# Patient Record
Sex: Male | Born: 1945 | Race: White | Hispanic: No | Marital: Married | Smoking: Former smoker
Health system: Southern US, Community
[De-identification: ages and names within clinical notes are randomized; demographics above are authoritative.]

## PROBLEM LIST (undated history)

## (undated) DIAGNOSIS — H269 Unspecified cataract: Secondary | ICD-10-CM

## (undated) DIAGNOSIS — E669 Obesity, unspecified: Secondary | ICD-10-CM

## (undated) DIAGNOSIS — I10 Essential (primary) hypertension: Secondary | ICD-10-CM

## (undated) DIAGNOSIS — T7840XA Allergy, unspecified, initial encounter: Secondary | ICD-10-CM

## (undated) DIAGNOSIS — N189 Chronic kidney disease, unspecified: Secondary | ICD-10-CM

## (undated) DIAGNOSIS — I714 Abdominal aortic aneurysm, without rupture, unspecified: Secondary | ICD-10-CM

## (undated) DIAGNOSIS — I2510 Atherosclerotic heart disease of native coronary artery without angina pectoris: Secondary | ICD-10-CM

## (undated) DIAGNOSIS — I4891 Unspecified atrial fibrillation: Principal | ICD-10-CM

## (undated) DIAGNOSIS — I219 Acute myocardial infarction, unspecified: Secondary | ICD-10-CM

## (undated) HISTORY — DX: Abdominal aortic aneurysm, without rupture, unspecified: I71.40

## (undated) HISTORY — DX: Essential (primary) hypertension: I10

## (undated) HISTORY — DX: Atherosclerotic heart disease of native coronary artery without angina pectoris: I25.10

## (undated) HISTORY — DX: Allergy, unspecified, initial encounter: T78.40XA

## (undated) HISTORY — DX: Abdominal aortic aneurysm, without rupture: I71.4

## (undated) HISTORY — DX: Acute myocardial infarction, unspecified: I21.9

## (undated) HISTORY — DX: Obesity, unspecified: E66.9

## (undated) HISTORY — DX: Chronic kidney disease, unspecified: N18.9

## (undated) HISTORY — DX: Unspecified cataract: H26.9

## (undated) HISTORY — DX: Unspecified atrial fibrillation: I48.91

---

## 1979-05-11 HISTORY — PX: SKIN GRAFT: SHX250

## 1997-11-25 ENCOUNTER — Ambulatory Visit: Admission: RE | Admit: 1997-11-25 | Discharge: 1997-11-25 | Payer: Self-pay | Admitting: Gastroenterology

## 1999-05-11 DIAGNOSIS — I219 Acute myocardial infarction, unspecified: Secondary | ICD-10-CM

## 1999-05-11 HISTORY — PX: CORONARY ANGIOPLASTY WITH STENT PLACEMENT: SHX49

## 1999-05-11 HISTORY — DX: Acute myocardial infarction, unspecified: I21.9

## 1999-05-16 ENCOUNTER — Inpatient Hospital Stay (HOSPITAL_COMMUNITY): Admission: EM | Admit: 1999-05-16 | Discharge: 1999-05-19 | Payer: Self-pay | Admitting: Emergency Medicine

## 1999-05-16 ENCOUNTER — Encounter: Payer: Self-pay | Admitting: Internal Medicine

## 2002-06-19 ENCOUNTER — Encounter: Admission: RE | Admit: 2002-06-19 | Discharge: 2002-09-17 | Payer: Self-pay | Admitting: Family Medicine

## 2004-05-10 HISTORY — PX: CARDIAC CATHETERIZATION: SHX172

## 2004-05-10 HISTORY — PX: HERNIA REPAIR: SHX51

## 2005-01-14 ENCOUNTER — Emergency Department (HOSPITAL_COMMUNITY): Admission: EM | Admit: 2005-01-14 | Discharge: 2005-01-14 | Payer: Self-pay | Admitting: Emergency Medicine

## 2005-02-01 ENCOUNTER — Ambulatory Visit: Payer: Self-pay | Admitting: Internal Medicine

## 2005-02-02 ENCOUNTER — Ambulatory Visit: Payer: Self-pay

## 2005-02-19 ENCOUNTER — Ambulatory Visit: Payer: Self-pay | Admitting: Cardiology

## 2005-02-19 ENCOUNTER — Ambulatory Visit: Payer: Self-pay

## 2005-02-25 ENCOUNTER — Ambulatory Visit: Payer: Self-pay | Admitting: Cardiology

## 2005-02-25 ENCOUNTER — Inpatient Hospital Stay (HOSPITAL_BASED_OUTPATIENT_CLINIC_OR_DEPARTMENT_OTHER): Admission: RE | Admit: 2005-02-25 | Discharge: 2005-02-25 | Payer: Self-pay | Admitting: Cardiology

## 2005-03-02 ENCOUNTER — Ambulatory Visit: Payer: Self-pay | Admitting: Internal Medicine

## 2005-04-06 ENCOUNTER — Encounter: Admission: RE | Admit: 2005-04-06 | Discharge: 2005-04-06 | Payer: Self-pay | Admitting: Surgery

## 2005-04-08 ENCOUNTER — Ambulatory Visit (HOSPITAL_BASED_OUTPATIENT_CLINIC_OR_DEPARTMENT_OTHER): Admission: RE | Admit: 2005-04-08 | Discharge: 2005-04-08 | Payer: Self-pay | Admitting: Surgery

## 2005-04-08 ENCOUNTER — Ambulatory Visit (HOSPITAL_COMMUNITY): Admission: RE | Admit: 2005-04-08 | Discharge: 2005-04-08 | Payer: Self-pay | Admitting: Surgery

## 2005-07-21 ENCOUNTER — Ambulatory Visit: Payer: Self-pay | Admitting: Cardiology

## 2006-07-21 ENCOUNTER — Ambulatory Visit: Payer: Self-pay | Admitting: Cardiology

## 2007-07-27 ENCOUNTER — Ambulatory Visit: Payer: Self-pay | Admitting: Cardiology

## 2008-07-06 DIAGNOSIS — E785 Hyperlipidemia, unspecified: Secondary | ICD-10-CM | POA: Insufficient documentation

## 2008-07-06 DIAGNOSIS — I2510 Atherosclerotic heart disease of native coronary artery without angina pectoris: Secondary | ICD-10-CM | POA: Insufficient documentation

## 2008-07-06 DIAGNOSIS — I10 Essential (primary) hypertension: Secondary | ICD-10-CM | POA: Insufficient documentation

## 2008-07-08 ENCOUNTER — Ambulatory Visit: Payer: Self-pay | Admitting: Cardiology

## 2009-06-16 ENCOUNTER — Encounter: Payer: Self-pay | Admitting: Cardiology

## 2009-07-11 ENCOUNTER — Ambulatory Visit: Payer: Self-pay | Admitting: Cardiology

## 2010-06-09 NOTE — Assessment & Plan Note (Signed)
Summary: 1 YR F/U   Visit Type:  Follow-up Primary Provider:  Hamricks  CC:  follow up.  History of Present Illness: Patient is 65 years old and returns for followup management of CAD. In 2001 he had an anterior wall MI treated with a bare-metal stent to the LAD. He underwent catheterization in 2006 and the stent was patent. An 80% narrowing the diagonal branch. His ejection fraction was 50%. He has done quite well since that time. He's had no recent chest pain shortness breath or palpitations.  His other problems include hypertension, hyperlipidemia, and diabetes., he recently had lab work at his primary care physician's office and his HDL was 47, his LDL was 53, his total cholesterol was 111. His hemoglobin A1c was 6.3.  He and his wife take care of their 58-year-old grandson and they're expecting another grandchild and may be involved with her care as well. He is retired from [employer].  Current Medications (verified): 1)  Simvastatin 40 Mg Tabs (Simvastatin) .... Take One Tablet By Mouth Daily At Bedtime 2)  Ramipril 10 Mg Caps (Ramipril) .... Take One Capsule By Mouth Daily 3)  Aspirin 81 Mg Tbec (Aspirin) .... Take One Tablet By Mouth Daily 4)  Nitrostat 0.4 Mg Subl (Nitroglycerin) .Marland Kitchen.. 1 Tablet Under Tongue At Onset of Chest Pain; You May Repeat Every 5 Minutes For Up To 3 Doses. 5)  Glipizide-Metformin Hcl 2.5-500 Mg Tabs (Glipizide-Metformin Hcl) .... 2 Tabs Two Times A Day 6)  Actos 45 Mg Tabs (Pioglitazone Hcl) .... Once Daily 7)  Levemir Flexpen 100 Unit/ml Soln (Insulin Detemir) .... As Directed  Allergies (verified): No Known Drug Allergies  Past History:  Past Medical History: Reviewed history from 07/06/2008 and no changes required. 1. Coronary artery disease status post anterior wall myocardial     infarction in 2001 treated with bare-metal stent to the LAD, now     stable with primarily nonobstructive disease at catheterization in     2006. 2. Estimated fraction  50%. 3. Hyperlipidemia. 4. Diabetes. 5. Hypertension.   Review of Systems       ROS is negative except as outlined in HPI.   Vital Signs:  Patient profile:   65 year old male Height:      74 inches Weight:      272 pounds BMI:     35.05 Pulse rate:   59 / minute BP sitting:   132 / 74  (left arm) Cuff size:   regular  Vitals Entered By: Hardin Negus, RMA (July 11, 2009 3:09 PM)  Physical Exam  Additional Exam:  Gen. Well-nourished, in no distress   Neck: No JVD, thyroid not enlarged, no carotid bruits Lungs: No tachypnea, clear without rales, rhonchi or wheezes Cardiovascular: Rhythm regular, PMI not displaced,  heart sounds  normal, no murmurs or gallops, no peripheral edema, pulses normal in all 4 extremities. Abdomen: BS normal, abdomen soft and non-tender without masses or organomegaly, no hepatosplenomegaly. MS: No deformities, no cyanosis or clubbing   Neuro:  No focal sns   Skin:  no lesions    Impression & Recommendations:  Problem # 1:  CAD, NATIVE VESSEL (ICD-414.01) He had an anterior MI in 2001 treated with a bare-metal stent to the LAD and he had mostly nonobstructive disease at 2006. His ejection fraction is 50%. He's had no recent chest pain and this problem appears stable.  His weight is up and talk to him about working on getting it back down.  His updated  medication list for this problem includes:    Ramipril 10 Mg Caps (Ramipril) .Marland Kitchen... Take one capsule by mouth daily    Aspirin 81 Mg Tbec (Aspirin) .Marland Kitchen... Take one tablet by mouth daily    Nitrostat 0.4 Mg Subl (Nitroglycerin) .Marland Kitchen... 1 tablet under tongue at onset of chest pain; you may repeat every 5 minutes for up to 3 doses.  Orders: EKG w/ Interpretation (93000)  Problem # 2:  HYPERTENSION, BENIGN (ICD-401.1)  Tappears well controlled on current medications.   His updated medication list for this problem includes:    Ramipril 10 Mg Caps (Ramipril) .Marland Kitchen... Take one capsule by mouth daily     Aspirin 81 Mg Tbec (Aspirin) .Marland Kitchen... Take one tablet by mouth daily  His updated medication list for this problem includes:    Ramipril 10 Mg Caps (Ramipril) .Marland Kitchen... Take one capsule by mouth daily    Aspirin 81 Mg Tbec (Aspirin) .Marland Kitchen... Take one tablet by mouth daily  Problem # 3:  HYPERLIPIDEMIA-MIXED (ICD-272.4) His recent lipid profile is excellent. We will continue his current therapy.   His updated medication list for this problem includes:    Simvastatin 40 Mg Tabs (Simvastatin) .Marland Kitchen... Take one tablet by mouth daily at bedtime  His updated medication list for this problem includes:    Simvastatin 40 Mg Tabs (Simvastatin) .Marland Kitchen... Take one tablet by mouth daily at bedtime  Patient Instructions: 1)  Your physician wants you to follow-up in: 1 year followup with Dr. Clifton James.  You will receive a reminder letter in the mail two months in advance. If you don't receive a letter, please call our office to schedule the follow-up appointment.

## 2010-07-20 ENCOUNTER — Ambulatory Visit (INDEPENDENT_AMBULATORY_CARE_PROVIDER_SITE_OTHER): Payer: BC Managed Care – PPO | Admitting: Cardiovascular Disease

## 2010-07-20 ENCOUNTER — Encounter: Payer: Self-pay | Admitting: Cardiovascular Disease

## 2010-07-20 DIAGNOSIS — I10 Essential (primary) hypertension: Secondary | ICD-10-CM

## 2010-07-20 DIAGNOSIS — I2510 Atherosclerotic heart disease of native coronary artery without angina pectoris: Secondary | ICD-10-CM

## 2010-07-28 NOTE — Assessment & Plan Note (Signed)
Summary: F1Y/LG   Visit Type:  Follow-up Primary Provider:  Burnell Blanks  CC:  no cardiac complaints..  History of Present Illness: 65 yo WM with history of CAD, HTN, hyperlipidemia and DM who is here today for routine cardiac follow up. He has been followed in the past by Dr. Juanda Chance.  In 2001 he had an anterior wall MI treated with a bare-metal stent to the LAD. He underwent catheterization in 2006 and the stent was patent. An 80% narrowing the diagonal branch. His ejection fraction was 50%.  He is here today for follow up. He is doing well. He has had no chest pain or SOB. No palpitations, near syncope or syncope.      Problems Prior to Update: 1)  Hyperlipidemia-mixed  (ICD-272.4) 2)  Hypertension, Benign  (ICD-401.1) 3)  Cad, Native Vessel  (ICD-414.01)  Current Medications (verified): 1)  Simvastatin 40 Mg Tabs (Simvastatin) .... Take One Tablet By Mouth Daily At Bedtime 2)  Ramipril 10 Mg Caps (Ramipril) .... Take One Capsule By Mouth Daily 3)  Aspirin 81 Mg Tbec (Aspirin) .... Take One Tablet By Mouth Daily 4)  Nitrostat 0.4 Mg Subl (Nitroglycerin) .Marland Kitchen.. 1 Tablet Under Tongue At Onset of Chest Pain; You May Repeat Every 5 Minutes For Up To 3 Doses. 5)  Glipizide-Metformin Hcl 2.5-500 Mg Tabs (Glipizide-Metformin Hcl) .... 2 Tabs Two Times A Day 6)  Actos 30 Mg Tabs (Pioglitazone Hcl) .... 1/2 Tablet Every Other Day 7)  Levemir Flexpen 100 Unit/ml Soln (Insulin Detemir) .... 40 Units Daily  Allergies: No Known Drug Allergies  Past History:  Past Medical History: 1. Coronary artery disease status post anterior wall myocardial     infarction in 2001 treated with bare-metal stent to the LAD, now     stable with primarily nonobstructive disease at catheterization in     2006. 2. Diabetes. 3. Hypertension.  Past Surgical History: Skin grafting  Family History: Father-deceased, age 42 heart problems Mother-deceased age 41  Lou Gehrigs disease 2 sisters alive. One  with DM.  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   Married. He and his wife are taking care of their 44-1/2-year-old grandson everyday which has been quite a Personal assistant. 3 children No tobacco currently. History of pipe smoking, none since 2001. No alcohol NO drugs  Review of Systems  The patient denies fatigue, malaise, fever, weight gain/loss, vision loss, decreased hearing, hoarseness, chest pain, palpitations, shortness of breath, prolonged cough, wheezing, sleep apnea, coughing up blood, abdominal pain, blood in stool, nausea, vomiting, diarrhea, heartburn, incontinence, blood in urine, muscle weakness, joint pain, leg swelling, rash, skin lesions, headache, fainting, dizziness, depression, anxiety, enlarged lymph nodes, easy bruising or bleeding, and environmental allergies.    Vital Signs:  Patient profile:   65 year old male Height:      74 inches Weight:      265 pounds BSA:     2.45 Resp:     14 per minute BP sitting:   122 / 70  (left arm)  Vitals Entered By: Laurance Flatten CMA (July 20, 2010 10:22 AM) CC: no cardiac complaints.   Physical Exam  General:  General: Well developed, well nourished, NAD HEENT: OP clear, mucus membranes moist SKIN: warm, dry Neuro: No focal deficits Musculoskeletal: Muscle strength 5/5 all ext Psychiatric: Mood and affect normal Neck: No JVD, no carotid bruits, no thyromegaly, no lymphadenopathy. Lungs:Clear bilaterally, no wheezes, rhonci,  crackles CV: RRR no murmurs, gallops rubs Abdomen: soft, NT, ND, BS present Extremities: No edema, pulses 2+.    EKG  Procedure date:  07/20/2010  Findings:      Sinus rhythm, rate 60 bpm.   Impression & Recommendations:  Problem # 1:  CAD, NATIVE VESSEL (ICD-414.01) Stable. No changes. No beta blocker secondary to bradycardia.   His updated medication list for this problem includes:    Ramipril 10 Mg Caps (Ramipril) .Marland Kitchen... Take  one capsule by mouth daily    Aspirin 81 Mg Tbec (Aspirin) .Marland Kitchen... Take one tablet by mouth daily    Nitrostat 0.4 Mg Subl (Nitroglycerin) .Marland Kitchen... 1 tablet under tongue at onset of chest pain; you may repeat every 5 minutes for up to 3 doses.  Problem # 2:  HYPERTENSION, BENIGN (ICD-401.1) Stable.   His updated medication list for this problem includes:    Ramipril 10 Mg Caps (Ramipril) .Marland Kitchen... Take one capsule by mouth daily    Aspirin 81 Mg Tbec (Aspirin) .Marland Kitchen... Take one tablet by mouth daily  Patient Instructions: 1)  Your physician recommends that you schedule a follow-up appointment in: 12 months.

## 2010-09-22 NOTE — Assessment & Plan Note (Signed)
[facility]                            CARDIOLOGY OFFICE NOTE   NAME:[name], [name]                       MRN:          [id number]  DATE:07/27/2007                            DOB:          [birth year]    PRIMARY CARE PHYSICIAN:  Lianne Bushy, M.D.   CLINICAL HISTORY:  [name] is 65 years old and returns for follow-up  management of coronary heart disease.  He had an anterior wall  myocardial infarction in 2001 treated with a bare-metal stent to LAD and  had nonobstructive disease at catheterization in 2006 with exception of  80% lesion in diagonal branch.  His ejection fraction was 50%.   He been doing quite well, has had no symptoms of chest pain, shortness  breath, or palpitations.  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   His past medical history is significant for hypertension,  hyperlipidemia, and diabetes.  He had lipid profile and hemoglobin A1c  recently by [name] which were quite good.   CURRENT MEDICATIONS:  Include:  1. Altace 10 mg daily.  2. Aspirin.  3. Actos.  4. Simvastatin 40 mg daily.  5. Januvia.  6. Levemir.  7. Metformin.   On examination today, the blood pressure was 112/67, pulse 59 and  regular.  There is no venous distension.  The carotid pulses were full without  bruits.  CHEST:  Was clear.  CARDIAC:  Rhythm was regular.  I could hear no murmurs or gallops.  ABDOMEN:  Was soft without organomegaly.  Peripheral pulses were full. There was no peripheral edema.   IMPRESSION:  1. Coronary artery disease status post anterior wall myocardial      infarction in 2001 treated with bare-metal stent to the LAD, now      stable with primarily nonobstructive disease at catheterization in      2006.  2. Estimated fraction 50%.  3. Hyperlipidemia.  4. Diabetes.  5. Hypertension.   RECOMMENDATIONS:  I think [name] is doing quite well.  His risk  factors appear to be under good  control with exception of his weight.  I  encouraged him to cut down on his weight  further and talked to him and reinforced the importance of a low-  carbohydrate diet.  Will plan to see him back in follow-up in a year.     Bruce Elvera Lennox Juanda Chance, MD, [facility]  Electronically Signed    BRB/MedQ  DD: 07/27/2007  DT: 07/28/2007  Job #: [id number]

## 2010-09-22 NOTE — Assessment & Plan Note (Signed)
[facility]                            CARDIOLOGY OFFICE NOTE   NAME:Malecha, BERTRUM HELMSTETTER                       MRN:          [id number]  DATE:07/08/2008                            DOB:          18-Aug-1945    PRIMARY CARE PHYSICIAN:  Burnell Blanks, MD, with [facility] in [place].   CLINICAL HISTORY:  Mr. Simkin is 65 years old and returned for followup  management of his coronary heart disease.  He had an anterior wall  infarction in 2001 treated with a bare-metal stent to the LAD.  His last  cath was in 2006, at which time he had 80% narrowing in diagonal branch  and nonobstructive disease elsewhere.  His ejection fraction is 50%.   He has had no recent chest pain, shortness of breath, or palpitations.   His past medical history is significant for hypertension,  hyperlipidemia, and diabetes.  He said his recent hemoglobin A1c was  7.0.   His current medications include:  1. Altace 10 mg daily.  2. Aspirin.  3. Actos.  4. Simvastatin 40 mg daily.  5. Levemir insulin.  6. Metformin.  7. Glipizide.   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  He and his wife are taking care of their 77-1/2-year-old  grandson everyday which has been quite a Personal assistant.   On examination, there was no venous distension.  The carotid pulses were  full without bruits.  Chest was clear.  Cardiac rhythm was regular.  I  could hear no murmurs or gallops.  The abdomen was soft without  organomegaly.  Peripheral pulses were full, and there was no peripheral  edema.   Electrocardiogram showed an old septal infarct.   IMPRESSION:  1. Coronary artery disease status post anterior wall myocardial      infarction in 2001 treated with a bare-metal stent to the left      anterior descending with primarily nonobstructive disease at      catheterization in 2006.  2. Ejection fraction 50%.  3. Hypertension.  4. Hyperlipidemia.  5. Diabetes.   RECOMMENDATIONS:  I think Mr. Seiter is doing well.  I encouraged him to  have regular physical activities.  Also, I encouraged him to cut back on  his weight.  We will plan to see him back in followup in a year.  We  will try to obtain his laboratory studies from Dr. Nathanial Rancher.     Bruce Elvera Lennox Juanda Chance, MD, [facility]  Electronically Signed    BRB/MedQ  DD: 07/08/2008  DT: 07/09/2008  Job #: [id number]

## 2010-09-25 NOTE — Assessment & Plan Note (Signed)
[facility]                            CARDIOLOGY OFFICE NOTE   NAME:[name], [name]                       MRN:          [id number]  DATE:07/21/2006                            DOB:          [birth year]    REFERRING PHYSICIAN:  Lianne Bushy, M.D.   CLINICAL HISTORY:  [name] is 65 years old, and returns for followup  management of his coronary heart disease.  In 2001 he had an anterior  infarction treated with a bare-metal stent to the LAD.  His last  catheterization was in October of 2006, at which time the stent was  widely patent, and he had an 80% lesion in the diagonal branch, and no  other major obstructive disease.  His ejection fraction was 50% with a  mild anterior wall motion abnormality.   He has done quite well, and had no recent chest pain, shortness of  breath, or palpitations.  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  He has had no symptoms  with this.   PAST MEDICAL HISTORY:  Significant for:  1. Hypertension.  2. Hyperlipidemia.  3. Diabetes.  His diabetes has recently gotten under much better      control with adjustment of his medications and the addition of      insulin.   CURRENT MEDICATIONS:  Include Altace, aspirin, Actos, simvastatin,  Januvia, metformin, and insulin.   EXAMINATION:  The blood pressure is 119/68.  Pulse 66 and regular.  There was no venous distention.  The carotid pulses were full without  bruits.  CHEST:  Was clear.  CARDIAC:  Rhythm was regular.  I could hear no murmurs or gallops.  ABDOMEN:  Soft with normal bowel sounds.  Peripheral pulses were full, and there was no peripheral edema.   An EKG was normal.   IMPRESSION:  1. Coronary artery disease status post anterior wall myocardial      infarction in 2001 treated with a bare-metal stent, now stable.  2. Ejection fraction 50%.  3. Hyperlipidemia.  4. Diabetes.  5. Hypertension.   RECOMMENDATIONS:  I think [name] is doing quite well.  I think the  main thing is his blood pressure and lipid profile have been quite good.  I think the main thing he needs to work on is losing weight and  increasing his exercise program.  I encouraged him in this regard.  He  is to  follow up with Dr. Purnell Shoemaker in May, and I asked him to forward the lipid  profile that he gets as part of that evaluation.  I will plan to see him  back in followup in a year.     Bruce Elvera Lennox Juanda Chance, MD, [facility]  Electronically Signed    BRB/MedQ  DD: 07/21/2006  DT: 07/23/2006  Job #: [id number]

## 2010-09-25 NOTE — Cardiovascular Report (Signed)
NAMEMALIK, PAAR NO.:  [id number]   MEDICAL RECORD NO.:  [id number]           PATIENT TYPE:   LOCATION:                                 FACILITY:   PHYSICIAN:  Charlies Constable, M.D. [facility]      DATE OF BIRTH:   DATE OF PROCEDURE:  02/25/2005  DATE OF DISCHARGE:                              CARDIAC CATHETERIZATION   CLINICAL HISTORY:  Mr. Swamy is 65 years old and in 2001 had an anterior  wall myocardial infarction treated with stenting in the proximal LAD.  He  also has hypertension, hyperlipidemia, and diabetes.  He was seen recently  by Ward Givens for preoperative evaluation prior to umbilical hernia repair.  A Cardiolite scan was obtained which suggested possible anterior ischemia.  For this reason he was brought in for catheterization in the outpatient  laboratory.  He has had no chest pain.   PROCEDURE:  The procedure was performed via the right femoral artery using  arterial sheath and 4-French preformed coronary catheters.  A front wall  arterial puncture was performed and Omnipaque contrast was used.  A distal  aortogram was performed to rule out abdominal aortic aneurysm.  Patient  tolerated the procedure well and left the laboratory in satisfactory  condition.   RESULTS:  The aortic pressure was 110/69 with a mean of 87.  Left  ventricular pressure was 110/13.   The left main coronary artery:  Free of significant disease.   Left anterior descending artery:  Gave rise to two septal perforators and a  small diagonal branch.  There was 0% stenosis at the stent site in the  ostium and proximal LAD.  There was 30% and 50% narrowing in the mid LAD.  There was 80% ostial stenosis in a small diagonal branch.   Circumflex artery:  Gave rise to a ramus branch, a marginal branch, and a  posterolateral branch.  These vessels were irregular but there was no  significant obstruction.   Right coronary artery:  Moderate sized vessel.  Gave rise to a conus  branch,  a right ventricular branch, and a large right ventricular branch that  supplied part of the inferior septum.  The right also gave rise to two short  posterior descending branches and a large and two small posterolateral  branches.  There were irregularities in the proximal and mid right coronary  artery and there was 30% narrowing in the mid to distal right coronary  artery.   LEFT VENTRICULOGRAM:  The left ventriculogram performed in the RAO  projection showed good wall motion.  There was mild hypokinesis at the apex.  The estimated ejection fraction was 50%.   DISTAL AORTOGRAM:  A distal aortogram was performed which showed patent  renal arteries.  There was some ectasia of the distal aorta, but no discrete  aneurysm.  There was no major aortoiliac obstruction.   CONCLUSIONS:  Coronary artery disease status post prior anterior wall  myocardial infarction in 2001 treated with bare metal stent with 0% stenosis  at the stent site at the ostium of the left anterior  descending, 50%  narrowing in the mid left anterior descending with 80% ostial narrowing in a  small diagonal branch, no significant obstruction of the circumflex artery,  30% narrowing in the mid to distal right coronary artery, and mild apical  hypokinesis with an estimated ejection fraction of 50%.   RECOMMENDATIONS:  The patient has no source of ischemia.  In view of these  findings I do not think the recent Cardiolite scan has any major  significance.  He has only non-obstructive coronary disease.  Will plan  continued medical management.  I think we can clear him for surgery with Dr.  Jamey Ripa in the near future.  I will plan a groin check in a week and I will  see him back in follow-up in a year.           ______________________________  Charlies Constable, M.D. [facility]     BB/MEDQ  D:  02/25/2005  T:  02/25/2005  Job:  [id number]   cc:   Lianne Bushy, M.D.  Fax: [phone]   Currie Paris, M.D.  [address]  [place]  [place] [address]

## 2010-09-25 NOTE — Op Note (Signed)
NAMEHARGIS, Richard                ACCOUNT NO.:  [id number]   MEDICAL RECORD NO.:  [id number]          PATIENT TYPE:  AMB   LOCATION:  DSC                          FACILITY:  [facility]   PHYSICIAN:  Currie Paris, M.D.DATE OF BIRTH:  03-May-1946   DATE OF PROCEDURE:  04/08/2005  DATE OF DISCHARGE:                                 OPERATIVE REPORT   OFFICE MEDICAL RECORD NUMBER:  [id number]   PREOPERATIVE DIAGNOSIS:  Umbilical hernia.   POSTOPERATIVE DIAGNOSIS:  Umbilical hernia.   OPERATION:  Repair of umbilical hernia with mesh.   SURGEON:  Currie Paris, M.D.   ANESTHESIA:  General.   CLINICAL HISTORY:  Mr. Odonovan is a 65 year old with an umbilical hernia that  was bothering him and he wished to have repaired.   DESCRIPTION OF PROCEDURE:  The patient was seen in the holding area and he  had no further questions.  We confirmed umbilical hernia repair as the  planned procedure.  He was taken to the operating room and after  satisfactory general (LMA) anesthesia had been obtained, the abdomen was  clipped, prepped and draped.  The time-out occurred.   I injected 0.25% plain Marcaine around the umbilical hernia.  The defect  presented a little bit at the top of the umbilicus.  I then made a  curvilinear incision around the base of the umbilicus.  We entered the sac  almost immediately and I elevated the skin off of the sac.  There was  omentum incarcerated through the hernia defect and I cleaned this off and  out of the subcutaneous pocket and reduced it.  We did not really excise the  sac, but just inverted it.  The defect was about 2 cm in diameter.  There  was no bowel that had been out in it as far as I could tell.   I took a piece of 3 x 6-inch Marlex mesh, cut it in half, folded it into a  shape of a cone and placed it into the defect and then closed the defect  with 3 sutures of 0 Prolene, incorporating the broad side or the top of the  cone with the mesh.  The  sutures tied down easily and with no tension.   Everything appeared to be dry.  Additional local was infiltrated and the  incision closed with some 3-0 Vicryl followed by 4-0 Monocryl subcuticular  and Dermabond.  Some cotton balls with mineral oil were placed in the  umbilicus to keep its shape.   The patient tolerated the procedure well.  There were no operative  complications.  All counts were correct.      Currie Paris, M.D.  Electronically Signed     CJS/MEDQ  D:  04/08/2005  T:  04/09/2005  Job:  [id number]   cc:   Lianne Bushy, M.D.  Fax: [phone]   Charlies Constable, M.D. [facility]  [address]  [address]  [place]  [place] [address]

## 2010-09-25 NOTE — Discharge Summary (Signed)
[place]  Patient:    [name]                        MRN: [id number] Adm. Date:  [id number] Disc. Date: 05/19/99 Attending:  Nathen May Dictator:   Joellyn Rued, P.A.C. CC:         Veneda Melter, M.D. [facility]             Rosana Fret, M.D., [place], [place]                  Referring Physician Discharge [place]  DATE OF BIRTH:  Apr 20, 1946.  ADMITTING PHYSICIAN:  Nathen May, M.D.  SUMMARY OF HISTORY:  Mr. Schurman is a 65 year old white male who developed new-onset left-sided substernal chest discomfort radiating to the neck and left arm, associated with shortness of breath and diaphoresis.  EKG showed acute anterior  myocardial infarction, thus, he was taken emergently to the catheterization lab. His risk factors are notable for positive family history and a skin graft on his hand in 1983.  He is a Sports coach and smokes a pipe.  LABORATORY AND X-RAY FINDINGS:  Initial CK was 97 with a troponin of 0.03; however, within four hours, CK total was 1917 with MB of 310 and a relative index of 5.4. Approximately eight hours after a second CK rose to a peak of 2515 with an MB of 388, with a relative index of 5.2, subsequent enzymes were declining.  LFTs were within normal limits.  Admission sodium was 142, potassium 3.7, BUN 21, creatinine 0.9.  PT 14.1, PTT 30.  Hemoglobin 15.0 and hematocrit 44.0 and normal indices,  platelets 156,000, WBC 11.5.  Chest x-ray showed a prominent aortic knob, density behind the heart, probably representing confluent vascular structures.  PA and lateral chest x-ray should e performed.  EKG on admission:  Sinus bradycardia, anterior ST segment elevation with reciprocal inferior ST segment depression.  HOSPITAL COURSE:  Mr. Goyne was taken emergently to the catheterization laboratory by Dr. Everardo Beals. Brodie.  This revealed a 30% mid-RCA with some irregularities,  99%  proximal LAD with thrombus and a 70% diagonal #1.  EF was 40-45% with anteroapical akinesis.  Angioplasty stenting to the LAD was performed, reducing a 99% lesion to 0% and thrombus had resolved.  Telemetry did show some nonsustained ventricular tachycardia and his lidocaine was eventually discontinued.  Post procedure ______ and bedrest, catheterization site was intact.  By May 19, 1999, he was ambulating without difficulty and it was felt that he could be discharged home with diagnoses of:  DISCHARGE DIAGNOSES: 1. Acute anterior myocardial infarction, status post angioplasty stenting of the    left anterior descending; catheterization report as previously described. 2. Sinus bradycardia.  DISPOSITION:  He is discharged home.  DISCHARGE MEDICATIONS: 1. Pravachol 20 mg q.p.m. 2. Lopressor 50 mg half a tablet b.i.d. 3. Plavix 75 mg q.d. for four weeks. 4. Enteric-coated aspirin 325 mg q.d. 5. Sublingual nitroglycerin as needed.  ACTIVITY:  He is advised no lifting, driving, sexual activity or heavy exertion  till seen by the physician.  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  SPECIAL INSTRUCTIONS:  He is advised to discontinued smoking his pipe.  DIET:  Maintain low-salt/fat/cholesterol diet.  WOUND CARE:  If he had any problems with his catheterization site, he was asked to call immediately.  FOLLOWUP:  He will have fasting lipids drawn at Dr. Chrys Racer office  in the morning and sent to our office.  He will see the P.A. at [facility] on Tuesday,  June 02, 1999, at 12 p.m. DD:  05/19/99 TD:  05/19/99 Job: [id number]

## 2011-07-26 ENCOUNTER — Encounter: Payer: Self-pay | Admitting: *Deleted

## 2011-07-27 ENCOUNTER — Encounter: Payer: Self-pay | Admitting: Cardiovascular Disease

## 2011-07-27 ENCOUNTER — Ambulatory Visit (INDEPENDENT_AMBULATORY_CARE_PROVIDER_SITE_OTHER): Payer: Medicare Other | Admitting: Cardiovascular Disease

## 2011-07-27 VITALS — BP 116/71 | HR 60 | Ht 73.0 in | Wt 254.0 lb

## 2011-07-27 DIAGNOSIS — I2510 Atherosclerotic heart disease of native coronary artery without angina pectoris: Secondary | ICD-10-CM

## 2011-07-27 DIAGNOSIS — I10 Essential (primary) hypertension: Secondary | ICD-10-CM

## 2011-07-27 NOTE — Assessment & Plan Note (Signed)
BP well controlled.

## 2011-07-27 NOTE — Patient Instructions (Signed)
Your physician wants you to follow-up in:  12 months.  You will receive a reminder letter in the mail two months in advance. If you don't receive a letter, please call our office to schedule the follow-up appointment.   

## 2011-07-27 NOTE — Assessment & Plan Note (Signed)
Stable. He had a stent placed in 2001. He has had no angina. Continue ASA, Ace-inhibitor and statin. Lipids are followed in primary care and controlled per pt. BP is at goal. No changes today. If he needs a vascular procedure, I do not think he will need further cardiac testing before that surgery.

## 2011-07-27 NOTE — Progress Notes (Signed)
History of Present Illness: 66 yo WM with history of CAD, HTN, hyperlipidemia and DM who is here today for routine cardiac follow up. He has been followed in the past by Dr. Juanda Chance. I last saw him in March 2012.  In 2001 he had an anterior wall MI treated with a bare-metal stent to the LAD. He underwent catheterization in 2006 and the stent was patent and there was an 80% narrowing in the diagonal branch. His ejection fraction was 50%.  He is here today for follow up. He is doing well. He has had no chest pain or SOB. No palpitations, near syncope or syncope. He has recently had hematuria and had a CT of his abdomen/pelvis that per the pt showed "some bulging of his aorta". He is not sure if this is a AAA.   Primary Care Physician: Burnell Blanks  Last Lipid Profile: Followed in primary care.  Past Medical History  Diagnosis Date  . Coronary artery disease     post bare-metal stent to LAD in 2001  . Diabetes mellitus   . Hypertension   . Myocardial infarct 2001    Past Surgical History  Procedure Date  . Coronary angioplasty with stent placement 2001    bare-metal stent to the LAD  . Cardiac catheterization 2006    stable with primarily nonobstructive disease  . Skin graft     Current Outpatient Prescriptions  Medication Sig Dispense Refill  . aspirin 81 MG tablet Take 81 mg by mouth daily.      Marland Kitchen atorvastatin (LIPITOR) 10 MG tablet Take 10 mg by mouth daily.      Marland Kitchen glipiZIDE-metformin (METAGLIP) 2.5-500 MG per tablet Take 2 tablets by mouth 2 (two) times daily before a meal.      . Insulin Detemir (LEVEMIR FLEXPEN [place]) Inject into the skin. As directed      . nitroGLYCERIN (NITROSTAT) 0.4 MG SL tablet Place 0.4 mg under the tongue every 5 (five) minutes as needed.      . ramipril (ALTACE) 10 MG capsule Take 10 mg by mouth daily.        No Known Allergies  History   Social History  . Marital Status: Married    Spouse Name: N/A    Number of Children: N/A  . Years of  Education: N/A   Occupational History  . Not on file.   Social History Main Topics  . Smoking status: Former Smoker    Types: Cigars    Quit date: 05/11/1999  . Smokeless tobacco: Not on file  . Alcohol Use: No  . Drug Use: Not on file  . Sexually Active: Not on file   Other Topics Concern  . Not on file   Social History Narrative  . No narrative on file    Family History  Problem Relation Age of Onset  . Heart disease Father   . ALS Mother   . Diabetes Sister   . Heart failure Brother     Review of Systems:  As stated in the HPI and otherwise negative.   BP 116/71  Pulse 60  Ht 6\' 1"  (1.854 m)  Wt 254 lb (115.214 kg)  BMI 33.51 kg/m2  Physical Examination: General: Well developed, well nourished, NAD HEENT: OP clear, mucus membranes moist SKIN: warm, dry. No rashes. Neuro: No focal deficits Musculoskeletal: Muscle strength 5/5 all ext Psychiatric: Mood and affect normal Neck: No JVD, no carotid bruits, no thyromegaly, no lymphadenopathy. Lungs:Clear bilaterally, no wheezes, rhonci, crackles  Cardiovascular: Regular rate and rhythm. No murmurs, gallops or rubs. Abdomen:Soft. Bowel sounds present. Non-tender.  Extremities: No lower extremity edema. Pulses are 2 + in the bilateral DP/PT.  EKG: NSR, rate 60 bpm. PACs. Possible old septal infarct.

## 2011-08-02 ENCOUNTER — Encounter: Payer: Self-pay | Admitting: Vascular Surgery

## 2011-08-03 ENCOUNTER — Ambulatory Visit (INDEPENDENT_AMBULATORY_CARE_PROVIDER_SITE_OTHER): Payer: Medicare Other | Admitting: Vascular Surgery

## 2011-08-03 ENCOUNTER — Encounter: Payer: Self-pay | Admitting: Vascular Surgery

## 2011-08-03 VITALS — BP 127/64 | HR 78 | Resp 20 | Ht 75.0 in | Wt 258.0 lb

## 2011-08-03 DIAGNOSIS — I714 Abdominal aortic aneurysm, without rupture: Secondary | ICD-10-CM

## 2011-08-03 NOTE — Progress Notes (Signed)
[facility]  Vascular Consult Note    Patient name: [name] MRN: [id number] DOB: 1946/02/22 Sex: male   Referred by: Bjorn Pippin  Reason for referral: AAA  HISTORY OF PRESENT ILLNESS: Patient is a 66 year old gentleman who underwent a CT scan for workup of hematuria. He had an incidental finding of a 3.6 cm infrarenal abdominal aortic aneurysm. 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 He had no known knowledge of this aneurysm. He has no symptoms of the aneurysm. He does have a history of prior myocardial infarction 2001 and had a bare-metal stent placed that time. He has been stable since then.  Past Medical History  Diagnosis Date  . Coronary artery disease     post bare-metal stent to LAD in 2001  . Diabetes mellitus   . Hypertension   . Myocardial infarct 2001    Past Surgical History  Procedure Date  . Coronary angioplasty with stent placement 2001    bare-metal stent to the LAD  . Cardiac catheterization 2006    stable with primarily nonobstructive disease  . Skin graft   . Hernia repair     History   Social History  . Marital Status: Married    Spouse Name: N/A    Number of Children: N/A  . Years of Education: N/A   Occupational History  . Not on file.   Social History Main Topics  . Smoking status: Former Smoker -- 0.5 packs/day for 40 years    Types: Cigarettes, Pipe, Cigars    Quit date: 05/11/1999  . Smokeless tobacco: Never Used  . Alcohol Use: No  . Drug Use: No  . Sexually Active: Not on file   Other Topics Concern  . Not on file   Social History Narrative  . No narrative on file    Family History  Problem Relation Age of Onset  . Heart disease Father   . ALS Mother   . Diabetes Sister   . Heart failure Brother     No Known Allergies  Prior to Admission medications   Medication Sig Start Date End Date Taking? Authorizing Provider  aspirin 81 MG tablet Take 81 mg by  mouth daily.   Yes Historical Provider, MD  atorvastatin (LIPITOR) 10 MG tablet Take 10 mg by mouth daily.   Yes Historical Provider, MD  glipiZIDE-metformin (METAGLIP) 2.5-500 MG per tablet Take 2 tablets by mouth 2 (two) times daily before a meal.   Yes Historical Provider, MD  Insulin Detemir (LEVEMIR FLEXPEN [place]) Inject into the skin. As directed   Yes Historical Provider, MD  nitroGLYCERIN (NITROSTAT) 0.4 MG SL tablet Place 0.4 mg under the tongue every 5 (five) minutes as needed.   Yes Historical Provider, MD  ramipril (ALTACE) 10 MG capsule Take 10 mg by mouth daily.   Yes Historical Provider, MD     REVIEW OF SYSTEMS: Cardiovascular: No chest pain, chest pressure, palpitations, orthopnea, or dyspnea on exertion. No claudication or rest pain,  No history of DVT or phlebitis. Pulmonary: No productive cough, asthma or wheezing. Neurologic: No weakness, paresthesias, aphasia, or amaurosis. No dizziness. Hematologic: No bleeding problems or clotting disorders. Musculoskeletal: No joint pain or joint swelling. Gastrointestinal: No blood in stool or hematemesis Genitourinary: No dysuria. He does have hematuria Psychiatric:: No history of major depression. Integumentary: No rashes or ulcers. Constitutional: No fever or chills.  PHYSICAL EXAMINATION:  Filed Vitals:   08/03/11 1354  BP: 127/64  Pulse: 78  Resp: 20    General: The patient appears their stated age. Pulmonary: There is a good air exchange bilaterally without wheezing or rales. Abdomen: Soft and non-tender with normal pitch bowel sounds. No aneurysm palpable Musculoskeletal: There are no major deformities.  There is no significant extremity pain. Neurologic: No focal weakness or paresthesias are detected, Skin: There are no ulcer or rashes noted. Psychiatric: The patient has normal affect. Cardiovascular: There is a regular rate and rhythm without significant murmur appreciated. Pulse status: Prominent 3+ radial  femoral popliteal and dorsalis pedis pulses bilaterally. No evidence of peripheral aneurysm. Carotid arteries without bruits.    Outside Studies/Documentation Historical records were reviewed.  They showed CT scan results reviewed showing a 3.6 cm infrarenal abdominal aortic aneurysm in 1.7 cm right common iliac artery ectasia.  Medication Changes: None  Assessment:  Small infrarenal abdominal aortic aneurysm  Plan: I discussed this at length with the patient and his wife present. I explained that there is essentially no risk for rupture of the small size. I did explain the natural history of continued growth of the aneurysm over time. I explained that a repair both open and stent graft for aneurysms that it reaches 5-5.5 cm. Explained symptoms of rupture the patient knows to present immediately to emerge from should this occur. I explained he can e continues to have no limitations of activity and does require good blood pressure control. We will see him in 6 months for ultrasound to rule out increase in size. Assuming this is continues to be small we will draw back to yearly ultrasound  Shantea Poulton 3/26/20132:42 PM

## 2011-08-31 ENCOUNTER — Encounter: Payer: Self-pay | Admitting: Cardiovascular Disease

## 2012-01-18 ENCOUNTER — Other Ambulatory Visit: Payer: Self-pay | Admitting: *Deleted

## 2012-01-18 DIAGNOSIS — I714 Abdominal aortic aneurysm, without rupture: Secondary | ICD-10-CM

## 2012-01-24 ENCOUNTER — Encounter: Payer: Self-pay | Admitting: Vascular Surgery

## 2012-01-25 ENCOUNTER — Encounter: Payer: Self-pay | Admitting: Neurosurgery

## 2012-01-25 ENCOUNTER — Encounter (INDEPENDENT_AMBULATORY_CARE_PROVIDER_SITE_OTHER): Payer: Medicare Other | Admitting: *Deleted

## 2012-01-25 ENCOUNTER — Ambulatory Visit (INDEPENDENT_AMBULATORY_CARE_PROVIDER_SITE_OTHER): Payer: Medicare Other | Admitting: Neurosurgery

## 2012-01-25 VITALS — BP 113/84 | HR 76 | Resp 16 | Ht 75.0 in | Wt 258.5 lb

## 2012-01-25 DIAGNOSIS — I714 Abdominal aortic aneurysm, without rupture, unspecified: Secondary | ICD-10-CM

## 2012-01-25 NOTE — Progress Notes (Signed)
[facility] [place] AAA/PAD/PVD Office Note  CC: AAA surveillance Referring Physician: Early  History of Present Illness: Is is a 66 year old male patient of Dr. Arbie Cookey is followed for known small AAA. The patient denies any unusual abdominal or back pain or any new medical diagnoses or recent surgery.  Past Medical History  Diagnosis Date  . Coronary artery disease     post bare-metal stent to LAD in 2001  . Diabetes mellitus   . Hypertension   . Myocardial infarct 2001    ROS: [x]  Positive   [ ]  Denies    General: [ ]  Weight loss, [ ]  Fever, [ ]  chills Neurologic: [ ]  Dizziness, [ ]  Blackouts, [ ]  Seizure [ ]  Stroke, [ ]  "Mini stroke", [ ]  Slurred speech, [ ]  Temporary blindness; [ ]  weakness in arms or legs, [ ]  Hoarseness Cardiac: [ ]  Chest pain/pressure, [ ]  Shortness of breath at rest [ ]  Shortness of breath with exertion, [ ]  Atrial fibrillation or irregular heartbeat Vascular: [ ]  Pain in legs with walking, [ ]  Pain in legs at rest, [ ]  Pain in legs at night,  [ ]  Non-healing ulcer, [ ]  Blood clot in vein/DVT,   Pulmonary: [ ]  Home oxygen, [ ]  Productive cough, [ ]  Coughing up blood, [ ]  Asthma,  [ ]  Wheezing Musculoskeletal:  [ ]  Arthritis, [ ]  Low back pain, [ ]  Joint pain Hematologic: [ ]  Easy Bruising, [ ]  Anemia; [ ]  Hepatitis Gastrointestinal: [ ]  Blood in stool, [ ]  Gastroesophageal Reflux/heartburn, [ ]  Trouble swallowing Urinary: [ ]  chronic Kidney disease, [ ]  on HD - [ ]  MWF or [ ]  TTHS, [ ]  Burning with urination, [ ]  Difficulty urinating Skin: [ ]  Rashes, [ ]  Wounds Psychological: [ ]  Anxiety, [ ]  Depression   Social History History  Substance Use Topics  . Smoking status: Former Smoker -- 0.5 packs/day for 40 years    Types: Cigarettes, Pipe, Cigars    Quit date: 05/11/1999  . Smokeless tobacco: Never Used  . Alcohol Use: No    Family History Family History  Problem Relation Age of Onset  . Heart disease Father   . ALS Mother    . Diabetes Sister   . Heart failure Brother     No Known Allergies  Current Outpatient Prescriptions  Medication Sig Dispense Refill  . aspirin 81 MG tablet Take 81 mg by mouth daily.      Marland Kitchen atorvastatin (LIPITOR) 10 MG tablet Take 10 mg by mouth daily.      Marland Kitchen glipiZIDE-metformin (METAGLIP) 2.5-500 MG per tablet Take 2 tablets by mouth 2 (two) times daily before a meal.      . Insulin Detemir (LEVEMIR FLEXPEN [place]) Inject into the skin. As directed      . nitroGLYCERIN (NITROSTAT) 0.4 MG SL tablet Place 0.4 mg under the tongue every 5 (five) minutes as needed.      Marland Kitchen omeprazole (PRILOSEC) 20 MG capsule Take 20 mg by mouth daily.      . ramipril (ALTACE) 10 MG capsule Take 10 mg by mouth daily.        Physical Examination  Filed Vitals:   01/25/12 1001  BP: 113/84  Pulse: 76  Resp: 16    Body mass index is 32.31 kg/(m^2).  General:  WDWN in NAD Gait: Normal HEENT: WNL Eyes: Pupils equal Pulmonary: normal non-labored breathing , without Rales, rhonchi,  wheezing Cardiac: RRR, without  Murmurs, rubs or  gallops; No carotid bruits Abdomen: soft, NT, no masses Skin: no rashes, ulcers noted Vascular Exam/Pulses: Palpable femoral pulses bilaterally, there is no AAA palpable due to the the AAA size and body habitus  Extremities without ischemic changes, no Gangrene , no cellulitis; no open wounds;  Musculoskeletal: no muscle wasting or atrophy  Neurologic: A&O X 3; Appropriate Affect ; SENSATION: normal; MOTOR FUNCTION:  moving all extremities equally. Speech is fluent/normal  Non-Invasive Vascular Imaging: AAA duplex today shows a maximum diameter of 3.4 which is virtually unchanged from previous exam in February 2013 when it was 3.6 x 3.2  ASSESSMENT/PLAN: Asymptomatic patient with small AAA. The patient will followup in one year with repeat AAA duplex. The patient's questions were encouraged and answered, he is in agreement with this plan.  Lauree Chandler ANP  Clinic M.D.:  Early

## 2012-02-08 DIAGNOSIS — I4891 Unspecified atrial fibrillation: Secondary | ICD-10-CM

## 2012-02-08 HISTORY — DX: Unspecified atrial fibrillation: I48.91

## 2012-02-25 ENCOUNTER — Encounter: Payer: Self-pay | Admitting: Nurse Practitioner

## 2012-02-25 ENCOUNTER — Ambulatory Visit (INDEPENDENT_AMBULATORY_CARE_PROVIDER_SITE_OTHER): Payer: Medicare Other | Admitting: Nurse Practitioner

## 2012-02-25 VITALS — BP 128/78 | HR 72 | Ht 75.0 in | Wt 262.0 lb

## 2012-02-25 DIAGNOSIS — I4891 Unspecified atrial fibrillation: Secondary | ICD-10-CM

## 2012-02-25 NOTE — Progress Notes (Signed)
Gwyndolyn Saxon Date of Birth: 27-Sep-1945 Medical Record #[id number]  History of Present Illness: Mr. Niehoff is seen today for a work in visit. He is seen for Dr. Clifton James. He has known CAD with remote MI prior PCI with BMS to the LAD. He had a cath in 2006 and his stent was patent at that time. He did have an 80% narrowing the diagonal with an EF of 50%. His other issues include HTN, HLD and DM. No record of past echo noted.   He was referred here from his PCP with new onset atrial fib. He was there just for his regular follow up visit.  Xarelto was started. He did not seem to be symptomatic. His labs were checked and notable for an A1C of 8.7. TSH was normal.  He comes in today. He is here with his wife. He is feeling ok. He has no symptoms whatsoever. He is not having chest pain. Not short of breath. No palpitations. No dizziness. No syncope. His energy level has been fine. He says that he was told his heart was irregular when he was at [facility] for his ultrasound earlier this year. The duration of his atrial fib is unknown. He notes that he had some upset stomach a few hours after his first dose of Xarelto but none since. He has had 2 doses. He has a brother who has atrial fib. His CHADs would be 2 (HTN & DM). No prior stroke. No CHF. He is 66 years of age.   Current Outpatient Prescriptions on File Prior to Visit  Medication Sig Dispense Refill  . aspirin 81 MG tablet Take 81 mg by mouth daily.      Marland Kitchen atorvastatin (LIPITOR) 10 MG tablet Take 10 mg by mouth 2 (two) times daily.       Marland Kitchen glipiZIDE-metformin (METAGLIP) 2.5-500 MG per tablet Take 2 tablets by mouth 2 (two) times daily before a meal.      . Insulin Detemir (LEVEMIR FLEXPEN [place]) Inject 50 Units into the skin. As directed      . nitroGLYCERIN (NITROSTAT) 0.4 MG SL tablet Place 0.4 mg under the tongue every 5 (five) minutes as needed.      Marland Kitchen omeprazole (PRILOSEC) 20 MG capsule Take 20 mg by mouth daily.      . ramipril (ALTACE) 10 MG  capsule Take 10 mg by mouth daily.      . rivaroxaban (XARELTO) 10 MG TABS tablet Take 20 mg by mouth daily.        No Known Allergies  Past Medical History  Diagnosis Date  . Coronary artery disease     post bare-metal stent to LAD in 2001  . Diabetes mellitus   . Hypertension   . Myocardial infarct 2001  . Atrial fibrillation October 2013    Xarelto started; duration unknown    Past Surgical History  Procedure Date  . Coronary angioplasty with stent placement 2001    bare-metal stent to the LAD  . Cardiac catheterization 2006    stable with primarily nonobstructive disease  . Skin graft   . Hernia repair     History  Smoking status  . Former Smoker -- 0.5 packs/day for 40 years  . Types: Cigarettes, Pipe, Cigars  . Quit date: 05/11/1999  Smokeless tobacco  . Never Used    History  Alcohol Use No    Family History  Problem Relation Age of Onset  . Heart disease Father   . ALS Mother   .  Diabetes Sister   . Heart failure Brother     Review of Systems: The review of systems is per the HPI.  All other systems were reviewed and are negative.  Physical Exam: BP 128/78  Pulse 72  Ht 6\' 3"  (1.905 m)  Wt 262 lb (118.842 kg)  BMI 32.75 kg/m2 Patient is very pleasant and in no acute distress. He is obese. Skin is warm and dry. Color is normal.  HEENT is unremarkable. Normocephalic/atraumatic. PERRL. Sclera are nonicteric. Neck is supple. No masses. No JVD. Lungs are clear. Cardiac exam shows an irregular rhythm. His rate is controlled. Abdomen is soft. Extremities are without edema. Gait and ROM are intact. No gross neurologic deficits noted.  LABORATORY DATA: Records from Dr. Nathanial Rancher are reviewed. EKG showed atrial fib. TSH was normal. Chemistries were normal.    Assessment / Plan: 1. Atrial fib - duration unknown - his rate is controlled. He is not symptomatic. He is on Xarelto. Hopefully he will be able to tolerate. We will arrange for an echocardiogram. I  will see him back in about 3 weeks. Probably proceed on with one attempt at cardioversion if he desires. His rate is controlled. I have left him on his current regimen. Will stop his aspirin this weekend. He is to use Tylenol prn and not NSAIDs.  2. CAD - no chest pain.   3. HTN - blood pressure looks good.  4. HLD - Lipitor has recently been increased per his PCP.   4. DM - A1C is up. Plan per his PCP.  Patient is agreeable to this plan and will call if any problems develop in the interim.

## 2012-02-25 NOTE — Patient Instructions (Addendum)
You have atrial fibrillation  Stay on your current medicines   We need to get an ultrasound of your heart  I will see you in about 3 weeks with a repeat EKG  Call the [facility] office at [phone] if you have any questions, problems or concerns.

## 2012-03-02 ENCOUNTER — Ambulatory Visit (HOSPITAL_COMMUNITY): Payer: Medicare Other | Attending: Cardiovascular Disease

## 2012-03-02 DIAGNOSIS — I369 Nonrheumatic tricuspid valve disorder, unspecified: Secondary | ICD-10-CM | POA: Insufficient documentation

## 2012-03-02 DIAGNOSIS — I2510 Atherosclerotic heart disease of native coronary artery without angina pectoris: Secondary | ICD-10-CM | POA: Insufficient documentation

## 2012-03-02 DIAGNOSIS — I059 Rheumatic mitral valve disease, unspecified: Secondary | ICD-10-CM | POA: Insufficient documentation

## 2012-03-02 DIAGNOSIS — I4891 Unspecified atrial fibrillation: Secondary | ICD-10-CM | POA: Insufficient documentation

## 2012-03-02 DIAGNOSIS — E119 Type 2 diabetes mellitus without complications: Secondary | ICD-10-CM | POA: Insufficient documentation

## 2012-03-02 DIAGNOSIS — I10 Essential (primary) hypertension: Secondary | ICD-10-CM | POA: Insufficient documentation

## 2012-03-02 NOTE — Progress Notes (Signed)
Echocardiogram performed.  

## 2012-03-06 ENCOUNTER — Telehealth: Payer: Self-pay | Admitting: Cardiovascular Disease

## 2012-03-06 MED ORDER — RIVAROXABAN 20 MG PO TABS: 20.0000 mg | ORAL_TABLET | Freq: Every day | ORAL | Status: DC

## 2012-03-06 NOTE — Telephone Encounter (Signed)
Spoke with pt's wife. Wife asking if pt should continue Xarelto. Reviewed with Sunday Spillers. Echo report done 03/02/12 reviewed by Lawson Fiscal.  Pt to continue Xarelto. Pt aware he is to continue Xarelto.

## 2012-03-06 NOTE — Telephone Encounter (Signed)
plz return call to pt [phone] regarding questions about Xarelto Medication

## 2012-03-07 ENCOUNTER — Encounter: Payer: Self-pay | Admitting: Nurse Practitioner

## 2012-03-21 ENCOUNTER — Ambulatory Visit
Admission: RE | Admit: 2012-03-21 | Discharge: 2012-03-21 | Disposition: A | Discharge status: Home / Self Care | Payer: Medicare Other | Source: Ambulatory Visit | Attending: Nurse Practitioner | Admitting: Nurse Practitioner

## 2012-03-21 ENCOUNTER — Ambulatory Visit (INDEPENDENT_AMBULATORY_CARE_PROVIDER_SITE_OTHER): Payer: Medicare Other | Admitting: Nurse Practitioner

## 2012-03-21 ENCOUNTER — Encounter: Payer: Self-pay | Admitting: Nurse Practitioner

## 2012-03-21 ENCOUNTER — Other Ambulatory Visit: Payer: Self-pay | Admitting: Nurse Practitioner

## 2012-03-21 VITALS — BP 130/70 | HR 112 | Ht 75.0 in | Wt 264.8 lb

## 2012-03-21 DIAGNOSIS — Z01810 Encounter for preprocedural cardiovascular examination: Secondary | ICD-10-CM

## 2012-03-21 DIAGNOSIS — I4891 Unspecified atrial fibrillation: Secondary | ICD-10-CM

## 2012-03-21 LAB — CBC WITH DIFFERENTIAL/PLATELET
Basophils Absolute: 0 10*3/uL (ref 0.0–0.1)
Basophils Relative: 0.7 % (ref 0.0–3.0)
Eosinophils Absolute: 0.2 10*3/uL (ref 0.0–0.7)
Eosinophils Relative: 3.3 % (ref 0.0–5.0)
HCT: 41.1 % (ref 39.0–52.0)
Hemoglobin: 13.6 g/dL (ref 13.0–17.0)
Lymphocytes Relative: 23.6 % (ref 12.0–46.0)
Lymphs Abs: 1.6 10*3/uL (ref 0.7–4.0)
MCHC: 33 g/dL (ref 30.0–36.0)
MCV: 93 fl (ref 78.0–100.0)
Monocytes Absolute: 0.6 10*3/uL (ref 0.1–1.0)
Monocytes Relative: 9.5 % (ref 3.0–12.0)
Neutro Abs: 4.3 10*3/uL (ref 1.4–7.7)
Neutrophils Relative %: 62.9 % (ref 43.0–77.0)
Platelets: 144 10*3/uL — ABNORMAL LOW (ref 150.0–400.0)
RBC: 4.42 Mil/uL (ref 4.22–5.81)
RDW: 13.8 % (ref 11.5–14.6)
WBC: 6.8 10*3/uL (ref 4.5–10.5)

## 2012-03-21 LAB — BASIC METABOLIC PANEL
BUN: 20 mg/dL (ref 6–23)
CO2: 21 mEq/L (ref 19–32)
Calcium: 8.9 mg/dL (ref 8.4–10.5)
Chloride: 105 mEq/L (ref 96–112)
Creatinine, Ser: 1.3 mg/dL (ref 0.4–1.5)
GFR: 58.09 mL/min — ABNORMAL LOW (ref >60.00)
Glucose, Bld: 221 mg/dL — ABNORMAL HIGH (ref 70–99)
Potassium: 4.4 mEq/L (ref 3.5–5.1)
Sodium: 136 mEq/L (ref 135–145)

## 2012-03-21 LAB — PROTIME-INR
INR: 1.4 ratio — ABNORMAL HIGH (ref 0.8–1.0)
Prothrombin Time: 14.6 s — ABNORMAL HIGH (ref 10.2–12.4)

## 2012-03-21 LAB — APTT: aPTT: 36.3 s — ABNORMAL HIGH (ref 21.7–28.8)

## 2012-03-21 MED ORDER — METOPROLOL TARTRATE 25 MG PO TABS: 25.0000 mg | ORAL_TABLET | Freq: Two times a day (BID) | ORAL | Status: DC

## 2012-03-21 NOTE — Patient Instructions (Addendum)
You are still out of rhythm  We are going to arrange for a cardioversion at the hospital and try to get you back in a regular rhythm  I am starting you on Lopressor 25 mg to take two times a day. This is at the drug store  We need to check labs here today. We need for you to go to [facility] to [facility] on the first floor for a chest Xray. You may walk in.  Take only 25 units of insulin on Sunday Night before your procedure and HOLD GLIPIZIDE/METFORMIN THE DAY OF YOUR PROCEDURE Stay on your other medicines including your Xarelto  You are scheduled for a cardioversion on Monday, November 18th at 2pm  with Dr.Katz  or associates. Please go to [facility] next Monday, check in at the Short Stay Desk and then go to the 3rd floor. Arrive at 1pm Do not have any food or drink after midnight on Sunday.  You may take your medicines with a sip of water on the day of your procedure.  You will need someone to drive you home following your procedure.   Call the [facility] office at [phone] if you have any questions, problems or concerns.     Electrical Cardioversion Cardioversion is the delivery of a jolt of electricity to change the rhythm of the heart. Sticky patches or metal paddles are placed on the chest to deliver the electricity from a special device. This is done to restore a normal rhythm. A rhythm that is too fast or not regular keeps the heart from pumping well. Compared to medicines used to change an abnormal rhythm, cardioversion is faster and works better. It is also unpleasant and may dislodge blood clots from the heart. WHEN WOULD THIS BE DONE?  In an emergency:  There is low or no blood pressure as a result of the heart rhythm.  Normal rhythm must be restored as fast as possible to protect the brain and heart from further damage.  It may save a life.  For less serious heart rhythms, such as atrial fibrillation or flutter, in which:  The heart is  beating too fast or is not regular.  The heart is still able to pump enough blood, but not as well as it should.  Medicine to change the rhythm has not worked.  It is safe to wait in order to allow time for preparation. LET YOUR CAREGIVER KNOW ABOUT:   Every medicine you are taking. It is very important to do this! Know when to take or stop taking any of them.  Any time in the past that you have felt your heart was not beating normally. RISKS AND COMPLICATIONS   Clots may form in the chambers of the heart if it is beating too fast. These clots may be dislodged during the procedure and travel to other parts of the body.  There is risk of a stroke during and after the procedure if a clot moves. Blood thinners lower this risk.  You may have a special test of your heart (TEE) to make sure there are no clots in your heart. BEFORE THE PROCEDURE   You may have some tests to see how well your heart is working.  You may start taking blood thinners so your blood does not clot as easily.  Other drugs may be given to help your heart work better. PROCEDURE (SCHEDULED)  The procedure is typically done in a hospital by a heart doctor (cardiologist).  You will be told when and where to go.  You may be given some medicine through an intravenous (IV) access to reduce discomfort and make you sleepy before the procedure.  Your whole body may move when the shock is delivered. Your chest may feel sore.  You may be able to go home after a few hours. Your heart rhythm will be watched to make sure it does not change. HOME CARE INSTRUCTIONS   Only take medicine as directed by your caregiver. Be sure you understand how and when to take your medicine.  Learn how to feel your pulse and check it often.  Limit your activity for 48 hours.  Avoid caffeine and other stimulants as directed. SEEK MEDICAL CARE IF:   You feel like your heart is beating too fast or your pulse is not regular.  You have any  questions about your medicines.  You have bleeding that will not stop. SEEK IMMEDIATE MEDICAL CARE IF:   You are dizzy or feel faint.  It is hard to breathe or you feel short of breath.  There is a change in discomfort in your chest.  Your speech is slurred or you have trouble moving your arm or leg on one side.  You get a muscle cramp.  Your fingers or toes turn cold or blue. MAKE SURE YOU:   Understand these instructions.  Will watch your condition.  Will get help right away if you are not doing well or get worse. Document Released: 04/16/2002 Document Revised: 07/19/2011 Document Reviewed: 08/16/2007 [facility] Patient Information 2013 [place], [place].

## 2012-03-21 NOTE — Progress Notes (Signed)
[name] Date of Birth: 1945/07/15 Medical Record #[id number]  History of Present Illness: [name] is seen back today for a 3 week check. He is seen for Dr. Clifton James. He has known CAD with remote MI, prior PCI with BMS to the LAD. He had a cath in 2006 and his stent was patent at that time. Did have an 80% narrowning in the diagonal with an EF of 50% then. His other issues include HTN, HLD and DM. He has a newly found AAA that measured 3.4 x 3.4 back in September. He reports a tendency towards bradycardia and has been on beta blockers in the remote past.   He comes in today. He is here with his wife. He is doing well. No awareness of his atrial fib. Not dizzy or lightheaded. No syncope. No chest pain. No shortness of breath. Tolerating his Xarelto. Has not missed any doses.   Current Outpatient Prescriptions on File Prior to Visit  Medication Sig Dispense Refill  . atorvastatin (LIPITOR) 10 MG tablet Take 10 mg by mouth 2 (two) times daily.       Marland Kitchen glipiZIDE-metformin (METAGLIP) 2.5-500 MG per tablet Take 2 tablets by mouth 2 (two) times daily before a meal.      . Insulin Detemir (LEVEMIR FLEXPEN [place]) Inject 50 Units into the skin. As directed      . nitroGLYCERIN (NITROSTAT) 0.4 MG SL tablet Place 0.4 mg under the tongue every 5 (five) minutes as needed.      Marland Kitchen omeprazole (PRILOSEC) 20 MG capsule Take 20 mg by mouth daily.      . ramipril (ALTACE) 10 MG capsule Take 10 mg by mouth daily.      . Rivaroxaban (XARELTO) 20 MG TABS Take 1 tablet (20 mg total) by mouth daily.  90 tablet  0    No Known Allergies  Past Medical History  Diagnosis Date  . Coronary artery disease     post bare-metal stent to LAD in 2001  . Diabetes mellitus   . Hypertension   . Myocardial infarct 2001  . Atrial fibrillation October 2013    Xarelto started; duration unknown  . Obesity     Past Surgical History  Procedure Date  . Coronary angioplasty with stent placement 2001    bare-metal stent to  the LAD  . Cardiac catheterization 2006    stable with primarily nonobstructive disease  . Skin graft   . Hernia repair     History  Smoking status  . Former Smoker -- 0.5 packs/day for 40 years  . Types: Cigarettes, Pipe, Cigars  . Quit date: 05/11/1999  Smokeless tobacco  . Never Used    History  Alcohol Use No    Family History  Problem Relation Age of Onset  . Heart disease Father   . ALS Mother   . Diabetes Sister   . Heart failure Brother     Review of Systems: The review of systems is per the HPI.  All other systems were reviewed and are negative.  Physical Exam: BP 130/70  Pulse 64  Ht 6\' 3"  (1.905 m)  Wt 264 lb 12.8 oz (120.112 kg)  BMI 33.10 kg/m2 Patient is very pleasant and in no acute distress. He is obese. Skin is warm and dry. Color is normal.  HEENT is unremarkable. Normocephalic/atraumatic. PERRL. Sclera are nonicteric. Neck is supple. No masses. No JVD. Lungs are clear. Cardiac exam shows a regular rate and rhythm. Abdomen is soft. Extremities are  without edema. Gait and ROM are intact. No gross neurologic deficits noted.   LABORATORY DATA: Pending  EKG today shows atrial fib with increased VR. Rate is 112.   No results found for this basename: WBC, HGB, HCT, PLT, GLUCOSE, CHOL, TRIG, HDL, LDLDIRECT, LDLCALC, ALT, AST, NA, K, CL, CREATININE, BUN, CO2, TSH, PSA, INR, GLUF, HGBA1C, MICROALBUR    Echo Study Conclusions  - Left ventricle: The cavity size was normal. Wall thickness was increased in a pattern of moderate LVH. Systolic function was mildly to moderately reduced. The estimated ejection fraction was in the range of 40% to 45%. Wall motion was normal; there were no regional wall motion abnormalities. - Mitral valve: Mild regurgitation. - Left atrium: The atrium was moderately dilated. - Atrial septum: No defect or patent foramen ovale was identified. - Pulmonary arteries: PA peak pressure: 32mm Hg (S).  Assessment / Plan:  1.  Atrial fib - he has been on his anticoagulation for over 3 weeks. We will arrange for cardioversion next week. The procedure, risks, benefits have been reviewed and he is willing to proceed. Lopressor 25 mg BID was added today for rate control. Will need to watch for bradycardia. Will check labs today and send for CXR. Would consider repeat echo. Hopefully his EF will improve if we are able to restore sinus rhythm.   2. CAD - no chest pain reported.   3. HTN   Will proceed with cardioversion next week. Have follow up in about 3 weeks with Dr. Clifton James. Patient is agreeable to this plan and will call if any problems develop in the interim.

## 2012-03-21 NOTE — Addendum Note (Signed)
Addended by: Rosalio Macadamia on: 03/21/2012 10:43 AM   Modules accepted: Orders

## 2012-03-22 ENCOUNTER — Telehealth: Payer: Self-pay | Admitting: Nurse Practitioner

## 2012-03-22 NOTE — Telephone Encounter (Signed)
New Problem:    [name] called in returning a call about the patient's lab work.  Please call back.

## 2012-03-26 MED ORDER — DEXTROSE-NACL 5-0.45 % IV SOLN: INTRAVENOUS | Status: DC

## 2012-03-27 ENCOUNTER — Encounter (HOSPITAL_COMMUNITY): Payer: Self-pay | Admitting: *Deleted

## 2012-03-27 ENCOUNTER — Encounter (HOSPITAL_COMMUNITY): Payer: Self-pay | Admitting: Anesthesiology

## 2012-03-27 ENCOUNTER — Encounter (HOSPITAL_COMMUNITY)
Admission: RE | Disposition: A | Discharge status: Home / Self Care | Payer: Self-pay | Source: Ambulatory Visit | Attending: Cardiology

## 2012-03-27 ENCOUNTER — Ambulatory Visit (HOSPITAL_COMMUNITY): Payer: Medicare Other | Admitting: Anesthesiology

## 2012-03-27 ENCOUNTER — Ambulatory Visit (HOSPITAL_COMMUNITY)
Admission: RE | Admit: 2012-03-27 | Discharge: 2012-03-27 | Disposition: A | Discharge status: Home / Self Care | Payer: Medicare Other | Source: Ambulatory Visit | Attending: Cardiology | Admitting: Cardiology

## 2012-03-27 DIAGNOSIS — E669 Obesity, unspecified: Secondary | ICD-10-CM | POA: Insufficient documentation

## 2012-03-27 DIAGNOSIS — Z01810 Encounter for preprocedural cardiovascular examination: Secondary | ICD-10-CM

## 2012-03-27 DIAGNOSIS — I4891 Unspecified atrial fibrillation: Secondary | ICD-10-CM | POA: Insufficient documentation

## 2012-03-27 DIAGNOSIS — I2510 Atherosclerotic heart disease of native coronary artery without angina pectoris: Secondary | ICD-10-CM | POA: Insufficient documentation

## 2012-03-27 DIAGNOSIS — Z7901 Long term (current) use of anticoagulants: Secondary | ICD-10-CM | POA: Insufficient documentation

## 2012-03-27 DIAGNOSIS — I10 Essential (primary) hypertension: Secondary | ICD-10-CM | POA: Insufficient documentation

## 2012-03-27 DIAGNOSIS — I252 Old myocardial infarction: Secondary | ICD-10-CM | POA: Insufficient documentation

## 2012-03-27 DIAGNOSIS — E119 Type 2 diabetes mellitus without complications: Secondary | ICD-10-CM | POA: Insufficient documentation

## 2012-03-27 DIAGNOSIS — Z9861 Coronary angioplasty status: Secondary | ICD-10-CM | POA: Insufficient documentation

## 2012-03-27 HISTORY — PX: CARDIOVERSION: SHX1299

## 2012-03-27 LAB — GLUCOSE, CAPILLARY

## 2012-03-27 SURGERY — CARDIOVERSION
Anesthesia: Monitor Anesthesia Care

## 2012-03-27 MED ORDER — SODIUM CHLORIDE 0.9 % IV SOLN: INTRAVENOUS | Status: DC

## 2012-03-27 MED ORDER — SODIUM CHLORIDE 0.9 % IV SOLN
INTRAVENOUS | Status: DC | PRN
  Administered 2012-03-27: 14:00:00 via INTRAVENOUS

## 2012-03-27 MED ORDER — PROPOFOL 10 MG/ML IV BOLUS
INTRAVENOUS | Status: DC | PRN
  Administered 2012-03-27: 100 mg via INTRAVENOUS

## 2012-03-27 MED ORDER — LIDOCAINE HCL (CARDIAC) 20 MG/ML IV SOLN
INTRAVENOUS | Status: DC | PRN
  Administered 2012-03-27: 50 mg via INTRAVENOUS

## 2012-03-27 NOTE — Transfer of Care (Signed)
Immediate Anesthesia Transfer of Care Note  Patient: [name]  Procedure(s) Performed: Procedure(s) (LRB) with comments: CARDIOVERSION (N/A)  Patient Location: Endoscopy Unit  Anesthesia Type:General  Level of Consciousness: awake, alert  and oriented  Airway & Oxygen Therapy: Patient Spontanous Breathing and Patient connected to nasal cannula oxygen  Post-op Assessment: Report given to PACU RN and Post -op Vital signs reviewed and stable  Post vital signs: Reviewed and stable  Complications: No apparent anesthesia complications

## 2012-03-27 NOTE — H&P (Signed)
Cardioversion H&P 03/27/2012    Gwyndolyn Saxon Date of Birth: Oct 01, 1945 Medical Record #[id number]   History of Present Illness: Mr. Minks is seen back today for a 3 week check. He is seen for Dr. Clifton James. He has known CAD with remote MI, prior PCI with BMS to the LAD. He had a cath in 2006 and his stent was patent at that time. Did have an 80% narrowning in the diagonal with an EF of 50% then. His other issues include HTN, HLD and DM. He has a newly found AAA that measured 3.4 x 3.4 back in September. He reports a tendency towards bradycardia and has been on beta blockers in the remote past.    He comes in today. He is here with his wife. He is doing well. No awareness of his atrial fib. Not dizzy or lightheaded. No syncope. No chest pain. No shortness of breath. Tolerating his Xarelto. Has not missed any doses.     Current Outpatient Prescriptions on File Prior to Visit   Medication  Sig  Dispense  Refill   .  atorvastatin (LIPITOR) 10 MG tablet  Take 10 mg by mouth 2 (two) times daily.          Marland Kitchen  glipiZIDE-metformin (METAGLIP) 2.5-500 MG per tablet  Take 2 tablets by mouth 2 (two) times daily before a meal.         .  Insulin Detemir (LEVEMIR FLEXPEN [place])  Inject 50 Units into the skin. As directed         .  nitroGLYCERIN (NITROSTAT) 0.4 MG SL tablet  Place 0.4 mg under the tongue every 5 (five) minutes as needed.         Marland Kitchen  omeprazole (PRILOSEC) 20 MG capsule  Take 20 mg by mouth daily.         .  ramipril (ALTACE) 10 MG capsule  Take 10 mg by mouth daily.         .  Rivaroxaban (XARELTO) 20 MG TABS  Take 1 tablet (20 mg total) by mouth daily.   90 tablet   0      No Known Allergies    Past Medical History   Diagnosis  Date   .  Coronary artery disease         post bare-metal stent to LAD in 2001   .  Diabetes mellitus     .  Hypertension     .  Myocardial infarct  2001   .  Atrial fibrillation  October 2013       Xarelto started; duration unknown   .  Obesity           Past Surgical History   Procedure  Date   .  Coronary angioplasty with stent placement  2001       bare-metal stent to the LAD   .  Cardiac catheterization  2006       stable with primarily nonobstructive disease   .  Skin graft     .  Hernia repair         History   Smoking status   .  Former Smoker -- 0.5 packs/day for 40 years   .  Types:  Cigarettes, Pipe, Cigars   .  Quit date:  05/11/1999   Smokeless tobacco   .  Never Used       History   Alcohol Use  No       Family History   Problem  Relation  Age of Onset   .  Heart disease  Father     .  ALS  Mother     .  Diabetes  Sister     .  Heart failure  Brother        Review of Systems: The review of systems is per the HPI.  All other systems were reviewed and are negative.   Physical Exam: BP 130/70  Pulse 64  Ht 6\' 3"  (1.905 m)  Wt 264 lb 12.8 oz (120.112 kg)  BMI 33.10 kg/m2 Patient is very pleasant and in no acute distress. He is obese. Skin is warm and dry. Color is normal.  HEENT is unremarkable. Normocephalic/atraumatic. PERRL. Sclera are nonicteric. Neck is supple. No masses. No JVD. Lungs are clear. Cardiac exam shows a regular rate and rhythm. Abdomen is soft. Extremities are without edema. Gait and ROM are intact. No gross neurologic deficits noted.     LABORATORY DATA: Pending   EKG today shows atrial fib with increased VR. Rate is 112.      No results found for this basename: WBC, HGB, HCT, PLT, GLUCOSE, CHOL, TRIG, HDL, LDLDIRECT, LDLCALC, ALT, AST, NA, K, CL, CREATININE, BUN, CO2, TSH, PSA, INR, GLUF, HGBA1C, MICROALBUR      Echo Study Conclusions  - Left ventricle: The cavity size was normal. Wall thickness was increased in a pattern of moderate LVH. Systolic function was mildly to moderately reduced. The estimated ejection fraction was in the range of 40% to 45%. Wall motion was normal; there were no regional wall motion abnormalities. - Mitral valve: Mild regurgitation. - Left  atrium: The atrium was moderately dilated. - Atrial septum: No defect or patent foramen ovale was identified. - Pulmonary arteries: PA peak pressure: 32mm Hg (S).  Assessment / Plan:   1. Atrial fib - he has been on his anticoagulation for over 3 weeks. We will arrange for cardioversion next week. The procedure, risks, benefits have been reviewed and he is willing to proceed. Lopressor 25 mg BID was added today for rate control. Will need to watch for bradycardia. Will check labs today and send for CXR. Would consider repeat echo. Hopefully his EF will improve if we are able to restore sinus rhythm.    2. CAD - no chest pain reported.    3. HTN    Will proceed with cardioversion next week. Have follow up in about 3 weeks with Dr. Clifton James. Patient is agreeable to this plan and will call if any problems develop in the interim.   03/27/2012  Patient has atrial fibrillation. Plans are made for cardioversion. The patient has been carefully anticoagulated with Xarelto For well over 3 weeks. Chemistry and hematology were checked recently. They are normal. We are ready to proceed with cardioversion today.  Jerral Bonito, MD

## 2012-03-27 NOTE — Preoperative (Signed)
Beta Blockers   Reason not to administer Beta Blockers:Not Applicable 

## 2012-03-27 NOTE — Anesthesia Procedure Notes (Signed)
Procedure Name: MAC Date/Time: 03/27/2012 2:27 PM Performed by: Marena Chancy Pre-anesthesia Checklist: Patient identified, Emergency Drugs available, Suction available, Timeout performed and Patient being monitored Patient Re-evaluated:Patient Re-evaluated prior to inductionOxygen Delivery Method: Ambu bag Preoxygenation: Pre-oxygenation with 100% oxygen Intubation Type: IV induction Dental Injury: Teeth and Oropharynx as per pre-operative assessment

## 2012-03-27 NOTE — Anesthesia Preprocedure Evaluation (Addendum)
Anesthesia Evaluation  Patient identified by MRN, date of birth, ID band Patient awake    Reviewed: Allergy & Precautions, H&P , NPO status , Patient's Chart, lab work & pertinent test results, reviewed documented beta blocker date and time   Airway Mallampati: III TM Distance: >3 FB Neck ROM: Full    Dental  (+) Teeth Intact   Pulmonary          Cardiovascular hypertension, Pt. on home beta blockers + CAD and + Past MI + dysrhythmias Atrial Fibrillation Rhythm:Irregular     Neuro/Psych    GI/Hepatic   Endo/Other  diabetes, Type 2, Oral Hypoglycemic Agents  Renal/GU      Musculoskeletal   Abdominal   Peds  Hematology   Anesthesia Other Findings   Reproductive/Obstetrics                          Anesthesia Physical Anesthesia Plan  ASA: III  Anesthesia Plan: General   Post-op Pain Management:    Induction: Intravenous  Airway Management Planned: Simple Face Mask  Additional Equipment:   Intra-op Plan:   Post-operative Plan:   Informed Consent: I have reviewed the patients History and Physical, chart, labs and discussed the procedure including the risks, benefits and alternatives for the proposed anesthesia with the patient or authorized representative who has indicated his/her understanding and acceptance.   Dental advisory given  Plan Discussed with: CRNA and Surgeon  Anesthesia Plan Comments:        Anesthesia Quick Evaluation

## 2012-03-27 NOTE — CV Procedure (Signed)
   Careful arrangements were made over the several weeks for the patient to be set up for cardioversion. He has been on Xarelto daily for greater than 3 weeks. He's been stable. His labs were checked in the renal function and CBC were normal. He was brought into the endoscopy lab.  Consent was obtained appropriately.  Time out was done. The patient was identified and the purpose the procedure was identified appropriately.  Anesthesia was present. The patient received 100 mg of IV Toprol. The biphasic defibrillator was used. He was given one shock with 120 J of energy. He converted immediately to sinus rhythm.    Patient tolerated the procedure well. There were no complications. He received one shock. He is maintaining normal sinus rhythm.  The patient will be discharged home in one hour after he is completely awake and alert. He will receive information from the office about his followup visit. He is to remain on Xarelto daily.  Jerral Bonito, MD

## 2012-03-27 NOTE — Anesthesia Postprocedure Evaluation (Signed)
  Anesthesia Post-op Note  Patient: [name]  Procedure(s) Performed: Procedure(s) (LRB) with comments: CARDIOVERSION (N/A)  Patient Location: Endoscopy Unit  Anesthesia Type:General  Level of Consciousness: awake, alert  and oriented  Airway and Oxygen Therapy: Patient Spontanous Breathing and Patient connected to nasal cannula oxygen  Post-op Pain: none  Post-op Assessment: Post-op Vital signs reviewed, Patient's Cardiovascular Status Stable, Respiratory Function Stable and Patent Airway  Post-op Vital Signs: Reviewed and stable  Complications: No apparent anesthesia complications

## 2012-03-28 ENCOUNTER — Encounter (HOSPITAL_COMMUNITY): Payer: Self-pay | Admitting: Cardiology

## 2012-03-30 ENCOUNTER — Other Ambulatory Visit: Payer: Self-pay | Admitting: *Deleted

## 2012-03-30 DIAGNOSIS — I714 Abdominal aortic aneurysm, without rupture: Secondary | ICD-10-CM

## 2012-04-20 ENCOUNTER — Telehealth: Payer: Self-pay | Admitting: Nurse Practitioner

## 2012-04-20 NOTE — Telephone Encounter (Signed)
New Problem:    Called in wanting to change a patients metoprolol tartrate (LOPRESSOR) 25 MG tablet to 90 day refills.  Please call back.

## 2012-04-21 MED ORDER — METOPROLOL TARTRATE 25 MG PO TABS: 25.0000 mg | ORAL_TABLET | Freq: Two times a day (BID) | ORAL | Status: DC

## 2012-04-21 NOTE — Telephone Encounter (Signed)
Called pt, informing him his 90 day supply of metoprolol tartrate (LOPRESSOR) 25 MG / 1 tablet  bid QTY 180 with 1 refill to [facility].  Caralee Ates, CMA

## 2012-04-26 ENCOUNTER — Ambulatory Visit (INDEPENDENT_AMBULATORY_CARE_PROVIDER_SITE_OTHER): Payer: Medicare Other | Admitting: Cardiovascular Disease

## 2012-04-26 ENCOUNTER — Encounter: Payer: Self-pay | Admitting: Cardiovascular Disease

## 2012-04-26 VITALS — BP 130/70 | HR 94 | Resp 18 | Ht 75.0 in | Wt 257.0 lb

## 2012-04-26 DIAGNOSIS — I2510 Atherosclerotic heart disease of native coronary artery without angina pectoris: Secondary | ICD-10-CM

## 2012-04-26 DIAGNOSIS — I4891 Unspecified atrial fibrillation: Secondary | ICD-10-CM

## 2012-04-26 NOTE — Patient Instructions (Addendum)
Your physician recommends that you schedule a follow-up appointment in:  3 months. --March 19,2014 at 10:15

## 2012-04-26 NOTE — Progress Notes (Signed)
History of Present Illness: 66 yo WM with history of CAD, HTN, hyperlipidemia, atrial fibrillation and DM who is here today for routine cardiac follow up. He has been followed in the past by Dr. Juanda Chance. In 2001 he had an anterior wall MI treated with a bare-metal stent to the LAD. He underwent catheterization in 2006 and the stent was patent and there was an 80% narrowing in the diagonal branch. His ejection fraction was 50%. He was noted to be in atrial fibrillation in October 2013 and was seen by Norma Fredrickson, NP. He was started on Xarelto and DCCV was arranged on 03/27/12 with return to NSR. Echo 03/02/12 with moderate LVH, LVEF 40-45%, mild MR.   He is here today for follow up. He is doing well. He has had no chest pain or SOB. No palpitations, near syncope or syncope.   Primary Care Physician: Burnell Blanks   Last Lipid Profile: Followed in primary care.   Past Medical History  Diagnosis Date  . Coronary artery disease     post bare-metal stent to LAD in 2001  . Diabetes mellitus   . Hypertension   . Myocardial infarct 2001  . Atrial fibrillation October 2013    Xarelto started; duration unknown  . Obesity     Past Surgical History  Procedure Date  . Coronary angioplasty with stent placement 2001    bare-metal stent to the LAD  . Cardiac catheterization 2006    stable with primarily nonobstructive disease  . Skin graft   . Hernia repair   . Cardioversion 03/27/2012    Procedure: CARDIOVERSION;  Surgeon: Luis Abed, MD;  Location: [facility] ENDOSCOPY;  Service: Cardiovascular;  Laterality: N/A;    Current Outpatient Prescriptions  Medication Sig Dispense Refill  . atorvastatin (LIPITOR) 10 MG tablet Take 10 mg by mouth 2 (two) times daily.       Marland Kitchen glipiZIDE-metformin (METAGLIP) 2.5-500 MG per tablet Take 2 tablets by mouth 2 (two) times daily before a meal.      . Insulin Detemir (LEVEMIR FLEXPEN [place]) Inject 50 Units into the skin. As directed      . metoprolol tartrate  (LOPRESSOR) 25 MG tablet Take 1 tablet (25 mg total) by mouth 2 (two) times daily.  180 tablet  1  . nitroGLYCERIN (NITROSTAT) 0.4 MG SL tablet Place 0.4 mg under the tongue every 5 (five) minutes as needed.      Marland Kitchen omeprazole (PRILOSEC) 20 MG capsule Take 20 mg by mouth daily.      . ramipril (ALTACE) 10 MG capsule Take 10 mg by mouth daily.      . Rivaroxaban (XARELTO) 20 MG TABS Take 1 tablet (20 mg total) by mouth daily.  90 tablet  0    No Known Allergies  History   Social History  . Marital Status: Married    Spouse Name: N/A    Number of Children: N/A  . Years of Education: N/A   Occupational History  . Not on file.   Social History Main Topics  . Smoking status: Former Smoker -- 0.5 packs/day for 40 years    Types: Cigarettes, Pipe, Cigars    Quit date: 05/11/1999  . Smokeless tobacco: Never Used  . Alcohol Use: No  . Drug Use: No  . Sexually Active: Yes   Other Topics Concern  . Not on file   Social History Narrative  . No narrative on file    Family History  Problem Relation Age of Onset  .  Heart disease Father   . ALS Mother   . Diabetes Sister   . Heart failure Brother     Review of Systems:  As stated in the HPI and otherwise negative.   BP 130/70  Pulse 94  Resp 18  Ht 6\' 3"  (1.905 m)  Wt 257 lb (116.574 kg)  BMI 32.12 kg/m2  Physical Examination: General: Well developed, well nourished, NAD HEENT: OP clear, mucus membranes moist SKIN: warm, dry. No rashes. Neuro: No focal deficits Musculoskeletal: Muscle strength 5/5 all ext Psychiatric: Mood and affect normal Neck: No JVD, no carotid bruits, no thyromegaly, no lymphadenopathy. Lungs:Clear bilaterally, no wheezes, rhonci, crackles Cardiovascular: Regular rate and rhythm. No murmurs, gallops or rubs. Abdomen:Soft. Bowel sounds present. Non-tender.  Extremities: No lower extremity edema. Pulses are 2 + in the bilateral DP/PT.  EKG: Atrial fibrillation, rate 94 bpm. Incomplete RBBB.    Echo 03/02/12: Left ventricle: The cavity size was normal. Wall thickness was increased in a pattern of moderate LVH. Systolic function was mildly to moderately reduced. The estimated ejection fraction was in the range of 40% to 45%. Wall motion was normal; there were no regional wall motion abnormalities. - Mitral valve: Mild regurgitation. - Left atrium: The atrium was moderately dilated. - Atrial septum: No defect or patent foramen ovale was identified. - Pulmonary arteries: PA peak pressure: 32mm Hg (S).   Assessment and Plan:   1. CAD: Stable. He had a stent placed in 2001. He has had no angina. Continue ASA, Ace-inhibitor and statin. Lipids are followed in primary care and controlled per pt. BP is at goal. No changes today.   2. Atrial fibrillation: He is back in atrial fibrillation today. Rate is controlled on Lopressor. Will continue Xarelto for anticoagulation. He does not wish to seek EP referral for ablation or other therapies.   3. HYPERTENSION: BP well controlled.

## 2012-05-19 ENCOUNTER — Telehealth: Payer: Self-pay | Admitting: Cardiovascular Disease

## 2012-05-19 MED ORDER — METOPROLOL TARTRATE 25 MG PO TABS: 25.0000 mg | ORAL_TABLET | Freq: Two times a day (BID) | ORAL | Status: DC

## 2012-05-19 NOTE — Telephone Encounter (Signed)
New problem:   metorpolol  25 mg   [facility] in [place]  [phone] . Insurance stating the cost would be better for a 90 days supply  3 refill.

## 2012-06-07 ENCOUNTER — Other Ambulatory Visit: Payer: Self-pay | Admitting: Nurse Practitioner

## 2012-07-26 ENCOUNTER — Ambulatory Visit (INDEPENDENT_AMBULATORY_CARE_PROVIDER_SITE_OTHER): Payer: Medicare Other | Admitting: Cardiovascular Disease

## 2012-07-26 ENCOUNTER — Encounter: Payer: Self-pay | Admitting: Cardiovascular Disease

## 2012-07-26 VITALS — BP 105/70 | HR 100 | Ht 75.0 in | Wt 251.0 lb

## 2012-07-26 DIAGNOSIS — I2510 Atherosclerotic heart disease of native coronary artery without angina pectoris: Secondary | ICD-10-CM

## 2012-07-26 NOTE — Patient Instructions (Addendum)
Your physician wants you to follow-up in:  6 months. You will receive a reminder letter in the mail two months in advance. If you don't receive a letter, please call our office to schedule the follow-up appointment.   

## 2012-07-26 NOTE — Progress Notes (Signed)
History of Present Illness: 67 yo WM with history of CAD, HTN, hyperlipidemia, atrial fibrillation and DM who is here today for routine cardiac follow up. He has been followed in the past by Dr. Juanda Chance. In 2001 he had an anterior wall MI treated with a bare-metal stent to the LAD. He underwent catheterization in 2006 and the stent was patent and there was an 80% narrowing in the diagonal branch. His ejection fraction was 50%. He was noted to be in atrial fibrillation in October 2013 and was seen by Norma Fredrickson, NP. He was started on Xarelto and DCCV was arranged on 03/27/12 with return to NSR. Echo 03/02/12 with moderate LVH, LVEF 40-45%, mild MR.   He is here today for follow up. He is doing well. He has had no chest pain or SOB. No palpitations, near syncope or syncope. He is exercising at the gym several days per week. He is taking his Xarelto and having no bleeding issues.   Primary Care Physician: Burnell Blanks   Last Lipid Profile: Followed in primary care.   Past Medical History  Diagnosis Date  . Coronary artery disease     post bare-metal stent to LAD in 2001  . Diabetes mellitus   . Hypertension   . Myocardial infarct 2001  . Atrial fibrillation October 2013    Xarelto started; duration unknown  . Obesity     Past Surgical History  Procedure Laterality Date  . Coronary angioplasty with stent placement  2001    bare-metal stent to the LAD  . Cardiac catheterization  2006    stable with primarily nonobstructive disease  . Skin graft    . Hernia repair    . Cardioversion  03/27/2012    Procedure: CARDIOVERSION;  Surgeon: Luis Abed, MD;  Location: [facility] ENDOSCOPY;  Service: Cardiovascular;  Laterality: N/A;    Current Outpatient Prescriptions  Medication Sig Dispense Refill  . atorvastatin (LIPITOR) 10 MG tablet Take 10 mg by mouth 2 (two) times daily.       Marland Kitchen glipiZIDE-metformin (METAGLIP) 2.5-500 MG per tablet Take 2 tablets by mouth 2 (two) times daily before a  meal.      . Insulin Detemir (LEVEMIR FLEXPEN [place]) Inject 50 Units into the skin. As directed      . metoprolol tartrate (LOPRESSOR) 25 MG tablet Take 1 tablet (25 mg total) by mouth 2 (two) times daily.  180 tablet  3  . nitroGLYCERIN (NITROSTAT) 0.4 MG SL tablet Place 0.4 mg under the tongue every 5 (five) minutes as needed.      Marland Kitchen omeprazole (PRILOSEC) 20 MG capsule Take 20 mg by mouth daily.      . ramipril (ALTACE) 10 MG capsule Take 10 mg by mouth daily.      Carlena Hurl 20 MG TABS TAKE 1 TABLET EVERY DAY  90 tablet  3   No current facility-administered medications for this visit.    No Known Allergies  History   Social History  . Marital Status: Married    Spouse Name: N/A    Number of Children: N/A  . Years of Education: N/A   Occupational History  . Not on file.   Social History Main Topics  . Smoking status: Former Smoker -- 0.50 packs/day for 40 years    Types: Cigarettes, Pipe, Cigars    Quit date: 05/11/1999  . Smokeless tobacco: Never Used  . Alcohol Use: No  . Drug Use: No  . Sexually Active: Yes  Other Topics Concern  . Not on file   Social History Narrative  . No narrative on file    Family History  Problem Relation Age of Onset  . Heart disease Father   . ALS Mother   . Diabetes Sister   . Heart failure Brother     Review of Systems:  As stated in the HPI and otherwise negative.   BP 105/70  Pulse 100  Ht 6\' 3"  (1.905 m)  Wt 251 lb (113.853 kg)  BMI 31.37 kg/m2  Physical Examination: General: Well developed, well nourished, NAD HEENT: OP clear, mucus membranes moist SKIN: warm, dry. No rashes. Neuro: No focal deficits Musculoskeletal: Muscle strength 5/5 all ext Psychiatric: Mood and affect normal Neck: No JVD, no carotid bruits, no thyromegaly, no lymphadenopathy. Lungs:Clear bilaterally, no wheezes, rhonci, crackles Cardiovascular: Irregular irregular. No murmurs, gallops or rubs. Abdomen:Soft. Bowel sounds present. Non-tender.    Extremities: No lower extremity edema. Pulses are 2 + in the bilateral DP/PT.  EKG: Atrial fibrillation, rate 100 bpm.   Echo 03/02/12:  Left ventricle: The cavity size was normal. Wall thickness was increased in a pattern of moderate LVH. Systolic function was mildly to moderately reduced. The estimated ejection fraction was in the range of 40% to 45%. Wall motion was normal; there were no regional wall motion abnormalities. - Mitral valve: Mild regurgitation. - Left atrium: The atrium was moderately dilated. - Atrial septum: No defect or patent foramen ovale was identified. - Pulmonary arteries: PA peak pressure: 32mm Hg (S).  Assessment and Plan:   1. CAD: Stable. He had a stent placed in 2001. He has had no angina. Continue ASA, Ace-inhibitor and statin. Lipids are followed in primary care and controlled per pt. BP is at goal. No changes today.   2. Atrial fibrillation: Asymptomatic. Rate controlled on Lopressor. Will continue Xarelto for anticoagulation. He does not wish to seek EP referral for ablation or other therapies.   3. HYPERTENSION: BP well controlled.

## 2012-10-03 ENCOUNTER — Telehealth: Payer: Self-pay | Admitting: Cardiovascular Disease

## 2012-10-03 NOTE — Telephone Encounter (Signed)
New problem    Need to stop xarelto 2 days prior to colonoscopy.

## 2012-10-03 NOTE — Telephone Encounter (Signed)
Dr. Matthias Hughs would like for patient to stop his Xarelto for two days prior to an upcoming colonoscopy. They would like a faxed statement regarding if this is approved by Dr. Clifton James. Attention: [facility] @ Dr. Matthias Hughs fax number: [phone]/office number: [phone]. Routed to Dr. Clifton James and Charlotte Crumb, RN.

## 2012-10-04 ENCOUNTER — Encounter: Payer: Self-pay | Admitting: Cardiovascular Disease

## 2012-10-04 NOTE — Telephone Encounter (Signed)
Dennie Bible, I have written a letter. Can you print it and have it faxed? Thanks, chris

## 2012-10-04 NOTE — Telephone Encounter (Signed)
Letter faxed to Velna Hatchet at Dr. Donavan Burnet office

## 2012-10-10 ENCOUNTER — Other Ambulatory Visit: Payer: Self-pay | Admitting: Gastroenterology

## 2012-10-10 DIAGNOSIS — Z86010 Personal history of colonic polyps: Secondary | ICD-10-CM

## 2012-10-24 ENCOUNTER — Ambulatory Visit
Admission: RE | Admit: 2012-10-24 | Discharge: 2012-10-24 | Disposition: A | Discharge status: Home / Self Care | Payer: Medicare Other | Source: Ambulatory Visit | Attending: Gastroenterology | Admitting: Gastroenterology

## 2012-10-24 DIAGNOSIS — Z86010 Personal history of colonic polyps: Secondary | ICD-10-CM

## 2012-12-13 ENCOUNTER — Other Ambulatory Visit: Payer: Self-pay

## 2013-01-29 ENCOUNTER — Encounter: Payer: Self-pay | Admitting: Family

## 2013-01-30 ENCOUNTER — Ambulatory Visit (INDEPENDENT_AMBULATORY_CARE_PROVIDER_SITE_OTHER): Payer: Medicare Other | Admitting: Family

## 2013-01-30 ENCOUNTER — Encounter: Payer: Self-pay | Admitting: Family

## 2013-01-30 ENCOUNTER — Encounter (INDEPENDENT_AMBULATORY_CARE_PROVIDER_SITE_OTHER): Payer: Medicare Other | Admitting: *Deleted

## 2013-01-30 VITALS — BP 120/80 | HR 80 | Resp 16 | Ht 75.0 in | Wt 261.0 lb

## 2013-01-30 DIAGNOSIS — I714 Abdominal aortic aneurysm, without rupture, unspecified: Secondary | ICD-10-CM

## 2013-01-30 NOTE — Progress Notes (Signed)
[facility]   Established Abdominal Aortic Aneurysm  History of Present Illness  [name] is a 67 y.o. ([birth year]) male patient of Dr. Arbie Cookey who underwent a CT scan for workup of hematuria. He had an incidental finding of a 3.6 cm infrarenal abdominal aortic aneurysm. 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 He had no known knowledge of this aneurysm. He has no symptoms of the aneurysm. He does have a history of prior myocardial infarction 2001 and had a bare-metal stent placed that time. 1 year ago Duplex demonstrates an AAA, measuring 3.4 cm.  The patient does not have acute back or abdominal pain.   He has degenerative disc disease in his back, but this has improved with physical therapy and exercise. He takes Xarelto for atrial fib., Dr. Camillo Flaming is his cardiologist.  Pt Diabetic: Yes, 9.1 Pt smoker: former smoker, quit 2001  Past Medical History  Diagnosis Date  . Coronary artery disease     post bare-metal stent to LAD in 2001  . Diabetes mellitus   . Hypertension   . Myocardial infarct 2001  . Atrial fibrillation October 2013    Xarelto started; duration unknown  . Obesity    Past Surgical History  Procedure Laterality Date  . Coronary angioplasty with stent placement  2001    bare-metal stent to the LAD  . Cardiac catheterization  2006    stable with primarily nonobstructive disease  . Skin graft    . Hernia repair    . Cardioversion  03/27/2012    Procedure: CARDIOVERSION;  Surgeon: Luis Abed, MD;  Location: [facility] ENDOSCOPY;  Service: Cardiovascular;  Laterality: N/A;   Social History History   Social History  . Marital Status: Married    Spouse Name: N/A    Number of Children: N/A  . Years of Education: N/A   Occupational History  . Not on file.   Social History Main Topics  . Smoking status: Former Smoker -- 0.50 packs/day for 40 years    Types: Cigarettes, Pipe, Cigars    Quit date:  05/11/1999  . Smokeless tobacco: Never Used  . Alcohol Use: No  . Drug Use: No  . Sexual Activity: Yes   Other Topics Concern  . Not on file   Social History Narrative  . No narrative on file   Family History Family History  Problem Relation Age of Onset  . Heart disease Father   . ALS Mother   . Diabetes Sister   . Heart failure Brother     Current Outpatient Prescriptions on File Prior to Visit  Medication Sig Dispense Refill  . atorvastatin (LIPITOR) 10 MG tablet Take 10 mg by mouth 2 (two) times daily.       Marland Kitchen glipiZIDE-metformin (METAGLIP) 2.5-500 MG per tablet Take 2 tablets by mouth 2 (two) times daily before a meal.      . Insulin Detemir (LEVEMIR FLEXPEN [place]) Inject 50 Units into the skin. As directed      . metoprolol tartrate (LOPRESSOR) 25 MG tablet Take 1 tablet (25 mg total) by mouth 2 (two) times daily.  180 tablet  3  . nitroGLYCERIN (NITROSTAT) 0.4 MG SL tablet Place 0.4 mg under the tongue every 5 (five) minutes as needed.      Marland Kitchen omeprazole (PRILOSEC) 20 MG capsule Take 20 mg by mouth daily.      . ramipril (ALTACE) 10 MG capsule Take 10 mg by  mouth daily.      Carlena Hurl 20 MG TABS TAKE 1 TABLET EVERY DAY  90 tablet  3   No current facility-administered medications on file prior to visit.   No Known Allergies  ROS: [x]  Positive   [ ]  Negative   [ ]  All sytems reviewed and are negative  General: [ ]  Weight loss, [ ]  Fever, [ ]  chills Neurologic: [ ]  Dizziness, [ ]  Blackouts, [ ]  Seizure [ ]  Stroke, [ ]  "Mini stroke", [ ]  Slurred speech, [ ]  Temporary blindness; [ ]  weakness in arms or legs, [ ]  Hoarseness Cardiac: [ ]  Chest pain/pressure, [ ]  Shortness of breath at rest [ ]  Shortness of breath with exertion, [ ]  Atrial fibrillation or irregular heartbeat Vascular: [ ]  Pain in legs with walking, [ ]  Pain in legs at rest, [ ]  Pain in legs at night,  [ ]  Non-healing ulcer, [ ]  Blood clot in vein/DVT,   Pulmonary: [ ]  Home oxygen, [ ]  Productive cough, [ ]   Coughing up blood, [ ]  Asthma,  [ ]  Wheezing Musculoskeletal:  Arly.Keller ] Arthritis, Arly.Keller ] Low back pain, [ ]  Joint pain Hematologic: Arly.Keller ] Easy Bruising, [ ]  Anemia; [ ]  Hepatitis Gastrointestinal: [ ]  Blood in stool, [ ]  Gastroesophageal Reflux/heartburn, [ ]  Trouble swallowing Urinary: [ ]  chronic Kidney disease, [ ]  on HD - [ ]  MWF or [ ]  TTHS, [ ]  Burning with urination, [ ]  Difficulty urinating Skin: [ ]  Rashes, [ ]  Wounds Psychological: [ ]  Anxiety, [ ]  Depression  Physical Examination  Filed Vitals:   01/30/13 0915  BP: 120/80  Pulse: 80  Resp: 16   Filed Weights   01/30/13 0915  Weight: 261 lb (118.389 kg)   Body mass index is 32.62 kg/(m^2).  General: A&O x 3, WD, Obese,.  Pulmonary: Sym exp, good air movt, CTAB, no rales, rhonchi, & wheezing.   Cardiac: Irregular rate, no Murmurs, rubs or gallops.  Carotid Bruits Left Right   Negative Negative      Radial pulses are palpable bilaterally. Aorta is not palpable. Popliteal pulses are not palpable.                      Gastrointestinal: soft, NTND, -G/R, - HSM, - masses, - CVAT B.  Musculoskeletal: M/S 5/5 throughout, Extremities without ischemic changes.  Neurologic: CN 2-12 intact except he is moderately hard of hearing, Pain and light touch intact in extremities, Motor exam as listed above  Non-Invasive Vascular Imaging  AAA Duplex (01/30/2013)  Previous size: 3.4 cm (Date: 01/25/1012)  Current size:  3.9 cm (Date: 01/30/2013)  Medical Decision Making  The patient is a 67 y.o. male who presents with: asymptomatic AAA with slight increase in size.   Based on this patient's exam and diagnostic studies, and after discussing with Dr. Arbie Cookey, the patient will follow up in 1 year for aorto-iliac Duplex  The threshold for repair is AAA size > 5.5 cm, growth > 1 cm/yr, and symptomatic status.  I emphasized the importance of maximal medical management including strict control of blood pressure, blood glucose, and  lipid levels, antiplatelet agents, obtaining regular exercise, and continued cessation of smoking.   The patient was given information about AAA including signs, symptoms, treatment, and how to minimize the risk of enlargement and rupture of aneurysms.  He was advised to call 911 should he experience sudden onset abdominal or back pain.    Thank you for allowing  [place] to participate in this patient's care.  Charisse March, RN, MSN, FNP-C [facility] [place] Office: [phone]  Clinic Physician: Early  01/30/2013, 8:49 AM

## 2013-01-30 NOTE — Patient Instructions (Signed)
Abdominal Aortic Aneurysm  An aneurysm is the enlargement (dilatation), bulging, or ballooning out of part of the wall of a vein or artery. An aortic aneurysm is a bulging in the largest artery of the body. This artery supplies blood from the heart to the rest of the body.  The first part of the aorta is called the thoracic aorta. It leaves the heart, rises (ascends), arches, and goes down (descends) through the chest until it reaches the diaphragm. The diaphragm is the muscular part between the chest and abdomen.  The second part of the aorta is called the abdominal aorta after it has passed the diaphragm and continues down through the abdomen. The abdominal aorta ends where it splits to form the two iliac arteries that go to the legs. Aortic aneurysms can develop anywhere along the length of the aorta. The majority are located along the abdominal aorta. The major concern with an aortic aneurysm is that it can enlarge and rupture. This can cause death unless diagnosed and treated promptly. Aneurysms can also develop blood clots or infections. CAUSES  Many aortic aneurysms are caused by arteriosclerosis. Arteriosclerosis can weaken the aortic wall. The pressure of the blood being pumped through the aorta causes it to balloon out at the site of weakness. Therefore, high blood pressure (hypertension) is associated with aneurysm. Other risk factors include:  Age over 60.  Tobacco use.  Being male.  White race.  Family history of aneurysm.  Less frequent causes of abdominal aortic aneurysms include:  Connective tissue diseases.  Abdominal trauma.  Inflammation of blood vessles (arteritis).  Inherited (congenital) malformations.  Infection. SYMPTOMS  The signs and symptoms of an unruptured aneurysm will partly depend on its size and rate of growth.   Abdominal aortic aneurysms may cause pain. The pain typically has a deep quality as if it is piercing into the person. It is felt most  often in the lower back area. The pain is usually steady but may be relieved by changing your body position.  The person may also become aware of an abnormally prominent pulse in the belly (abdominal pulsation). DIAGNOSIS  An aortic aneurysm may be discovered by chance on physical exam, or on X-ray studies done for other reasons. It may be suspected because of other problems such as back or abdominal pain. The following tests may help identify the problem.  X-rays of the abdomen can show calcium deposits in the aneurysm wall.  CT scanning of the abdomen, particularly with contrast medium, is accurate at showing the exact size and shape of the aneurysm.  Ultrasounds give a clear picture of the size of an aneurysm (about 98% accuracy).  MRI scanning is accurate, but often unnecessary.  An abdominal angiogram shows the source of the major blood vessels arising from the aorta. It reveals the size and extent of any aneurysm. It can also show a clot clinging to the wall of the aneurysm (mural thrombus). TREATMENT  Treating an abdominal aortic aneurysm depends on the size. A rupture of an aneurysm is uncommon when they are less than 5 cm wide (2 inches). Rupture is far more common in aneurysms that are over 6 cm wide (2.4 inches).  Surgical repair is usually recommended for all aneurysms over 6 cm wide (2.4 inches). This depends on the health, age, and other circumstances of the individual. This type of surgery consists of opening the abdomen, removing the aneurysm, and sewing a synthetic graft (similar to a cloth tube) in its place. A   less invasive form of this surgery, using stent grafts, is sometimes recommended.  For most patients, elective repair is recommended for aneurysms between 4 and 6 cm (1.6 and 2.4 inches). Elective means the surgery can be done at your convenience. This should not be put off too long if surgery is recommended.  If you smoke, stop immediately. Smoking is a major risk  factor for enlargement and rupture.  Medications may be used to help decrease complications  these include medicine to lower blood pressure and control cholesterol. HOME CARE INSTRUCTIONS   If you smoke, stop. Do not start smoking.  Take all medications as prescribed.  Your caregiver will tell you when to have your aneurysm rechecked, either by ultrasound or CT scan.  If your caregiver has given you a follow-up appointment, it is very important to keep that appointment. Not keeping the appointment could result in a chronic or permanent injury, pain, or disability. If there is any problem keeping the appointment, you must call back to this facility for assistance. SEEK MEDICAL CARE IF:   You develop mild abdominal pain or pressure.  You are able to feel or perceive your aneurysm, and you sense any change. SEEK IMMEDIATE MEDICAL CARE IF:   You develop severe abdominal pain, or severe pain moving (radiating) to your back.  You suddenly develop cold or blue toes or feet.  You suddenly develop lightheadedness or fainting spells. MAKE SURE YOU:   Understand these instructions.  Will watch your condition.  Will get help right away if you are not doing well or get worse. Document Released: 02/03/2005 Document Revised: 07/19/2011 Document Reviewed: 11/28/2007 Exit[employer] Patient Information 2014 [employer].  

## 2013-02-09 ENCOUNTER — Other Ambulatory Visit: Payer: Self-pay | Admitting: *Deleted

## 2013-02-09 DIAGNOSIS — I714 Abdominal aortic aneurysm, without rupture: Secondary | ICD-10-CM

## 2013-03-15 ENCOUNTER — Other Ambulatory Visit: Payer: Self-pay

## 2013-05-22 ENCOUNTER — Telehealth: Payer: Self-pay | Admitting: Cardiovascular Disease

## 2013-05-22 NOTE — Telephone Encounter (Signed)
OK to hold Xarelto if necessary. cdm

## 2013-05-22 NOTE — Telephone Encounter (Signed)
New message     Going to dentist tomorrow to check bad tooth----will he have to stop his xeralto in order to have tooth pulled?

## 2013-05-22 NOTE — Telephone Encounter (Signed)
Reviewed with Elberta Leatherwood, PharmD and if it is a simple tooth removal pt does not need to stop Xarelto. If complicated and cutting involved should hold Xarelto 24 hours prior to tooth removal. I spoke with pt and gave him this information.

## 2013-06-03 ENCOUNTER — Other Ambulatory Visit: Payer: Self-pay | Admitting: Nurse Practitioner

## 2013-06-05 ENCOUNTER — Other Ambulatory Visit: Payer: Self-pay | Admitting: Cardiovascular Disease

## 2013-08-10 ENCOUNTER — Encounter: Payer: Self-pay | Admitting: Cardiovascular Disease

## 2013-08-10 ENCOUNTER — Ambulatory Visit (INDEPENDENT_AMBULATORY_CARE_PROVIDER_SITE_OTHER): Payer: Medicare Other | Admitting: Cardiovascular Disease

## 2013-08-10 VITALS — BP 114/80 | HR 93 | Ht 74.0 in | Wt 239.8 lb

## 2013-08-10 DIAGNOSIS — I2510 Atherosclerotic heart disease of native coronary artery without angina pectoris: Secondary | ICD-10-CM

## 2013-08-10 DIAGNOSIS — I4891 Unspecified atrial fibrillation: Secondary | ICD-10-CM

## 2013-08-10 DIAGNOSIS — I10 Essential (primary) hypertension: Secondary | ICD-10-CM

## 2013-08-10 NOTE — Patient Instructions (Signed)
Your physician wants you to follow-up in:  12 months.  You will receive a reminder letter in the mail two months in advance. If you don't receive a letter, please call our office to schedule the follow-up appointment.   

## 2013-08-10 NOTE — Progress Notes (Signed)
History of Present Illness: 68 yo WM with history of CAD, HTN, hyperlipidemia, atrial fibrillation and DM who is here today for routine cardiac follow up. He has been followed in the past by Dr. Olevia Perches. In 2001 he had an anterior wall MI treated with a bare-metal stent to the LAD. He underwent catheterization in 2006 and the stent was patent and there was an 80% narrowing in the diagonal branch. His ejection fraction was 50%. He was noted to be in atrial fibrillation in October 2013 and was seen by Truitt Merle, NP. He was started on Xarelto and DCCV was arranged on 03/27/12 with return to [facility]. Echo 03/02/12 with moderate LVH, LVEF 40-45%, mild MR.   He is here today for follow up. He is doing well. He has had no chest pain or SOB. No palpitations, near syncope or syncope. He is exercising at the gym several days per week but he hurt his back. He has an MRI next week.  He is taking his Xarelto and having no bleeding issues.   Primary Care Physician: Daiva Eves   Last Lipid Profile: Followed in primary care.   Past Medical History  Diagnosis Date  . Coronary artery disease     post bare-metal stent to LAD in 2001  . Diabetes mellitus   . Hypertension   . Myocardial infarct 2001  . Atrial fibrillation October 2013    Xarelto started; duration unknown  . Obesity     Past Surgical History  Procedure Laterality Date  . Coronary angioplasty with stent placement  2001    bare-metal stent to the LAD  . Cardiac catheterization  2006    stable with primarily nonobstructive disease  . Skin graft  1981  . Hernia repair  2006  . Cardioversion  03/27/2012    Procedure: CARDIOVERSION;  Surgeon: Carlena Bjornstad, MD;  Location: [facility] ENDOSCOPY;  Service: Cardiovascular;  Laterality: N/A;    Current Outpatient Prescriptions  Medication Sig Dispense Refill  . atorvastatin (LIPITOR) 10 MG tablet Take 10 mg by mouth 2 (two) times daily.       . Empagliflozin (JARDIANCE) 10 MG TABS Take 10 mg by  mouth daily.      Marland Kitchen glipiZIDE-metformin (METAGLIP) 2.5-500 MG per tablet Take 2 tablets by mouth 2 (two) times daily before a meal.      . metoprolol tartrate (LOPRESSOR) 25 MG tablet TAKE 1 TABLET BY MOUTH TWICE A DAY  180 tablet  0  . nitroGLYCERIN (NITROSTAT) 0.4 MG SL tablet Place 0.4 mg under the tongue every 5 (five) minutes as needed.      Marland Kitchen omeprazole (PRILOSEC) 20 MG capsule Take 20 mg by mouth daily.      . ramipril (ALTACE) 10 MG capsule Take 10 mg by mouth daily.      Alveda Reasons 20 MG TABS tablet TAKE 1 TABLET BY MOUTH EVERY DAY  90 tablet  0   No current facility-administered medications for this visit.    No Known Allergies  History   Social History  . Marital Status: Married    Spouse Name: N/A    Number of Children: N/A  . Years of Education: N/A   Occupational History  . Not on file.   Social History Main Topics  . Smoking status: Former Smoker -- 0.50 packs/day for 40 years    Types: Cigarettes, Pipe, Cigars    Quit date: 05/11/1999  . Smokeless tobacco: Never Used  . Alcohol Use: No  .  Drug Use: No  . Sexual Activity: Yes   Other Topics Concern  . Not on file   Social History Narrative  . No narrative on file    Family History  Problem Relation Age of Onset  . Heart disease Father   . ALS Mother   . Diabetes Sister   . Hypertension Sister   . Heart failure Brother     Review of Systems:  As stated in the HPI and otherwise negative.   BP 114/80  Pulse 93  Ht 6\' 2"  (1.88 m)  Wt 239 lb 12.8 oz (108.773 kg)  BMI 30.78 kg/m2  Physical Examination: General: Well developed, well nourished, NAD HEENT: OP clear, mucus membranes moist SKIN: warm, dry. No rashes. Neuro: No focal deficits Musculoskeletal: Muscle strength 5/5 all ext Psychiatric: Mood and affect normal Neck: No JVD, no carotid bruits, no thyromegaly, no lymphadenopathy. Lungs:Clear bilaterally, no wheezes, rhonci, crackles Cardiovascular: Irregular irregular. No murmurs, gallops  or rubs. Abdomen:Soft. Bowel sounds present. Non-tender.  Extremities: No lower extremity edema. Pulses are 2 + in the bilateral DP/PT.  EKG: Atrial fibrillation, rate 92 bpm. Incomplete RBBB.  Echo 03/02/12:  Left ventricle: The cavity size was normal. Wall thickness was increased in a pattern of moderate LVH. Systolic function was mildly to moderately reduced. The estimated ejection fraction was in the range of 40% to 45%. Wall motion was normal; there were no regional wall motion abnormalities. - Mitral valve: Mild regurgitation. - Left atrium: The atrium was moderately dilated. - Atrial septum: No defect or patent foramen ovale was identified. - Pulmonary arteries: PA peak pressure: 67mm Hg (S).  Assessment and Plan:   1. CAD: Stable. He had a stent placed in 2001. He has had no angina. Continue ASA, Ace-inhibitor and statin. Lipids are followed in primary care and controlled per pt. BP is at goal. No changes today.   2. Atrial fibrillation: Asymptomatic. Rate controlled on Lopressor. Will continue Xarelto for anticoagulation. He does not wish to seek EP referral for ablation or other therapies.   3. HYPERTENSION: BP well controlled.

## 2013-09-02 ENCOUNTER — Other Ambulatory Visit: Payer: Self-pay | Admitting: Cardiovascular Disease

## 2014-01-15 ENCOUNTER — Other Ambulatory Visit: Payer: Self-pay | Admitting: Cardiovascular Disease

## 2014-02-04 ENCOUNTER — Encounter: Payer: Self-pay | Admitting: Family

## 2014-02-05 ENCOUNTER — Encounter: Payer: Self-pay | Admitting: Family

## 2014-02-05 ENCOUNTER — Ambulatory Visit (HOSPITAL_COMMUNITY)
Admission: RE | Admit: 2014-02-05 | Discharge: 2014-02-05 | Disposition: A | Discharge status: Home / Self Care | Payer: Medicare Other | Source: Ambulatory Visit | Attending: Vascular Surgery | Admitting: Vascular Surgery

## 2014-02-05 ENCOUNTER — Ambulatory Visit (INDEPENDENT_AMBULATORY_CARE_PROVIDER_SITE_OTHER): Payer: Medicare Other | Admitting: Family

## 2014-02-05 VITALS — BP 104/68 | HR 90 | Resp 16 | Ht 75.0 in | Wt 241.0 lb

## 2014-02-05 DIAGNOSIS — I714 Abdominal aortic aneurysm, without rupture, unspecified: Secondary | ICD-10-CM

## 2014-02-05 DIAGNOSIS — Z48812 Encounter for surgical aftercare following surgery on the circulatory system: Secondary | ICD-10-CM

## 2014-02-05 NOTE — Progress Notes (Signed)
[facility] [place]  Established Abdominal Aortic Aneurysm  History of Present Illness  [name] is a 68 y.o. (June 09, 1945) male patient of Dr. Donnetta Hutching who underwent a CT scan for workup of hematuria. He had an incidental finding of a 3.6 cm infrarenal abdominal aortic aneurysm. 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 He had no known knowledge of this aneurysm. He has no symptoms of the aneurysm. He does have a history of prior myocardial infarction 2001 and had a bare-metal stent placed that time.  The patient does not have acute back or abdominal pain.   He has degenerative disc disease in his back, but this has improved with physical therapy and exercise.  He takes Xarelto for atrial fib., Dr. Serita Sheller is his cardiologist.   Pt Diabetic: Yes, patient states uncontrolled Pt smoker: former smoker, quit 2001  The patient denies claudication in legs with walking. The patient denies history of stroke or TIA symptoms.   Past Medical History  Diagnosis Date  . Coronary artery disease     post bare-metal stent to LAD in 2001  . Diabetes mellitus   . Hypertension   . Myocardial infarct 2001  . Atrial fibrillation October 2013    Xarelto started; duration unknown  . Obesity    Past Surgical History  Procedure Laterality Date  . Coronary angioplasty with stent placement  2001    bare-metal stent to the LAD  . Cardiac catheterization  2006    stable with primarily nonobstructive disease  . Skin graft  1981  . Hernia repair  2006  . Cardioversion  03/27/2012    Procedure: CARDIOVERSION;  Surgeon: Carlena Bjornstad, MD;  Location: [facility] ENDOSCOPY;  Service: Cardiovascular;  Laterality: N/A;   Social History History   Social History  . Marital Status: Married    Spouse Name: N/A    Number of Children: N/A  . Years of Education: N/A   Occupational History  . Not on file.   Social History Main Topics  . Smoking status: Former  Smoker -- 0.50 packs/day for 40 years    Types: Cigarettes, Pipe, Cigars    Quit date: 05/11/1999  . Smokeless tobacco: Never Used  . Alcohol Use: No  . Drug Use: No  . Sexual Activity: Yes   Other Topics Concern  . Not on file   Social History Narrative  . No narrative on file   Family History Family History  Problem Relation Age of Onset  . Heart disease Father   . ALS Mother   . Diabetes Sister   . Hypertension Sister   . Heart failure Brother     Current Outpatient Prescriptions on File Prior to Visit  Medication Sig Dispense Refill  . atorvastatin (LIPITOR) 10 MG tablet Take 10 mg by mouth 2 (two) times daily.       . Empagliflozin (JARDIANCE) 10 MG TABS Take 25 mg by mouth daily.       Marland Kitchen glipiZIDE-metformin (METAGLIP) 2.5-500 MG per tablet Take 2 tablets by mouth 2 (two) times daily before a meal.      . metoprolol tartrate (LOPRESSOR) 25 MG tablet TAKE 1 TABLET BY MOUTH TWICE A DAY  180 tablet  1  . nitroGLYCERIN (NITROSTAT) 0.4 MG SL tablet Place 0.4 mg under the tongue every 5 (five) minutes as needed.      . ramipril (ALTACE) 10 MG capsule Take 10 mg by mouth daily.      Marland Kitchen  XARELTO 20 MG TABS tablet TAKE 1 TABLET BY MOUTH EVERY DAY  90 tablet  1  . omeprazole (PRILOSEC) 20 MG capsule Take 20 mg by mouth daily.       No current facility-administered medications on file prior to visit.   No Known Allergies  ROS: See HPI for pertinent positives and negatives.  Physical Examination  Filed Vitals:   02/05/14 1035  BP: 104/68  Pulse: 90  Resp: 16  Height: 6\' 3"  (1.905 m)  Weight: 241 lb (109.317 kg)  SpO2: 96%   Body mass index is 30.12 kg/(m^2).  General: A&O x 3, WD, Obese male.  Pulmonary: Sym exp, good air movt, CTAB, no rales, rhonchi, & wheezing.  Cardiac: Irregular rate.   Carotid Bruits  Left  Right    Negative  Negative    Radial pulses are palpable bilaterally.  Aorta is not palpable, bilateral femoral pulses are not palpable.  Popliteal  pulses are 2+ palpable bilaterally. DP and PT pedal pulses are 2+ palpable bilaterally.    Gastrointestinal: soft, NTND, -G/R, - HSM, - masses, - CVAT B.  Musculoskeletal: M/S 5/5 throughout, Extremities without ischemic changes, no peripheral edema.  Neurologic: CN 2-12 intact except he is moderately hard of hearing, Pain and light touch intact in extremities, Motor exam as listed above     Non-Invasive Vascular Imaging  AAA Duplex (02/05/2014) ABDOMINAL AORTA DUPLEX EVALUATION    INDICATION: Abdominal aortic aneurysm    PREVIOUS INTERVENTION(S): N/A    DUPLEX EXAM:     LOCATION DIAMETER AP (cm) DIAMETER TRANSVERSE (cm) VELOCITIES (cm/sec)  Aorta Proximal 2.64 2.75 38  Aorta Mid 2.14 2.29 58  Aorta Distal 3.54 3.77 68  Right Common Iliac Artery 1.31 1.20 123  Left Common Iliac Artery 1.33 1.28 58    Previous max aortic diameter:  3.9cm x 3.7cm Date: 01/30/2013     ADDITIONAL FINDINGS:     IMPRESSION: Abdominal aortic aneurysm present measuring 3.54cm AP x 3.77cm TRV, no intramural thrombus present.    Compared to the previous exam:  Essentially stable and unchanged since previous studies on 01/25/2012 and 01/30/2013.     Medical Decision Making  The patient is a 68 y.o. male who presents with asymptomatic AAA with no increase in size.   Based on this patient's exam and diagnostic studies, the patient will follow up in 1 year  with the following studies: AAA Duplex.  Consideration for repair of AAA would be made when the size is 5.5 cm, growth > 1 cm/yr, and symptomatic status.  I emphasized the importance of maximal medical management including strict control of blood pressure, blood glucose, and lipid levels, antiplatelet agents, obtaining regular exercise, and continued  cessation of smoking.   The patient was given information about AAA including signs, symptoms, treatment, and how to minimize the risk of enlargement and rupture of aneurysms.    The patient was  advised to call 911 should the patient experience sudden onset abdominal or back pain.   Thank you for allowing [place] to participate in this patient's care.  Clemon Chambers, RN, MSN, FNP-C [facility] [place] Office: [phone]  Clinic Physician: Early  02/05/2014, 10:50 PM

## 2014-02-05 NOTE — Patient Instructions (Signed)
Abdominal Aortic Aneurysm ° Blood pumps away from the heart through tubes (blood vessels) called arteries. Aneurysms are weak or damaged places in the wall of an artery. It bulges out like a balloon. An abdominal aortic aneurysm happens in the main artery of the body (aorta). It can burst or tear, causing bleeding inside the body. This is an emergency. It needs treatment right away. °CAUSES  °The exact cause is unknown. Things that could cause this problem include: °· Fat and other substances building up in the lining of a tube. °· Swelling of the walls of a blood vessel. °· Certain tissue diseases. °· Belly (abdominal) trauma. °· An infection in the main artery of the body. °RISK FACTORS °There are things that make it more likely for you to have an aneurysm. These include: °· Being over the age of 68 years old. °· Having high blood pressure (hypertension). °· Being a male. °· Being white. °· Being very overweight (obese). °· Having a family history of aneurysm. °· Using tobacco products. °PREVENTION °To lessen your chance of getting this condition: °· Stop smoking. Stop chewing tobacco. °· Limit or avoid alcohol. °· Keep your blood pressure, blood sugar, and cholesterol within normal limits. °· Eat less salt. °· Eat foods low in saturated fats and cholesterol. These are found in animal and whole dairy products.  °· Eat more fiber. Fiber is found in whole grains, vegetables, and fruits. °· Keep a healthy weight. °· Stay active and exercise often. °SYMPTOMS °Symptoms depend on the size of the aneurysm and how fast it grows. There may not be symptoms. If symptoms occur, they can include: °· Pain (belly, side, lower back, or groin). °· Feeling full after eating a small amount of food. °· Feeling sick to your stomach (nauseous), throwing up (vomiting), or both. °· Feeling a lump in your belly that feels like it is beating (pulsating). °· Feeling like you will pass out (faint). °TREATMENT  °· Medicine to control blood  pressure and pain. °· Imaging tests to see if the aneurysm gets bigger. °· Surgery. °MAKE SURE YOU:  °· Understand these instructions. °· Will watch your condition. °· Will get help right away if you are not doing well or get worse. °Document Released: 08/21/2012 Document Reviewed: 08/21/2012 °[employer]® Patient Information ©2015 [employer]. This information is not intended to replace advice given to you by your health care provider. Make sure you discuss any questions you have with your health care provider. ° °

## 2014-02-25 ENCOUNTER — Other Ambulatory Visit: Payer: Self-pay | Admitting: Cardiovascular Disease

## 2014-03-28 LAB — LIPID PANEL
CHOLESTEROL: 131 mg/dL (ref 0–200)
HDL: 47 mg/dL (ref 35–70)
LDL Cholesterol: 68 mg/dL
LDl/HDL Ratio: 1.4
Triglycerides: 82 mg/dL (ref 40–160)

## 2014-03-28 LAB — HEMOGLOBIN A1C: HEMOGLOBIN A1C: 9.2 % — AB (ref 4.0–6.0)

## 2014-03-28 LAB — BASIC METABOLIC PANEL
BUN: 14 mg/dL (ref 4–21)
Creatinine: 1.4 mg/dL — AB (ref 0.6–1.3)
GLUCOSE: 150 mg/dL
Potassium: 4.4 mmol/L (ref 3.4–5.3)
Sodium: 142 mmol/L (ref 137–147)

## 2014-03-28 LAB — CBC AND DIFFERENTIAL
HEMATOCRIT: 45 % (ref 41–53)
PLATELETS: 169 10*3/uL (ref 150–399)
WBC: 7.1 10*3/mL

## 2014-03-28 LAB — HEPATIC FUNCTION PANEL
ALT: 21 U/L (ref 10–40)
AST: 14 U/L (ref 14–40)
BILIRUBIN, TOTAL: 0.5 mg/dL

## 2014-04-12 LAB — HM DIABETES EYE EXAM

## 2014-04-15 ENCOUNTER — Encounter: Payer: Self-pay | Admitting: Internal Medicine

## 2014-04-15 ENCOUNTER — Telehealth: Payer: Self-pay | Admitting: Internal Medicine

## 2014-04-15 ENCOUNTER — Ambulatory Visit (INDEPENDENT_AMBULATORY_CARE_PROVIDER_SITE_OTHER): Payer: Medicare Other | Admitting: Internal Medicine

## 2014-04-15 ENCOUNTER — Other Ambulatory Visit: Payer: Self-pay | Admitting: *Deleted

## 2014-04-15 VITALS — BP 118/76 | HR 93 | Temp 97.6°F | Resp 12 | Ht 73.0 in | Wt 240.0 lb

## 2014-04-15 DIAGNOSIS — E1165 Type 2 diabetes mellitus with hyperglycemia: Secondary | ICD-10-CM

## 2014-04-15 DIAGNOSIS — E1159 Type 2 diabetes mellitus with other circulatory complications: Secondary | ICD-10-CM

## 2014-04-15 DIAGNOSIS — IMO0002 Reserved for concepts with insufficient information to code with codable children: Secondary | ICD-10-CM

## 2014-04-15 DIAGNOSIS — E1151 Type 2 diabetes mellitus with diabetic peripheral angiopathy without gangrene: Secondary | ICD-10-CM

## 2014-04-15 MED ORDER — GLIPIZIDE-METFORMIN HCL 2.5-500 MG PO TABS: 2.0000 | ORAL_TABLET | Freq: Two times a day (BID) | ORAL | Status: DC

## 2014-04-15 MED ORDER — INSULIN PEN NEEDLE 32G X 4 MM MISC: Status: DC

## 2014-04-15 MED ORDER — INSULIN DETEMIR 100 UNIT/ML FLEXPEN: 15.0000 [IU] | PEN_INJECTOR | Freq: Every day | SUBCUTANEOUS | Status: DC

## 2014-04-15 MED ORDER — INSULIN GLARGINE 100 UNIT/ML SOLOSTAR PEN: 15.0000 [IU] | PEN_INJECTOR | Freq: Every day | SUBCUTANEOUS | Status: DC

## 2014-04-15 NOTE — Progress Notes (Signed)
Patient ID: [name], male   DOB: 12-Dec-1945, 68 y.o.   MRN: [id number]  HPI: [name] is a 68 y.o.-year-old male, referred by his PCP, Dr. Lisbeth Ply, for management of DM2, dx 1995, non-insulin-dependent, uncontrolled, with complications (CKD stage 3, CAD - AMI, s/p stent LAD 2001). His wife, [name], please also patient of mine, accompanies him today, and offers part of the history.  Last hemoglobin A1c was: Lab Results  Component Value Date   HGBA1C 9.2* 03/28/2014   Pt is on a regimen of: - On Glipizide - Metformin 2.5 - 500 mg po bid - Jardiance 25 daily >> will not be covered by insurance in 2016. He was on Levemir (40 >> 70 units a day) in the past, 1 year ago.  Pt checks his sugars 1x a day and they are: - am: 165-190 - 2h after b'fast: n/c  - before lunch: 204 - 2h after lunch: n/c - before dinner: n/c - 2h after dinner: n/c - bedtime: 225 - nighttime: n/c No lows. Lowest sugar was 149; ? if hypoglycemia awareness.  Highest sugar was 225.  Pt's meals are: - Breakfast: eggs, toast, ham/sausage cereal - Lunch: sandwich, vegetables, meat - Dinner: seafood, meat, vegetables - Snacks: nabs  He exercises 2x a week.  - + CKD stage 3, last BUN/creatinine:  Lab Results  Component Value Date   BUN 14 03/28/2014   CREATININE 1.4* 03/28/2014  On ramipril - last set of lipids: Lab Results  Component Value Date   CHOL 131 03/28/2014   HDL 47 03/28/2014   LDLCALC 68 03/28/2014   TRIG 82 03/28/2014  On Lipitor 20. - last eye exam was in 04/12/2014 >> No DR. - no numbness and tingling in his feet.  Pt has FH of DM in sisters, uncle.  ROS: Constitutional: no weight gain/loss, no fatigue, no subjective hyperthermia/hypothermia Eyes: no blurry vision, no xerophthalmia ENT: no sore throat, no nodules palpated in throat, no dysphagia/odynophagia, no hoarseness, + decreased hearing Cardiovascular: no CP/SOB/palpitations/leg swelling Respiratory: no  cough/SOB Gastrointestinal: no N/V/+D/no C Musculoskeletal: no muscle/joint aches Skin: no rashes Neurological: no tremors/numbness/tingling/dizziness Psychiatric: no depression/anxiety  Past Medical History  Diagnosis Date  . Coronary artery disease     post bare-metal stent to LAD in 2001  . Diabetes mellitus   . Hypertension   . Myocardial infarct 2001  . Atrial fibrillation October 2013    Xarelto started; duration unknown  . Obesity   . Chronic kidney disease     Stage III    Past Surgical History  Procedure Laterality Date  . Coronary angioplasty with stent placement  2001    bare-metal stent to the LAD  . Cardiac catheterization  2006    stable with primarily nonobstructive disease  . Skin graft  1981  . Hernia repair  2006  . Cardioversion  03/27/2012    Procedure: CARDIOVERSION;  Surgeon: Carlena Bjornstad, MD;  Location[facility] ENDOSCOPY;  Service: Cardiovascular;  Laterality: N/A;   Social History Main Topics  . Smoking status: Former Smoker -- 0.50 packs/day for 40 years    Types: Cigarettes, Pipe, Cigars    Quit date: 05/11/1999  . Smokeless tobacco: Never Used  . Alcohol Use: No  . Drug Use: No   Social History Narrative   Married    Retired: [employer]   3 grown children   Current Outpatient Prescriptions on File Prior to Visit  Medication Sig Dispense Refill  . atorvastatin (LIPITOR) 10 MG  tablet Take 10 mg by mouth 2 (two) times daily.     . Empagliflozin (JARDIANCE) 10 MG TABS Take 25 mg by mouth daily.     Marland Kitchen glipiZIDE-metformin (METAGLIP) 2.5-500 MG per tablet Take 2 tablets by mouth 2 (two) times daily before a meal.    . metoprolol tartrate (LOPRESSOR) 25 MG tablet TAKE 1 TABLET BY MOUTH TWICE A DAY 180 tablet 3  . nitroGLYCERIN (NITROSTAT) 0.4 MG SL tablet Place 0.4 mg under the tongue every 5 (five) minutes as needed.    Marland Kitchen omeprazole (PRILOSEC) 20 MG capsule Take 20 mg by mouth daily.    . ramipril (ALTACE) 10 MG capsule Take 10 mg by mouth daily.    Alveda Reasons 20 MG TABS tablet TAKE 1 TABLET BY MOUTH EVERY DAY 90 tablet 1   No current facility-administered medications on file prior to visit.   No Known Allergies Family History  Problem Relation Age of Onset  . Heart disease Father   . ALS Mother   . Diabetes Sister   . Hypertension Sister   . Heart failure Brother    PE: BP 118/76 mmHg  Pulse 93  Temp(Src) 97.6 F (36.4 C) (Oral)  Resp 12  Ht 6\' 1"  (1.854 m)  Wt 240 lb (108.863 kg)  BMI 31.67 kg/m2  SpO2 98% Wt Readings from Last 3 Encounters:  04/15/14 240 lb (108.863 kg)  02/05/14 241 lb (109.317 kg)  08/10/13 239 lb 12.8 oz (108.773 kg)   Constitutional: overweight, in NAD Eyes: PERRLA, EOMI, no exophthalmos ENT: moist mucous membranes, no thyromegaly, no cervical lymphadenopathy Cardiovascular: RRR, No MRG Respiratory: CTA B Gastrointestinal: abdomen soft, NT, ND, BS+ Musculoskeletal: no deformities, strength intact in all 4 Skin: moist, warm, no rashes Neurological: no tremor with outstretched hands, DTR normal in all 4  ASSESSMENT: 1. DM2, non-insulin-dependent, uncontrolled, without complications  PLAN:  1. Patient with long-standing, uncontrolled diabetes, on oral antidiabetic regimen, which became insufficient. We also have to stop Jardiance since this will not be covered by the insurance in 2016. - We discussed about options for treatment, and I suggested to:  Patient Instructions  Please continue metformin-glipizide 500-2.5 mg x2 tablets twice a day for now. Please start Lantus 15 units at bedtime. Please stop Jardiance. Please let me know if the sugars are consistently <80 or >200. Please return in 1 month with your sugar log.  - discussed about proper inj technique: When injecting insulin:  Inject in the abdomen  Rotate the injection sites around the belly button  Change needle for each injection  Keep needle in for 6 sec after last unit of insulin in - We are limited in our treatment options  by the insurance coverage and also by his kidney function. We can increase the glipizide to 10 mg twice a day, if we need to. DPP 4 inhibitors are clear to, per review of insurance coverage list that patient brings with him. - Strongly advised him to start checking sugars at different times of the day - check 2 times a day, rotating checks - given sugar log and advised how to fill it and to bring it at next appt  - given foot care handout and explained the principles  - given instructions for hypoglycemia management "15-15 rule"  - advised for yearly eye exams, he is UTD - had flu and PNA vaccine this season - Return to clinic in 1 mo with sugar log

## 2014-04-15 NOTE — Patient Instructions (Addendum)
Please continue metformin-glipizide 500-2.5 mg x2 tablets twice a day for now. Please start Lantus 15 units at bedtime. Stop Jardiance.  Please let me know if the sugars are consistently <80 or >200.  Please return in 1 month with your sugar log.   PATIENT INSTRUCTIONS FOR TYPE 2 DIABETES:  **Please join MyChart!** - see attached instructions about how to join if you have not done so already.  DIET AND EXERCISE Diet and exercise is an important part of diabetic treatment.  We recommended aerobic exercise in the form of brisk walking (working between 40-60% of maximal aerobic capacity, similar to brisk walking) for 150 minutes per week (such as 30 minutes five days per week) along with 3 times per week performing 'resistance' training (using various gauge rubber tubes with handles) 5-10 exercises involving the major muscle groups (upper body, lower body and core) performing 10-15 repetitions (or near fatigue) each exercise. Start at half the above goal but build slowly to reach the above goals. If limited by weight, joint pain, or disability, we recommend daily walking in a swimming pool with water up to waist to reduce pressure from joints while allow for adequate exercise.    BLOOD GLUCOSES Monitoring your blood glucoses is important for continued management of your diabetes. Please check your blood glucoses 2-4 times a day: fasting, before meals and at bedtime (you can rotate these measurements - e.g. one day check before the 3 meals, the next day check before 2 of the meals and before bedtime, etc.).   HYPOGLYCEMIA (low blood sugar) Hypoglycemia is usually a reaction to not eating, exercising, or taking too much insulin/ other diabetes drugs.  Symptoms include tremors, sweating, hunger, confusion, headache, etc. Treat IMMEDIATELY with 15 grams of Carbs: . 4 glucose tablets .  cup regular juice/soda . 2 tablespoons raisins . 4 teaspoons sugar . 1 tablespoon honey Recheck blood glucose  in 15 mins and repeat above if still symptomatic/blood glucose <100.  RECOMMENDATIONS TO REDUCE YOUR RISK OF DIABETIC COMPLICATIONS: * Take your prescribed MEDICATION(S) * Follow a DIABETIC diet: Complex carbs, fiber rich foods, (monounsaturated and polyunsaturated) fats * AVOID saturated/trans fats, high fat foods, >2,300 mg salt per day. * EXERCISE at least 5 times a week for 30 minutes or preferably daily.  * DO NOT SMOKE OR DRINK more than 1 drink a day. * Check your FEET every day. Do not wear tightfitting shoes. Contact us if you develop an ulcer * See your EYE doctor once a year or more if needed * Get a FLU shot once a year * Get a PNEUMONIA vaccine once before and once after age 31 years  GOALS:  * Your Hemoglobin A1c of <7%  * fasting sugars need to be <130 * after meals sugars need to be <180 (2h after you start eating) * Your Systolic BP should be 938 or lower  * Your Diastolic BP should be 80 or lower  * Your HDL (Good Cholesterol) should be 40 or higher  * Your LDL (Bad Cholesterol) should be 100 or lower. * Your Triglycerides should be 150 or lower  * Your Urine microalbumin (kidney function) should be <30 * Your Body Mass Index should be 25 or lower    Please consider the following ways to cut down carbs and fat and increase fiber and micronutrients in your diet: - substitute whole grain for white bread or pasta - substitute brown rice for white rice - substitute 90-calorie flat bread pieces for slices of  bread when possible - substitute sweet potatoes or yams for white potatoes - substitute humus for margarine - substitute tofu for cheese when possible - substitute almond or rice milk for regular milk (would not drink soy milk daily due to concern for soy estrogen influence on breast cancer risk) - substitute dark chocolate for other sweets when possible - substitute water - can add lemon or orange slices for taste - for diet sodas (artificial sweeteners will trick  your body that you can eat sweets without getting calories and will lead you to overeating and weight gain in the long run) - do not skip breakfast or other meals (this will slow down the metabolism and will result in more weight gain over time)  - can try smoothies made from fruit and almond/rice milk in am instead of regular breakfast - can also try old-fashioned (not instant) oatmeal made with almond/rice milk in am - order the dressing on the side when eating salad at a restaurant (pour less than half of the dressing on the salad) - eat as little meat as possible - can try juicing, but should not forget that juicing will get rid of the fiber, so would alternate with eating raw veg./fruits or drinking smoothies - use as little oil as possible, even when using olive oil - can dress a salad with a mix of balsamic vinegar and lemon juice, for e.g. - use agave nectar, stevia sugar, or regular sugar rather than artificial sweateners - steam or broil/roast veggies  - snack on veggies/fruit/nuts (unsalted, preferably) when possible, rather than processed foods - reduce or eliminate aspartame in diet (it is in diet sodas, chewing gum, etc) Read the labels!  Try to read Dr. Janene Harvey book: "Program for Reversing Diabetes" for other ideas for healthy eating.

## 2014-04-20 LAB — HM DIABETES EYE EXAM

## 2014-04-22 ENCOUNTER — Encounter: Payer: Self-pay | Admitting: Internal Medicine

## 2014-04-29 ENCOUNTER — Telehealth: Payer: Self-pay | Admitting: *Deleted

## 2014-04-29 NOTE — Telephone Encounter (Signed)
Be advised of below:  I contacted pt and advised to increase Levemir to 20 units at bedtime. Wife voiced understanding and stated that she would give the office a call back in 2 to 3 days if numbers have not come down.

## 2014-04-29 NOTE — Telephone Encounter (Signed)
If he is taking 15 units of Levemir he can take 20 units, confirm that he is doing this at bedtime

## 2014-04-29 NOTE — Telephone Encounter (Signed)
Patient wife stated that her husbands b/s haven't went down any. Please advise

## 2014-04-29 NOTE — Telephone Encounter (Signed)
Called and spoke with pt's wife. His blood sugar readings are as follows:  12/21 192 - AM 12/20  197 - AM (no pm) 12/19 211 - AM  151 - PM (before dinner) 12/18 209 - AM  151 - PM (before dinner) 12/17 175 - AM  172 - PM 12/16 185 - AM (no pm) 12/15 183 - AM  213 - before lunch  163 - before dinner   Please review and advise in Dr Arman Filter absence. Thank you.

## 2014-04-30 NOTE — Telephone Encounter (Signed)
Opened encounter in error  

## 2014-05-16 ENCOUNTER — Encounter: Payer: Self-pay | Admitting: Internal Medicine

## 2014-05-16 ENCOUNTER — Ambulatory Visit (INDEPENDENT_AMBULATORY_CARE_PROVIDER_SITE_OTHER): Payer: Medicare Other | Admitting: Internal Medicine

## 2014-05-16 VITALS — BP 102/62 | HR 84 | Temp 98.2°F | Resp 14 | Wt 242.0 lb

## 2014-05-16 DIAGNOSIS — E1159 Type 2 diabetes mellitus with other circulatory complications: Secondary | ICD-10-CM

## 2014-05-16 DIAGNOSIS — E1165 Type 2 diabetes mellitus with hyperglycemia: Secondary | ICD-10-CM

## 2014-05-16 DIAGNOSIS — IMO0002 Reserved for concepts with insufficient information to code with codable children: Secondary | ICD-10-CM

## 2014-05-16 DIAGNOSIS — E1151 Type 2 diabetes mellitus with diabetic peripheral angiopathy without gangrene: Secondary | ICD-10-CM

## 2014-05-16 MED ORDER — LINAGLIPTIN 5 MG PO TABS: 5.0000 mg | ORAL_TABLET | Freq: Every day | ORAL | Status: DC

## 2014-05-16 NOTE — Progress Notes (Signed)
Patient ID: [name], male   DOB: 1945/12/13, 69 y.o.   MRN: [id number]  HPI: [name] is a 69 y.o.-year-old male, initially referred by his PCP, Dr. Lisbeth Bartlett, for management of DM2, dx 1995, non-insulin-dependent, uncontrolled, with complications (CKD stage 3, CAD - AMI, s/p stent LAD 2001). His wife, [name], please also patient of mine, accompanies him today, and offers part of the history. Last visit 1 mo ago.  Last hemoglobin A1c was: Lab Results  Component Value Date   HGBA1C 9.2* 03/28/2014   Pt was on a regimen of: - On Glipizide - Metformin 2.5 - 500 mg po bid - Jardiance 25 daily >> will not be covered by insurance in 2016. He was on Levemir (40 >> 70 units a day) in the past, 1 year ago.  He is now on: Metformin-glipizide 500-2.5 mg x2 tablets bid Lantus 15 >> 20 units at bedtime. We stopped Jardiance.  Pt checks his sugars 1x a day and they are ~ same: - am: 165-190 >> 118-192, 207 - 2h after b'fast: n/c  - before lunch: 204 >> 136-165, 213 - 2h after lunch: 214 - before dinner: n/c >> 116-163 - 2h after dinner: n/c >> 175, 216 - bedtime: 225 >> 135, 146-250 - nighttime: n/c No lows. Lowest sugar was 118; ? if hypoglycemia awareness.  Highest sugar was 250  Pt's meals are: - Breakfast: eggs, toast, ham/sausage cereal - Lunch: sandwich, vegetables, meat - Dinner: seafood, meat, vegetables - Snacks: nabs  He exercises 2x a week.  - + CKD stage 3, last BUN/creatinine:  Lab Results  Component Value Date   BUN 14 03/28/2014   CREATININE 1.4* 03/28/2014  On ramipril - last set of lipids: Lab Results  Component Value Date   CHOL 131 03/28/2014   HDL 47 03/28/2014   LDLCALC 68 03/28/2014   TRIG 82 03/28/2014  On Lipitor 20. - last eye exam was in 04/12/2014 >> No DR. - no numbness and tingling in his feet.  ROS: Constitutional: no weight gain/loss, no fatigue, no subjective hyperthermia/hypothermia Eyes: no blurry vision, no  xerophthalmia ENT: no sore throat, no nodules palpated in throat, no dysphagia/odynophagia, no hoarseness, + decreased hearing Cardiovascular: no CP/SOB/palpitations/leg swelling Respiratory: no cough/SOB Gastrointestinal: no N/V/D/no C Musculoskeletal: no muscle/joint aches Skin: no rashes Neurological: no tremors/numbness/tingling/dizziness  I reviewed pt's medications, allergies, PMH, social hx, family hx, and changes were documented in the history of present illness. Otherwise, unchanged from my initial visit note:  Past Medical History  Diagnosis Date  . Coronary artery disease     post bare-metal stent to LAD in 2001  . Diabetes mellitus   . Hypertension   . Myocardial infarct 2001  . Atrial fibrillation October 2013    Xarelto started; duration unknown  . Obesity   . Chronic kidney disease     Stage III    Past Surgical History  Procedure Laterality Date  . Coronary angioplasty with stent placement  2001    bare-metal stent to the LAD  . Cardiac catheterization  2006    stable with primarily nonobstructive disease  . Skin graft  1981  . Hernia repair  2006  . Cardioversion  03/27/2012    Procedure: CARDIOVERSION;  Surgeon: Carlena Bjornstad, MD;  Location: [facility] ENDOSCOPY;  Service: Cardiovascular;  Laterality: N/A;   Social History Main Topics  . Smoking status: Former Smoker -- 0.50 packs/day for 40 years    Types: Cigarettes, Pipe, Cigars  Quit date: 05/11/1999  . Smokeless tobacco: Never Used  . Alcohol Use: No  . Drug Use: No   Social History Narrative   Married    Retired: [employer]   3 grown children   Current Outpatient Prescriptions on File Prior to Visit  Medication Sig Dispense Refill  . AFLURIA PRESERVATIVE FREE 0.5 ML SUSY   0  . atorvastatin (LIPITOR) 10 MG tablet Take 10 mg by mouth 2 (two) times daily.     Marland Kitchen glipiZIDE-metformin (METAGLIP) 2.5-500 MG per tablet Take 2 tablets by mouth 2 (two) times daily before a meal. 360 tablet 1  . Insulin Detemir  (LEVEMIR FLEXTOUCH) 100 UNIT/ML Pen Inject 15 Units into the skin at bedtime. 15 mL 3  . Insulin Pen Needle 32G X 4 MM MISC Use to inject insulin 1 time daily as instructed. 90 each 3  . metoprolol tartrate (LOPRESSOR) 25 MG tablet TAKE 1 TABLET BY MOUTH TWICE A DAY 180 tablet 3  . nitroGLYCERIN (NITROSTAT) 0.4 MG SL tablet Place 0.4 mg under the tongue every 5 (five) minutes as needed.    Marland Kitchen omeprazole (PRILOSEC) 20 MG capsule Take 20 mg by mouth daily.    . ramipril (ALTACE) 10 MG capsule Take 10 mg by mouth daily.    Alveda Reasons 20 MG TABS tablet TAKE 1 TABLET BY MOUTH EVERY DAY 90 tablet 1   No current facility-administered medications on file prior to visit.   No Known Allergies Family History  Problem Relation Age of Onset  . Heart disease Father   . ALS Mother   . Diabetes Sister   . Hypertension Sister   . Heart failure Brother    PE: BP 102/62 mmHg  Pulse 84  Temp(Src) 98.2 F (36.8 C) (Oral)  Resp 14  Wt 242 lb (109.77 kg)  SpO2 95% Wt Readings from Last 3 Encounters:  05/16/14 242 lb (109.77 kg)  04/15/14 240 lb (108.863 kg)  02/05/14 241 lb (109.317 kg)   Constitutional: overweight, in NAD Eyes: PERRLA, EOMI, no exophthalmos ENT: moist mucous membranes, no thyromegaly, no cervical lymphadenopathy Cardiovascular: RRR, No MRG Respiratory: CTA B Gastrointestinal: abdomen soft, NT, ND, BS+ Musculoskeletal: no deformities, strength intact in all 4 Skin: moist, warm, no rashes Neurological: no tremor with outstretched hands, DTR normal in all 4  ASSESSMENT: 1. DM2, non-insulin-dependent, uncontrolled, without complications  PLAN:  1. Patient with long-standing, uncontrolled diabetes, on oral antidiabetic regimen + basal insulin (added at last visit), with still suboptimal control. We had to stop Jardiance since this is not be covered by the insurance in 2016. We will add Tradjenta 5 mg in am. - I suggested to:  Patient Instructions  Please continue: -  Metformin-glipizide 500-2.5 mg x2 tablets 2x a day - Lantus 20 units at bedtime. Please add Tradjenta 5 mg in am.  Please return in 1.5 months with your sugar log.  - We are limited in our treatment options by the insurance coverage and also by his kidney function. We can increase the glipizide to 10 mg twice a day, if we need to. DPP 4 inhibitors are covered, per review of insurance coverage list that patient brings with him. - continue checking sugars at different times of the day - check 2 times a day, rotating checks - advised for yearly eye exams, he is UTD - had flu and PNA vaccine this season - Return to clinic in 1.5 mo with sugar log- will check HbA1c then

## 2014-05-16 NOTE — Patient Instructions (Signed)
Please continue: - Metformin-glipizide 500-2.5 mg x2 tablets 2x a day - Lantus 20 units at bedtime. Please add Tradjenta 5 mg in am.  Please return in 1.5 months with your sugar log.

## 2014-07-01 ENCOUNTER — Ambulatory Visit (INDEPENDENT_AMBULATORY_CARE_PROVIDER_SITE_OTHER): Payer: Medicare Other | Admitting: Internal Medicine

## 2014-07-01 ENCOUNTER — Encounter: Payer: Self-pay | Admitting: Internal Medicine

## 2014-07-01 VITALS — BP 112/64 | HR 82 | Temp 97.4°F | Resp 12 | Wt 236.0 lb

## 2014-07-01 DIAGNOSIS — IMO0002 Reserved for concepts with insufficient information to code with codable children: Secondary | ICD-10-CM

## 2014-07-01 DIAGNOSIS — E1165 Type 2 diabetes mellitus with hyperglycemia: Secondary | ICD-10-CM

## 2014-07-01 DIAGNOSIS — E1159 Type 2 diabetes mellitus with other circulatory complications: Secondary | ICD-10-CM

## 2014-07-01 DIAGNOSIS — E1151 Type 2 diabetes mellitus with diabetic peripheral angiopathy without gangrene: Secondary | ICD-10-CM

## 2014-07-01 LAB — HEMOGLOBIN A1C: Hgb A1c MFr Bld: 7.2 % — ABNORMAL HIGH (ref 4.6–6.5)

## 2014-07-01 MED ORDER — INSULIN DETEMIR 100 UNIT/ML FLEXPEN: 20.0000 [IU] | PEN_INJECTOR | Freq: Every day | SUBCUTANEOUS | Status: DC

## 2014-07-01 NOTE — Patient Instructions (Signed)
Please continue: - Metformin-glipizide 500-2.5 mg x2 tablets 2x a day - Lantus 20 units at bedtime. - Tradjenta 5 mg in am.  Please return in 3 months with your sugar log.  Please stop at the lab.

## 2014-07-01 NOTE — Progress Notes (Signed)
Patient ID: [name], male   DOB: 1945/08/21, 69 y.o.   MRN: [id number]  HPI: [name] is a 69 y.o.-year-old male, initially referred by his PCP, Dr. Lisbeth Bartlett, for management of DM2, dx 1995, non-insulin-dependent, uncontrolled, with complications (CKD stage 3, CAD - AMI, s/p stent LAD 2001). His wife, [name], please also patient of mine, accompanies him today, and offers part of the history. Last visit 1.5 mo ago.  Last hemoglobin A1c was: Lab Results  Component Value Date   HGBA1C 9.2* 03/28/2014   Pt was on a regimen of: - On Glipizide - Metformin 2.5 - 500 mg po bid - Jardiance 25 daily >> will not be covered by insurance in 2016. He was on Levemir (40 >> 70 units a day) in the past, 1 year ago.  He is now on: Metformin-glipizide 500-2.5 mg x2 tablets bid Levemir 20 units at bedtime. Tradjenta 5 mg in am (added 05/2014) We stopped Jardiance.  Pt checks his sugars 1x a day and they are better (still some highs in am and 2h after dinner, but they decrease: - am: 165-190 >> 118-192, 207 >> 120-156, 187 - 2h after b'fast: n/c >> 131 - before lunch: 204 >> 136-165, 213 >> 91-136, 171 - 2h after lunch: 214 >> 132 - before dinner: n/c >> 116-163 >> 87-148 - 2h after dinner: n/c >> 175, 216 >> 108, 183-216 - bedtime: 225 >> 135, 146-250 >> 94, 111-162, 180 - nighttime: n/c No lows. Lowest sugar was 118; ? if hypoglycemia awareness.  Highest sugar was 250  Pt's meals are: - Breakfast: eggs, toast, ham/sausage cereal - Lunch: sandwich, vegetables, meat - Dinner: seafood, meat, vegetables - Snacks: nabs  He exercises 2x a week.  - + CKD stage 3, last BUN/creatinine:  Lab Results  Component Value Date   BUN 14 03/28/2014   CREATININE 1.4* 03/28/2014  On ramipril - last set of lipids: Lab Results  Component Value Date   CHOL 131 03/28/2014   HDL 47 03/28/2014   LDLCALC 68 03/28/2014   TRIG 82 03/28/2014  On Lipitor 20. - last eye exam was in 04/12/2014 >>  No DR. - no numbness and tingling in his feet.  ROS: Constitutional: no weight gain/loss, no fatigue, no subjective hyperthermia/hypothermia Eyes: no blurry vision, no xerophthalmia ENT: no sore throat, no nodules palpated in throat, no dysphagia/odynophagia, no hoarseness, + decreased hearing Cardiovascular: no CP/SOB/palpitations/leg swelling Respiratory: no cough/SOB Gastrointestinal: no N/V/D/no C Musculoskeletal: no muscle/joint aches Skin: no rashes Neurological: no tremors/numbness/tingling/dizziness  I reviewed pt's medications, allergies, PMH, social hx, family hx, and changes were documented in the history of present illness. Otherwise, unchanged from my initial visit note:  Past Medical History  Diagnosis Date  . Coronary artery disease     post bare-metal stent to LAD in 2001  . Diabetes mellitus   . Hypertension   . Myocardial infarct 2001  . Atrial fibrillation October 2013    Xarelto started; duration unknown  . Obesity   . Chronic kidney disease     Stage III    Past Surgical History  Procedure Laterality Date  . Coronary angioplasty with stent placement  2001    bare-metal stent to the LAD  . Cardiac catheterization  2006    stable with primarily nonobstructive disease  . Skin graft  1981  . Hernia repair  2006  . Cardioversion  03/27/2012    Procedure: CARDIOVERSION;  Surgeon: Richard Bjornstad, MD;  Location: [facility]  ENDOSCOPY;  Service: Cardiovascular;  Laterality: N/A;   Social History Main Topics  . Smoking status: Former Smoker -- 0.50 packs/day for 40 years    Types: Cigarettes, Pipe, Cigars    Quit date: 05/11/1999  . Smokeless tobacco: Never Used  . Alcohol Use: No  . Drug Use: No   Social History Narrative   Married    Retired: [employer]   3 grown children   Current Outpatient Prescriptions on File Prior to Visit  Medication Sig Dispense Refill  . AFLURIA PRESERVATIVE FREE 0.5 ML SUSY   0  . atorvastatin (LIPITOR) 10 MG tablet Take 10 mg by mouth  2 (two) times daily.     Marland Kitchen glipiZIDE-metformin (METAGLIP) 2.5-500 MG per tablet Take 2 tablets by mouth 2 (two) times daily before a meal. 360 tablet 1  . Insulin Detemir (LEVEMIR FLEXTOUCH) 100 UNIT/ML Pen Inject 15 Units into the skin at bedtime. 15 mL 3  . Insulin Pen Needle 32G X 4 MM MISC Use to inject insulin 1 time daily as instructed. 90 each 3  . linagliptin (TRADJENTA) 5 MG TABS tablet Take 1 tablet (5 mg total) by mouth daily. 30 tablet 1  . metoprolol tartrate (LOPRESSOR) 25 MG tablet TAKE 1 TABLET BY MOUTH TWICE A DAY 180 tablet 3  . nitroGLYCERIN (NITROSTAT) 0.4 MG SL tablet Place 0.4 mg under the tongue every 5 (five) minutes as needed.    Marland Kitchen omeprazole (PRILOSEC) 20 MG capsule Take 20 mg by mouth daily.    . ramipril (ALTACE) 10 MG capsule Take 10 mg by mouth daily.    [name] 20 MG TABS tablet TAKE 1 TABLET BY MOUTH EVERY DAY 90 tablet 1   No current facility-administered medications on file prior to visit.   No Known Allergies Family History  Problem Relation Age of Onset  . Heart disease Father   . ALS Mother   . Diabetes Sister   . Hypertension Sister   . Heart failure Brother    PE: BP 112/64 mmHg  Pulse 82  Temp(Src) 97.4 F (36.3 C) (Oral)  Resp 12  Wt 236 lb (107.049 kg)  SpO2 97% Wt Readings from Last 3 Encounters:  07/01/14 236 lb (107.049 kg)  05/16/14 242 lb (109.77 kg)  04/15/14 240 lb (108.863 kg)   Constitutional: overweight, in NAD Eyes: PERRLA, EOMI, no exophthalmos ENT: moist mucous membranes, no thyromegaly, no cervical lymphadenopathy Cardiovascular: RRR, No MRG Respiratory: CTA B Gastrointestinal: abdomen soft, NT, ND, BS+ Musculoskeletal: no deformities, strength intact in all 4 Skin: moist, warm, no rashes Neurological: no tremor with outstretched hands, DTR normal in all 4  ASSESSMENT: 1. DM2, non-insulin-dependent, uncontrolled, without complications  PLAN:  1. Patient with long-standing, uncontrolled diabetes, on oral  antidiabetic regimen + basal insulin, now also on Tradjenta 5 mg in am. Sugars are better! - I suggested to:  Patient Instructions  Please continue: - Metformin-glipizide 500-2.5 mg x2 tablets 2x a day - Lantus 20 units at bedtime. - Tradjenta 5 mg in am.  Please return in 3 months with your sugar log.  Please stop at the lab.  - We are limited in our treatment options by the insurance coverage and also by his kidney function. We can increase the glipizide to 10 mg twice a day, if we need to.  - continue checking sugars at different times of the day - check 2 times a day, rotating checks - advised for yearly eye exams, he is UTD - had flu and  PNA vaccine this season - will check HbA1c today - Return to clinic in 3 months  Office Visit on 07/01/2014  Component Date Value Ref Range Status  . Hgb A1c MFr Bld 07/01/2014 7.2* 4.6 - 6.5 % Final   Glycemic Control Guidelines for People with Diabetes:Non Diabetic:  <6%Goal of Therapy: <7%Additional Action Suggested:  >8%    Excellent improvement in his hemoglobin A1c!

## 2014-07-08 ENCOUNTER — Other Ambulatory Visit: Payer: Self-pay | Admitting: Internal Medicine

## 2014-08-15 ENCOUNTER — Encounter: Payer: Self-pay | Admitting: Cardiovascular Disease

## 2014-08-15 ENCOUNTER — Ambulatory Visit (INDEPENDENT_AMBULATORY_CARE_PROVIDER_SITE_OTHER): Payer: Medicare Other | Admitting: Cardiovascular Disease

## 2014-08-15 VITALS — BP 110/72 | HR 81 | Ht 73.0 in | Wt 236.0 lb

## 2014-08-15 DIAGNOSIS — I2510 Atherosclerotic heart disease of native coronary artery without angina pectoris: Secondary | ICD-10-CM | POA: Diagnosis not present

## 2014-08-15 DIAGNOSIS — I481 Persistent atrial fibrillation: Secondary | ICD-10-CM | POA: Diagnosis not present

## 2014-08-15 DIAGNOSIS — I10 Essential (primary) hypertension: Secondary | ICD-10-CM

## 2014-08-15 DIAGNOSIS — I4819 Other persistent atrial fibrillation: Secondary | ICD-10-CM

## 2014-08-15 NOTE — Patient Instructions (Signed)
Medication Instructions:  Your physician recommends that you continue on your current medications as directed. Please refer to the Current Medication list given to you today.   Labwork: None  Testing/Procedures: None  Follow-Up: Your physician wants you to follow-up in: 12 months.  You will receive a reminder letter in the mail two months in advance. If you don't receive a letter, please call our office to schedule the follow-up appointment.

## 2014-08-15 NOTE — Progress Notes (Signed)
Chief Complaint  Patient presents with  . Coronary Artery Disease    History of Present Illness: 69 yo WM with history of CAD, HTN, hyperlipidemia, atrial fibrillation, AAA and DM who is here today for cardiac follow up. He has been followed in the past by Dr. Olevia Perches. In 2001 he had an anterior wall MI treated with a bare-metal stent to the LAD. He underwent catheterization in 2006 and the stent was patent and there was an 80% narrowing in the diagonal branch. His ejection fraction was 50%. He was noted to be in atrial fibrillation in October 2013 and was seen by Truitt Merle, NP. He was started on Xarelto and DCCV was arranged on 03/27/12 with return to [facility]. Echo 03/02/12 with moderate LVH, LVEF 40-45%, mild MR. AAA followed in [facility].   He is here today for follow up. He is doing well. He has had no chest pain or SOB. No palpitations, near syncope or syncope. He is taking his Xarelto and having no bleeding issues.   Primary Care Physician: Daiva Eves   Last Lipid Profile: Followed in primary care.   Past Medical History  Diagnosis Date  . Coronary artery disease     post bare-metal stent to LAD in 2001  . Diabetes mellitus   . Hypertension   . Myocardial infarct 2001  . Atrial fibrillation October 2013    Xarelto started; duration unknown  . Obesity   . Chronic kidney disease     Stage III     Past Surgical History  Procedure Laterality Date  . Coronary angioplasty with stent placement  2001    bare-metal stent to the LAD  . Cardiac catheterization  2006    stable with primarily nonobstructive disease  . Skin graft  1981  . Hernia repair  2006  . Cardioversion  03/27/2012    Procedure: CARDIOVERSION;  Surgeon: Carlena Bjornstad, MD;  Location: [facility] ENDOSCOPY;  Service: Cardiovascular;  Laterality: N/A;    Current Outpatient Prescriptions  Medication Sig Dispense Refill  . atorvastatin (LIPITOR) 20 MG tablet Take 20 mg by mouth 2 (two) times daily.     Marland Kitchen  glipiZIDE-metformin (METAGLIP) 2.5-500 MG per tablet Take 2 tablets by mouth 2 (two) times daily before a meal. 360 tablet 1  . Insulin Detemir (LEVEMIR FLEXTOUCH) 100 UNIT/ML Pen Inject 20 Units into the skin at bedtime. 15 mL 3  . Insulin Pen Needle 32G X 4 MM MISC Use to inject insulin 1 time daily as instructed. 90 each 3  . metoprolol tartrate (LOPRESSOR) 25 MG tablet TAKE 1 TABLET BY MOUTH TWICE A DAY 180 tablet 3  . nitroGLYCERIN (NITROSTAT) 0.4 MG SL tablet Place 0.4 mg under the tongue every 5 (five) minutes as needed.    Marland Kitchen omeprazole (PRILOSEC) 20 MG capsule Take 20 mg by mouth daily.    . ramipril (ALTACE) 10 MG capsule Take 10 mg by mouth daily.    . TRADJENTA 5 MG TABS tablet TAKE 1 TABLET (5 MG TOTAL) BY MOUTH DAILY. 30 tablet 1  . XARELTO 20 MG TABS tablet TAKE 1 TABLET BY MOUTH EVERY DAY 90 tablet 1   No current facility-administered medications for this visit.    Allergies  Allergen Reactions  . Omeprazole Diarrhea    History   Social History  . Marital Status: Married    Spouse Name: N/A  . Number of Children: N/A  . Years of Education: N/A   Occupational History  . Not on file.  Social History Main Topics  . Smoking status: Former Smoker -- 0.50 packs/day for 40 years    Types: Cigarettes, Pipe, Cigars    Quit date: 05/11/1999  . Smokeless tobacco: Never Used  . Alcohol Use: No  . Drug Use: No  . Sexual Activity: Yes   Other Topics Concern  . Not on file   Social History Narrative   Married    Retired: [employer]   3 grown children       Family History  Problem Relation Age of Onset  . Heart disease Father   . ALS Mother   . Diabetes Sister   . Hypertension Sister   . Heart failure Brother   . Heart attack Father   . Stroke Neg Hx     Review of Systems:  As stated in the HPI and otherwise negative.   BP 110/72 mmHg  Pulse 81  Ht 6\' 1"  (1.854 m)  Wt 236 lb (107.049 kg)  BMI 31.14 kg/m2  Physical Examination: General: Well developed,  well nourished, NAD HEENT: OP clear, mucus membranes moist SKIN: warm, dry. No rashes. Neuro: No focal deficits Musculoskeletal: Muscle strength 5/5 all ext Psychiatric: Mood and affect normal Neck: No JVD, no carotid bruits, no thyromegaly, no lymphadenopathy. Lungs:Clear bilaterally, no wheezes, rhonci, crackles Cardiovascular: Irregular irregular. No murmurs, gallops or rubs. Abdomen:Soft. Bowel sounds present. Non-tender.  Extremities: No lower extremity edema. Pulses are 2 + in the bilateral DP/PT.  Echo 03/02/12:  Left ventricle: The cavity size was normal. Wall thickness was increased in a pattern of moderate LVH. Systolic function was mildly to moderately reduced. The estimated ejection fraction was in the range of 40% to 45%. Wall motion was normal; there were no regional wall motion abnormalities. - Mitral valve: Mild regurgitation. - Left atrium: The atrium was moderately dilated. - Atrial septum: No defect or patent foramen ovale was identified. - Pulmonary arteries: PA peak pressure: 34mm Hg (S).  EKG:  EKG is ordered today. The ekg ordered today demonstrates Atrial fib with PVCs. Incomplete RBBB. Poor R wave progression  Recent Labs: 03/28/2014: ALT 21; BUN 14; Creatinine 1.4*; Platelets 169; Potassium 4.4; Sodium 142   Lipid Panel    Component Value Date/Time   CHOL 131 03/28/2014   TRIG 82 03/28/2014   HDL 47 03/28/2014   LDLCALC 68 03/28/2014     Wt Readings from Last 3 Encounters:  08/15/14 236 lb (107.049 kg)  07/01/14 236 lb (107.049 kg)  05/16/14 242 lb (109.77 kg)     Other studies Reviewed: Additional studies/ records that were reviewed today include:  Review of the above records demonstrates:  Assessment and Plan:   1. CAD: Stable. He had a stent placed in 2001. He has had no angina. Continue ASA, Ace-inhibitor and statin. Lipids are followed in primary care and controlled per pt. BP is at goal. No changes today.   2. Atrial fibrillation:  Asymptomatic. Rate controlled on Lopressor. Will continue Xarelto for anticoagulation. He does not wish to seek EP referral for ablation or other therapies.   3. HYPERTENSION: BP well controlled.   Current medicines are reviewed at length with the patient today.  The patient does not have concerns regarding medicines.  The following changes have been made:  no change  Labs/ tests ordered today include: No orders of the defined types were placed in this encounter.    Disposition:   FU with me in 12  months  Signed, Lauree Chandler, MD 08/15/2014 10:13 AM  [facility] [address], [place], [place]  [address] Phone: [phone]; Fax: [phone]

## 2014-09-21 ENCOUNTER — Other Ambulatory Visit: Payer: Self-pay | Admitting: Cardiovascular Disease

## 2014-09-30 ENCOUNTER — Encounter: Payer: Self-pay | Admitting: Internal Medicine

## 2014-09-30 ENCOUNTER — Ambulatory Visit (INDEPENDENT_AMBULATORY_CARE_PROVIDER_SITE_OTHER): Payer: Medicare Other | Admitting: Internal Medicine

## 2014-09-30 ENCOUNTER — Ambulatory Visit: Payer: Medicare Other | Admitting: Internal Medicine

## 2014-09-30 VITALS — BP 122/80 | HR 68 | Temp 97.4°F | Resp 12 | Wt 244.0 lb

## 2014-09-30 DIAGNOSIS — E1159 Type 2 diabetes mellitus with other circulatory complications: Secondary | ICD-10-CM | POA: Diagnosis not present

## 2014-09-30 DIAGNOSIS — E1165 Type 2 diabetes mellitus with hyperglycemia: Secondary | ICD-10-CM

## 2014-09-30 DIAGNOSIS — E1151 Type 2 diabetes mellitus with diabetic peripheral angiopathy without gangrene: Secondary | ICD-10-CM

## 2014-09-30 DIAGNOSIS — IMO0002 Reserved for concepts with insufficient information to code with codable children: Secondary | ICD-10-CM

## 2014-09-30 LAB — HEMOGLOBIN A1C: HEMOGLOBIN A1C: 7.4 % — AB (ref 4.6–6.5)

## 2014-09-30 MED ORDER — INSULIN DETEMIR 100 UNIT/ML FLEXPEN: 23.0000 [IU] | PEN_INJECTOR | Freq: Every day | SUBCUTANEOUS | Status: DC

## 2014-09-30 NOTE — Patient Instructions (Addendum)
Patient Instructions  Please continue: - Metformin-glipizide 500-2.5 mg x2 tablets 2x a day - Tradjenta 5 mg in am.  Please increase Levemir to 23 units at bedtime.  Please restart going to the gym regularly.  Please schedule an appt with Antonieta Iba with nutrition.  Please return in 3 months with your sugar log.  Please stop at the lab.

## 2014-09-30 NOTE — Progress Notes (Signed)
Patient ID: [name], male   DOB: 09/18/1945, 69 y.o.   MRN: [id number]  HPI: [name] is a 69 y.o.-year-old male, returning for f/u for DM2, dx 1995, non-insulin-dependent, uncontrolled, with complications (CKD stage 3, CAD - AMI, s/p stent LAD 2001). Last visit 3 mo ago.  Last hemoglobin A1c was: Lab Results  Component Value Date   HGBA1C 7.2* 07/01/2014   HGBA1C 9.2* 03/28/2014   Pt was on a regimen of: - On Glipizide - Metformin 2.5 - 500 mg po bid - Jardiance 25 daily >> will not be covered by insurance in 2016. He was on Levemir (40 >> 70 units a day) in the past, 1 year ago.  He is now on: Metformin-glipizide 500-2.5 mg x2 tablets bid Levemir 20 units at bedtime. Tradjenta 5 mg in am (added 05/2014) We stopped Jardiance.  Pt checks his sugars 1x a day and they are higher - stopped oing to the gym. He had better sugars in the last week as he restarted to go to the gym: - am: 165-190 >> 118-192, 207 >> 120-156, 187 >> 124-183 - 2h after b'fast: n/c >> 131 - before lunch: 204 >> 136-165, 213 >> 91-136, 171 >> 161-191 - 2h after lunch: 214 >> 132 >> n/c - before dinner: n/c >> 116-163 >> 87-148 >> 57 (skipped a meal), 103-173 - 2h after dinner: n/c >> 175, 216 >> 108, 183-216 >> 153-230, 279 - bedtime: 225 >> 135, 146-250 >> 94, 111-162, 180 >> 133-224 - nighttime: n/c No lows. Lowest sugar was 118; ? if hypoglycemia awareness.  Highest sugar was 250  Pt's meals are: - Breakfast: eggs, toast, ham/sausage cereal - Lunch: sandwich, vegetables, meat - Dinner: seafood, meat, vegetables - Snacks: nabs  He exercises 2x a week.  - + CKD stage 3, last BUN/creatinine:  Lab Results  Component Value Date   BUN 14 03/28/2014   CREATININE 1.4* 03/28/2014  On ramipril - last set of lipids: Lab Results  Component Value Date   CHOL 131 03/28/2014   HDL 47 03/28/2014   LDLCALC 68 03/28/2014   TRIG 82 03/28/2014  On Lipitor 20. - last eye exam was in 04/12/2014 >>  No DR. - no numbness and tingling in his feet. He had a foot exam today exam >> normal.   ROS: Constitutional: no weight gain/loss, no fatigue, no subjective hyperthermia/hypothermia Eyes: no blurry vision, no xerophthalmia ENT: no sore throat, no nodules palpated in throat, no dysphagia/odynophagia, no hoarseness, + decreased hearing Cardiovascular: no CP/SOB/palpitations/leg swelling Respiratory: no cough/SOB Gastrointestinal: no N/V/D/no C Musculoskeletal: no muscle/joint aches Skin: no rashes Neurological: no tremors/numbness/tingling/dizziness  I reviewed pt's medications, allergies, PMH, social hx, family hx, and changes were documented in the history of present illness. Otherwise, unchanged from my initial visit note:  Past Medical History  Diagnosis Date  . Coronary artery disease     post bare-metal stent to LAD in 2001  . Diabetes mellitus   . Hypertension   . Myocardial infarct 2001  . Atrial fibrillation October 2013    Xarelto started; duration unknown  . Obesity   . Chronic kidney disease     Stage III    Past Surgical History  Procedure Laterality Date  . Coronary angioplasty with stent placement  2001    bare-metal stent to the LAD  . Cardiac catheterization  2006    stable with primarily nonobstructive disease  . Skin graft  1981  . Hernia repair  2006  .  Cardioversion  03/27/2012    Procedure: CARDIOVERSION;  Surgeon: Carlena Bjornstad, MD;  Location: [facility] ENDOSCOPY;  Service: Cardiovascular;  Laterality: N/A;   Social History Main Topics  . Smoking status: Former Smoker -- 0.50 packs/day for 40 years    Types: Cigarettes, Pipe, Cigars    Quit date: 05/11/1999  . Smokeless tobacco: Never Used  . Alcohol Use: No  . Drug Use: No   Social History Narrative   Married    Retired: [employer]   3 grown children   Current Outpatient Prescriptions on File Prior to Visit  Medication Sig Dispense Refill  . atorvastatin (LIPITOR) 20 MG tablet Take 20 mg by mouth 2  (two) times daily.     Marland Kitchen glipiZIDE-metformin (METAGLIP) 2.5-500 MG per tablet Take 2 tablets by mouth 2 (two) times daily before a meal. 360 tablet 1  . Insulin Detemir (LEVEMIR FLEXTOUCH) 100 UNIT/ML Pen Inject 20 Units into the skin at bedtime. 15 mL 3  . Insulin Pen Needle 32G X 4 MM MISC Use to inject insulin 1 time daily as instructed. 90 each 3  . metoprolol tartrate (LOPRESSOR) 25 MG tablet TAKE 1 TABLET BY MOUTH TWICE A DAY 180 tablet 3  . nitroGLYCERIN (NITROSTAT) 0.4 MG SL tablet Place 0.4 mg under the tongue every 5 (five) minutes as needed.    Marland Kitchen omeprazole (PRILOSEC) 20 MG capsule Take 20 mg by mouth daily.    . ramipril (ALTACE) 10 MG capsule Take 10 mg by mouth daily.    . TRADJENTA 5 MG TABS tablet TAKE 1 TABLET (5 MG TOTAL) BY MOUTH DAILY. 30 tablet 1  . XARELTO 20 MG TABS tablet TAKE 1 TABLET BY MOUTH EVERY DAY 90 tablet 2   No current facility-administered medications on file prior to visit.   Allergies  Allergen Reactions  . Omeprazole Diarrhea   Family History  Problem Relation Age of Onset  . Heart disease Father   . ALS Mother   . Diabetes Sister   . Hypertension Sister   . Heart failure Brother   . Heart attack Father   . Stroke Neg Hx    PE: BP 122/80 mmHg  Pulse 68  Temp(Src) 97.4 F (36.3 C) (Oral)  Resp 12  Wt 244 lb (110.678 kg)  SpO2 97% Wt Readings from Last 3 Encounters:  09/30/14 244 lb (110.678 kg)  08/15/14 236 lb (107.049 kg)  07/01/14 236 lb (107.049 kg)   Constitutional: overweight, in NAD Eyes: PERRLA, EOMI, no exophthalmos ENT: moist mucous membranes, no thyromegaly, no cervical lymphadenopathy Cardiovascular: RRR, No MRG Respiratory: CTA B Gastrointestinal: abdomen soft, NT, ND, BS+ Musculoskeletal: no deformities, strength intact in all 4 Skin: moist, warm, no rashes Neurological: no tremor with outstretched hands, DTR normal in all 4  ASSESSMENT: 1. DM2, insulin-dependent, uncontrolled, without complications  PLAN:  1.  Patient with long-standing, uncontrolled diabetes, on oral antidiabetic regimen + basal insulin, with higher sugars after stopping going to the gym. He and his wife tell me they go out a lot to eat. We discussed at length about diet and they also agree for a referral to nutrition. Will increase the Levemir a little for now. - I suggested to:  Patient Instructions  Please continue: - Metformin-glipizide 500-2.5 mg x2 tablets 2x a day - Tradjenta 5 mg in am.  Please increase Levemir to 23 units at bedtime.  Please restart going to the gym regularly.  Please schedule an appt with Antonieta Iba with nutrition.  Please return  in 3 months with your sugar log.  Please stop at the lab.  - We are limited in our treatment options by the insurance coverage and also by his kidney function. We can increase the glipizide to 10 mg twice a day, if we need to, in the future.  - continue checking sugars at different times of the day - check 2 times a day, rotating checks - advised for yearly eye exams, he is UTD - will check HbA1c today - Return to clinic in 3 months  Office Visit on 09/30/2014  Component Date Value Ref Range Status  . Hgb A1c MFr Bld 09/30/2014 7.4* 4.6 - 6.5 % Final   Glycemic Control Guidelines for People with Diabetes:Non Diabetic:  <6%Goal of Therapy: <7%Additional Action Suggested:  >8%     HbA1c a little higher. See plan above.

## 2014-10-03 ENCOUNTER — Encounter: Payer: Medicare Other | Attending: Internal Medicine | Admitting: Dietician

## 2014-10-03 ENCOUNTER — Other Ambulatory Visit: Payer: Self-pay | Admitting: Internal Medicine

## 2014-10-03 ENCOUNTER — Encounter: Payer: Self-pay | Admitting: Cardiovascular Disease

## 2014-10-03 ENCOUNTER — Encounter: Payer: Self-pay | Admitting: Dietician

## 2014-10-03 VITALS — Ht 74.0 in | Wt 240.0 lb

## 2014-10-03 DIAGNOSIS — E1159 Type 2 diabetes mellitus with other circulatory complications: Secondary | ICD-10-CM | POA: Insufficient documentation

## 2014-10-03 DIAGNOSIS — Z794 Long term (current) use of insulin: Secondary | ICD-10-CM | POA: Insufficient documentation

## 2014-10-03 DIAGNOSIS — Z713 Dietary counseling and surveillance: Secondary | ICD-10-CM | POA: Diagnosis not present

## 2014-10-03 DIAGNOSIS — E1165 Type 2 diabetes mellitus with hyperglycemia: Secondary | ICD-10-CM

## 2014-10-03 DIAGNOSIS — IMO0002 Reserved for concepts with insufficient information to code with codable children: Secondary | ICD-10-CM

## 2014-10-03 DIAGNOSIS — E1151 Type 2 diabetes mellitus with diabetic peripheral angiopathy without gangrene: Secondary | ICD-10-CM

## 2014-10-03 NOTE — Patient Instructions (Signed)
Plan:  Try to cook at home more often. Bake, broil, or grill rather than fry. Be mindful of portion sizes. Aim for 5 Carb Choices per meal (75 grams) +/- 1 either way  Aim for 0-1 Carbs per snack if hungry  Include protein in moderation with your meals and snacks Consider reading food labels for Total Carbohydrate and Fat Grams of foods Be as active as you can. Find something active every day to enjoy.

## 2014-10-03 NOTE — Progress Notes (Signed)
Medical Nutrition Therapy:  Appt start time: 6659 end time:  1730.   Assessment:  Primary concerns today: Patient is here with his wife.  He would like to learn more about nutrition and how to decrease his blood sugar.  HgbA1C 7.4% 09/30/14, 7.2% 07/01/14, decreased from 9.2% 03/28/14.  He has had type 2 Diabetes for the past 15 years.  (Uncontrolled with peripheral circulatory disease, Stage 3 CKD, and MI with stent in 2001.  He has lost from 264 lbs in January to 240 lbs today by going to the gym.  He gained weight in the 2 weeks that he did not go. Wife has also lost 6 lbs.    Patient lives with his wife.  He is retired from [employer].His wife does the shopping and cooking and they eat out a lot.  They will be watching  Their 58 and 93 year old grandchildren this summer and will eat out less.  Wife also has type 2 diabetes.  They are both patient's of Dr. Renne Crigler.    Preferred Learning Style:   No preference indicated   Learning Readiness:   Ready  MEDICATIONS: see list to include Metaglip, levemir, tradjenta   DIETARY INTAKE: Takes a while to eat with partial.  They have a large garden and cook and can large amounts. 24-hr recall:  Banana before breakfast to take his meds. B (9:30-10 AM): Eats out 3-4 x per week:  2 eggs., sausage or bacon or country ham or tenderloin with stewed apples with [employer] toast or biscuit with a lot of coffee with cream for the first cup and black for the others.  OR home:  Grits and coffee Snk ( AM): cereal bar if he eats at home L (1:30-2) PM):[employer]:  Apple pecan salad with grilled chicken and pomigrate dressing, or [employer]:  veges and a meat or Pizza Snk ( PM): occasional NABS D (6:30 PM): Home:  spring salad with craisins, nuts, salad strips, and balsamic dressing, green beans, grilled chicken, gold potatoes OR Out to eat:  Fried fish or shrimp, or broiled salmon, sweet potato and slaw or salad Snk ( PM): occasional Beverages: water or coffee  Usual physical  activity: Goes to gym in [facility] after he eats out for breakfast  2-3 times per week.  Walks 1 mile around neighborhood occasionally. Or gardening.  Estimated energy needs: 2000 calories 225 g carbohydrates 125 g protein 67 g fat  Progress Towards Goal(s):  In progress.   Nutritional Diagnosis:  NB-1.1 Food and nutrition-related knowledge deficit As related to balance of carbohydrate, protein, and fat.  As evidenced by diet hx.    Intervention:  Nutrition counseling and diabetes education initiated. Discussed Carb Counting by food group as method of portion control, reading food labels, and benefits of increased activity. Also discussed basic physiology of Diabetes, target BG ranges pre and post meals, and A1c.  Discussed healthier options when eating out and benefits of preparing more meals at home.  Plan:  Try to cook at home more often. Bake, broil, or grill rather than fry. Be mindful of portion sizes. Aim for 5 Carb Choices per meal (75 grams) +/- 1 either way  Aim for 0-1 Carbs per snack if hungry  Include protein in moderation with your meals and snacks Consider reading food labels for Total Carbohydrate and Fat Grams of foods Be as active as you can. Find something active every day to enjoy.  Teaching Method Utilized:  Visual Auditory Hands on  Handouts given during visit include:  My plate  Meal plan card  Label reading  HgbA1C sheet  Barriers to learning/adherence to lifestyle change: none  Demonstrated degree of understanding via:  Teach Back   Monitoring/Evaluation:  Dietary intake, exercise, label reading, and body weight prn.

## 2014-10-07 ENCOUNTER — Other Ambulatory Visit: Payer: Self-pay | Admitting: Internal Medicine

## 2014-10-09 ENCOUNTER — Other Ambulatory Visit: Payer: Self-pay | Admitting: *Deleted

## 2014-10-09 MED ORDER — LINAGLIPTIN 5 MG PO TABS: ORAL_TABLET | ORAL | Status: DC

## 2014-11-02 ENCOUNTER — Other Ambulatory Visit: Payer: Self-pay | Admitting: Internal Medicine

## 2014-11-14 ENCOUNTER — Other Ambulatory Visit: Payer: Self-pay | Admitting: Internal Medicine

## 2014-11-14 ENCOUNTER — Telehealth: Payer: Self-pay | Admitting: *Deleted

## 2014-11-14 DIAGNOSIS — E1165 Type 2 diabetes mellitus with hyperglycemia: Secondary | ICD-10-CM

## 2014-11-14 DIAGNOSIS — IMO0002 Reserved for concepts with insufficient information to code with codable children: Secondary | ICD-10-CM

## 2014-11-14 DIAGNOSIS — E1151 Type 2 diabetes mellitus with diabetic peripheral angiopathy without gangrene: Secondary | ICD-10-CM

## 2014-11-14 MED ORDER — ALOGLIPTIN BENZOATE 25 MG PO TABS: ORAL_TABLET | ORAL | Status: DC

## 2014-11-14 NOTE — Telephone Encounter (Signed)
Pt called and would like another option than Tradjenta. Lady Gary is too expensive. Please review and advise.

## 2014-12-31 ENCOUNTER — Encounter: Payer: Self-pay | Admitting: Internal Medicine

## 2014-12-31 ENCOUNTER — Ambulatory Visit (INDEPENDENT_AMBULATORY_CARE_PROVIDER_SITE_OTHER): Payer: Medicare Other | Admitting: Internal Medicine

## 2014-12-31 ENCOUNTER — Other Ambulatory Visit (INDEPENDENT_AMBULATORY_CARE_PROVIDER_SITE_OTHER): Payer: Medicare Other | Admitting: *Deleted

## 2014-12-31 VITALS — BP 118/62 | HR 80 | Temp 97.5°F | Resp 12 | Wt 240.0 lb

## 2014-12-31 DIAGNOSIS — E1165 Type 2 diabetes mellitus with hyperglycemia: Secondary | ICD-10-CM

## 2014-12-31 DIAGNOSIS — E1159 Type 2 diabetes mellitus with other circulatory complications: Secondary | ICD-10-CM | POA: Diagnosis not present

## 2014-12-31 DIAGNOSIS — E1151 Type 2 diabetes mellitus with diabetic peripheral angiopathy without gangrene: Secondary | ICD-10-CM

## 2014-12-31 DIAGNOSIS — IMO0002 Reserved for concepts with insufficient information to code with codable children: Secondary | ICD-10-CM

## 2014-12-31 LAB — POCT GLYCOSYLATED HEMOGLOBIN (HGB A1C): Hemoglobin A1C: 7.5

## 2014-12-31 MED ORDER — INSULIN DETEMIR 100 UNIT/ML FLEXPEN: 27.0000 [IU] | PEN_INJECTOR | Freq: Every day | SUBCUTANEOUS | Status: DC

## 2014-12-31 NOTE — Patient Instructions (Signed)
Please continue: - Metformin-glipizide 500-2.5 mg x2 tablets 2x a day  Please increase: - Levemir to 27 units at bedtime.  Stop Tradjenta when you run out.  Please return in 3 months with your sugar log.

## 2014-12-31 NOTE — Progress Notes (Signed)
Patient ID: [name], male   DOB: 23-Dec-1945, 69 y.o.   MRN: [id number]  HPI: [name] is a 69 y.o.-year-old male, returning for f/u for DM2, dx 1995, non-insulin-dependent, uncontrolled, with complications (CKD stage 3, CAD - AMI, s/p stent LAD 2001). Last visit 3 mo ago.  They just started to go back to the gym.  Last hemoglobin A1c was: Lab Results  Component Value Date   HGBA1C 7.4* 09/30/2014   HGBA1C 7.2* 07/01/2014   HGBA1C 9.2* 03/28/2014   Pt was on a regimen of: - On Glipizide - Metformin 2.5 - 500 mg po bid - Jardiance 25 daily >> will not be covered by insurance in 2016. He was on Levemir (40 >> 70 units a day) in the past, 1 year ago.  He is now on: Metformin-glipizide 500-2.5 mg x2 tablets bid Levemir 20 >> 23 units at bedtime.  Tradjenta 5 mg in am (added 05/2014) - too expensive We stopped Jardiance. Alogliptin is 300$.  Pt checks his sugars 1-2x a day and they are: - am: 165-190 >> 118-192, 207 >> 120-156, 187 >> 124-183 >> 139-169 - 2h after b'fast: n/c >> 131 >> n/c - before lunch: 204 >> 136-165, 213 >> 91-136, 171 >> 161-191 >> n/c - 2h after lunch: 214 >> 132 >> n/c >> 179 - before dinner: n/c >> 116-163 >> 87-148 >> 57 (skipped a meal), 103-173 >> 173-230 - 2h after dinner: n/c >> 175, 216 >> 108, 183-216 >> 153-230, 279 >> 155, 187 - bedtime: 225 >> 135, 146-250 >> 94, 111-162, 180 >> 133-224 >> 123-199, 224 - nighttime: n/c No lows. Lowest sugar was 118; ? if hypoglycemia awareness.  Highest sugar was 250  Pt's meals are: - Breakfast: eggs, toast, ham/sausage cereal - Lunch: sandwich, vegetables, meat - Dinner: seafood, meat, vegetables - Snacks: nabs  He exercises 2x a week.  - + CKD stage 3, last BUN/creatinine:  Lab Results  Component Value Date   BUN 14 03/28/2014   CREATININE 1.4* 03/28/2014  On ramipril - last set of lipids: Lab Results  Component Value Date   CHOL 131 03/28/2014   HDL 47 03/28/2014   LDLCALC 68  03/28/2014   TRIG 82 03/28/2014  On Lipitor 20. - last eye exam was in 04/12/2014 >> No DR. - no numbness and tingling in his feet. He had a foot exam today exam >> normal.   ROS: Constitutional: no weight gain/loss, no fatigue, no subjective hyperthermia/hypothermia Eyes: no blurry vision, no xerophthalmia ENT: no sore throat, no nodules palpated in throat, no dysphagia/odynophagia, no hoarseness, + decreased hearing Cardiovascular: no CP/SOB/palpitations/leg swelling Respiratory: no cough/SOB Gastrointestinal: no N/V/D/no C Musculoskeletal: no muscle/joint aches Skin: no rashes Neurological: no tremors/numbness/tingling/dizziness  I reviewed pt's medications, allergies, PMH, social hx, family hx, and changes were documented in the history of present illness. Otherwise, unchanged from my initial visit note:  Past Medical History  Diagnosis Date  . Coronary artery disease     post bare-metal stent to LAD in 2001  . Diabetes mellitus   . Hypertension   . Myocardial infarct 2001  . Atrial fibrillation October 2013    Xarelto started; duration unknown  . Obesity   . Chronic kidney disease     Stage III    Past Surgical History  Procedure Laterality Date  . Coronary angioplasty with stent placement  2001    bare-metal stent to the LAD  . Cardiac catheterization  2006    stable  with primarily nonobstructive disease  . Skin graft  1981  . Hernia repair  2006  . Cardioversion  03/27/2012    Procedure: CARDIOVERSION;  Surgeon: Carlena Bjornstad, MD;  Location: [facility] ENDOSCOPY;  Service: Cardiovascular;  Laterality: N/A;   Social History Main Topics  . Smoking status: Former Smoker -- 0.50 packs/day for 40 years    Types: Cigarettes, Pipe, Cigars    Quit date: 05/11/1999  . Smokeless tobacco: Never Used  . Alcohol Use: No  . Drug Use: No   Social History Narrative   Married    Retired: [employer]   3 grown children   Current Outpatient Prescriptions on File Prior to Visit   Medication Sig Dispense Refill  . Alogliptin Benzoate 25 MG TABS Take by mouth 1 tablet daily in am. 30 tablet 2  . atorvastatin (LIPITOR) 20 MG tablet Take 20 mg by mouth 2 (two) times daily.     . BD PEN NEEDLE NANO U/F 32G X 4 MM MISC USE ONCE A DAY 100 each 2  . glipiZIDE-metformin (METAGLIP) 2.5-500 MG per tablet TAKE 2 TABLETS BY MOUTH 2 TIMES DAILY BEFORE A MEAL. 360 tablet 1  . Insulin Detemir (LEVEMIR FLEXTOUCH) 100 UNIT/ML Pen Inject 23 Units into the skin at bedtime. 15 mL 3  . metoprolol tartrate (LOPRESSOR) 25 MG tablet TAKE 1 TABLET BY MOUTH TWICE A DAY 180 tablet 3  . nitroGLYCERIN (NITROSTAT) 0.4 MG SL tablet Place 0.4 mg under the tongue every 5 (five) minutes as needed.    Marland Kitchen omeprazole (PRILOSEC) 20 MG capsule Take 20 mg by mouth daily.    . ramipril (ALTACE) 10 MG capsule Take 10 mg by mouth daily.    Alveda Reasons 20 MG TABS tablet TAKE 1 TABLET BY MOUTH EVERY DAY 90 tablet 2   No current facility-administered medications on file prior to visit.   Allergies  Allergen Reactions  . Omeprazole Diarrhea   Family History  Problem Relation Age of Onset  . Heart disease Father   . ALS Mother   . Diabetes Sister   . Hypertension Sister   . Heart failure Brother   . Heart attack Father   . Stroke Neg Hx    PE: BP 118/62 mmHg  Pulse 80  Temp(Src) 97.5 F (36.4 C) (Oral)  Resp 12  Wt 240 lb (108.863 kg)  SpO2 97% Wt Readings from Last 3 Encounters:  12/31/14 240 lb (108.863 kg)  10/03/14 240 lb (108.863 kg)  09/30/14 244 lb (110.678 kg)   Constitutional: overweight, in NAD Eyes: PERRLA, EOMI, no exophthalmos ENT: moist mucous membranes, no thyromegaly, no cervical lymphadenopathy Cardiovascular: RRR, No MRG Respiratory: CTA B Gastrointestinal: abdomen soft, NT, ND, BS+ Musculoskeletal: no deformities, strength intact in all 4 Skin: moist, warm, no rashes Neurological: no tremor with outstretched hands, DTR normal in all 4  ASSESSMENT: 1. DM2,  insulin-dependent, uncontrolled, without complications  PLAN:  1. Patient with long-standing, uncontrolled diabetes, on oral antidiabetic regimen + basal insulin, with higher sugars after stopping going to the gym. He and his wife tell me they go out a lot to eat. We discussed at length about diet and they also agree for a referral to nutrition. Will increase the Levemir a little for now. S - I suggested to:  Patient Instructions  Please continue: - Metformin-glipizide 500-2.5 mg x2 tablets 2x a day  Please increase: - Levemir to 27 units at bedtime.  Stop Tradjenta when you run out.  Please return in 3  months with your sugar log.   - We are limited in our treatment options by the insurance coverage and also by his kidney function. We can increase the glipizide to 10 mg twice a day, if we need to, in the future.  - continue checking sugars at different times of the day - check 2 times a day, rotating checks - advised for yearly eye exams, he is UTD - will check HbA1c today >> 7.5% - Return to clinic in 3 months

## 2015-01-02 ENCOUNTER — Other Ambulatory Visit: Payer: Self-pay | Admitting: Internal Medicine

## 2015-01-18 ENCOUNTER — Other Ambulatory Visit: Payer: Self-pay | Admitting: Internal Medicine

## 2015-02-11 ENCOUNTER — Other Ambulatory Visit (HOSPITAL_COMMUNITY): Payer: Medicare Other

## 2015-02-11 ENCOUNTER — Ambulatory Visit: Payer: Medicare Other | Admitting: Family

## 2015-02-12 ENCOUNTER — Encounter: Payer: Self-pay | Admitting: Family

## 2015-02-14 ENCOUNTER — Ambulatory Visit (INDEPENDENT_AMBULATORY_CARE_PROVIDER_SITE_OTHER): Payer: Medicare Other | Admitting: Family

## 2015-02-14 ENCOUNTER — Encounter: Payer: Self-pay | Admitting: Family

## 2015-02-14 ENCOUNTER — Other Ambulatory Visit: Payer: Self-pay | Admitting: Family

## 2015-02-14 ENCOUNTER — Ambulatory Visit (HOSPITAL_COMMUNITY)
Admission: RE | Admit: 2015-02-14 | Discharge: 2015-02-14 | Disposition: A | Discharge status: Home / Self Care | Payer: Medicare Other | Source: Ambulatory Visit | Attending: Family | Admitting: Family

## 2015-02-14 VITALS — BP 114/76 | HR 76 | Temp 96.9°F | Resp 16 | Ht 75.0 in | Wt 240.0 lb

## 2015-02-14 DIAGNOSIS — I129 Hypertensive chronic kidney disease with stage 1 through stage 4 chronic kidney disease, or unspecified chronic kidney disease: Secondary | ICD-10-CM | POA: Diagnosis not present

## 2015-02-14 DIAGNOSIS — I714 Abdominal aortic aneurysm, without rupture, unspecified: Secondary | ICD-10-CM

## 2015-02-14 DIAGNOSIS — E119 Type 2 diabetes mellitus without complications: Secondary | ICD-10-CM | POA: Diagnosis not present

## 2015-02-14 DIAGNOSIS — I723 Aneurysm of iliac artery: Secondary | ICD-10-CM | POA: Insufficient documentation

## 2015-02-14 DIAGNOSIS — Z48812 Encounter for surgical aftercare following surgery on the circulatory system: Secondary | ICD-10-CM

## 2015-02-14 DIAGNOSIS — N183 Chronic kidney disease, stage 3 (moderate): Secondary | ICD-10-CM | POA: Insufficient documentation

## 2015-02-14 NOTE — Patient Instructions (Signed)
Abdominal Aortic Aneurysm An aneurysm is a weakened or damaged part of an artery wall that bulges from the normal force of blood pumping through the body. An abdominal aortic aneurysm is an aneurysm that occurs in the lower part of the aorta, the main artery of the body.  The major concern with an abdominal aortic aneurysm is that it can enlarge and burst (rupture) or blood can flow between the layers of the wall of the aorta through a tear (aorticdissection). Both of these conditions can cause bleeding inside the body and can be life threatening unless diagnosed and treated promptly. CAUSES  The exact cause of an abdominal aortic aneurysm is unknown. Some contributing factors are:   A hardening of the arteries caused by the buildup of fat and other substances in the lining of a blood vessel (arteriosclerosis).  Inflammation of the walls of an artery (arteritis).   Connective tissue diseases, such as Marfan syndrome.   Abdominal trauma.   An infection, such as syphilis or staphylococcus, in the wall of the aorta (infectious aortitis) caused by bacteria. RISK FACTORS  Risk factors that contribute to an abdominal aortic aneurysm may include:  Age older than 60 years.   High blood pressure (hypertension).  Male gender.  Ethnicity (white race).  Obesity.  Family history of aneurysm (first degree relatives only).  Tobacco use. PREVENTION  The following healthy lifestyle habits may help decrease your risk of abdominal aortic aneurysm:  Quitting smoking. Smoking can raise your blood pressure and cause arteriosclerosis.  Limiting or avoiding alcohol.  Keeping your blood pressure, blood sugar level, and cholesterol levels within normal limits.  Decreasing your salt intake. In somepeople, too much salt can raise blood pressure and increase your risk of abdominal aortic aneurysm.  Eating a diet low in saturated fats and cholesterol.  Increasing your fiber intake by including  whole grains, vegetables, and fruits in your diet. Eating these foods may help lower blood pressure.  Maintaining a healthy weight.  Staying physically active and exercising regularly. SYMPTOMS  The symptoms of abdominal aortic aneurysm may vary depending on the size and rate of growth of the aneurysm.Most grow slowly and do not have any symptoms. When symptoms do occur, they may include:  Pain (abdomen, side, lower back, or groin). The pain may vary in intensity. A sudden onset of severe pain may indicate that the aneurysm has ruptured.  Feeling full after eating only small amounts of food.  Nausea or vomiting or both.  Feeling a pulsating lump in the abdomen.  Feeling faint or passing out. DIAGNOSIS  Since most unruptured abdominal aortic aneurysms have no symptoms, they are often discovered during diagnostic exams for other conditions. An aneurysm may be found during the following procedures:  Ultrasonography (A one-time screening for abdominal aortic aneurysm by ultrasonography is also recommended for all men aged 65-75 years who have ever smoked).  X-ray exams.  A computed tomography (CT).  Magnetic resonance imaging (MRI).  Angiography or arteriography. TREATMENT  Treatment of an abdominal aortic aneurysm depends on the size of your aneurysm, your age, and risk factors for rupture. Medication to control blood pressure and pain may be used to manage aneurysms smaller than 6 cm. Regular monitoring for enlargement may be recommended by your caregiver if:  The aneurysm is 3-4 cm in size (an annual ultrasonography may be recommended).  The aneurysm is 4-4.5 cm in size (an ultrasonography every 6 months may be recommended).  The aneurysm is larger than 4.5 cm in   size (your caregiver may ask that you be examined by a vascular surgeon). If your aneurysm is larger than 6 cm, surgical repair may be recommended. There are two main methods for repair of an aneurysm:   Endovascular  repair (a minimally invasive surgery). This is done most often.  Open repair. This method is used if an endovascular repair is not possible.   This information is not intended to replace advice given to you by your health care provider. Make sure you discuss any questions you have with your health care provider.   Document Released: 02/03/2005 Document Revised: 08/21/2012 Document Reviewed: 05/26/2012 Elsevier Interactive Patient Education 2016 Elsevier Inc[employer]  

## 2015-02-14 NOTE — Progress Notes (Signed)
[facility]   Established Abdominal Aortic Aneurysm  History of Present Illness  [name] is a 69 y.o. ([birth year]) male patient of Dr. Donnetta Hutching who underwent a CT scan for workup of hematuria. He had an incidental finding of a 3.6 cm infrarenal abdominal aortic aneurysm. 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 He had no known knowledge of this aneurysm. He has no symptoms of the aneurysm. He does have a history of prior myocardial infarction 2001 and had a bare-metal stent placed that time.  The patient does not have acute back or abdominal pain.   He has degenerative disc disease in his back, but this has improved with physical therapy and exercise.  He takes Xarelto for atrial fib., Dr. Serita Sheller is his cardiologist.   Pt Diabetic: Yes, patient states uncontrolled, seeing an endocrinologist for about a year, Dr. Monna Fam (sp?) Pt smoker: former smoker, quit 2001  The patient denies claudication in legs with walking. The patient denies history of stroke or TIA symptoms.   Past Medical History  Diagnosis Date  . Coronary artery disease     post bare-metal stent to LAD in 2001  . Diabetes mellitus   . Hypertension   . Myocardial infarct 2001  . Atrial fibrillation October 2013    Xarelto started; duration unknown  . Obesity   . Chronic kidney disease     Stage III    Past Surgical History  Procedure Laterality Date  . Coronary angioplasty with stent placement  2001    bare-metal stent to the LAD  . Cardiac catheterization  2006    stable with primarily nonobstructive disease  . Skin graft  1981  . Hernia repair  2006  . Cardioversion  03/27/2012    Procedure: CARDIOVERSION;  Surgeon: Carlena Bjornstad, MD;  Location: [facility] ENDOSCOPY;  Service: Cardiovascular;  Laterality: N/A;   Social History Social History   Social History  . Marital Status: Married    Spouse Name: N/A  . Number of Children: N/A  . Years of  Education: N/A   Occupational History  . Not on file.   Social History Main Topics  . Smoking status: Former Smoker -- 0.50 packs/day for 40 years    Types: Cigarettes, Pipe, Cigars    Quit date: 05/11/1999  . Smokeless tobacco: Never Used  . Alcohol Use: No  . Drug Use: No  . Sexual Activity: Yes   Other Topics Concern  . Not on file   Social History Narrative   Married    Retired: [employer]   3 grown children      Family History Family History  Problem Relation Age of Onset  . Heart disease Father   . ALS Mother   . Diabetes Sister   . Hypertension Sister   . Heart failure Brother   . Heart attack Father   . Stroke Neg Hx     Current Outpatient Prescriptions on File Prior to Visit  Medication Sig Dispense Refill  . atorvastatin (LIPITOR) 20 MG tablet Take 20 mg by mouth 2 (two) times daily.     . BD PEN NEEDLE NANO U/F 32G X 4 MM MISC USE ONCE A DAY 100 each 2  . glipiZIDE-metformin (METAGLIP) 2.5-500 MG per tablet TAKE 2 TABLETS BY MOUTH 2 TIMES DAILY BEFORE A MEAL. 360 tablet 1  . Insulin Detemir (LEVEMIR FLEXTOUCH) 100 UNIT/ML Pen Inject 27 Units into the skin at bedtime. 15 mL  3  . metoprolol tartrate (LOPRESSOR) 25 MG tablet TAKE 1 TABLET BY MOUTH TWICE A DAY 180 tablet 3  . nitroGLYCERIN (NITROSTAT) 0.4 MG SL tablet Place 0.4 mg under the tongue every 5 (five) minutes as needed.    Marland Kitchen omeprazole (PRILOSEC) 20 MG capsule Take 20 mg by mouth daily.    . ramipril (ALTACE) 10 MG capsule Take 10 mg by mouth daily.    Alveda Reasons 20 MG TABS tablet TAKE 1 TABLET BY MOUTH EVERY DAY 90 tablet 2   No current facility-administered medications on file prior to visit.   Allergies  Allergen Reactions  . Omeprazole Diarrhea    ROS: See HPI for pertinent positives and negatives.  Physical Examination  Filed Vitals:   02/14/15 0923  BP: 114/76  Pulse: 76  Temp: 96.9 F (36.1 C)  TempSrc: Oral  Resp: 16  Height: 6\' 3"  (1.905 m)  Weight: 240 lb (108.863 kg)  SpO2: 97%    Body mass index is 30 kg/(m^2).   General: A&O x 3, WD, obese male.  Pulmonary: Sym exp, good air movt, CTAB, no rales, rhonchi, & wheezing.  Cardiac: Irregular rate.   Carotid Bruits  Left  Right    Negative  Negative    Radial pulses are palpable bilaterally.  Aorta is not palpable, bilateral femoral pulses are not palpable.  Popliteal pulses are 2+ palpable bilaterally. DP and PT pedal pulses are 2+ palpable bilaterally.   Gastrointestinal: soft, NTND, -G/R, - HSM, - palpable masses, - CVAT B.  Musculoskeletal: M/S 5/5 throughout, Extremities without ischemic changes, no peripheral edema.  Neurologic: CN 2-12 intact except he is moderately hard of hearing, Pain and light touch intact in extremities, Motor exam as listed above           Non-Invasive Vascular Imaging  AAA Duplex (02/14/2015) ABDOMINAL AORTA DUPLEX EVALUATION    INDICATION: Abdominal Aortic Aneurysm    PREVIOUS INTERVENTION(S): NA    DUPLEX EXAM:     LOCATION DIAMETER AP (cm) DIAMETER TRANSVERSE (cm) VELOCITIES (cm/sec)  Aorta Proximal 3.16 3.28 76  Aorta Mid 2.27 2.08 58  Aorta Distal 4.05 4.20 58  Right Common Iliac Artery 1.62 2.02 88  Left Common Iliac Artery 1.51 1.73 111    Previous max aortic diameter:  3.54 x 3.77 Date: 02/05/2014  ADDITIONAL FINDINGS:     IMPRESSION: Technically difficult and limited evaluation due to presence of excessive bowel gas at the level of the distal abdominal aorta. Abdominal aortic aneurysm present measuring approximately 4.05cm AP x 4.20cm TRV, without intramural thrombus present. Right common iliac artery aneurysm present measuring approximately 1.62cm AP x 2.02cm TRV.    Compared to the previous exam:  Increase diameters since previous studies on 02/06/2015 and 01/30/2013.     Medical Decision Making  The patient is a 69 y.o. male who presents with asymptomatic AAA with 0.43 cm increase in size in a year with largest diameter today at  4.2 cm, and right CIA aneurysm diameter at 2.02 cm.   Based on this patient's exam and diagnostic studies, the patient will follow up in 6 months with the following studies: AAA duplex.  Consideration for repair of AAA would be made when the size is 5.5 cm, growth > 1 cm/yr, and symptomatic status.  I emphasized the importance of maximal medical management including strict control of blood pressure, blood glucose, and lipid levels, antiplatelet agents, obtaining regular exercise, and continued cessation of smoking.   The patient was given information about  AAA including signs, symptoms, treatment, and how to minimize the risk of enlargement and rupture of aneurysms.    The patient was advised to call 911 should the patient experience sudden onset abdominal or back pain.   Thank you for allowing [place] to participate in this patient's care.  Clemon Chambers, RN, MSN, FNP-C [facility] [place] Office: [phone]  Clinic Physician: Bridgett Larsson  02/14/2015, 9:12 AM

## 2015-02-21 ENCOUNTER — Other Ambulatory Visit: Payer: Self-pay | Admitting: Cardiovascular Disease

## 2015-03-13 ENCOUNTER — Other Ambulatory Visit: Payer: Self-pay | Admitting: Internal Medicine

## 2015-04-02 ENCOUNTER — Ambulatory Visit (INDEPENDENT_AMBULATORY_CARE_PROVIDER_SITE_OTHER): Payer: Medicare Other | Admitting: Internal Medicine

## 2015-04-02 ENCOUNTER — Encounter: Payer: Self-pay | Admitting: Internal Medicine

## 2015-04-02 ENCOUNTER — Telehealth: Payer: Self-pay | Admitting: *Deleted

## 2015-04-02 ENCOUNTER — Other Ambulatory Visit (INDEPENDENT_AMBULATORY_CARE_PROVIDER_SITE_OTHER): Payer: Medicare Other | Admitting: *Deleted

## 2015-04-02 VITALS — BP 112/68 | HR 78 | Temp 98.1°F | Resp 12 | Wt 244.6 lb

## 2015-04-02 DIAGNOSIS — IMO0002 Reserved for concepts with insufficient information to code with codable children: Secondary | ICD-10-CM

## 2015-04-02 DIAGNOSIS — E1151 Type 2 diabetes mellitus with diabetic peripheral angiopathy without gangrene: Secondary | ICD-10-CM | POA: Diagnosis not present

## 2015-04-02 DIAGNOSIS — E1165 Type 2 diabetes mellitus with hyperglycemia: Secondary | ICD-10-CM

## 2015-04-02 LAB — POCT GLYCOSYLATED HEMOGLOBIN (HGB A1C): Hemoglobin A1C: 8

## 2015-04-02 MED ORDER — GLIPIZIDE 5 MG PO TABS: 5.0000 mg | ORAL_TABLET | Freq: Every day | ORAL | Status: DC

## 2015-04-02 MED ORDER — GLIPIZIDE-METFORMIN HCL 2.5-500 MG PO TABS: ORAL_TABLET | ORAL | Status: DC

## 2015-04-02 MED ORDER — ALOGLIPTIN BENZOATE 12.5 MG PO TABS: ORAL_TABLET | ORAL | Status: DC

## 2015-04-02 MED ORDER — INSULIN DETEMIR 100 UNIT/ML FLEXPEN: 35.0000 [IU] | PEN_INJECTOR | Freq: Every day | SUBCUTANEOUS | Status: DC

## 2015-04-02 NOTE — Telephone Encounter (Signed)
Called and spoke with pt's wife. She voiced understanding.

## 2015-04-02 NOTE — Progress Notes (Addendum)
Patient ID: [name], male   DOB: 1945/11/04, 69 y.o.   MRN: [id number]  HPI: [name] is a 69 y.o.-year-old male, returning for f/u for DM2, dx 1995, non-insulin-dependent, uncontrolled, with complications (CKD stage 3, CAD - AMI, s/p stent LAD 2001). Last visit 3 mo ago.  Last hemoglobin A1c was: Lab Results  Component Value Date   HGBA1C 7.5 12/31/2014   HGBA1C 7.4* 09/30/2014   HGBA1C 7.2* 07/01/2014   Pt was on a regimen of: - On Glipizide - Metformin 2.5 - 500 mg po bid - Jardiance 25 daily >> not  covered by insurance in 2016. He was on Levemir (40 >> 70 units a day) in the past, 1 year ago.  He is now on: Metformin-glipizide 500-2.5 mg x2 tablets bid Levemir 20 >> 23 >> 28 >> 35 units at bedtime. We stopped Tradjenta 5 mg in am (added 05/2014) - too expensive We stopped Jardiance. Alogliptin is 300$.  Pt checks his sugars 1-2x a day and they are: - am: 165-190 >> 118-192, 207 >> 120-156, 187 >> 124-183 >> 139-169 >> 154-193 - 2h after b'fast: n/c >> 131 >> n/c - before lunch: 204 >> 136-165, 213 >> 91-136, 171 >> 161-191 >> n/c - 2h after lunch: 214 >> 132 >> n/c >> 179 - before dinner: n/c >> 116-163 >> 87-148 >> 57 (skipped a meal), 103-173 >> 173-230 >> 146-195 - 2h after dinner: n/c >> 175, 216 >> 108, 183-216 >> 153-230, 279 >> 155, 187 >> n/c - bedtime: 225 >> 135, 146-250 >> 94, 111-162, 180 >> 133-224 >> 123-199, 224 >> 170-284, 348 (ate late) - nighttime: n/c No lows. Lowest sugar was 118; ? if hypoglycemia awareness.  Highest sugar was 250  Pt's meals are: - Breakfast: eggs, toast, ham/sausage cereal - Lunch: sandwich, vegetables, meat - Dinner: seafood, meat, vegetables - Snacks: nabs  He exercises 2x a week.  - + CKD stage 3, last BUN/creatinine:  Lab Results  Component Value Date   BUN 14 03/28/2014   CREATININE 1.4* 03/28/2014  On ramipril. - last set of lipids: Lab Results  Component Value Date   CHOL 131 03/28/2014   HDL 47  03/28/2014   LDLCALC 68 03/28/2014   TRIG 82 03/28/2014  On Lipitor 20. - last eye exam was in 04/12/2014 >> No DR. - no numbness and tingling in his feet. He had a foot exam today exam >> normal.   ROS: Constitutional: no weight gain/loss, no fatigue, no subjective hyperthermia/hypothermia Eyes: no blurry vision, no xerophthalmia ENT: no sore throat, no nodules palpated in throat, no dysphagia/odynophagia, no hoarseness, + decreased hearing Cardiovascular: no CP/SOB/palpitations/leg swelling Respiratory: no cough/SOB Gastrointestinal: no N/V/D/no C Musculoskeletal: no muscle/joint aches Skin: no rashes Neurological: no tremors/numbness/tingling/dizziness  I reviewed pt's medications, allergies, PMH, social hx, family hx, and changes were documented in the history of present illness. Otherwise, unchanged from my initial visit note:  Past Medical History  Diagnosis Date  . Coronary artery disease     post bare-metal stent to LAD in 2001  . Diabetes mellitus   . Hypertension   . Myocardial infarct ([place]) 2001  . Atrial fibrillation [facility]) October 2013    Xarelto started; duration unknown  . Obesity   . Chronic kidney disease     Stage III   . AAA (abdominal aortic aneurysm) [facility])    Past Surgical History  Procedure Laterality Date  . Coronary angioplasty with stent placement  2001  bare-metal stent to the LAD  . Cardiac catheterization  2006    stable with primarily nonobstructive disease  . Skin graft  1981  . Hernia repair  2006  . Cardioversion  03/27/2012    Procedure: CARDIOVERSION;  Surgeon: Carlena Bjornstad, MD;  Location: [facility] ENDOSCOPY;  Service: Cardiovascular;  Laterality: N/A;   Social History Main Topics  . Smoking status: Former Smoker -- 0.50 packs/day for 40 years    Types: Cigarettes, Pipe, Cigars    Quit date: 05/11/1999  . Smokeless tobacco: Never Used  . Alcohol Use: No  . Drug Use: No   Social History Narrative   Married    Retired: [employer]   3  grown children   Current Outpatient Prescriptions on File Prior to Visit  Medication Sig Dispense Refill  . atorvastatin (LIPITOR) 20 MG tablet Take 20 mg by mouth 2 (two) times daily.     . BD PEN NEEDLE NANO U/F 32G X 4 MM MISC USE ONCE A DAY 100 each 2  . glipiZIDE-metformin (METAGLIP) 2.5-500 MG per tablet TAKE 2 TABLETS BY MOUTH 2 TIMES DAILY BEFORE A MEAL. 360 tablet 1  . Insulin Detemir (LEVEMIR FLEXTOUCH) 100 UNIT/ML Pen Inject 27 Units into the skin at bedtime. 15 mL 3  . Insulin Detemir (LEVEMIR FLEXTOUCH) 100 UNIT/ML Pen Inject 27 Units into the skin daily at 10 pm. 15 mL 2  . metoprolol tartrate (LOPRESSOR) 25 MG tablet TAKE 1 TABLET BY MOUTH TWICE A DAY 180 tablet 3  . nitroGLYCERIN (NITROSTAT) 0.4 MG SL tablet Place 0.4 mg under the tongue every 5 (five) minutes as needed.    Marland Kitchen omeprazole (PRILOSEC) 20 MG capsule Take 20 mg by mouth daily.    . ramipril (ALTACE) 10 MG capsule Take 10 mg by mouth daily.    Alveda Reasons 20 MG TABS tablet TAKE 1 TABLET BY MOUTH EVERY DAY 90 tablet 2   No current facility-administered medications on file prior to visit.   Allergies  Allergen Reactions  . Omeprazole Diarrhea   Family History  Problem Relation Age of Onset  . Heart disease Father   . ALS Mother   . Diabetes Sister   . Hypertension Sister   . Heart failure Brother   . Heart attack Father   . Stroke Neg Hx    PE: BP 112/68 mmHg  Pulse 78  Temp(Src) 98.1 F (36.7 C) (Oral)  Resp 12  Wt 244 lb 9.6 oz (110.95 kg)  SpO2 94% Body mass index is 30.57 kg/(m^2). Wt Readings from Last 3 Encounters:  04/02/15 244 lb 9.6 oz (110.95 kg)  02/14/15 240 lb (108.863 kg)  12/31/14 240 lb (108.863 kg)   Constitutional: overweight, in NAD Eyes: PERRLA, EOMI, no exophthalmos ENT: moist mucous membranes, no thyromegaly, no cervical lymphadenopathy Cardiovascular: RRR, No MRG Respiratory: CTA B Gastrointestinal: abdomen soft, NT, ND, BS+ Musculoskeletal: no deformities, strength intact  in all 4 Skin: moist, warm, no rashes Neurological: no tremor with outstretched hands, DTR normal in all 4  ASSESSMENT: 1. DM2, insulin-dependent, uncontrolled, without complications  PLAN:  1. Patient with long-standing, uncontrolled diabetes, on oral antidiabetic regimen + basal insulin, with higher sugars at nedtime. Will add 5 mg Glipizide with dinner >> total 10 mg with dinner and try to add Alogliptin (covered by [facility] now).  - I suggested to:  Patient Instructions  Please continue: - Metformin-glipizide 500-2.5 mg x2 tablets 2x a day - Levemir 35 units at bedtime.  Try to start [place] (  generic Aloglitin) 12.5 mg in am, before b'fast.  Start Glipizide 5 mg before a larger dinner.   Please return in 3 months with your sugar log.  - continue checking sugars at different times of the day - check 2 times a day, rotating checks - advised for yearly eye exams, he is UTD - will check HbA1c today >> 8.0% (higher) - had labs by PCP yesterday >> ? Lipids - will await records - Return to clinic in 3 months  Received labs from PCP, drawn on 04/01/2015: - ACR 56.3 - CBC with differential normal - Lipids: 116/56/42/63 - CMP: Glucose 193, BUN/creatinine 21/1.23, GFR 60, LFTs normal - PSA 0.5

## 2015-04-02 NOTE — Patient Instructions (Addendum)
Please continue: - Metformin-glipizide 500-2.5 mg x2 tablets 2x a day - Levemir 35 units at bedtime.  Try to start [place] (generic Aloglitin) 12.5 mg in am, before b'fast.  Start Glipizide 5 g before a larger dinner.   Please return in 3 months with your sugar log.

## 2015-04-02 NOTE — Telephone Encounter (Signed)
Pt called and his ins will not cover the Alogliptin, but they will cover Tradjenta. Please advise.

## 2015-04-02 NOTE — Telephone Encounter (Signed)
No, Tradjenta is too expensive for him and I just got a coverage report for generic Alogliptin mentioning that this is now Tier 1 for [facility]!!!  Please tell him to use Glipizide 5 mg tablet before every dinner, then.

## 2015-04-07 ENCOUNTER — Encounter: Payer: Self-pay | Admitting: Cardiovascular Disease

## 2015-04-22 LAB — HM DIABETES EYE EXAM

## 2015-05-09 ENCOUNTER — Encounter: Payer: Self-pay | Admitting: Internal Medicine

## 2015-06-26 ENCOUNTER — Other Ambulatory Visit: Payer: Self-pay | Admitting: Internal Medicine

## 2015-07-03 ENCOUNTER — Other Ambulatory Visit (INDEPENDENT_AMBULATORY_CARE_PROVIDER_SITE_OTHER): Payer: Medicare Other | Admitting: *Deleted

## 2015-07-03 ENCOUNTER — Ambulatory Visit (INDEPENDENT_AMBULATORY_CARE_PROVIDER_SITE_OTHER): Payer: Medicare Other | Admitting: Internal Medicine

## 2015-07-03 ENCOUNTER — Encounter: Payer: Self-pay | Admitting: Internal Medicine

## 2015-07-03 VITALS — BP 108/68 | HR 79 | Temp 97.9°F | Resp 12 | Wt 240.0 lb

## 2015-07-03 DIAGNOSIS — E1151 Type 2 diabetes mellitus with diabetic peripheral angiopathy without gangrene: Secondary | ICD-10-CM

## 2015-07-03 DIAGNOSIS — IMO0002 Reserved for concepts with insufficient information to code with codable children: Secondary | ICD-10-CM

## 2015-07-03 DIAGNOSIS — E1165 Type 2 diabetes mellitus with hyperglycemia: Secondary | ICD-10-CM

## 2015-07-03 LAB — POCT GLYCOSYLATED HEMOGLOBIN (HGB A1C): Hemoglobin A1C: 7.7

## 2015-07-03 NOTE — Progress Notes (Signed)
Patient ID: OCTAVION LEAP, male   DOB: [birth year], 70 y.o.   MRN: [id number]  HPI: [name] is a 70 y.o.-year-old male, returning for f/u for DM2, dx 1995, non-insulin-dependent, uncontrolled, with complications (CKD stage 3, CAD - AMI, s/p stent LAD 2001). Last visit 3 mo ago.  Last hemoglobin A1c was: Lab Results  Component Value Date   HGBA1C 8.0 04/02/2015   HGBA1C 7.5 12/31/2014   HGBA1C 7.4* 09/30/2014   Pt was on a regimen of: - On Glipizide - Metformin 2.5 - 500 mg po bid - Jardiance 25 daily >> not  covered by insurance in 2016. He was on Levemir (40 >> 70 units a day) in the past, 1 year ago.  He is now on: Metformin-glipizide 500-2.5 mg x2 tablets bid Glipizide 5 mg before dinner (added 03/2015)  Levemir 20 >> 23 >> 28 >> 35 units at bedtime. We stopped Tradjenta 5 mg in am (added 05/2014) - too expensive We stopped Jardiance. Alogliptin was too expensive, and not picked up by [facility]: 300$.  Pt checks his sugars 1-2x a day and they are: - am: 165-190 >> 118-192, 207 >> 120-156, 187 >> 124-183 >> 139-169 >> 154-193 >> 124-194 - 2h after b'fast: n/c >> 131 >> n/c - before lunch: 204 >> 136-165, 213 >> 91-136, 171 >> 161-191 >> n/c - 2h after lunch: 214 >> 132 >> n/c >> 179 >> n/c - before dinner: 87-148 >> 57 (skipped a meal), 103-173 >> 173-230 >> 146-195 >> 118-159, 231 - 2h after dinner: n/c >> 175, 216 >> 108, 183-216 >> 153-230, 279 >> 155, 187 >> n/c - bedtime: 94, 111-162, 180 >> 133-224 >> 123-199, 224 >> 170-284, 348 (ate late) >> 119-247 - nighttime: n/c No lows. Lowest sugar was 118 >> 118; ? if hypoglycemia awareness.  Highest sugar was 250 >> 247  Pt's meals are: - Breakfast: eggs, toast, ham/sausage cereal - Lunch: sandwich, vegetables, meat - Dinner: seafood, meat, vegetables - Snacks: nabs  He exercises 2x a week.  Received labs from PCP, drawn on 04/01/2015: - ACR 56.3 - CBC with differential normal - Lipids: 116/56/42/63 - CMP:  Glucose 193, BUN/creatinine 21/1.23, GFR 60, LFTs normal - PSA 0.5    - + CKD stage 3:  Lab Results  Component Value Date   BUN 14 03/28/2014   CREATININE 1.4* 03/28/2014  On ramipril. - lipids: Lab Results  Component Value Date   CHOL 131 03/28/2014   HDL 47 03/28/2014   LDLCALC 68 03/28/2014   TRIG 82 03/28/2014  On Lipitor 20.  - last eye exam was in 04/22/2015 >> No DR. - no numbness and tingling in his feet. He had a foot exam on 04/02/2015 >> normal.   ROS: Constitutional: no weight gain/loss, no fatigue, no subjective hyperthermia/hypothermia Eyes: no blurry vision, no xerophthalmia ENT: no sore throat, no nodules palpated in throat, no dysphagia/odynophagia, no hoarseness, + decreased hearing Cardiovascular: no CP/SOB/palpitations/leg swelling Respiratory: no cough/SOB Gastrointestinal: no N/V/D/no C Musculoskeletal: no muscle/joint aches Skin: no rashes Neurological: no tremors/numbness/tingling/dizziness  I reviewed pt's medications, allergies, PMH, social hx, family hx, and changes were documented in the history of present illness. Otherwise, unchanged from my initial visit note:  Past Medical History  Diagnosis Date  . Coronary artery disease     post bare-metal stent to LAD in 2001  . Diabetes mellitus   . Hypertension   . Myocardial infarct ([place]) 2001  . Atrial fibrillation [facility]) October 2013  Xarelto started; duration unknown  . Obesity   . Chronic kidney disease     Stage III   . AAA (abdominal aortic aneurysm) [facility])    Past Surgical History  Procedure Laterality Date  . Coronary angioplasty with stent placement  2001    bare-metal stent to the LAD  . Cardiac catheterization  2006    stable with primarily nonobstructive disease  . Skin graft  1981  . Hernia repair  2006  . Cardioversion  03/27/2012    Procedure: CARDIOVERSION;  Surgeon: Carlena Bjornstad, MD;  Location: [facility] ENDOSCOPY;  Service: Cardiovascular;  Laterality: N/A;   Social History  Main Topics  . Smoking status: Former Smoker -- 0.50 packs/day for 40 years    Types: Cigarettes, Pipe, Cigars    Quit date: 05/11/1999  . Smokeless tobacco: Never Used  . Alcohol Use: No  . Drug Use: No   Social History Narrative   Married    Retired: [employer]   3 grown children   Current Outpatient Prescriptions on File Prior to Visit  Medication Sig Dispense Refill  . Alogliptin Benzoate 12.5 MG TABS Take 1 tablet in am 30 tablet 2  . atorvastatin (LIPITOR) 20 MG tablet Take 20 mg by mouth 2 (two) times daily.     . BD PEN NEEDLE NANO U/F 32G X 4 MM MISC USE ONCE A DAY 100 each 2  . glipiZIDE (GLUCOTROL) 5 MG tablet TAKE ONE TABLET BY MOUTH ONCE DAILY BEFORE SUPPER 30 tablet 1  . glipiZIDE-metformin (METAGLIP) 2.5-500 MG tablet TAKE 2 TABLETS BY MOUTH 2 TIMES DAILY BEFORE A MEAL. 360 tablet 1  . Insulin Detemir (LEVEMIR FLEXTOUCH) 100 UNIT/ML Pen Inject 35 Units into the skin at bedtime. 15 mL 5  . metoprolol tartrate (LOPRESSOR) 25 MG tablet TAKE 1 TABLET BY MOUTH TWICE A DAY 180 tablet 3  . nitroGLYCERIN (NITROSTAT) 0.4 MG SL tablet Place 0.4 mg under the tongue every 5 (five) minutes as needed.    Marland Kitchen omeprazole (PRILOSEC) 20 MG capsule Take 20 mg by mouth daily.    . ramipril (ALTACE) 10 MG capsule Take 10 mg by mouth daily.    Alveda Reasons 20 MG TABS tablet TAKE 1 TABLET BY MOUTH EVERY DAY 90 tablet 2   No current facility-administered medications on file prior to visit.   Allergies  Allergen Reactions  . Omeprazole Diarrhea   Family History  Problem Relation Age of Onset  . Heart disease Father   . ALS Mother   . Diabetes Sister   . Hypertension Sister   . Heart failure Brother   . Heart attack Father   . Stroke Neg Hx    PE: BP 108/68 mmHg  Pulse 79  Temp(Src) 97.9 F (36.6 C) (Oral)  Resp 12  Wt 240 lb (108.863 kg)  SpO2 96% Body mass index is 30 kg/(m^2). Wt Readings from Last 3 Encounters:  07/03/15 240 lb (108.863 kg)  04/02/15 244 lb 9.6 oz (110.95 kg)   02/14/15 240 lb (108.863 kg)   Constitutional: overweight, in NAD Eyes: PERRLA, EOMI, no exophthalmos ENT: moist mucous membranes, no thyromegaly, no cervical lymphadenopathy Cardiovascular: RRR, No MRG Respiratory: CTA B Gastrointestinal: abdomen soft, NT, ND, BS+ Musculoskeletal: no deformities, strength intact in all 4 Skin: moist, warm, no rashes Neurological: no tremor with outstretched hands, DTR normal in all 4  ASSESSMENT: 1. DM2, insulin-dependent, uncontrolled, without complications  PLAN:  1. Patient with long-standing, uncontrolled diabetes, on oral antidiabetic regimen + basal insulin,  with improved sugars but still higher sugars at bedtime despite adding 5 mg Glipizide with dinner >> total 10 mg with dinner. Will advised him to skip the snack at night, if possible and will also increase exercise >> planning to start going to the gym - I suggested to:  Patient Instructions  Please continue: - Metformin-glipizide 500-2.5 mg x2 tablets 2x a day - Glipizide 5 mg before  dinner.  - Levemir 35 units at bedtime.  Please return in 3 months with your sugar log.  - continue checking sugars at different times of the day - check 2 times a day, rotating checks - advised for yearly eye exams, he is UTD - will check HbA1c today >> 7.7% (better) - Return to clinic in 3 months

## 2015-07-03 NOTE — Patient Instructions (Addendum)
Please continue: - Metformin-glipizide 500-2.5 mg x2 tablets 2x a day - Glipizide 5 mg before  dinner.  - Levemir 35 units at bedtime.  Please return in 3 months with your sugar log.

## 2015-07-14 ENCOUNTER — Other Ambulatory Visit: Payer: Self-pay | Admitting: Cardiovascular Disease

## 2015-08-08 ENCOUNTER — Encounter: Payer: Self-pay | Admitting: Family

## 2015-08-14 ENCOUNTER — Ambulatory Visit: Payer: Medicare Other | Admitting: Family

## 2015-08-14 ENCOUNTER — Other Ambulatory Visit (HOSPITAL_COMMUNITY): Payer: Medicare Other

## 2015-08-14 ENCOUNTER — Ambulatory Visit (INDEPENDENT_AMBULATORY_CARE_PROVIDER_SITE_OTHER): Payer: Medicare Other | Admitting: Family

## 2015-08-14 ENCOUNTER — Encounter: Payer: Self-pay | Admitting: Family

## 2015-08-14 ENCOUNTER — Ambulatory Visit (HOSPITAL_COMMUNITY)
Admission: RE | Admit: 2015-08-14 | Discharge: 2015-08-14 | Disposition: A | Discharge status: Home / Self Care | Payer: Medicare Other | Source: Ambulatory Visit | Attending: Family | Admitting: Family

## 2015-08-14 VITALS — BP 129/94 | HR 102 | Temp 98.2°F | Resp 16 | Ht 75.0 in | Wt 234.0 lb

## 2015-08-14 DIAGNOSIS — I2510 Atherosclerotic heart disease of native coronary artery without angina pectoris: Secondary | ICD-10-CM | POA: Diagnosis not present

## 2015-08-14 DIAGNOSIS — I723 Aneurysm of iliac artery: Secondary | ICD-10-CM | POA: Diagnosis not present

## 2015-08-14 DIAGNOSIS — I714 Abdominal aortic aneurysm, without rupture, unspecified: Secondary | ICD-10-CM

## 2015-08-14 DIAGNOSIS — E1122 Type 2 diabetes mellitus with diabetic chronic kidney disease: Secondary | ICD-10-CM | POA: Insufficient documentation

## 2015-08-14 DIAGNOSIS — I129 Hypertensive chronic kidney disease with stage 1 through stage 4 chronic kidney disease, or unspecified chronic kidney disease: Secondary | ICD-10-CM | POA: Insufficient documentation

## 2015-08-14 DIAGNOSIS — N183 Chronic kidney disease, stage 3 (moderate): Secondary | ICD-10-CM | POA: Insufficient documentation

## 2015-08-14 NOTE — Patient Instructions (Signed)
Abdominal Aortic Aneurysm An aneurysm is a weakened or damaged part of an artery wall that bulges from the normal force of blood pumping through the body. An abdominal aortic aneurysm is an aneurysm that occurs in the lower part of the aorta, the main artery of the body.  The major concern with an abdominal aortic aneurysm is that it can enlarge and burst (rupture) or blood can flow between the layers of the wall of the aorta through a tear (aorticdissection). Both of these conditions can cause bleeding inside the body and can be life threatening unless diagnosed and treated promptly. CAUSES  The exact cause of an abdominal aortic aneurysm is unknown. Some contributing factors are:   A hardening of the arteries caused by the buildup of fat and other substances in the lining of a blood vessel (arteriosclerosis).  Inflammation of the walls of an artery (arteritis).   Connective tissue diseases, such as Marfan syndrome.   Abdominal trauma.   An infection, such as syphilis or staphylococcus, in the wall of the aorta (infectious aortitis) caused by bacteria. RISK FACTORS  Risk factors that contribute to an abdominal aortic aneurysm may include:  Age older than 60 years.   High blood pressure (hypertension).  Male gender.  Ethnicity (white race).  Obesity.  Family history of aneurysm (first degree relatives only).  Tobacco use. PREVENTION  The following healthy lifestyle habits may help decrease your risk of abdominal aortic aneurysm:  Quitting smoking. Smoking can raise your blood pressure and cause arteriosclerosis.  Limiting or avoiding alcohol.  Keeping your blood pressure, blood sugar level, and cholesterol levels within normal limits.  Decreasing your salt intake. In somepeople, too much salt can raise blood pressure and increase your risk of abdominal aortic aneurysm.  Eating a diet low in saturated fats and cholesterol.  Increasing your fiber intake by including  whole grains, vegetables, and fruits in your diet. Eating these foods may help lower blood pressure.  Maintaining a healthy weight.  Staying physically active and exercising regularly. SYMPTOMS  The symptoms of abdominal aortic aneurysm may vary depending on the size and rate of growth of the aneurysm.Most grow slowly and do not have any symptoms. When symptoms do occur, they may include:  Pain (abdomen, side, lower back, or groin). The pain may vary in intensity. A sudden onset of severe pain may indicate that the aneurysm has ruptured.  Feeling full after eating only small amounts of food.  Nausea or vomiting or both.  Feeling a pulsating lump in the abdomen.  Feeling faint or passing out. DIAGNOSIS  Since most unruptured abdominal aortic aneurysms have no symptoms, they are often discovered during diagnostic exams for other conditions. An aneurysm may be found during the following procedures:  Ultrasonography (A one-time screening for abdominal aortic aneurysm by ultrasonography is also recommended for all men aged 65-75 years who have ever smoked).  X-ray exams.  A computed tomography (CT).  Magnetic resonance imaging (MRI).  Angiography or arteriography. TREATMENT  Treatment of an abdominal aortic aneurysm depends on the size of your aneurysm, your age, and risk factors for rupture. Medication to control blood pressure and pain may be used to manage aneurysms smaller than 6 cm. Regular monitoring for enlargement may be recommended by your caregiver if:  The aneurysm is 3-4 cm in size (an annual ultrasonography may be recommended).  The aneurysm is 4-4.5 cm in size (an ultrasonography every 6 months may be recommended).  The aneurysm is larger than 4.5 cm in   size (your caregiver may ask that you be examined by a vascular surgeon). If your aneurysm is larger than 6 cm, surgical repair may be recommended. There are two main methods for repair of an aneurysm:   Endovascular  repair (a minimally invasive surgery). This is done most often.  Open repair. This method is used if an endovascular repair is not possible.   This information is not intended to replace advice given to you by your health care provider. Make sure you discuss any questions you have with your health care provider.   Document Released: 02/03/2005 Document Revised: 08/21/2012 Document Reviewed: 05/26/2012 Elsevier Interactive Patient Education 2016 Elsevier Inc[employer]  

## 2015-08-14 NOTE — Progress Notes (Signed)
[facility] [place]  Established Abdominal Aortic Aneurysm  History of Present Illness  [name] is a 70 y.o. ([birth year]) male patient of Dr. Donnetta Hutching who underwent a CT scan for workup of hematuria. He had an incidental finding of a 3.6 cm infrarenal abdominal aortic aneurysm. 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 He had no known knowledge of this aneurysm. He has no symptoms of the aneurysm. He does have a history of prior myocardial infarction 2001 and had a bare-metal stent placed that time.  The patient does not have acute back or abdominal pain.   He has degenerative disc disease in his back, but this has improved with physical therapy and exercise.  He takes Xarelto for atrial fib., Dr. Serita Sheller is his cardiologist.   Pt Diabetic: Yes, review of records indicates his last A1C was 7.7, seeing an endocrinologist for about a year, Dr. Monna Fam (sp?) Pt smoker: former smoker, quit 2001  The patient denies claudication in legs with walking. The patient denies history of stroke or TIA symptoms.   Past Medical History  Diagnosis Date  . Coronary artery disease     post bare-metal stent to LAD in 2001  . Diabetes mellitus   . Hypertension   . Myocardial infarct ([place]) 2001  . Atrial fibrillation [facility]) October 2013    Xarelto started; duration unknown  . Obesity   . Chronic kidney disease     Stage III   . AAA (abdominal aortic aneurysm) [facility])    Past Surgical History  Procedure Laterality Date  . Coronary angioplasty with stent placement  2001    bare-metal stent to the LAD  . Cardiac catheterization  2006    stable with primarily nonobstructive disease  . Skin graft  1981  . Hernia repair  2006  . Cardioversion  03/27/2012    Procedure: CARDIOVERSION;  Surgeon: Carlena Bjornstad, MD;  Location: [facility] ENDOSCOPY;  Service: Cardiovascular;  Laterality: N/A;   Social History Social History   Social History  . Marital  Status: Married    Spouse Name: N/A  . Number of Children: N/A  . Years of Education: N/A   Occupational History  . Not on file.   Social History Main Topics  . Smoking status: Former Smoker -- 0.50 packs/day for 40 years    Types: Cigarettes, Pipe, Cigars    Quit date: 05/11/1999  . Smokeless tobacco: Never Used  . Alcohol Use: No  . Drug Use: No  . Sexual Activity: Yes   Other Topics Concern  . Not on file   Social History Narrative   Married    Retired: [employer]   3 grown children      Family History Family History  Problem Relation Age of Onset  . Heart disease Father   . ALS Mother   . Diabetes Sister   . Hypertension Sister   . Heart failure Brother   . Heart attack Father   . Stroke Neg Hx     Current Outpatient Prescriptions on File Prior to Visit  Medication Sig Dispense Refill  . Alogliptin Benzoate 12.5 MG TABS Take 1 tablet in am 30 tablet 2  . atorvastatin (LIPITOR) 20 MG tablet Take 20 mg by mouth 2 (two) times daily.     . BD PEN NEEDLE NANO U/F 32G X 4 MM MISC USE ONCE A DAY 100 each 2  . glipiZIDE (GLUCOTROL) 5 MG tablet TAKE ONE TABLET  BY MOUTH ONCE DAILY BEFORE SUPPER 30 tablet 1  . glipiZIDE-metformin (METAGLIP) 2.5-500 MG tablet TAKE 2 TABLETS BY MOUTH 2 TIMES DAILY BEFORE A MEAL. 360 tablet 1  . Insulin Detemir (LEVEMIR FLEXTOUCH) 100 UNIT/ML Pen Inject 35 Units into the skin at bedtime. 15 mL 5  . metoprolol tartrate (LOPRESSOR) 25 MG tablet TAKE 1 TABLET BY MOUTH TWICE A DAY 180 tablet 3  . nitroGLYCERIN (NITROSTAT) 0.4 MG SL tablet Place 0.4 mg under the tongue every 5 (five) minutes as needed.    Marland Kitchen omeprazole (PRILOSEC) 20 MG capsule Take 20 mg by mouth daily.    . ramipril (ALTACE) 10 MG capsule Take 10 mg by mouth daily.    Alveda Reasons 20 MG TABS tablet TAKE ONE TABLET BY MOUTH ONCE DAILY 90 tablet 0   No current facility-administered medications on file prior to visit.   Allergies  Allergen Reactions  . Omeprazole Diarrhea    ROS: See  HPI for pertinent positives and negatives.  Physical Examination  Filed Vitals:   08/14/15 1034  BP: 129/94  Pulse: 102  Temp: 98.2 F (36.8 C)  TempSrc: Oral  Resp: 16  Height: 6\' 3"  (1.905 m)  Weight: 234 lb (106.142 kg)  SpO2: 96%   Body mass index is 29.25 kg/(m^2).  General: A&O x 3, WD, obese male.  Pulmonary: Sym exp, good air movt, CTAB, no rales, rhonchi, & wheezing.  Cardiac: Irregular rhythm and rate.   Carotid Bruits  Left  Right    Negative  Negative    Radial pulses are palpable bilaterally.  Aorta is not palpable, bilateral femoral pulses are palpable.  Popliteal pulses are 2+ palpable bilaterally. DP and PT pedal pulses are 2+ palpable bilaterally.   Gastrointestinal: soft, NTND, -G/R, - HSM, - palpable masses, - CVAT B.  Musculoskeletal: M/S 5/5 throughout, Extremities without ischemic changes, no peripheral edema.  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  Current size:  3.87 cm (Date: 08/14/2015); Right common iliac artery measures 1.66 cm x 1.81 cm  Today's measurements are c/w exams from 2015 and earlier.  Medical Decision Making  The patient is a 70 y.o. male who presents with asymptomatic AAA with no increase in size. Ectasia of right iliac artery noted on previous duplexes seems to be less in diameter on this exam  Based on this patient's exam and diagnostic studies, the patient will follow up in 1 year  with the following studies: bilateral aortoiliac duplex.  Consideration for repair of AAA would be made when the size is 5.5 cm, growth > 1 cm/yr, and symptomatic status.       Consideration for repair of iliac  artery aneurysm would be made when the size is 3 cm or greater.  I emphasized the importance of maximal medical management including strict control of blood pressure, blood glucose, and lipid levels, antiplatelet agents, obtaining regular exercise, and continued cessation of smoking.   The patient was given information about AAA including signs, symptoms, treatment, and how to minimize the risk of enlargement and rupture of aneurysms.    The patient was advised to call 911 should the patient experience sudden onset abdominal  or back pain.   Thank you for allowing [place] to participate in this patient's care.  Clemon Chambers, RN, MSN, FNP-C [facility] [place] Office: [facility] Physician: Oneida Alar  08/14/2015, 10:38 AM

## 2015-08-18 ENCOUNTER — Telehealth: Payer: Self-pay | Admitting: Cardiovascular Disease

## 2015-08-18 NOTE — Telephone Encounter (Signed)
Pt BP was 126/93 at 8a, pt's wife states BP running high since Thursday,  Ran 116/84 Sunday   pls advise [phone] or cell [phone]

## 2015-08-18 NOTE — Telephone Encounter (Signed)
Patient st his diastolic BP has been a little elevated (in the 80s and 90s).  He has been checking his BP prior to taking medicine. Confirmed with patient he has no symptoms - he denies CP, SOB, HA, and lightheadedness.  Instructed patient to check BP 1.5-2 hours AFTER taking BP medications and to call in about a week with results to report. Patient was grateful for call.

## 2015-08-28 ENCOUNTER — Other Ambulatory Visit: Payer: Self-pay | Admitting: Internal Medicine

## 2015-09-01 ENCOUNTER — Other Ambulatory Visit: Payer: Self-pay | Admitting: Internal Medicine

## 2015-09-09 ENCOUNTER — Other Ambulatory Visit: Payer: Self-pay | Admitting: *Deleted

## 2015-09-09 DIAGNOSIS — I723 Aneurysm of iliac artery: Secondary | ICD-10-CM

## 2015-09-12 ENCOUNTER — Encounter: Payer: Self-pay | Admitting: Cardiovascular Disease

## 2015-09-12 ENCOUNTER — Ambulatory Visit (INDEPENDENT_AMBULATORY_CARE_PROVIDER_SITE_OTHER): Payer: Medicare Other | Admitting: Cardiovascular Disease

## 2015-09-12 VITALS — BP 100/70 | HR 82 | Ht 75.0 in | Wt 242.4 lb

## 2015-09-12 DIAGNOSIS — I2510 Atherosclerotic heart disease of native coronary artery without angina pectoris: Secondary | ICD-10-CM

## 2015-09-12 DIAGNOSIS — I481 Persistent atrial fibrillation: Secondary | ICD-10-CM

## 2015-09-12 DIAGNOSIS — I10 Essential (primary) hypertension: Secondary | ICD-10-CM | POA: Diagnosis not present

## 2015-09-12 DIAGNOSIS — I4819 Other persistent atrial fibrillation: Secondary | ICD-10-CM

## 2015-09-12 NOTE — Progress Notes (Signed)
Chief Complaint  Patient presents with  . Follow-up    History of Present Illness: 70 yo WM with history of CAD, HTN, hyperlipidemia, atrial fibrillation, AAA and DM who is here today for cardiac follow up. He has been followed in the past by Dr. Olevia Perches. In 2001 he had an anterior wall MI treated with a bare-metal stent to the LAD. He underwent catheterization in 2006 and the stent was patent and there was an 80% narrowing in the diagonal branch. His ejection fraction was 50%. He was noted to be in atrial fibrillation in October 2013 and was seen by Truitt Merle, NP. He was started on Xarelto and DCCV was arranged on 03/27/12 with return to [facility]. Echo 03/02/12 with moderate LVH, LVEF 40-45%, mild MR. AAA followed in [facility].   He is here today for follow up. He is doing well. He has had no chest pain or SOB. No palpitations, near syncope or syncope. He is taking his Xarelto and having no bleeding issues.   Primary Care Physician: Daiva Eves   Past Medical History  Diagnosis Date  . Coronary artery disease     post bare-metal stent to LAD in 2001  . Diabetes mellitus   . Hypertension   . Myocardial infarct ([place]) 2001  . Atrial fibrillation [facility]) October 2013    Xarelto started; duration unknown  . Obesity   . Chronic kidney disease     Stage III   . AAA (abdominal aortic aneurysm) [facility])     Past Surgical History  Procedure Laterality Date  . Coronary angioplasty with stent placement  2001    bare-metal stent to the LAD  . Cardiac catheterization  2006    stable with primarily nonobstructive disease  . Skin graft  1981  . Hernia repair  2006  . Cardioversion  03/27/2012    Procedure: CARDIOVERSION;  Surgeon: Carlena Bjornstad, MD;  Location: [facility] ENDOSCOPY;  Service: Cardiovascular;  Laterality: N/A;    Current Outpatient Prescriptions  Medication Sig Dispense Refill  . atorvastatin (LIPITOR) 20 MG tablet Take 20 mg by mouth 2 (two) times daily.     . BD PEN NEEDLE NANO U/F 32G X  4 MM MISC USE AS DIRECTED ONCE  DAILY 100 each 5  . glipiZIDE (GLUCOTROL) 5 MG tablet TAKE ONE TABLET BY MOUTH ONCE DAILY BEFORE SUPPER 30 tablet 2  . glipiZIDE-metformin (METAGLIP) 2.5-500 MG tablet TAKE 2 TABLETS BY MOUTH 2 TIMES DAILY BEFORE A MEAL. 360 tablet 1  . Insulin Detemir (LEVEMIR FLEXTOUCH) 100 UNIT/ML Pen Inject 35 Units into the skin at bedtime. 15 mL 5  . metoprolol tartrate (LOPRESSOR) 25 MG tablet TAKE 1 TABLET BY MOUTH TWICE A DAY 180 tablet 3  . nitroGLYCERIN (NITROSTAT) 0.4 MG SL tablet Place 0.4 mg under the tongue every 5 (five) minutes as needed.    Marland Kitchen omeprazole (PRILOSEC) 20 MG capsule Take 20 mg by mouth daily.    . ramipril (ALTACE) 10 MG capsule Take 10 mg by mouth daily.    Alveda Reasons 20 MG TABS tablet TAKE ONE TABLET BY MOUTH ONCE DAILY 90 tablet 0   No current facility-administered medications for this visit.    Allergies  Allergen Reactions  . Omeprazole Diarrhea    Social History   Social History  . Marital Status: Married    Spouse Name: N/A  . Number of Children: N/A  . Years of Education: N/A   Occupational History  . Not on file.   Social  History Main Topics  . Smoking status: Former Smoker -- 0.50 packs/day for 40 years    Types: Cigarettes, Pipe, Cigars    Quit date: 05/11/1999  . Smokeless tobacco: Never Used  . Alcohol Use: No  . Drug Use: No  . Sexual Activity: Yes   Other Topics Concern  . Not on file   Social History Narrative   Married    Retired: [employer]   3 grown children       Family History  Problem Relation Age of Onset  . Heart disease Father   . ALS Mother   . Diabetes Sister   . Hypertension Sister   . Heart failure Brother   . Heart attack Father   . Stroke Neg Hx     Review of Systems:  As stated in the HPI and otherwise negative.   BP 100/70 mmHg  Pulse 82  Ht 6\' 3"  (1.905 m)  Wt 242 lb 6.4 oz (109.952 kg)  BMI 30.30 kg/m2  SpO2 96%  Physical Examination: General: Well developed, well nourished,  NAD HEENT: OP clear, mucus membranes moist SKIN: warm, dry. No rashes. Neuro: No focal deficits Musculoskeletal: Muscle strength 5/5 all ext Psychiatric: Mood and affect normal Neck: No JVD, no carotid bruits, no thyromegaly, no lymphadenopathy. Lungs:Clear bilaterally, no wheezes, rhonci, crackles Cardiovascular: Irregular irregular. No murmurs, gallops or rubs. Abdomen:Soft. Bowel sounds present. Non-tender.  Extremities: No lower extremity edema. Pulses are 2 + in the bilateral DP/PT.  Echo 03/02/12:  Left ventricle: The cavity size was normal. Wall thickness was increased in a pattern of moderate LVH. Systolic function was mildly to moderately reduced. The estimated ejection fraction was in the range of 40% to 45%. Wall motion was normal; there were no regional wall motion abnormalities. - Mitral valve: Mild regurgitation. - Left atrium: The atrium was moderately dilated. - Atrial septum: No defect or patent foramen ovale was identified. - Pulmonary arteries: PA peak pressure: 73mm Hg (S).  EKG:  EKG is ordered today. The ekg ordered today demonstrates Atrial fib, Incomplete RBBB. Poor R wave progression  Recent Labs: No results found for requested labs within last 365 days.   Lipid Panel Followed in primary care   Wt Readings from Last 3 Encounters:  09/12/15 242 lb 6.4 oz (109.952 kg)  08/14/15 234 lb (106.142 kg)  07/03/15 240 lb (108.863 kg)     Other studies Reviewed: Additional studies/ records that were reviewed today include:  Review of the above records demonstrates:  Assessment and Plan:   1. CAD: Stable. He had a stent placed in 2001. He has had no angina. Continue ASA, Ace-inhibitor and statin. Lipids are followed in primary care and controlled per pt. He will need to have repeat lipid profile at f/u primary care visit in two weeks. No changes today.   2. Atrial fibrillation: Asymptomatic. Rate controlled on Lopressor. Will continue Xarelto for  anticoagulation. He does not wish to seek EP referral for ablation or other therapies.   3. HYPERTENSION: BP well controlled. No changes today.   Current medicines are reviewed at length with the patient today.  The patient does not have concerns regarding medicines.  The following changes have been made:  no change  Labs/ tests ordered today include:  Orders Placed This Encounter  Procedures  . EKG 12-Lead    Disposition:   FU with me in 12  months  Signed, Lauree Chandler, MD 09/12/2015 4:38 PM    [facility] [id number]  [address] [place], [place]  [address] Phone: [phone]; Fax: [phone]

## 2015-09-12 NOTE — Patient Instructions (Signed)

## 2015-10-02 ENCOUNTER — Ambulatory Visit (INDEPENDENT_AMBULATORY_CARE_PROVIDER_SITE_OTHER): Payer: Medicare Other | Admitting: Internal Medicine

## 2015-10-02 ENCOUNTER — Encounter: Payer: Self-pay | Admitting: Internal Medicine

## 2015-10-02 ENCOUNTER — Other Ambulatory Visit (INDEPENDENT_AMBULATORY_CARE_PROVIDER_SITE_OTHER): Payer: Medicare Other | Admitting: *Deleted

## 2015-10-02 VITALS — BP 118/64 | HR 77 | Temp 97.4°F | Resp 12 | Wt 242.0 lb

## 2015-10-02 DIAGNOSIS — IMO0002 Reserved for concepts with insufficient information to code with codable children: Secondary | ICD-10-CM

## 2015-10-02 DIAGNOSIS — E1151 Type 2 diabetes mellitus with diabetic peripheral angiopathy without gangrene: Secondary | ICD-10-CM

## 2015-10-02 DIAGNOSIS — E1165 Type 2 diabetes mellitus with hyperglycemia: Secondary | ICD-10-CM | POA: Diagnosis not present

## 2015-10-02 LAB — POCT GLYCOSYLATED HEMOGLOBIN (HGB A1C): Hemoglobin A1C: 8.8

## 2015-10-02 MED ORDER — INSULIN ISOPHANE & REGULAR (HUMAN 70-30)100 UNIT/ML KWIKPEN: PEN_INJECTOR | SUBCUTANEOUS | Status: DC

## 2015-10-02 MED ORDER — INSULIN PEN NEEDLE 32G X 4 MM MISC: Status: DC

## 2015-10-02 NOTE — Progress Notes (Signed)
Patient ID: [name], male   DOB: April 29, 1946, 70 y.o.   MRN: [id number]  HPI: [name] is a 70 y.o.-year-old male, returning for f/u for DM2, dx 1995, insulin-dependent, uncontrolled, with complications (CKD stage 3, CAD - AMI, s/p stent LAD 2001). Last visit 3 mo ago.  Last hemoglobin A1c was: Lab Results  Component Value Date   HGBA1C 7.7 07/03/2015   HGBA1C 8.0 04/02/2015   HGBA1C 7.5 12/31/2014   Pt was on a regimen of: - On Glipizide - Metformin 2.5 - 500 mg po bid - Jardiance 25 daily >> not  covered by insurance in 2016. He was on Levemir (40 >> 70 units a day) in the past, 1 year ago.  He is now on: Metformin-glipizide 1000-5 mg 2x a day Glipizide 5 >> 10 mg before dinner (added 03/2015)  Levemir 20 >> 23 >> 28 >> 35 units at bedtime. We stopped Tradjenta 5 mg in am (added 05/2014) - too expensive We stopped Jardiance. Alogliptin was too expensive, and not picked up by [facility]: 300$.  Pt checks his sugars 1-2x a day and they are: - am: 165-190 >> 118-192, 207 >> 120-156, 187 >> 124-183 >> 139-169 >> 154-193 >> 124-194 >> 161-224 - 2h after b'fast: n/c >> 131 >> n/c - before lunch: 204 >> 136-165, 213 >> 91-136, 171 >> 161-191 >> n/c - 2h after lunch: 214 >> 132 >> n/c >> 179 >> n/c - before dinner: 87-148 >> 57 (skipped a meal), 103-173 >> 173-230 >> 146-195 >> 118-159, 231 >> 175 - 2h after dinner: n/c >> 175, 216 >> 108, 183-216 >> 153-230, 279 >> 155, 187 >> n/c >> 214-270 - bedtime: 94, 111-162, 180 >> 133-224 >> 123-199, 224 >> 170-284, 348 (ate late) >> 119-247 >> 164, 201-287 - nighttime: n/c No lows. Lowest sugar was 118 >> 118 >> 140; ? if hypoglycemia awareness.  Highest sugar was 250 >> 247 >> 287  Pt's meals are: - Breakfast: eggs, toast, ham/sausage cereal - Lunch: sandwich, vegetables, meat - Dinner: seafood, meat, vegetables - Snacks: nabs  He exercises 2x a week.  Received labs from PCP, drawn on 04/01/2015: - ACR 56.3 - CBC with  differential normal - Lipids: 116/56/42/63 - CMP: Glucose 193, BUN/creatinine 21/1.23, GFR 60, LFTs normal - PSA 0.5    - + CKD stage 3:  Lab Results  Component Value Date   BUN 14 03/28/2014   CREATININE 1.4* 03/28/2014  On ramipril. - lipids: Lab Results  Component Value Date   CHOL 131 03/28/2014   HDL 47 03/28/2014   LDLCALC 68 03/28/2014   TRIG 82 03/28/2014  On Lipitor 20.  - last eye exam was in 04/22/2015 >> No DR. - no numbness and tingling in his feet. He had a foot exam on 04/02/2015 >> normal.   ROS: Constitutional: no weight gain/loss, no fatigue, no subjective hyperthermia/hypothermia Eyes: no blurry vision, no xerophthalmia ENT: no sore throat, no nodules palpated in throat, no dysphagia/odynophagia, no hoarseness, + decreased hearing Cardiovascular: no CP/SOB/palpitations/leg swelling Respiratory: no cough/SOB Gastrointestinal: no N/V/D/no C Musculoskeletal: no muscle/joint aches Skin: no rashes Neurological: no tremors/numbness/tingling/dizziness  I reviewed pt's medications, allergies, PMH, social hx, family hx, and changes were documented in the history of present illness. Otherwise, unchanged from my initial visit note:  Past Medical History  Diagnosis Date  . Coronary artery disease     post bare-metal stent to LAD in 2001  . Diabetes mellitus   . Hypertension   .  Myocardial infarct ([place]) 2001  . Atrial fibrillation [facility]) October 2013    Xarelto started; duration unknown  . Obesity   . Chronic kidney disease     Stage III   . AAA (abdominal aortic aneurysm) [facility])    Past Surgical History  Procedure Laterality Date  . Coronary angioplasty with stent placement  2001    bare-metal stent to the LAD  . Cardiac catheterization  2006    stable with primarily nonobstructive disease  . Skin graft  1981  . Hernia repair  2006  . Cardioversion  03/27/2012    Procedure: CARDIOVERSION;  Surgeon: Carlena Bjornstad, MD;  Location: [facility] ENDOSCOPY;  Service:  Cardiovascular;  Laterality: N/A;   Social History Main Topics  . Smoking status: Former Smoker -- 0.50 packs/day for 40 years    Types: Cigarettes, Pipe, Cigars    Quit date: 05/11/1999  . Smokeless tobacco: Never Used  . Alcohol Use: No  . Drug Use: No   Social History Narrative   Married    Retired: [employer]   3 grown children   Current Outpatient Prescriptions on File Prior to Visit  Medication Sig Dispense Refill  . atorvastatin (LIPITOR) 20 MG tablet Take 20 mg by mouth 2 (two) times daily.     . BD PEN NEEDLE NANO U/F 32G X 4 MM MISC USE AS DIRECTED ONCE  DAILY 100 each 5  . glipiZIDE (GLUCOTROL) 5 MG tablet TAKE ONE TABLET BY MOUTH ONCE DAILY BEFORE SUPPER 30 tablet 2  . glipiZIDE-metformin (METAGLIP) 2.5-500 MG tablet TAKE 2 TABLETS BY MOUTH 2 TIMES DAILY BEFORE A MEAL. 360 tablet 1  . Insulin Detemir (LEVEMIR FLEXTOUCH) 100 UNIT/ML Pen Inject 35 Units into the skin at bedtime. 15 mL 5  . metoprolol tartrate (LOPRESSOR) 25 MG tablet TAKE 1 TABLET BY MOUTH TWICE A DAY 180 tablet 3  . nitroGLYCERIN (NITROSTAT) 0.4 MG SL tablet Place 0.4 mg under the tongue every 5 (five) minutes as needed.    Marland Kitchen omeprazole (PRILOSEC) 20 MG capsule Take 20 mg by mouth daily.    . ramipril (ALTACE) 10 MG capsule Take 10 mg by mouth daily.    Alveda Reasons 20 MG TABS tablet TAKE ONE TABLET BY MOUTH ONCE DAILY 90 tablet 0   No current facility-administered medications on file prior to visit.   Allergies  Allergen Reactions  . Omeprazole Diarrhea   Family History  Problem Relation Age of Onset  . Heart disease Father   . ALS Mother   . Diabetes Sister   . Hypertension Sister   . Heart failure Brother   . Heart attack Father   . Stroke Neg Hx    PE: BP 118/64 mmHg  Pulse 77  Temp(Src) 97.4 F (36.3 C) (Oral)  Resp 12  Wt 242 lb (109.77 kg)  SpO2 94% Body mass index is 30.25 kg/(m^2). Wt Readings from Last 3 Encounters:  10/02/15 242 lb (109.77 kg)  09/12/15 242 lb 6.4 oz (109.952 kg)   08/14/15 234 lb (106.142 kg)   Constitutional: overweight, in NAD Eyes: PERRLA, EOMI, no exophthalmos ENT: moist mucous membranes, no thyromegaly, no cervical lymphadenopathy Cardiovascular: RRR, No MRG Respiratory: CTA B Gastrointestinal: abdomen soft, NT, ND, BS+ Musculoskeletal: no deformities, strength intact in all 4 Skin: moist, warm, no rashes Neurological: no tremor with outstretched hands, DTR normal in all 4  ASSESSMENT: 1. DM2, insulin-dependent, uncontrolled, with complications - CAD - CKD stage 3  PLAN:  1. Patient with long-standing, uncontrolled diabetes,  on oral antidiabetic regimen + basal insulin, with worse sugars since last visit. - Sugars are higher at bedtime and subsequently in am >> will stop Basal insulin and pre-dinner glipizide (will continue with the Metformin-Glipizide combination pill only) and add 70/30 insulin before b'fast and dinner. - I suggested to:  Patient Instructions  Please stop Levemir and Glipizide and start: - Humulin 70/30 - inject 30 min before meals: 15 units before b'fast 25 units before dinner  Continue: - Metformin-glipizide 500-2.5 mg x2 tablets 2x a day  Please return in 1.5 months with your sugar log.  - continue checking sugars at different times of the day - check 2 times a day, rotating checks - advised for yearly eye exams, he is UTD - will check HbA1c today >> 8.8% (higher) - Return to clinic in 1.5 months

## 2015-10-02 NOTE — Patient Instructions (Signed)
Please stop Levemir and Glipizide and start: - Humulin 70/30 - inject 30 min before meals: 15 units before b'fast 25 units before dinner  Continue: - Metformin-glipizide 500-2.5 mg x2 tablets 2x a day  Please return in 1.5 months with your sugar log.

## 2015-10-03 ENCOUNTER — Other Ambulatory Visit: Payer: Self-pay | Admitting: Internal Medicine

## 2015-10-09 ENCOUNTER — Encounter: Payer: Self-pay | Admitting: Internal Medicine

## 2015-10-09 ENCOUNTER — Other Ambulatory Visit: Payer: Self-pay | Admitting: Cardiovascular Disease

## 2015-10-09 NOTE — Progress Notes (Signed)
Received labs from PCP from 09/30/2015: - Hep C virus antibody <0.1 - Lipids: 126/68/44/68 - CMP normal, except glucose 209, BUN/creatinine 22/1.39, GFR 51, potassium 5.4 (3.5-5.2), CO2 17 (18-29) - CBC with differential normal - B12 vitamin 305 [id number])

## 2015-10-21 ENCOUNTER — Encounter: Payer: Self-pay | Admitting: Cardiovascular Disease

## 2015-11-03 ENCOUNTER — Telehealth: Payer: Self-pay | Admitting: Family Medicine

## 2015-11-03 MED ORDER — INSULIN PEN NEEDLE 32G X 4 MM MISC: Status: DC

## 2015-11-03 NOTE — Telephone Encounter (Signed)
Rx for pen needles submitted.

## 2015-11-03 NOTE — Telephone Encounter (Signed)
Patient needs a new prescription for the Insulin pen needles sent to [facility] in [place] the present one due to changed instructions.

## 2015-11-13 ENCOUNTER — Encounter: Payer: Self-pay | Admitting: Internal Medicine

## 2015-11-13 ENCOUNTER — Ambulatory Visit (INDEPENDENT_AMBULATORY_CARE_PROVIDER_SITE_OTHER): Payer: Medicare Other | Admitting: Internal Medicine

## 2015-11-13 VITALS — BP 124/82 | HR 73 | Ht 74.0 in | Wt 245.0 lb

## 2015-11-13 DIAGNOSIS — E1165 Type 2 diabetes mellitus with hyperglycemia: Secondary | ICD-10-CM

## 2015-11-13 DIAGNOSIS — IMO0002 Reserved for concepts with insufficient information to code with codable children: Secondary | ICD-10-CM

## 2015-11-13 DIAGNOSIS — E1151 Type 2 diabetes mellitus with diabetic peripheral angiopathy without gangrene: Secondary | ICD-10-CM | POA: Diagnosis not present

## 2015-11-13 MED ORDER — ALBIGLUTIDE 50 MG ~~LOC~~ PEN: PEN_INJECTOR | SUBCUTANEOUS | Status: DC

## 2015-11-13 NOTE — Progress Notes (Signed)
Patient ID: [name], male   DOB: Oct 12, 1945, 70 y.o.   MRN: [id number]  HPI: [name] is a 70 y.o.-year-old male, returning for f/u for DM2, dx 1995, insulin-dependent, uncontrolled, with complications (CKD stage 3, CAD - AMI, s/p stent LAD 2001). Last visit 1.5 mo ago.  Last hemoglobin A1c was: Lab Results  Component Value Date   HGBA1C 8.8 10/02/2015   HGBA1C 7.7 07/03/2015   HGBA1C 8.0 04/02/2015   He was on: Metformin-glipizide 1000-5 mg 2x a day Glipizide 5 >> 10 mg before dinner (added 03/2015)  Levemir 20 >> 23 >> 28 >> 35 units at bedtime. We stopped Tradjenta 5 mg in am (added 05/2014) - too expensive We stopped Jardiance. Alogliptin was too expensive, and not picked up by [facility]: 300$.  Now on: - Humulin 70/30 - inject 30 min before meals: 15 units before b'fast 25 units before dinner - Metformin-glipizide 500-2.5 mg x2 tablets 2x a day  Pt checks his sugars 1-2x a day and they are: - am: 165-190 >> 118-192, 207 >> 120-156, 187 >> 124-183 >> 139-169 >> 154-193 >> 124-194 >> 161-224 >> 145, 159-211 - 2h after b'fast: n/c >> 131 >> n/c - before lunch: 204 >> 136-165, 213 >> 91-136, 171 >> 161-191 >> n/c - 2h after lunch: 214 >> 132 >> n/c >> 179 >> n/c - before dinner: 87-148 >> 57 (skipped a meal), 103-173 >> 173-230 >> 146-195 >> 118-159, 231 >> 175 >> 169-257 - 2h after dinner: n/c >> 175, 216 >> 108, 183-216 >> 153-230, 279 >> 155, 187 >> n/c >> 214-270 >> 146, 210-255 - bedtime: 94, 111-162, 180 >> 133-224 >> 123-199, 224 >> 170-284, 348 (ate late) >> 119-247 >> 164, 201-287 >> 85, 145-257 - nighttime: n/c No lows. Lowest sugar was 118 >> 118 >> 140 >> 85; ? if hypoglycemia awareness.  Highest sugar was 250 >> 247 >> 287 >> 257.  Pt's meals are: - Breakfast: eggs, toast, ham/sausage or cereals + milk - Lunch: sandwich, vegetables, meat - Dinner: seafood, meat, vegetables - Snacks: nabs  He exercises 2x a week.  Received labs from  PCP: 09/30/2015: - Hep C virus antibody <0.1 - Lipids: 126/68/44/68 - CMP normal, except glucose 209, BUN/creatinine 22/1.39, GFR 51, potassium 5.4 (3.5-5.2), CO2 17 (18-29) - CBC with differential normal - B12 vitamin 305 [id number])  04/01/2015: - ACR 56.3 - CBC with differential normal - Lipids: 116/56/42/63 - CMP: Glucose 193, BUN/creatinine 21/1.23, GFR 60, LFTs normal - PSA 0.5    - + CKD stage 3:  Lab Results  Component Value Date   BUN 14 03/28/2014   CREATININE 1.4* 03/28/2014  On ramipril. - lipids: Lab Results  Component Value Date   CHOL 131 03/28/2014   HDL 47 03/28/2014   LDLCALC 68 03/28/2014   TRIG 82 03/28/2014  On Lipitor 20.  - last eye exam was in 04/22/2015 >> No DR. - no numbness and tingling in his feet. He had a foot exam on 04/02/2015 >> normal.   ROS: Constitutional: no weight gain/loss, no fatigue, no subjective hyperthermia/hypothermia Eyes: no blurry vision, no xerophthalmia ENT: no sore throat, no nodules palpated in throat, no dysphagia/odynophagia, no hoarseness, + decreased hearing Cardiovascular: no CP/SOB/palpitations/leg swelling Respiratory: no cough/SOB Gastrointestinal: no N/V/D/no C Musculoskeletal: no muscle/joint aches Skin: no rashes Neurological: no tremors/numbness/tingling/dizziness  I reviewed pt's medications, allergies, PMH, social hx, family hx, and changes were documented in the history of present illness. Otherwise, unchanged from my  initial visit note:  Past Medical History  Diagnosis Date  . Coronary artery disease     post bare-metal stent to LAD in 2001  . Diabetes mellitus   . Hypertension   . Myocardial infarct ([place]) 2001  . Atrial fibrillation [facility]) October 2013    Xarelto started; duration unknown  . Obesity   . Chronic kidney disease     Stage III   . AAA (abdominal aortic aneurysm) [facility])    Past Surgical History  Procedure Laterality Date  . Coronary angioplasty with stent placement  2001     bare-metal stent to the LAD  . Cardiac catheterization  2006    stable with primarily nonobstructive disease  . Skin graft  1981  . Hernia repair  2006  . Cardioversion  03/27/2012    Procedure: CARDIOVERSION;  Surgeon: Carlena Bjornstad, MD;  Location: [facility] ENDOSCOPY;  Service: Cardiovascular;  Laterality: N/A;   Social History Main Topics  . Smoking status: Former Smoker -- 0.50 packs/day for 40 years    Types: Cigarettes, Pipe, Cigars    Quit date: 05/11/1999  . Smokeless tobacco: Never Used  . Alcohol Use: No  . Drug Use: No   Social History Narrative   Married    Retired: [employer]   3 grown children   Current Outpatient Prescriptions on File Prior to Visit  Medication Sig Dispense Refill  . atorvastatin (LIPITOR) 20 MG tablet Take 20 mg by mouth 2 (two) times daily.     Marland Kitchen glipiZIDE-metformin (METAGLIP) 2.5-500 MG tablet TAKE TWO TABLETS BY MOUTH TWICE DAILY BEFORE MEAL(S) 360 tablet 0  . Insulin Isophane & Regular Human (HUMULIN 70/30 MIX) (70-30) 100 UNIT/ML PEN Inject under skin 15 units before b'fast and 25 units before dinner 15 mL 2  . Insulin Pen Needle (BD PEN NEEDLE NANO U/F) 32G X 4 MM MISC USE AS DIRECTED 2x  DAILY 200 each 5  . metoprolol tartrate (LOPRESSOR) 25 MG tablet TAKE 1 TABLET BY MOUTH TWICE A DAY 180 tablet 3  . nitroGLYCERIN (NITROSTAT) 0.4 MG SL tablet Place 0.4 mg under the tongue every 5 (five) minutes as needed.    . ramipril (ALTACE) 10 MG capsule Take 10 mg by mouth daily.    Alveda Reasons 20 MG TABS tablet TAKE ONE TABLET BY MOUTH ONCE DAILY 90 tablet 3  . omeprazole (PRILOSEC) 20 MG capsule Take 20 mg by mouth daily. Reported on 11/13/2015     No current facility-administered medications on file prior to visit.   Allergies  Allergen Reactions  . Omeprazole Diarrhea   Family History  Problem Relation Age of Onset  . Heart disease Father   . ALS Mother   . Diabetes Sister   . Hypertension Sister   . Heart failure Brother   . Heart attack Father   .  Stroke Neg Hx    PE: BP 124/82 mmHg  Pulse 73  Ht 6\' 2"  (1.88 m)  Wt 245 lb (111.131 kg)  BMI 31.44 kg/m2  SpO2 96% Body mass index is 31.44 kg/(m^2). Wt Readings from Last 3 Encounters:  11/13/15 245 lb (111.131 kg)  10/02/15 242 lb (109.77 kg)  09/12/15 242 lb 6.4 oz (109.952 kg)   Constitutional: overweight, in NAD Eyes: PERRLA, EOMI, no exophthalmos ENT: moist mucous membranes, no thyromegaly, no cervical lymphadenopathy Cardiovascular: RRR, No MRG Respiratory: CTA B Gastrointestinal: abdomen soft, NT, ND, BS+ Musculoskeletal: no deformities, strength intact in all 4 Skin: moist, warm, no rashes Neurological: no tremor  with outstretched hands, DTR normal in all 4  ASSESSMENT: 1. DM2, insulin-dependent, uncontrolled, with complications - CAD - CKD stage 3  PLAN:  1. Patient with long-standing, uncontrolled diabetes, on oral antidiabetic regimen + basal insulin, with worse sugars since last visit. - Sugars are higher at bedtime and subsequently in am >> will stop Basal insulin and pre-dinner glipizide (will continue with the Metformin-Glipizide combination pill only) and add 70/30 insulin before b'fast and dinner. - I suggested to:  Patient Instructions  Please continue: - Metformin-glipizide 500-2.5 mg x2 tablets 2x a day  Change: - Humulin 70/30 - inject 30 min before meals: 20 units before b'fast 25 units before dinner  Please check some sugars before lunch.  Start: - Tanzeum 50 mg weekly.  Please return in 1.5 months with your sugar log.   - continue checking sugars at different times of the day - check 2 times a day, rotating checks - advised for yearly eye exams, he is UTD - will check HbA1c today >> 8.8% (higher) - Return to clinic in 1.5 months

## 2015-11-13 NOTE — Patient Instructions (Signed)
Please continue: - Metformin-glipizide 500-2.5 mg x2 tablets 2x a day  Change: - Humulin 70/30 - inject 30 min before meals: 20 units before b'fast 25 units before dinner  Please check some sugars before lunch.  Start: - Tanzeum 50 mg weekly.  Please return in 1.5 months with your sugar log.

## 2016-01-02 ENCOUNTER — Encounter: Payer: Self-pay | Admitting: Internal Medicine

## 2016-01-02 ENCOUNTER — Ambulatory Visit (INDEPENDENT_AMBULATORY_CARE_PROVIDER_SITE_OTHER): Payer: Medicare Other | Admitting: Internal Medicine

## 2016-01-02 VITALS — BP 128/74 | HR 79 | Ht 73.0 in | Wt 241.0 lb

## 2016-01-02 DIAGNOSIS — E1165 Type 2 diabetes mellitus with hyperglycemia: Secondary | ICD-10-CM

## 2016-01-02 DIAGNOSIS — E1151 Type 2 diabetes mellitus with diabetic peripheral angiopathy without gangrene: Secondary | ICD-10-CM | POA: Diagnosis not present

## 2016-01-02 DIAGNOSIS — IMO0002 Reserved for concepts with insufficient information to code with codable children: Secondary | ICD-10-CM

## 2016-01-02 LAB — POCT GLYCOSYLATED HEMOGLOBIN (HGB A1C): HEMOGLOBIN A1C: 7.5

## 2016-01-02 MED ORDER — DULAGLUTIDE 1.5 MG/0.5ML ~~LOC~~ SOAJ: SUBCUTANEOUS | 11 refills | Status: DC

## 2016-01-02 MED ORDER — INSULIN ISOPHANE & REGULAR (HUMAN 70-30)100 UNIT/ML KWIKPEN: PEN_INJECTOR | SUBCUTANEOUS | 2 refills | Status: DC

## 2016-01-02 NOTE — Patient Instructions (Addendum)
Please continue: - Metformin-glipizide 500-2.5 mg x2 tablets 2x a day - Humulin 70/30 - inject 30 min before meals: 20 units before b'fast 25 units before dinner  Switch from Tanzeum to Trulicity 1.5 mg weekly.  Please return in 3 months with your sugar log.

## 2016-01-02 NOTE — Progress Notes (Signed)
Patient ID: [name], male   DOB: [birth year], 70 y.o.   MRN: [id number]  HPI: [name] is a 70 y.o.-year-old male, returning for f/u for DM2, dx 1995, insulin-dependent, uncontrolled, with complications (CKD stage 3, CAD - AMI, s/p stent LAD 2001). Last visit 3 mo ago.  He had diverticulitis since last visit, now resolved.  Last hemoglobin A1c was: Lab Results  Component Value Date   HGBA1C 8.8 10/02/2015   HGBA1C 7.7 07/03/2015   HGBA1C 8.0 04/02/2015   He was on: Metformin-glipizide 1000-5 mg 2x a day Glipizide 5 >> 10 mg before dinner (added 03/2015)  Levemir 20 >> 23 >> 28 >> 35 units at bedtime. We stopped Tradjenta 5 mg in am (added 05/2014) - too expensive We stopped Jardiance. Alogliptin was too expensive, and not picked up by [facility]: 300$.  Now on: - Humulin 70/30 - inject 30 min before meals: 15 units before b'fast 25 units before dinner - Metformin-glipizide 500-2.5 mg x2 tablets 2x a day - Tanzeum 50 mg weekly (started 09/2015) - >200$ per month!  Pt checks his sugars 1-2x a day and they are: - am: 120-156, 187 >> 124-183 >> 139-169 >> 154-193 >> 124-194 >> 161-224 >> 145, 159-211 >> 128-187 - 2h after b'fast: n/c >> 131 >> n/c - before lunch: 204 >> 136-165, 213 >> 91-136, 171 >> 161-191 >> n/c >> 198, 215 - 2h after lunch: 214 >> 132 >> n/c >> 179 >> n/c - before dinner: 173-230 >> 146-195 >> 118-159, 231 >> 175 >> 169-257 >> 136-199, 243 - 2h after dinner:108, 183-216 >> 153-230, 279 >> 155, 187 >> n/c >> 214-270 >> 146, 210-255 >> 209 - bedtime: 123-199, 224 >> 170-284, 348 (ate late) >> 119-247 >> 164, 201-287 >> 85, 145-257 >> 127-232 - nighttime: n/c No lows. Lowest sugar was 118 >> 118 >> 140 >> 85; ? if hypoglycemia awareness.  Highest sugar was 250 >> 247 >> 287 >> 257 >> 243.  Pt's meals are: - Breakfast: eggs, toast, ham/sausage or cereals + milk - Lunch: sandwich, vegetables, meat - Dinner: seafood, meat, vegetables - Snacks:  nabs  He exercises 2x a week.  Received labs from PCP: 09/30/2015: - Hep C virus antibody <0.1 - Lipids: 126/68/44/68 - CMP normal, except glucose 209, BUN/creatinine 22/1.39, GFR 51, potassium 5.4 (3.5-5.2), CO2 17 (18-29) - CBC with differential normal - B12 vitamin 305 [id number]  04/01/2015: - ACR 56.3 - CBC with differential normal - Lipids: 116/56/42/63 - CMP: Glucose 193, BUN/creatinine 21/1.23, GFR 60, LFTs normal - PSA 0.5    - + CKD stage 3:  Lab Results  Component Value Date   BUN 14 03/28/2014   CREATININE 1.4 (A) 03/28/2014  On ramipril. - lipids: Lab Results  Component Value Date   CHOL 131 03/28/2014   HDL 47 03/28/2014   LDLCALC 68 03/28/2014   TRIG 82 03/28/2014  On Lipitor 20.  - last eye exam was in 04/22/2015 >> No DR. - no numbness and tingling in his feet. He had a foot exam on 04/02/2015 >> normal.   ROS: Constitutional: no weight gain/loss, no fatigue, no subjective hyperthermia/hypothermia Eyes: no blurry vision, no xerophthalmia ENT: no sore throat, no nodules palpated in throat, no dysphagia/odynophagia, no hoarseness, + decreased hearing Cardiovascular: no CP/SOB/palpitations/leg swelling Respiratory: no cough/SOB Gastrointestinal: no N/V/D/C Musculoskeletal: no muscle/joint aches Skin: no rashes Neurological: no tremors/numbness/tingling/dizziness  I reviewed pt's medications, allergies, PMH, social hx, family hx, and changes were documented in  the history of present illness. Otherwise, unchanged from my initial visit note:  Past Medical History:  Diagnosis Date  . AAA (abdominal aortic aneurysm) ([place])   . Atrial fibrillation [facility]) October 2013   Xarelto started; duration unknown  . Chronic kidney disease    Stage III   . Coronary artery disease    post bare-metal stent to LAD in 2001  . Diabetes mellitus   . Hypertension   . Myocardial infarct ([place]) 2001  . Obesity    Past Surgical History:  Procedure Laterality Date  .  CARDIAC CATHETERIZATION  2006   stable with primarily nonobstructive disease  . CARDIOVERSION  03/27/2012   Procedure: CARDIOVERSION;  Surgeon: Carlena Bjornstad, MD;  Location: [facility] ENDOSCOPY;  Service: Cardiovascular;  Laterality: N/A;  . CORONARY ANGIOPLASTY WITH STENT PLACEMENT  2001   bare-metal stent to the LAD  . HERNIA REPAIR  2006  . SKIN GRAFT  1981   Social History Main Topics  . Smoking status: Former Smoker -- 0.50 packs/day for 40 years    Types: Cigarettes, Pipe, Cigars    Quit date: 05/11/1999  . Smokeless tobacco: Never Used  . Alcohol Use: No  . Drug Use: No   Social History Narrative   Married    Retired: [employer]   3 grown children   Current Outpatient Prescriptions on File Prior to Visit  Medication Sig Dispense Refill  . Albiglutide (TANZEUM) 50 MG PEN Please inject 50 mg weekly under skin. 4 each 5  . atorvastatin (LIPITOR) 20 MG tablet Take 20 mg by mouth 2 (two) times daily.     Marland Kitchen glipiZIDE-metformin (METAGLIP) 2.5-500 MG tablet TAKE TWO TABLETS BY MOUTH TWICE DAILY BEFORE MEAL(S) 360 tablet 0  . Insulin Isophane & Regular Human (HUMULIN 70/30 MIX) (70-30) 100 UNIT/ML PEN Inject under skin 15 units before b'fast and 25 units before dinner 15 mL 2  . Insulin Pen Needle (BD PEN NEEDLE NANO U/F) 32G X 4 MM MISC USE AS DIRECTED 2x  DAILY 200 each 5  . LEVEMIR FLEXTOUCH 100 UNIT/ML Pen     . metoprolol tartrate (LOPRESSOR) 25 MG tablet TAKE 1 TABLET BY MOUTH TWICE A DAY 180 tablet 3  . ramipril (ALTACE) 10 MG capsule Take 10 mg by mouth daily.    Alveda Reasons 20 MG TABS tablet TAKE ONE TABLET BY MOUTH ONCE DAILY 90 tablet 3  . nitroGLYCERIN (NITROSTAT) 0.4 MG SL tablet Place 0.4 mg under the tongue every 5 (five) minutes as needed.    Marland Kitchen omeprazole (PRILOSEC) 20 MG capsule Take 20 mg by mouth daily. Reported on 11/13/2015     No current facility-administered medications on file prior to visit.    Allergies  Allergen Reactions  . Omeprazole Diarrhea   Family History   Problem Relation Age of Onset  . Heart disease Father   . ALS Mother   . Diabetes Sister   . Hypertension Sister   . Heart failure Brother   . Heart attack Father   . Stroke Neg Hx    PE: BP 128/74 (BP Location: Left Arm, Patient Position: Sitting)   Pulse 79   Ht 6\' 1"  (1.854 m)   Wt 241 lb (109.3 kg)   SpO2 96%   BMI 31.80 kg/m  Body mass index is 31.8 kg/m. Wt Readings from Last 3 Encounters:  01/02/16 241 lb (109.3 kg)  11/13/15 245 lb (111.1 kg)  10/02/15 242 lb (109.8 kg)   Constitutional: overweight, in NAD Eyes: PERRLA,  EOMI, no exophthalmos ENT: moist mucous membranes, no thyromegaly, no cervical lymphadenopathy Cardiovascular: RRR, No MRG Respiratory: CTA B Gastrointestinal: abdomen soft, NT, ND, BS+ Musculoskeletal: no deformities, strength intact in all 4 Skin: moist, warm, no rashes Neurological: no tremor with outstretched hands, DTR normal in all 4  ASSESSMENT: 1. DM2, insulin-dependent, uncontrolled, with complications - CAD - CKD stage 3  PLAN:  1. Patient with long-standing, uncontrolled diabetes, on oral antidiabetic regimen + premixed insulin and now on Tanzeum, with improved sugars since last visit. He does not like waiting for Tanzeum to mix >> will switch to Trulicity. He had diverticulitis since last visit >> now resolved.  - his GLP1 R agonist is expensive for him >> he opted to continue for now - I suggested to:  Patient Instructions  Please continue: - Metformin-glipizide 500-2.5 mg x2 tablets 2x a day - Humulin 70/30 - inject 30 min before meals: 20 units before b'fast 25 units before dinner  Switch from Tanzeum to Trulicity 1.5 mg weekly.  Please return in 3 months with your sugar log.   - continue checking sugars at different times of the day - check 2 times a day, rotating checks - advised for yearly eye exams, he is UTD - will check HbA1c today >> 7.5% (better) - Return to clinic in 3 months  Philemon Kingdom, MD PhD [facility]  Endocrinology

## 2016-01-05 ENCOUNTER — Other Ambulatory Visit: Payer: Self-pay | Admitting: Internal Medicine

## 2016-01-12 ENCOUNTER — Other Ambulatory Visit: Payer: Self-pay | Admitting: Internal Medicine

## 2016-02-27 ENCOUNTER — Other Ambulatory Visit: Payer: Self-pay | Admitting: Cardiovascular Disease

## 2016-03-29 ENCOUNTER — Encounter: Payer: Self-pay | Admitting: Internal Medicine

## 2016-03-29 ENCOUNTER — Ambulatory Visit (INDEPENDENT_AMBULATORY_CARE_PROVIDER_SITE_OTHER): Payer: Medicare Other | Admitting: Internal Medicine

## 2016-03-29 VITALS — BP 110/80 | HR 89 | Ht 73.5 in | Wt 246.2 lb

## 2016-03-29 DIAGNOSIS — IMO0002 Reserved for concepts with insufficient information to code with codable children: Secondary | ICD-10-CM

## 2016-03-29 DIAGNOSIS — E1151 Type 2 diabetes mellitus with diabetic peripheral angiopathy without gangrene: Secondary | ICD-10-CM | POA: Diagnosis not present

## 2016-03-29 DIAGNOSIS — E1165 Type 2 diabetes mellitus with hyperglycemia: Secondary | ICD-10-CM | POA: Diagnosis not present

## 2016-03-29 LAB — POCT GLYCOSYLATED HEMOGLOBIN (HGB A1C): HEMOGLOBIN A1C: 6.7

## 2016-03-29 NOTE — Progress Notes (Signed)
Patient ID: NAZAIRE SLAYBAUGH, male   DOB: 06/29/1945, 70 y.o.   MRN: [id number]  HPI: RION SLAYDEN is a 70 y.o.-year-old male, returning for f/u for DM2, dx 1995, insulin-dependent, uncontrolled, with complications (CKD stage 3, CAD - AMI, s/p stent LAD 2001). Last visit 3 mo ago.  Last hemoglobin A1c was: Lab Results  Component Value Date   HGBA1C 7.5 01/02/2016   HGBA1C 8.8 10/02/2015   HGBA1C 7.7 07/03/2015   He was on: Metformin-glipizide 1000-5 mg 2x a day Glipizide 5 >> 10 mg before dinner (added 03/2015)  Levemir 20 >> 23 >> 28 >> 35 units at bedtime. We stopped Tradjenta 5 mg in am (added 05/2014) - too expensive We stopped Jardiance. Alogliptin was too expensive, and not picked up by [facility]: 300$.  Now on: - Humulin 70/30 - inject 30 min before meals: 20 units before b'fast 25 units before dinner - Metformin-glipizide 500-2.5 mg x2 tablets 2x a day - Tanzeum 50 mg weekly (started 09/2015) - >200$ per month! >> Trulicity 1.5 mg weekly  Pt checks his sugars 1-2x a day and they are: - am: 154-193 >> 124-194 >> 161-224 >> 145, 159-211 >> 128-187 >> 96, 121-170, 185 - 2h after b'fast: n/c >> 131 >> n/c - before lunch: 136-165, 213 >> 91-136, 171 >> 161-191 >> n/c >> 198, 215 >> n/c - 2h after lunch: 214 >> 132 >> n/c >> 179 >> n/c - before dinner: 118-159, 231 >> 175 >> 169-257 >> 136-199, 243 >> 103-178, 200, 236 - 2h after dinner:155, 187 >> n/c >> 214-270 >> 146, 210-255 >> 209 >> 203, 220 - bedtime: 119-247 >> 164, 201-287 >> 85, 145-257 >> 127-232 >> 67, 98-158, 196 - nighttime: n/c >> 168 No lows. Lowest sugar was 85; ? if hypoglycemia awareness.  Highest sugar was 243.  Pt's meals are: - Breakfast: eggs, toast, ham/sausage or cereals + milk - Lunch: sandwich, vegetables, meat - Dinner: seafood, meat, vegetables - Snacks: nabs  He exercises 2x a week.  Received labs from PCP: 09/30/2015: - Hep C virus antibody <0.1 - Lipids: 126/68/44/68 - CMP normal,  except glucose 209, BUN/creatinine 22/1.39, GFR 51, potassium 5.4 (3.5-5.2), CO2 17 (18-29) - CBC with differential normal - B12 vitamin 305 [id number])  04/01/2015: - ACR 56.3 - CBC with differential normal - Lipids: 116/56/42/63 - CMP: Glucose 193, BUN/creatinine 21/1.23, GFR 60, LFTs normal - PSA 0.5    - + CKD stage 3:  Lab Results  Component Value Date   BUN 14 03/28/2014   CREATININE 1.4 (A) 03/28/2014  On ramipril. - lipids: Lab Results  Component Value Date   CHOL 131 03/28/2014   HDL 47 03/28/2014   LDLCALC 68 03/28/2014   TRIG 82 03/28/2014  On Lipitor 20.  - last eye exam was in 04/22/2015 >> No DR. - no numbness and tingling in his feet. He had a foot exam on 04/02/2015 >> normal.   He still has persistent diarrhea after his diverticulitis episode this summer. He will see GI later today.  ROS: Constitutional: no weight gain/loss, no fatigue, no subjective hyperthermia/hypothermia Eyes: no blurry vision, no xerophthalmia ENT: no sore throat, no nodules palpated in throat, no dysphagia/odynophagia, no hoarseness, + decreased hearing Cardiovascular: no CP/SOB/palpitations/leg swelling Respiratory: no cough/SOB Gastrointestinal: no N/V/+ D/no C Musculoskeletal: no muscle/joint aches Skin: no rashes Neurological: no tremors/numbness/tingling/dizziness  I reviewed pt's medications, allergies, PMH, social hx, family hx, and changes were documented in the history of present illness. Otherwise,  unchanged from my initial visit note:  Past Medical History:  Diagnosis Date  . AAA (abdominal aortic aneurysm) ([place])   . Atrial fibrillation [facility]) October 2013   Xarelto started; duration unknown  . Chronic kidney disease    Stage III   . Coronary artery disease    post bare-metal stent to LAD in 2001  . Diabetes mellitus   . Hypertension   . Myocardial infarct 2001  . Obesity    Past Surgical History:  Procedure Laterality Date  . CARDIAC CATHETERIZATION  2006    stable with primarily nonobstructive disease  . CARDIOVERSION  03/27/2012   Procedure: CARDIOVERSION;  Surgeon: Carlena Bjornstad, MD;  Location: [facility] ENDOSCOPY;  Service: Cardiovascular;  Laterality: N/A;  . CORONARY ANGIOPLASTY WITH STENT PLACEMENT  2001   bare-metal stent to the LAD  . HERNIA REPAIR  2006  . SKIN GRAFT  1981   Social History Main Topics  . Smoking status: Former Smoker -- 0.50 packs/day for 40 years    Types: Cigarettes, Pipe, Cigars    Quit date: 05/11/1999  . Smokeless tobacco: Never Used  . Alcohol Use: No  . Drug Use: No   Social History Narrative   Married    Retired: [employer]   3 grown children   Current Outpatient Prescriptions on File Prior to Visit  Medication Sig Dispense Refill  . amoxicillin-clavulanate (AUGMENTIN) 875-125 MG tablet     . atorvastatin (LIPITOR) 20 MG tablet Take 20 mg by mouth 2 (two) times daily.     . Dulaglutide (TRULICITY) 1.5 0000000 SOPN Please inject under skin 1.5 mg weekly 4 pen 11  . glipiZIDE-metformin (METAGLIP) 2.5-500 MG tablet TAKE TWO TABLETS BY MOUTH TWICE DAILY BEFORE MEAL(S) 360 tablet 0  . HUMULIN 70/30 KWIKPEN (70-30) 100 UNIT/ML PEN INJECT 15 UNITS SUBCUTANEOUSLY BEFORE BREAKFAST AND 25 UNITS BEFORE DINNER. 15 pen 3  . Insulin Isophane & Regular Human (HUMULIN 70/30 MIX) (70-30) 100 UNIT/ML PEN Inject under skin 20 units before b'fast and 25 units before dinner 15 mL 2  . Insulin Pen Needle (BD PEN NEEDLE NANO U/F) 32G X 4 MM MISC USE AS DIRECTED 2x  DAILY 200 each 5  . metoprolol tartrate (LOPRESSOR) 25 MG tablet TAKE ONE TABLET BY MOUTH TWICE DAILY 180 tablet 2  . nitroGLYCERIN (NITROSTAT) 0.4 MG SL tablet Place 0.4 mg under the tongue every 5 (five) minutes as needed.    Marland Kitchen omeprazole (PRILOSEC) 20 MG capsule Take 20 mg by mouth daily. Reported on 11/13/2015    . ramipril (ALTACE) 10 MG capsule Take 10 mg by mouth daily.    Alveda Reasons 20 MG TABS tablet TAKE ONE TABLET BY MOUTH ONCE DAILY 90 tablet 3   No current  facility-administered medications on file prior to visit.    Allergies  Allergen Reactions  . Omeprazole Diarrhea   Family History  Problem Relation Age of Onset  . Heart disease Father   . ALS Mother   . Diabetes Sister   . Hypertension Sister   . Heart failure Brother   . Heart attack Father   . Stroke Neg Hx    PE: There were no vitals taken for this visit. There is no height or weight on file to calculate BMI. Wt Readings from Last 3 Encounters:  01/02/16 241 lb (109.3 kg)  11/13/15 245 lb (111.1 kg)  10/02/15 242 lb (109.8 kg)   Constitutional: overweight, in NAD Eyes: PERRLA, EOMI, no exophthalmos ENT: moist mucous membranes, no thyromegaly,  no cervical lymphadenopathy Cardiovascular: RRR, No MRG Respiratory: CTA B Gastrointestinal: abdomen soft, NT, ND, BS+ Musculoskeletal: no deformities, strength intact in all 4 Skin: moist, warm, no rashes Neurological: no tremor with outstretched hands, DTR normal in all 4  ASSESSMENT: 1. DM2, insulin-dependent, uncontrolled, with complications - CAD - CKD stage 3  PLAN:  1. Patient with long-standing, uncontrolled diabetes, on oral antidiabetic regimen + premixed insulin and Trulicity (changed from Tanzeum at last visit). Sugars are slightly better. We will probably need to decrease the metformin dose due to his diarrhea, if GI investigation is negative. - GLP1 R agonist is expensive for him >> he opted to continue for now - I suggested to:  Patient Instructions  Please continue: - Humulin 70/30 - inject 30 min before meals: 20 units before b'fast 25 units before dinner - Trulicity 1.5 mg weekly.  Please decrease: - Metformin-glipizide 500-2.5 mg to  1 tablet 2x a day if the GI investigation is negative  Please return in 3 months with your sugar log.   - continue checking sugars at different times of the day - check 2 times a day, rotating checks - advised for yearly eye exams, he is UTD - will check HbA1c today >>  6.7% (better!) - Return to clinic in 3 months  Philemon Kingdom, MD PhD [facility] Endocrinology

## 2016-03-29 NOTE — Patient Instructions (Addendum)
Please continue: - Humulin 70/30 - inject 30 min before meals: 20 units before b'fast 25 units before dinner - Trulicity 1.5 mg weekly.  Please decrease: - Metformin-glipizide 500-2.5 mg to  1 tablet 2x a day if the GI investigation is negative  Please return in 3 months with your sugar log.

## 2016-04-26 LAB — HM DIABETES EYE EXAM

## 2016-04-30 ENCOUNTER — Telehealth: Payer: Self-pay | Admitting: Internal Medicine

## 2016-04-30 NOTE — Telephone Encounter (Signed)
Please call the humulin 70/30 kwik pen in to [facility] pt is there waiting

## 2016-05-04 ENCOUNTER — Other Ambulatory Visit: Payer: Self-pay | Admitting: Endocrinology

## 2016-05-04 MED ORDER — INSULIN ISOPHANE & REGULAR (HUMAN 70-30)100 UNIT/ML KWIKPEN: PEN_INJECTOR | SUBCUTANEOUS | 2 refills | Status: DC

## 2016-05-04 NOTE — Telephone Encounter (Signed)
Message was reviewed by me on 05/04/2016. Refill submitted.

## 2016-06-16 ENCOUNTER — Other Ambulatory Visit: Payer: Self-pay

## 2016-06-16 ENCOUNTER — Other Ambulatory Visit: Payer: Self-pay | Admitting: Cardiovascular Disease

## 2016-06-16 MED ORDER — GLIPIZIDE-METFORMIN HCL 2.5-500 MG PO TABS: ORAL_TABLET | ORAL | 0 refills | Status: DC

## 2016-06-16 MED ORDER — INSULIN ISOPHANE & REGULAR (HUMAN 70-30)100 UNIT/ML KWIKPEN: PEN_INJECTOR | SUBCUTANEOUS | 2 refills | Status: DC

## 2016-06-16 MED ORDER — METOPROLOL TARTRATE 25 MG PO TABS: 25.0000 mg | ORAL_TABLET | Freq: Two times a day (BID) | ORAL | 0 refills | Status: DC

## 2016-06-16 MED ORDER — DULAGLUTIDE 1.5 MG/0.5ML ~~LOC~~ SOAJ: SUBCUTANEOUS | 11 refills | Status: DC

## 2016-06-16 NOTE — Telephone Encounter (Signed)
[facility] pharmacy requesting a refill on Xarelto. Please advise

## 2016-06-17 ENCOUNTER — Ambulatory Visit
Admission: RE | Admit: 2016-06-17 | Discharge: 2016-06-17 | Disposition: A | Discharge status: Home / Self Care | Payer: Medicare Other | Source: Ambulatory Visit | Attending: Family Medicine | Admitting: Family Medicine

## 2016-06-17 ENCOUNTER — Other Ambulatory Visit: Payer: Self-pay | Admitting: Family Medicine

## 2016-06-17 DIAGNOSIS — Z1212 Encounter for screening for malignant neoplasm of rectum: Secondary | ICD-10-CM | POA: Diagnosis not present

## 2016-06-17 DIAGNOSIS — R1032 Left lower quadrant pain: Secondary | ICD-10-CM

## 2016-06-17 DIAGNOSIS — K5730 Diverticulosis of large intestine without perforation or abscess without bleeding: Secondary | ICD-10-CM | POA: Diagnosis not present

## 2016-06-17 MED ORDER — IOPAMIDOL (ISOVUE-300) INJECTION 61%
100.0000 mL | Freq: Once | INTRAVENOUS | Status: AC | PRN
Start: 2016-06-17 — End: 2016-06-17
  Administered 2016-06-17: 100 mL via INTRAVENOUS

## 2016-06-17 MED ORDER — RIVAROXABAN 20 MG PO TABS: 20.0000 mg | ORAL_TABLET | Freq: Every day | ORAL | 1 refills | Status: DC

## 2016-06-17 NOTE — Telephone Encounter (Signed)
CrCl > 50 on last BMET from 2017. Ok to continue Xarelto 20mg  daily dose.

## 2016-06-29 ENCOUNTER — Ambulatory Visit (INDEPENDENT_AMBULATORY_CARE_PROVIDER_SITE_OTHER): Payer: Medicare Other | Admitting: Internal Medicine

## 2016-06-29 ENCOUNTER — Encounter: Payer: Self-pay | Admitting: Internal Medicine

## 2016-06-29 VITALS — BP 118/84 | HR 76 | Ht 72.0 in | Wt 241.0 lb

## 2016-06-29 DIAGNOSIS — E1151 Type 2 diabetes mellitus with diabetic peripheral angiopathy without gangrene: Secondary | ICD-10-CM | POA: Diagnosis not present

## 2016-06-29 DIAGNOSIS — E1165 Type 2 diabetes mellitus with hyperglycemia: Secondary | ICD-10-CM

## 2016-06-29 DIAGNOSIS — IMO0002 Reserved for concepts with insufficient information to code with codable children: Secondary | ICD-10-CM

## 2016-06-29 LAB — POCT GLYCOSYLATED HEMOGLOBIN (HGB A1C): Hemoglobin A1C: 6.6

## 2016-06-29 MED ORDER — INSULIN PEN NEEDLE 32G X 4 MM MISC: 3 refills | Status: DC

## 2016-06-29 MED ORDER — INSULIN ISOPHANE & REGULAR (HUMAN 70-30)100 UNIT/ML KWIKPEN: PEN_INJECTOR | SUBCUTANEOUS | 3 refills | Status: DC

## 2016-06-29 NOTE — Patient Instructions (Addendum)
Please continue: - Humulin 70/30 - inject 30 min before meals: 20 units before a smaller meal 25 units before a larger meal (try to use this more with b'fast) - Trulicity 1.5 mg weekly. - Metformin-glipizide 500-2.5 mg x a day  Please return in 3 months with your sugar log.

## 2016-06-29 NOTE — Addendum Note (Signed)
Addended by: Caprice Beaver T on: 06/29/2016 11:08 AM   Modules accepted: Orders

## 2016-06-29 NOTE — Progress Notes (Signed)
Patient ID: [name], male   DOB: 1945-07-10, 71 y.o.   MRN: [id number]  HPI: [name] is a 71 y.o.-year-old male, returning for f/u for DM2, dx 1995, insulin-dependent, uncontrolled, with complications (CKD stage 3, CAD - AMI, s/p stent LAD 2001). Last visit 3 mo ago.  At last visit, we decreased his metformin dose b/c diarrhea. He was dx'ed with diverticulitis recently >> finished 2 courses of ABx. Diarrhea is much better.  Last hemoglobin A1c was: Lab Results  Component Value Date   HGBA1C 6.7 03/29/2016   HGBA1C 7.5 01/02/2016   HGBA1C 8.8 10/02/2015   He is on on: - Humulin 70/30 - inject 30 min before meals: 20 units before b'fast 25 units before dinner - Metformin-glipizide 500-2.5 mg 1 tablet 2x a day (we decreased the dose 03/2016 b/c diarrhea) - Tanzeum 50 mg weekly (started 09/2015) - >200$ per month! >> Trulicity 1.5 mg weekly  Pt checks his sugars 1-2x a day and they are: - am: 145, 159-211 >> 128-187 >> 96, 121-170, 185 >> 124-168, 170 - 2h after b'fast: n/c >> 131 >> n/c - before lunch: 136-165, 213 >> 91-136, 171 >> 161-191 >> n/c >> 198, 215 >> n/c - 2h after lunch: 214 >> 132 >> n/c >> 179 >> n/c - before dinner: 175 >> 169-257 >> 136-199, 243 >> 103-178, 200, 236 >> 97-183, 209, 319! (with his diverticulitis episode) - 2h after dinner:155, 187 >> n/c >> 214-270 >> 146, 210-255 >> 209 >> 203, 220 >> n/c - bedtime: 119-247 >> 164, 201-287 >> 85, 145-257 >> 127-232 >> 67, 98-158, 196 >> 69 x1, 107-141, 222 - nighttime: n/c >> 168 Lowest sugar was 85 >> 69; No hypoglycemia awareness.  Highest sugar was 243 >> 319.  Pt's meals are: - Breakfast: eggs, toast, ham/sausage or cereals + milk - Lunch: sandwich, vegetables, meat - Dinner: seafood, meat, vegetables - Snacks: nabs He exercises 2x a week.  Received labs from PCP: 09/30/2015: - Hep C virus antibody <0.1 - Lipids: 126/68/44/68 - CMP normal, except glucose 209, BUN/creatinine 22/1.39, GFR 51,  potassium 5.4 (3.5-5.2), CO2 17 (18-29) - CBC with differential normal - B12 vitamin 305 [id number])  04/01/2015: - ACR 56.3 - CBC with differential normal - Lipids: 116/56/42/63 - CMP: Glucose 193, BUN/creatinine 21/1.23, GFR 60, LFTs normal - PSA 0.5    - + CKD stage 3:  Lab Results  Component Value Date   BUN 14 03/28/2014   CREATININE 1.4 (A) 03/28/2014  On ramipril. - lipids: Lab Results  Component Value Date   CHOL 131 03/28/2014   HDL 47 03/28/2014   LDLCALC 68 03/28/2014   TRIG 82 03/28/2014  On Lipitor 20.  - last eye exam was in 04/22/2015 >> No DR. - no numbness and tingling in his feet. He had a foot exam on 04/02/2015 >> normal.   He still has persistent diarrhea after his diverticulitis episode this summer. He will see GI later today.  ROS: Constitutional: no weight gain/loss, no fatigue, no subjective hyperthermia/hypothermia Eyes: no blurry vision, no xerophthalmia ENT: no sore throat, no nodules palpated in throat, no dysphagia/odynophagia, no hoarseness, + decreased hearing Cardiovascular: no CP/SOB/palpitations/leg swelling Respiratory: no cough/SOB Gastrointestinal: no N/V/+ D/no C Musculoskeletal: no muscle/joint aches Skin: no rashes Neurological: no tremors/numbness/tingling/dizziness  I reviewed pt's medications, allergies, PMH, social hx, family hx, and changes were documented in the history of present illness. Otherwise, unchanged from my initial visit note:  Past Medical History:  Diagnosis  Date  . AAA (abdominal aortic aneurysm) ([place])   . Atrial fibrillation [facility]) October 2013   Xarelto started; duration unknown  . Chronic kidney disease    Stage III   . Coronary artery disease    post bare-metal stent to LAD in 2001  . Diabetes mellitus   . Hypertension   . Myocardial infarct 2001  . Obesity    Past Surgical History:  Procedure Laterality Date  . CARDIAC CATHETERIZATION  2006   stable with primarily nonobstructive disease  .  CARDIOVERSION  03/27/2012   Procedure: CARDIOVERSION;  Surgeon: Carlena Bjornstad, MD;  Location: [facility] ENDOSCOPY;  Service: Cardiovascular;  Laterality: N/A;  . CORONARY ANGIOPLASTY WITH STENT PLACEMENT  2001   bare-metal stent to the LAD  . HERNIA REPAIR  2006  . SKIN GRAFT  1981   Social History Main Topics  . Smoking status: Former Smoker -- 0.50 packs/day for 40 years    Types: Cigarettes, Pipe, Cigars    Quit date: 05/11/1999  . Smokeless tobacco: Never Used  . Alcohol Use: No  . Drug Use: No   Social History Narrative   Married    Retired: [employer]   3 grown children   Current Outpatient Prescriptions on File Prior to Visit  Medication Sig Dispense Refill  . atorvastatin (LIPITOR) 20 MG tablet Take 20 mg by mouth 2 (two) times daily.     . Dulaglutide (TRULICITY) 1.5 0000000 SOPN Please inject under skin 1.5 mg weekly 4 pen 11  . glipiZIDE-metformin (METAGLIP) 2.5-500 MG tablet TAKE TWO TABLETS BY MOUTH TWICE DAILY BEFORE  MEALS 360 tablet 0  . Insulin Isophane & Regular Human (HUMULIN 70/30 MIX) (70-30) 100 UNIT/ML PEN Inject under skin 20 units before b'fast and 25 units before dinner 15 mL 2  . Insulin Pen Needle (BD PEN NEEDLE NANO U/F) 32G X 4 MM MISC USE AS DIRECTED 2x  DAILY 200 each 5  . metoprolol tartrate (LOPRESSOR) 25 MG tablet Take 1 tablet (25 mg total) by mouth 2 (two) times daily. 180 tablet 0  . nitroGLYCERIN (NITROSTAT) 0.4 MG SL tablet Place 0.4 mg under the tongue every 5 (five) minutes as needed.    Marland Kitchen omeprazole (PRILOSEC) 20 MG capsule Take 20 mg by mouth daily. Reported on 11/13/2015    . ramipril (ALTACE) 10 MG capsule Take 10 mg by mouth daily.    . rivaroxaban (XARELTO) 20 MG TABS tablet Take 1 tablet (20 mg total) by mouth daily. 90 tablet 1  . saccharomyces boulardii (FLORASTOR) 250 MG capsule Take 250 mg by mouth 2 (two) times daily.     No current facility-administered medications on file prior to visit.    Allergies  Allergen Reactions  . Omeprazole  Diarrhea   Family History  Problem Relation Age of Onset  . Heart disease Father   . ALS Mother   . Diabetes Sister   . Hypertension Sister   . Heart failure Brother   . Heart attack Father   . Stroke Neg Hx    PE: BP 118/84 (BP Location: Left Arm, Patient Position: Sitting)   Pulse 76   Ht 6' (1.829 m)   Wt 241 lb (109.3 kg)   SpO2 96%   BMI 32.69 kg/m  Body mass index is 32.69 kg/m. Wt Readings from Last 3 Encounters:  06/29/16 241 lb (109.3 kg)  03/29/16 246 lb 3.2 oz (111.7 kg)  01/02/16 241 lb (109.3 kg)   Constitutional: overweight, in NAD Eyes: PERRLA, EOMI,  no exophthalmos ENT: moist mucous membranes, no thyromegaly, no cervical lymphadenopathy Cardiovascular: RRR, No MRG Respiratory: CTA B Gastrointestinal: abdomen soft, NT, ND, BS+ Musculoskeletal: no deformities, strength intact in all 4 Skin: moist, warm, no rashes Neurological: no tremor with outstretched hands, DTR normal in all 4  ASSESSMENT: 1. DM2, insulin-dependent, uncontrolled, with complications - CAD - CKD stage 3  PLAN:  1. Patient with long-standing, uncontrolled diabetes, on oral antidiabetic regimen + premixed insulin and Trulicity. Sugars are worse before dinner >> advised him to use the higher ins dose in am. We will continue the lower metformin dose for now. - GLP1 R agonist is expensive for him >> hwill probably need to stop at next visit. - I suggested to:  Patient Instructions  Please continue: - Humulin 70/30 - inject 30 min before meals: 20 units before a smaller meal 25 units before a larger meal (try to use this more with b'fast) - Trulicity 1.5 mg weekly. - Metformin-glipizide 500-2.5 mg x a day  Please return in 3 months with your sugar log.   - continue checking sugars at different times of the day - check 2 times a day, rotating checks - advised for yearly eye exams, he needs one - will check HbA1c today >> 6.6% (great!) - Return to clinic in 3 months  Philemon Kingdom, MD PhD [facility] Endocrinology

## 2016-07-09 ENCOUNTER — Other Ambulatory Visit: Payer: Self-pay | Admitting: Gastroenterology

## 2016-07-09 DIAGNOSIS — K5732 Diverticulitis of large intestine without perforation or abscess without bleeding: Secondary | ICD-10-CM | POA: Diagnosis not present

## 2016-07-09 DIAGNOSIS — D509 Iron deficiency anemia, unspecified: Secondary | ICD-10-CM | POA: Diagnosis not present

## 2016-07-09 DIAGNOSIS — Z86010 Personal history of colonic polyps: Secondary | ICD-10-CM

## 2016-07-09 DIAGNOSIS — K219 Gastro-esophageal reflux disease without esophagitis: Secondary | ICD-10-CM | POA: Diagnosis not present

## 2016-08-17 ENCOUNTER — Ambulatory Visit
Admission: RE | Admit: 2016-08-17 | Discharge: 2016-08-17 | Disposition: A | Discharge status: Home / Self Care | Payer: Medicare Other | Source: Ambulatory Visit | Attending: Gastroenterology | Admitting: Gastroenterology

## 2016-08-17 DIAGNOSIS — K5730 Diverticulosis of large intestine without perforation or abscess without bleeding: Secondary | ICD-10-CM | POA: Diagnosis not present

## 2016-08-17 DIAGNOSIS — Z86010 Personal history of colonic polyps: Secondary | ICD-10-CM

## 2016-08-18 ENCOUNTER — Other Ambulatory Visit: Payer: Self-pay | Admitting: Internal Medicine

## 2016-08-23 DIAGNOSIS — K5730 Diverticulosis of large intestine without perforation or abscess without bleeding: Secondary | ICD-10-CM | POA: Diagnosis not present

## 2016-08-23 DIAGNOSIS — R933 Abnormal findings on diagnostic imaging of other parts of digestive tract: Secondary | ICD-10-CM | POA: Diagnosis not present

## 2016-08-31 ENCOUNTER — Encounter: Payer: Self-pay | Admitting: Family

## 2016-09-07 ENCOUNTER — Ambulatory Visit (HOSPITAL_COMMUNITY)
Admission: RE | Admit: 2016-09-07 | Discharge: 2016-09-07 | Disposition: A | Discharge status: Home / Self Care | Payer: Medicare Other | Source: Ambulatory Visit | Attending: Family | Admitting: Family

## 2016-09-07 ENCOUNTER — Ambulatory Visit (INDEPENDENT_AMBULATORY_CARE_PROVIDER_SITE_OTHER): Payer: Medicare Other | Admitting: Family

## 2016-09-07 ENCOUNTER — Encounter: Payer: Self-pay | Admitting: Family

## 2016-09-07 VITALS — BP 133/68 | HR 90 | Temp 97.0°F | Resp 18 | Ht 74.0 in | Wt 235.0 lb

## 2016-09-07 DIAGNOSIS — R0989 Other specified symptoms and signs involving the circulatory and respiratory systems: Secondary | ICD-10-CM | POA: Diagnosis not present

## 2016-09-07 DIAGNOSIS — I723 Aneurysm of iliac artery: Secondary | ICD-10-CM | POA: Diagnosis not present

## 2016-09-07 DIAGNOSIS — I714 Abdominal aortic aneurysm, without rupture, unspecified: Secondary | ICD-10-CM

## 2016-09-07 DIAGNOSIS — Z87891 Personal history of nicotine dependence: Secondary | ICD-10-CM | POA: Diagnosis not present

## 2016-09-07 NOTE — Patient Instructions (Addendum)
Abdominal Aortic Aneurysm Blood pumps away from the heart through tubes (blood vessels) called arteries. Aneurysms are weak or damaged places in the wall of an artery. It bulges out like a balloon. An abdominal aortic aneurysm happens in the main artery of the body (aorta). It can burst or tear, causing bleeding inside the body. This is an emergency. It needs treatment right away. What are the causes? The exact cause is unknown. Things that could cause this problem include:  Fat and other substances building up in the lining of a tube.  Swelling of the walls of a blood vessel.  Certain tissue diseases.  Belly (abdominal) trauma.  An infection in the main artery of the body. What increases the risk? There are things that make it more likely for you to have an aneurysm. These include:  Being over the age of 71 years old.  Having high blood pressure (hypertension).  Being a male.  Being white.  Being very overweight (obese).  Having a family history of aneurysm.  Using tobacco products. What are the signs or symptoms? Symptoms depend on the size of the aneurysm and how fast it grows. There may not be symptoms. If symptoms occur, they can include:  Pain (belly, side, lower back, or groin).  Feeling full after eating a small amount of food.  Feeling sick to your stomach (nauseous), throwing up (vomiting), or both.  Feeling a lump in your belly that feels like it is beating (pulsating).  Feeling like you will pass out (faint). How is this treated?  Medicine to control blood pressure and pain.  Imaging tests to see if the aneurysm gets bigger.  Surgery. How is this prevented? To lessen your chance of getting this condition:  Stop smoking. Stop chewing tobacco.  Limit or avoid alcohol.  Keep your blood pressure, blood sugar, and cholesterol within normal limits.  Eat less salt.  Eat foods low in saturated fats and cholesterol. These are found in animal and whole  dairy products.  Eat more fiber. Fiber is found in whole grains, vegetables, and fruits.  Keep a healthy weight.  Stay active and exercise often. This information is not intended to replace advice given to you by your health care provider. Make sure you discuss any questions you have with your health care provider. Document Released: 08/21/2012 Document Revised: 10/02/2015 Document Reviewed: 05/26/2012 Elsevier Interactive Patient Education  2017 Elsevier Inc.      Before your next abdominal ultrasound:  Take two Extra-Strength Gas-X capsules at bedtime the night before the test. Take another two Extra-Strength Gas-X capsules 3 hours before the test.   

## 2016-09-07 NOTE — Progress Notes (Signed)
[facility] [place]   CC: Follow up Abdominal Aortic Aneurysm  History of Present Illness  [name] is a 71 y.o. (May 09, 1946) male patient of Dr. Donnetta Hutching who underwent a CT scan for workup of hematuria. He had an incidental finding of a 3.6 cm infrarenal abdominal aortic aneurysm. There is also some ectasia of the right common iliac artery. He had no known knowledge of this aneurysm. He has no symptoms of the aneurysm. He does have a history of prior myocardial infarction 2001 and had a bare-metal stent placed that time.  The patient does not have acute back or abdominal pain.   He reports that he has diverticulitis, sees a gastroenterologist.   The patient denies claudication in his legs with walking. He denies any history of stroke or TIA symptoms.   He has degenerative disc disease in his back, but this has improved with physical therapy and exercise.  He takes Xarelto for atrial fib., Dr. Julianne Handler is his cardiologist.   Pt Diabetic: Yes, review of records indicates his last A1C was 6.6 (06-29-16), seeing an endocrinologist, Dr. Monna Fam Pt smoker: former smoker, quit 2001   Past Medical History:  Diagnosis Date  . AAA (abdominal aortic aneurysm) ([place])   . Atrial fibrillation [facility]) October 2013   Xarelto started; duration unknown  . Chronic kidney disease    Stage III   . Coronary artery disease    post bare-metal stent to LAD in 2001  . Diabetes mellitus   . Hypertension   . Myocardial infarct 2001  . Obesity    Past Surgical History:  Procedure Laterality Date  . CARDIAC CATHETERIZATION  2006   stable with primarily nonobstructive disease  . CARDIOVERSION  03/27/2012   Procedure: CARDIOVERSION;  Surgeon: Carlena Bjornstad, MD;  Location: [facility] ENDOSCOPY;  Service: Cardiovascular;  Laterality: N/A;  . CORONARY ANGIOPLASTY WITH STENT PLACEMENT  2001   bare-metal stent to the LAD  . HERNIA REPAIR  2006  . SKIN GRAFT  1981   Social History Social  History   Social History  . Marital status: Married    Spouse name: N/A  . Number of children: N/A  . Years of education: N/A   Occupational History  . Not on file.   Social History Main Topics  . Smoking status: Former Smoker    Packs/day: 0.50    Years: 40.00    Types: Cigarettes, Pipe, Cigars    Quit date: 05/11/1999  . Smokeless tobacco: Never Used  . Alcohol use No  . Drug use: No  . Sexual activity: Yes   Other Topics Concern  . Not on file   Social History Narrative   Married    Retired: [employer]   3 grown children      Family History Family History  Problem Relation Age of Onset  . Heart disease Father   . ALS Mother   . Diabetes Sister   . Hypertension Sister   . Heart failure Brother   . Heart attack Father   . Stroke Neg Hx     Current Outpatient Prescriptions on File Prior to Visit  Medication Sig Dispense Refill  . atorvastatin (LIPITOR) 20 MG tablet Take 20 mg by mouth 2 (two) times daily.     . Dulaglutide (TRULICITY) 1.5 KX/3.8HW SOPN Please inject under skin 1.5 mg weekly 4 pen 11  . glipiZIDE-metformin (METAGLIP) 2.5-500 MG tablet TAKE TWO TABLETS BY MOUTH TWICE DAILY BEFORE  MEALS 360 tablet 0  .  HUMULIN 70/30 KWIKPEN (70-30) 100 UNIT/ML PEN INJECT SUBCUTANEOUSLY 20  UNITS BEFORE BREAKFAST AND  25 UNITS BEFORE DINNER 45 mL 1  . Insulin Isophane & Regular Human (HUMULIN 70/30 MIX) (70-30) 100 UNIT/ML PEN Inject under skin 20 units before b'fast and 25 units before dinner 45 mL 3  . Insulin Pen Needle (RELION PEN NEEDLES) 32G X 4 MM MISC Use 2x a day as advised 200 each 3  . metoprolol tartrate (LOPRESSOR) 25 MG tablet Take 1 tablet (25 mg total) by mouth 2 (two) times daily. 180 tablet 0  . nitroGLYCERIN (NITROSTAT) 0.4 MG SL tablet Place 0.4 mg under the tongue every 5 (five) minutes as needed.    Marland Kitchen omeprazole (PRILOSEC) 20 MG capsule Take 20 mg by mouth daily. Reported on 11/13/2015    . ramipril (ALTACE) 10 MG capsule Take 10 mg by mouth daily.    .  rivaroxaban (XARELTO) 20 MG TABS tablet Take 1 tablet (20 mg total) by mouth daily. 90 tablet 1  . saccharomyces boulardii (FLORASTOR) 250 MG capsule Take 250 mg by mouth 2 (two) times daily.     No current facility-administered medications on file prior to visit.    Allergies  Allergen Reactions  . Omeprazole Diarrhea    ROS: See HPI for pertinent positives and negatives.  Physical Examination  Vitals:   09/07/16 0916  BP: 133/68  Pulse: 90  Resp: 18  Temp: 97 F (36.1 C)  TempSrc: Oral  SpO2: 97%  Weight: 235 lb (106.6 kg)  Height: 6\' 2"  (1.88 m)   Body mass index is 30.17 kg/m.  General: A&O x 3, WD, obese male.  Pulmonary: Sym exp, respirations are non labored, good air movt, CTAB, no rales, rhonchi, & wheezing.  Cardiac: Irregular rhythm, controlled rate    Carotid Bruits Right Left   Negative Negative    Aorta is not palpable Radial pulses are 2+ palpable bilaterally                          VASCULAR EXAM:                                                                                                         LE Pulses Right Left       FEMORAL   palpable   palpable        POPLITEAL  3+ palpable   3+ palpable       POSTERIOR TIBIAL  not palpable   not palpable        DORSALIS PEDIS      ANTERIOR TIBIAL 2+ palpable  2+ palpable      Gastrointestinal:  soft, NTND, -G/R, - HSM, - palpable masses, - CVAT B.  Musculoskeletal: M/S 5/5 throughout, Extremities without ischemic changes, no peripheral edema.  Neurologic: CN 2-12 intact except he is moderately hard of hearing, Pain and light touch intact in extremities, Motor exam as listed above    Non-Invasive Vascular Imaging  AAA Duplex (09/07/2016)  Previous size: 3.87 cm (Date: 08-14-15),  Right CIA: 1.81 cm; Left CIA: 1.57 cm  Current size:  3.93 cm (Date: 09-07-16), Right CIA: 1.97 cm; Left CIA: not visualized  Medical Decision Making  The patient is a 71 y.o. male who presents with asymptomatic  AAA with no significant increase in size, largest diameter today is 3.93 cm. 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 Left CIA was 1.57 cm on 08-14-15.  Prominent bilateral popliteal pulses, no claudication symptoms, see Plan.    Based on this patient's exam and diagnostic studies, the patient will follow up in 1 year  with the following studies: AAA duplex, bilateral popliteal artery duplex.  Consideration for repair of AAA would be made when the size is 5.0 cm, growth > 1 cm/yr, and symptomatic status.       Consideration for repair of iliac artery aneurysm would be made when the size is 3 cm or greater.   I emphasized the importance of maximal medical management including strict control of blood pressure, blood glucose, and lipid levels, antiplatelet agents, obtaining regular exercise, and continued cessation of smoking.   The patient was given information about AAA including signs, symptoms, treatment, and how to minimize the risk of enlargement and rupture of aneurysms.    The patient was advised to call 911 should the patient experience sudden onset abdominal or back pain.   Thank you for allowing [place] to participate in this patient's care.  Clemon Chambers, RN, MSN, FNP-C [facility] [place] Office: [facility] Physician: Early  09/07/2016, 9:27 AM

## 2016-09-08 NOTE — Addendum Note (Signed)
Addended by: Lianne Cure A on: 09/08/2016 12:34 PM   Modules accepted: Orders

## 2016-09-09 DIAGNOSIS — D509 Iron deficiency anemia, unspecified: Secondary | ICD-10-CM | POA: Diagnosis not present

## 2016-09-09 DIAGNOSIS — K5792 Diverticulitis of intestine, part unspecified, without perforation or abscess without bleeding: Secondary | ICD-10-CM | POA: Diagnosis not present

## 2016-09-09 DIAGNOSIS — Z86010 Personal history of colonic polyps: Secondary | ICD-10-CM | POA: Diagnosis not present

## 2016-09-27 ENCOUNTER — Encounter: Payer: Self-pay | Admitting: Internal Medicine

## 2016-09-27 ENCOUNTER — Ambulatory Visit (INDEPENDENT_AMBULATORY_CARE_PROVIDER_SITE_OTHER): Payer: Medicare Other | Admitting: Internal Medicine

## 2016-09-27 VITALS — BP 128/80 | HR 83 | Ht 73.0 in | Wt 239.0 lb

## 2016-09-27 DIAGNOSIS — E1165 Type 2 diabetes mellitus with hyperglycemia: Secondary | ICD-10-CM | POA: Diagnosis not present

## 2016-09-27 DIAGNOSIS — IMO0002 Reserved for concepts with insufficient information to code with codable children: Secondary | ICD-10-CM

## 2016-09-27 DIAGNOSIS — E1151 Type 2 diabetes mellitus with diabetic peripheral angiopathy without gangrene: Secondary | ICD-10-CM | POA: Diagnosis not present

## 2016-09-27 LAB — POCT GLYCOSYLATED HEMOGLOBIN (HGB A1C): HEMOGLOBIN A1C: 6.9

## 2016-09-27 MED ORDER — INSULIN ISOPHANE & REGULAR (HUMAN 70-30)100 UNIT/ML KWIKPEN: PEN_INJECTOR | SUBCUTANEOUS | 3 refills | Status: DC

## 2016-09-27 NOTE — Patient Instructions (Signed)
Please continue: - Metformin-glipizide 500-2.5 mg x a day (stop for 5 days to see if diarrhea is better)  Please increase: - 70/30 insulin to 25 units before b'fast and 30 units before dinner  Stay off Trulicity for now, after you finish your stock.  Please return in 3 months with your sugar log.

## 2016-09-27 NOTE — Progress Notes (Signed)
Patient ID: [name], male   DOB: 1945/10/10, 71 y.o.   MRN: [id number]  HPI: [name] is a 71 y.o.-year-old male, returning for f/u for DM2, dx 1995, insulin-dependent, uncontrolled, with complications (CKD stage 3, CAD - AMI, s/p stent LAD 2001). Last visit 3 mo ago. He is here with his wife who offers part of the hx.  He still has some episodes of diarrhea (sees Dr. Moshe Wenger Gong) >> finished ABx courses   Last hemoglobin A1c was: Lab Results  Component Value Date   HGBA1C 6.6 06/29/2016   HGBA1C 6.7 03/29/2016   HGBA1C 7.5 01/02/2016   He is on on: - Humulin 70/30 - inject 30 min before meals: 20 units before b'fast 25 units before dinner - Metformin-glipizide 500-2.5 mg 1 tablet 2x a day (we decreased the dose 03/2016 b/c diarrhea) - Tanzeum 50 mg weekly (started 09/2015) - >200$ per month! >> Trulicity 1.5 mg weekly >> this is too expensive!  Pt checks his sugars 1-2x a day and they are slightly higher: - am: 128-187 >> 96, 121-170, 185 >> 124-168, 170 >> 129-167, 184 - 2h after b'fast: n/c >> 131 >> n/c - before lunch: 161-191 >> n/c >> 198, 215 >> n/c - 2h after lunch: 214 >> 132 >> n/c >> 179 >> n/c - before dinner:  103-178, 200, 236 >> 97-183, 209, 319! >> 137-218, 240 - 2h after dinner:146, 210-255 >> 209 >> 203, 220 >> n/c - bedtime: 67, 98-158, 196 >> 69 x1, 107-141, 222 >> 88, 113-192, 240 - nighttime: n/c >> 168 Lowest sugar was 85 >> 69; No hypoglycemia awareness.  Highest sugar was 243 >> 319.  Pt's meals are: - Breakfast: eggs, toast, ham/sausage or cereals + milk - Lunch: sandwich, vegetables, meat - Dinner: seafood, meat, vegetables - Snacks: nabs He exercises 2x a week.  Reviewed labs from PCP: 09/30/2015: - Hep C virus antibody <0.1 - Lipids: 126/68/44/68 - CMP normal, except glucose 209, BUN/creatinine 22/1.39, GFR 51, potassium 5.4 (3.5-5.2), CO2 17 (18-29) - CBC with differential normal - B12 vitamin 305 (58-850)  04/01/2015: - ACR  56.3 - CBC with differential normal - Lipids: 116/56/42/63 - CMP: Glucose 193, BUN/creatinine 21/1.23, GFR 60, LFTs normal - PSA 0.5    - + CKD stage 3:  Lab Results  Component Value Date   BUN 14 03/28/2014   CREATININE 1.4 (A) 03/28/2014  On Ramipril. - lipids: Lab Results  Component Value Date   CHOL 131 03/28/2014   HDL 47 03/28/2014   LDLCALC 68 03/28/2014   TRIG 82 03/28/2014  On Lipitor.  - last eye exam was in 04/2016 >> No DR. - no numbness and tingling in his feet. He had a foot exam on 04/02/2015 >> normal.   He still has persistent diarrhea after his diverticulitis episode summer 2017. Sees GI.  ROS: Constitutional: no weight gain/no weight loss, no fatigue, no subjective hyperthermia, no subjective hypothermia Eyes: no blurry vision, no xerophthalmia ENT: no sore throat, no nodules palpated in throat, no dysphagia, no odynophagia, no hoarseness, + hypoacusis Cardiovascular: no CP/no SOB/no palpitations/no leg swelling Respiratory: no cough/no SOB/no wheezing Gastrointestinal: no N/no V/no D/no C/no acid reflux Musculoskeletal: no muscle aches/no joint aches Skin: no rashes, no hair loss Neurological: no tremors/no numbness/no tingling/no dizziness  I reviewed pt's medications, allergies, PMH, social hx, family hx, and changes were documented in the history of present illness. Otherwise, unchanged from my initial visit note.  Past Medical History:  Diagnosis Date  .  AAA (abdominal aortic aneurysm) ([place])   . Atrial fibrillation [facility]) October 2013   Xarelto started; duration unknown  . Chronic kidney disease    Stage III   . Coronary artery disease    post bare-metal stent to LAD in 2001  . Diabetes mellitus   . Hypertension   . Myocardial infarct ([place]) 2001  . Obesity    Past Surgical History:  Procedure Laterality Date  . CARDIAC CATHETERIZATION  2006   stable with primarily nonobstructive disease  . CARDIOVERSION  03/27/2012   Procedure:  CARDIOVERSION;  Surgeon: Carlena Bjornstad, MD;  Location[facility] ENDOSCOPY;  Service: Cardiovascular;  Laterality: N/A;  . CORONARY ANGIOPLASTY WITH STENT PLACEMENT  2001   bare-metal stent to the LAD  . HERNIA REPAIR  2006  . SKIN GRAFT  1981   Social History Main Topics  . Smoking status: Former Smoker -- 0.50 packs/day for 40 years    Types: Cigarettes, Pipe, Cigars    Quit date: 05/11/1999  . Smokeless tobacco: Never Used  . Alcohol Use: No  . Drug Use: No   Social History Narrative   Married    Retired: [employer]   3 grown children   Current Outpatient Prescriptions on File Prior to Visit  Medication Sig Dispense Refill  . atorvastatin (LIPITOR) 20 MG tablet Take 20 mg by mouth daily.     . Dulaglutide (TRULICITY) 1.5 KN/3.9JQ SOPN Please inject under skin 1.5 mg weekly 4 pen 11  . glipiZIDE-metformin (METAGLIP) 2.5-500 MG tablet TAKE TWO TABLETS BY MOUTH TWICE DAILY BEFORE  MEALS 360 tablet 0  . Insulin Isophane & Regular Human (HUMULIN 70/30 MIX) (70-30) 100 UNIT/ML PEN Inject under skin 20 units before b'fast and 25 units before dinner 45 mL 3  . Insulin Pen Needle (RELION PEN NEEDLES) 32G X 4 MM MISC Use 2x a day as advised 200 each 3  . metoprolol tartrate (LOPRESSOR) 25 MG tablet Take 1 tablet (25 mg total) by mouth 2 (two) times daily. 180 tablet 0  . nitroGLYCERIN (NITROSTAT) 0.4 MG SL tablet Place 0.4 mg under the tongue every 5 (five) minutes as needed.    Marland Kitchen omeprazole (PRILOSEC) 20 MG capsule Take 20 mg by mouth daily. Reported on 11/13/2015    . ramipril (ALTACE) 10 MG capsule Take 10 mg by mouth daily.    . rivaroxaban (XARELTO) 20 MG TABS tablet Take 1 tablet (20 mg total) by mouth daily. 90 tablet 1  . saccharomyces boulardii (FLORASTOR) 250 MG capsule Take 250 mg by mouth 2 (two) times daily.     No current facility-administered medications on file prior to visit.    Allergies  Allergen Reactions  . Omeprazole Diarrhea   Family History  Problem Relation Age of Onset   . Heart disease Father   . ALS Mother   . Diabetes Sister   . Hypertension Sister   . Heart failure Brother   . Heart attack Father   . Stroke Neg Hx    PE: BP 128/80 (BP Location: Left Arm, Patient Position: Sitting)   Pulse 83   Ht 6\' 1"  (1.854 m)   Wt 239 lb (108.4 kg)   SpO2 96%   BMI 31.53 kg/m  Body mass index is 31.53 kg/m. Wt Readings from Last 3 Encounters:  09/27/16 239 lb (108.4 kg)  09/07/16 235 lb (106.6 kg)  06/29/16 241 lb (109.3 kg)   Constitutional: overweight, in NAD Eyes: PERRLA, EOMI, no exophthalmos ENT: moist mucous membranes, no thyromegaly,  no cervical lymphadenopathy Cardiovascular: RRR, No MRG Respiratory: CTA B Gastrointestinal: abdomen soft, NT, ND, BS+ Musculoskeletal: no deformities, strength intact in all 4 Skin: moist, warm, no rashes Neurological: no tremor with outstretched hands, DTR normal in all 4  ASSESSMENT: 1. DM2, insulin-dependent, uncontrolled, with complications - CAD - CKD stage 3  PLAN:  1. Patient with long-standing, uncontrolled diabetes, on oral DM medication + premixed insulin + Trulicity. We need to stop Trulicity, unfortunately, as this is too costly and he is in the donut hole - In the meantime >> increase insulin - as he still has bouts of diarrhea >> I advised him to stop Metformin for 5 days to see if this improves. If not >> restart. - I suggested to:  Patient Instructions  Please continue: - Metformin-glipizide 500-2.5 mg x a day (stop for 5 days to see if diarrhea is better)  Please increase: - 70/30 insulin to 25 units before b'fast and 30 units before dinner  Stay off Trulicity for now, after you finish your stock.  Please return in 3 months with your sugar log.    - today, HbA1c is 6.9% (higher) - continue checking sugars at different times of the day - check 2x a day, rotating checks - advised for yearly eye exams >> he is UTD - Return to clinic in 3 mo with sugar log   Philemon Kingdom, MD  PhD [facility] Endocrinology

## 2016-10-01 ENCOUNTER — Other Ambulatory Visit: Payer: Self-pay

## 2016-10-01 NOTE — Patient Outreach (Signed)
[facility]) Care Management  10/01/2016  [name] [birth year] [id number]  TELEPHONE SCREENING Referral date:09/29/16 Referral source: Self referral Referral reason: Not able to afford prescriptions due to cost Insurance: [employer] health care Attempt #1  Telephone call to patient regarding self referral. Unable to reach patient. HIPAA compliant voice message left with call back phone number.    PLAN: RNCM will attempt 2nd telephone outreach to patient within 1 week.   Quinn Plowman RN,BSN,CCM [facility] Telephonic  [phone]

## 2016-10-02 NOTE — Patient Outreach (Signed)
[place] [facility]) Care Management  10/02/2016  [name] 08-29-1945 [id number]   Telephone screening Referral date: 09/29/16 Referral Reason: Unable to afford prescriptions Insurance: [place] health care.   PROVIDERS:  Cyndi Bender, PA-C Dr. Julianne Handler- cardiologist Dr. Cruzita Lederer - gastroenterology Dr. Nils Pyle- vascular  SOCIAL/ SUPPORTIVE CARE: Patient lives with wife. Strong support system.   SUBJECTIVE: Telephone call to patient regarding self referral.  HIPAA verified with patient. Patient request RNCM speak with his wife [name] regarding all of his health information. This RNCM discussed and offered [facility] care management services.  Patient wife agreed to services for patient.   MEDICATION: Wife states patient is unable to afford his Humulin insulin and xeralto.  Wife states patients Humulin insulin is approxmiately $400 per month.   DIABETES: Wife states patient has had diabetes since 2001.  Had diabetes education when initially diagnosed. Wife states patients A1c is 6.9. Wife states patient has gotten low once but has more instances where his blood sugar ranges in the 200's.  Wife verbally agreed to additional diabetic education for patient.  Wife states patient has a follow up appointment with his endocrinologist on 09/27/16. Wife states the doctor wants patient to stay off of his glipizide and metformin for 5 days due to the stomach problems he has due to diverticulitis. l PLAN: RNCM will refer patient to health coach for diabetic disease management.  Richard Plowman RN,BSN,CCM [facility] Telephonic  [phone]

## 2016-10-06 ENCOUNTER — Other Ambulatory Visit: Payer: Self-pay

## 2016-10-06 ENCOUNTER — Ambulatory Visit: Payer: Self-pay

## 2016-10-06 DIAGNOSIS — I10 Essential (primary) hypertension: Secondary | ICD-10-CM | POA: Diagnosis not present

## 2016-10-06 DIAGNOSIS — I2510 Atherosclerotic heart disease of native coronary artery without angina pectoris: Secondary | ICD-10-CM | POA: Diagnosis not present

## 2016-10-06 DIAGNOSIS — E1110 Type 2 diabetes mellitus with ketoacidosis without coma: Secondary | ICD-10-CM

## 2016-10-06 DIAGNOSIS — D509 Iron deficiency anemia, unspecified: Secondary | ICD-10-CM | POA: Diagnosis not present

## 2016-10-06 DIAGNOSIS — Z139 Encounter for screening, unspecified: Secondary | ICD-10-CM | POA: Diagnosis not present

## 2016-10-06 DIAGNOSIS — Z0000 Encounter for general adult medical examination without abnormal findings: Secondary | ICD-10-CM | POA: Diagnosis not present

## 2016-10-06 DIAGNOSIS — E119 Type 2 diabetes mellitus without complications: Secondary | ICD-10-CM | POA: Diagnosis not present

## 2016-10-06 DIAGNOSIS — Z794 Long term (current) use of insulin: Secondary | ICD-10-CM

## 2016-10-06 DIAGNOSIS — E782 Mixed hyperlipidemia: Secondary | ICD-10-CM | POA: Diagnosis not present

## 2016-10-06 NOTE — Patient Outreach (Signed)
[place] [facility]) Care Management  [place]  10/06/2016   [name] [birth year] [id number]  Telephonic assessment completed with wife.  Patient requested assessment be conducted with wife due to his hearing deficit.  Encounter Medications:  Outpatient Encounter Prescriptions as of 10/06/2016  Medication Sig Note  . atorvastatin (LIPITOR) 20 MG tablet Take 20 mg by mouth daily.  08/15/2014: Hold prescription until patient calls for refill. Received from: [place]:   . glipiZIDE-metformin (METAGLIP) 2.5-500 MG tablet TAKE TWO TABLETS BY MOUTH TWICE DAILY BEFORE  MEALS (Patient taking differently: 1 tablet 2 (two) times daily before a meal. TAKE TWO TABLETS BY MOUTH TWICE DAILY BEFORE  MEALS)   . Insulin Isophane & Regular Human (HUMULIN 70/30 MIX) (70-30) 100 UNIT/ML PEN Inject under skin 25 units before b'fast and 30 units before dinner   . Insulin Pen Needle (RELION PEN NEEDLES) 32G X 4 MM MISC Use 2x a day as advised   . metoprolol tartrate (LOPRESSOR) 25 MG tablet Take 1 tablet (25 mg total) by mouth 2 (two) times daily.   Marland Kitchen omeprazole (PRILOSEC) 20 MG capsule Take 20 mg by mouth daily. Reported on 11/13/2015   . ramipril (ALTACE) 10 MG capsule Take 10 mg by mouth daily.   . rivaroxaban (XARELTO) 20 MG TABS tablet Take 1 tablet (20 mg total) by mouth daily.   Marland Kitchen saccharomyces boulardii (FLORASTOR) 250 MG capsule Take 250 mg by mouth daily.    . Dulaglutide (TRULICITY) 1.5 WC/3.7SE SOPN Please inject under skin 1.5 mg weekly (Patient not taking: Reported on 10/06/2016)   . nitroGLYCERIN (NITROSTAT) 0.4 MG SL tablet Place 0.4 mg under the tongue every 5 (five) minutes as needed. 10/06/2016: Has not needed it.   No facility-administered encounter medications on file as of 10/06/2016.     Functional Status:  In your present state of health, do you have any difficulty performing the following activities: 10/06/2016  Hearing? Y  Vision? N   Difficulty concentrating or making decisions? N  Walking or climbing stairs? N  Dressing or bathing? N  Doing errands, shopping? N  Preparing Food and eating ? N  Using the Toilet? N  In the past six months, have you accidently leaked urine? N  Do you have problems with loss of bowel control? N  Managing your Medications? N  Managing your Finances? N  Housekeeping or managing your Housekeeping? N  Some recent data might be hidden    Fall/Depression Screening: Fall Risk  10/06/2016 10/03/2014  Falls in the past year? No No   PHQ 2/9 Scores 10/06/2016 10/01/2016 10/03/2014  PHQ - 2 Score 0 0 0    Patient requests assistance with improving adherence to diabetic dietary guidelines in order to reduce his A1C to his goal of 6.  Patient also stated he is interested in completing advance directives.  Plan: Patient will review educational materials provided, and be able to name high carbohydrate foods to avoid.           RN will Warden/ranger.           RN will follow up in June.  [name], RN, MSN [place] [phone] Fax [phone]

## 2016-10-12 ENCOUNTER — Other Ambulatory Visit: Payer: Self-pay | Admitting: Pharmacist

## 2016-10-12 ENCOUNTER — Ambulatory Visit: Payer: Self-pay | Admitting: Pharmacist

## 2016-10-12 NOTE — Patient Outreach (Signed)
[place] [facility]) Care Management  10/12/2016  [name] 06/04/1945 [id number]  71 year old male with PMHx including but not limited to T2DM, CAD, HTN, HLD, atrial fibrillation, and AAA referred to [place] for medication assistance with Xarelto and Humulin 70/30.    Successful phone-call to patient's wife this afternoon.  HIPAA identifiers verified. Patient has [employer].  Wife stated that her husband picked up a 90 day supply of insulin in April and paid ~$400.  Patient is also on Xarelto.  Both Humulin 70/30 and Xarelto are Tier 3 prescriptions and husband is currently in the coverage gap, therefore until he meets out-of-pocket total spend of $5000, he will pay co-insurance of 44% for generics and 35% for brand name.  Once he is in the catastrophic phase, he will pay 5% co-insurance.   Reviewed patient assistance plans (PAP) for both Xarelto and Humulin 70/30 with patient's wife.   Xarelto PAP: Income requirement < $49,380 for household of 2 persons, spend 4% of annual income on medications in this calendar year.  Humulin 70/30 PAP: Income requirement <$48,720 for household of 2 persons, spend $1000 on prescriptions in this calendar year.   [name] thinks that patient is eligible for both Xarelto and Humulin 70/30 PAPs.   [name] will call insurance to find out patient's out-of-pocket expenditures for 2018 have been.  She has [facility] fax number for [facility] AARP to fax me this information.  She can also use her monthly EOB to find out this information.  She will find documents to verify their income.    Plan:  Call patient in 1 week to follow-up on patient assistance documents.    [name], PharmD, [place] [phone]

## 2016-10-18 ENCOUNTER — Ambulatory Visit: Payer: Self-pay | Admitting: Pharmacist

## 2016-10-18 ENCOUNTER — Other Ambulatory Visit: Payer: Self-pay | Admitting: Pharmacist

## 2016-10-18 NOTE — Patient Outreach (Signed)
[place] [facility]) Care Management  10/18/2016  RUSELL MENEELY 1945-09-05 [id number]  Successful outreach phone call to patient's home. HIPAA identifiers verified with patient's wife.  [name] able to locate an EOB from insurance which states out-of-pocket expenses through August 07, 2016 are $2,345.61 therefore patient is eligible for patient assistance programs with Ranelle Oyster (for Xarelto) and [employer] (for Humulin 70/30).  Trulicity was stopped by patient's endocrinologist recently due to cost.  If provider approves, Trulicity can be included in patient assistance application for [employer].    Plan:  Meet at patient's home on Thursday, June 14, @ 2PM to complete patient assistance paperwork.   [name], PharmD, [place] [phone]

## 2016-10-21 ENCOUNTER — Other Ambulatory Visit: Payer: Self-pay | Admitting: Pharmacist

## 2016-10-21 ENCOUNTER — Encounter: Payer: Self-pay | Admitting: Cardiovascular Disease

## 2016-10-21 NOTE — Patient Outreach (Signed)
[facility]) Care Management  10/21/2016  [name] 18-Jul-1945 [id number]   House visit today with patient and patient's wife.  HIPAA identifiers verified.  Patient filled out patient assistant program applications for Xarelto and Humulin 70/30.  Patient able to provide proof of income and out-of-pocket spend for 2018. I will bring paperwork to Dr. Cruzita Lederer and Dr. Angelena Form tomorrow.    Plan: Follow-up with provider offices next week and update patient once paperwork all submitted.   [name], PharmD, [place] [phone]

## 2016-10-22 ENCOUNTER — Telehealth: Payer: Self-pay

## 2016-10-22 NOTE — Telephone Encounter (Signed)
**Note De-Identified [name] Obfuscation** Received a Xarelto Pt assistance application from [place] to complete and for Dr [employer]. I have completed application and placed it in Dr Camillia Herter mail bin awaiting his signature.  I will fax back to [place] once signed.

## 2016-10-25 NOTE — Telephone Encounter (Signed)
I have faxed the signed pt assistance application for Xarelto back to [facility]. I received conformation that the fax was successful.

## 2016-10-26 ENCOUNTER — Other Ambulatory Visit: Payer: Self-pay

## 2016-10-26 DIAGNOSIS — E1110 Type 2 diabetes mellitus with ketoacidosis without coma: Secondary | ICD-10-CM

## 2016-10-26 DIAGNOSIS — Z794 Long term (current) use of insulin: Secondary | ICD-10-CM

## 2016-10-26 NOTE — Patient Outreach (Signed)
[place] [facility]) Care Management  10/26/2016  TANDY LEWIN 1945/12/14 [id number]   Assessment completed with wife at husband's request.   Educational materials on diabetic dietary guidelines have been received, and wife reports they are trying to decrease carbohydrates and eat more vegetables. Completion of Advanced Directives [facility]) has not yet been done.  RN will follow up in July.  [name], RN, MSN [place] [phone] Fax [phone]

## 2016-10-27 ENCOUNTER — Ambulatory Visit: Payer: Self-pay

## 2016-10-28 ENCOUNTER — Other Ambulatory Visit: Payer: Self-pay | Admitting: Pharmacist

## 2016-10-28 ENCOUNTER — Telehealth: Payer: Self-pay | Admitting: Physician Assistant

## 2016-10-28 NOTE — Patient Outreach (Signed)
[place] [facility]) Care Management  10/28/2016  [name] 12-Apr-1946 [id number]   Unsuccessful telephone call to patient today.  Left HIPAA compliant voicemail.  Will re-try calling patient within the next week.   Left message with Dr. Arman Filter office requesting update on completion of patient assistance application faxed to office last Friday.  Also sent note stating Trulicity can be added to application if provider deems appropriate as patient recently stopped this medication due to cost and Trulicity included in [name] patient assistance program.    Update: 11:44 Received incoming call from patient's wife.  HIPAA identifiers verified. Updated patient's wife on status of paperwork: Xarelto application completed by Dr. Camillia Herter office and faxed to [facility] and [name], still waiting on paperwork from Dr. Arman Filter office for Humulin 70/30.  Will call patient again once provider completes forms for Humulin 91/47 and application faxed to [name].    1:06 Received return call from [name] who works at Dr. Arman Filter office.  She has not found the paperwork that I faxed last Friday but states that it could have been placed in Dr. Arman Filter office already.  She requests that I re-fax paperwork to office again.  Dr. Cruzita Lederer is out of the office for 3 weeks but there are other providers who can look at the paperwork.  She will discuss with on-call providers regarding if Trulicity warranted right now to add to application vs waiting until patient's next office visit.    Plan: Resend fax of [employer] application for Humulin 70/30 to provider office tomorrow morning.  Follow-up with provider office next week regarding completion of paperwork and if Trulicity will be added to the application.    [name], PharmD, [name] [phone]

## 2016-10-28 NOTE — Telephone Encounter (Signed)
Please advise in Dr.Gherghe's absences. [facility] is faxing over some paperwork to fill out, they are requesting either we do the paperwork for the Trulicity to see if we can get it approved, or to continue with the insulin they are currently on until they come for their next appointment in 3 months. Can we do the paperwork for the Trulicity as we had to stop it at last visit due to cost. Thank you!   

## 2016-10-28 NOTE — Telephone Encounter (Signed)
[place] w/ [facility] faxed some ppw and want to check on status of return. She also noted that she can add Trulicity to medications if needed. Patient' stopped Trulicity due to cost. ° °Please call her back at number provided. ° °Thank you, ° °-LL  °

## 2016-10-29 NOTE — Telephone Encounter (Signed)
fill it out for the humulin 70/30

## 2016-10-29 NOTE — Telephone Encounter (Signed)
This will be done when her primary endocrinologist comes back 

## 2016-10-29 NOTE — Telephone Encounter (Signed)
This is an urgent request and can not wait until she returns as he is almost out of medication.  Should I fill it out for the humulin 70/30 until he is seen? Or change it back to the Trulicity that he was recently on, but had to stop due to being in the donut hole.

## 2016-10-29 NOTE — Telephone Encounter (Signed)
Completed.

## 2016-11-02 ENCOUNTER — Other Ambulatory Visit: Payer: Self-pay | Admitting: Cardiovascular Disease

## 2016-11-02 NOTE — Telephone Encounter (Signed)
Submitted, was faxing to incorrect number. It was faxed to the correct number now.

## 2016-11-02 NOTE — Telephone Encounter (Signed)
Needs request forms for Humulin faxed back Fax:[phone]. ° °Thank you, ° °-LL °

## 2016-11-04 ENCOUNTER — Other Ambulatory Visit: Payer: Self-pay | Admitting: Pharmacist

## 2016-11-04 ENCOUNTER — Ambulatory Visit: Payer: Self-pay | Admitting: Pharmacist

## 2016-11-04 NOTE — Patient Outreach (Signed)
[place] [facility]) Care Management  11/04/2016  URAL ACREE [birth year] [id number]   Successful telephone encounter with patient's wife. HIPAA identifiers verified. Updated patient's wife that completed health care provider forms for [facility] received on 11/02/16.  Patient's endocrinologist is out of the office therefore decision to include Trulicity in application deferred until provider returns.  PAP application faxed to [facility] on 11/03/16.  I will call [employer] next week to check status of application and will update patient and patient's wife with any news.   Application for Xarelto already completed, faxed and currently in process with [place].   [name], PharmD, [place] [phone]

## 2016-11-04 NOTE — Progress Notes (Signed)
Chief Complaint  Patient presents with  . Follow-up    CAD    History of Present Illness: 71 yo WM with history of CAD, HTN, hyperlipidemia, atrial fibrillation, AAA, mild mitral regurgitation and DM who is here today for cardiac follow up. In 2001 he had an anterior STEMI treated with a bare-metal stent in the LAD. He underwent catheterization in 2006 and the stent was patent and there was an 80% narrowing in the diagonal branch. His ejection fraction was 50%. He was noted to be in atrial fibrillation in October 2013 and was started on Xarelto and DCCV was arranged on 03/27/12 with return to [facility]. He has since converted back to atrial fibrillation and has been controlled on metoprolol. Echo 03/02/12 with moderate LVH, LVEF 40-45%, mild MR. AAA followed in [facility].   He is here today for follow up. The patient denies any chest pain, dyspnea, palpitations, lower extremity edema, orthopnea, PND, dizziness, near syncope or syncope.    Primary Care Physician: Cyndi Bender, PA-C  Past Medical History:  Diagnosis Date  . AAA (abdominal aortic aneurysm) ([place])   . Allergy   . Atrial fibrillation [facility]) October 2013   Xarelto started; duration unknown  . Cataract   . Chronic kidney disease    Stage III   . Coronary artery disease    post bare-metal stent to LAD in 2001  . Diabetes mellitus   . Hypertension   . Myocardial infarct ([place]) 2001  . Obesity     Past Surgical History:  Procedure Laterality Date  . CARDIAC CATHETERIZATION  2006   stable with primarily nonobstructive disease  . CARDIOVERSION  03/27/2012   Procedure: CARDIOVERSION;  Surgeon: Carlena Bjornstad, MD;  Location: [facility] ENDOSCOPY;  Service: Cardiovascular;  Laterality: N/A;  . CORONARY ANGIOPLASTY WITH STENT PLACEMENT  2001   bare-metal stent to the LAD  . HERNIA REPAIR  2006  . SKIN GRAFT  1981    Current Outpatient Prescriptions  Medication Sig Dispense Refill  . atorvastatin (LIPITOR) 20 MG tablet Take 20 mg by mouth  daily.     Marland Kitchen glipiZIDE-metformin (METAGLIP) 2.5-500 MG tablet TAKE TWO TABLETS BY MOUTH TWICE DAILY BEFORE  MEALS (Patient taking differently: 1 tablet 2 (two) times daily before a meal. TAKE TWO TABLETS BY MOUTH TWICE DAILY BEFORE  MEALS) 360 tablet 0  . Insulin Isophane & Regular Human (HUMULIN 70/30 MIX) (70-30) 100 UNIT/ML PEN Inject under skin 25 units before b'fast and 30 units before dinner 45 mL 3  . Insulin Pen Needle (RELION PEN NEEDLES) 32G X 4 MM MISC Use 2x a day as advised 200 each 3  . metoprolol tartrate (LOPRESSOR) 25 MG tablet TAKE 1 TABLET BY MOUTH TWO  TIMES DAILY 180 tablet 0  . nitroGLYCERIN (NITROSTAT) 0.4 MG SL tablet Place 0.4 mg under the tongue every 5 (five) minutes as needed.    Marland Kitchen omeprazole (PRILOSEC) 20 MG capsule Take 20 mg by mouth daily. Reported on 11/13/2015    . ramipril (ALTACE) 10 MG capsule Take 10 mg by mouth daily.    . rivaroxaban (XARELTO) 20 MG TABS tablet Take 1 tablet (20 mg total) by mouth daily. 90 tablet 1  . saccharomyces boulardii (FLORASTOR) 250 MG capsule Take 250 mg by mouth daily.     . Dulaglutide (TRULICITY) 1.5 UU/7.2ZD SOPN Please inject under skin 1.5 mg weekly (Patient not taking: Reported on 10/06/2016) 4 pen 11   No current facility-administered medications for this visit.  Allergies  Allergen Reactions  . Omeprazole Diarrhea    Social History   Social History  . Marital status: Married    Spouse name: N/A  . Number of children: N/A  . Years of education: N/A   Occupational History  . Not on file.   Social History Main Topics  . Smoking status: Former Smoker    Packs/day: 0.50    Years: 40.00    Types: Cigarettes, Pipe, Cigars    Quit date: 05/11/1999  . Smokeless tobacco: Never Used  . Alcohol use No  . Drug use: No  . Sexual activity: Yes   Other Topics Concern  . Not on file   Social History Narrative   Married    Retired: [employer]   3 grown children       Family History  Problem Relation Age of Onset    . Heart disease Father   . Heart attack Father   . ALS Mother   . Diabetes Sister   . Hypertension Sister   . Heart failure Brother   . Stroke Neg Hx     Review of Systems:  As stated in the HPI and otherwise negative.   BP 96/60   Pulse 71   Ht 6\' 1"  (1.854 m)   Wt 243 lb 6.4 oz (110.4 kg)   SpO2 97%   BMI 32.11 kg/m   Physical Examination:  General: Well developed, well nourished, NAD  HEENT: OP clear, mucus membranes moist  SKIN: warm, dry. No rashes. Neuro: No focal deficits  Musculoskeletal: Muscle strength 5/5 all ext  Psychiatric: Mood and affect normal  Neck: No JVD, no carotid bruits, no thyromegaly, no lymphadenopathy.  Lungs:Clear bilaterally, no wheezes, rhonci, crackles Cardiovascular: Irreg irreg with Regular rate and rhythm. No murmurs, gallops or rubs. Abdomen:Soft. Bowel sounds present. Non-tender.  Extremities: No lower extremity edema. Pulses are 2 + in the bilateral DP/PT.  Echo 03/02/12:  Left ventricle: The cavity size was normal. Wall thickness was increased in a pattern of moderate LVH. Systolic function was mildly to moderately reduced. The estimated ejection fraction was in the range of 40% to 45%. Wall motion was normal; there were no regional wall motion abnormalities. - Mitral valve: Mild regurgitation. - Left atrium: The atrium was moderately dilated. - Atrial septum: No defect or patent foramen ovale was identified. - Pulmonary arteries: PA peak pressure: 26mm Hg (S).  EKG:  EKG is ordered today. The ekg ordered today demonstrates atrial fibrillation, rate 76 bpm.   Recent Labs: No results found for requested labs within last 8760 hours.   Lipid Panel Followed in primary care   Wt Readings from Last 3 Encounters:  11/05/16 243 lb 6.4 oz (110.4 kg)  10/06/16 240 lb (108.9 kg)  09/27/16 239 lb (108.4 kg)     Other studies Reviewed: Additional studies/ records that were reviewed today include:  Review of the above records  demonstrates:  Assessment and Plan:   1. CAD without angina: He has no chest pain suggestive of angina. Will continue statin and beta blocker. He is not on an ASA since he is on Xarelto.  Echo now to assess LV function (no echo since 2013)  2. Atrial fibrillation, persistent: He is having no symptoms related to this. Rate controlled on metoprolol. He is on Xarelto.  No changes today  3. HYPERTENSION: BP controlled. No changes. His BP is a little lower today but he has no dizziness.   4. Mitral regurgitation: Will arrange echo to  assess.   Current medicines are reviewed at length with the patient today.  The patient does not have concerns regarding medicines.  The following changes have been made:  no change  Labs/ tests ordered today include:  Orders Placed This Encounter  Procedures  . EKG 12-Lead  . ECHOCARDIOGRAM COMPLETE    Disposition:   FU with me in 12  months  Signed, Lauree Chandler, MD 11/05/2016 8:46 AM    [facility] [address], [place], [place]  [address] Phone: [phone]; Fax: [phone]

## 2016-11-05 ENCOUNTER — Ambulatory Visit (INDEPENDENT_AMBULATORY_CARE_PROVIDER_SITE_OTHER): Payer: Medicare Other | Admitting: Cardiovascular Disease

## 2016-11-05 ENCOUNTER — Encounter: Payer: Self-pay | Admitting: Cardiovascular Disease

## 2016-11-05 VITALS — BP 96/60 | HR 71 | Ht 73.0 in | Wt 243.4 lb

## 2016-11-05 DIAGNOSIS — I10 Essential (primary) hypertension: Secondary | ICD-10-CM | POA: Diagnosis not present

## 2016-11-05 DIAGNOSIS — I481 Persistent atrial fibrillation: Secondary | ICD-10-CM | POA: Diagnosis not present

## 2016-11-05 DIAGNOSIS — I2510 Atherosclerotic heart disease of native coronary artery without angina pectoris: Secondary | ICD-10-CM | POA: Diagnosis not present

## 2016-11-05 DIAGNOSIS — I4819 Other persistent atrial fibrillation: Secondary | ICD-10-CM

## 2016-11-05 DIAGNOSIS — I340 Nonrheumatic mitral (valve) insufficiency: Secondary | ICD-10-CM

## 2016-11-05 NOTE — Patient Instructions (Signed)

## 2016-11-15 ENCOUNTER — Telehealth: Payer: Self-pay | Admitting: Internal Medicine

## 2016-11-15 ENCOUNTER — Telehealth: Payer: Self-pay | Admitting: Pharmacist

## 2016-11-15 NOTE — Telephone Encounter (Signed)
[facility] called to verify if the medication Insulin Isophane & Regular Human (HUMULIN 70/30 KWIKPEN) (70-30) 100 UNIT/ML PEN VIALS were delivered to the office on Friday and put it in the refrigerator. Call [facility] to advise.  °

## 2016-11-15 NOTE — Telephone Encounter (Signed)
Called [facility], they will contact patient and notify them that the insulin is in the office.  

## 2016-11-15 NOTE — Patient Outreach (Signed)
[place] [facility]) Care Management  11/15/2016  [name] 05/18/1945 [id number]   1.  Patient approved for [place] program for Humulin 70/30 through May 09, 2017.   Four month supply of medication shipped to Dr. Arman Filter office and delivered 11/12/2016 via tracking information from [employer].   Verified with Almyra Free, RN, that medication is at office and is being stored in the refrigerator.    Refill requests must be called in to [facility] [phone]) from provider office at least  1 month prior to patient needing medication.    If another medication needs to be added to [facility] application in the future, may use patient ID: [id number] and only need to fax in HCP application page.   2.  Still awaiting confirmation of approval or denial for Xarelto via [name] and [employer]. Line busy for 45 minutes this afternoon.   Successful telephone encounter with patient's wife. HIPAA identifiers verified. Patient has cardiology office visit in [place] on Wednesday and will pick up Humulin 70/30 after appointment.    Plan: Call [name] and [place] in the morning to follow-up on Xarelto application.  Will update patient accordingly.    [name], PharmD, [place] [phone]

## 2016-11-16 ENCOUNTER — Telehealth: Payer: Self-pay | Admitting: Pharmacist

## 2016-11-16 NOTE — Patient Outreach (Signed)
[place] [facility]) Care Management  11/16/2016  ERI PLATTEN 01/09/1946 [id number]  Spoke with representative from [name] and [name] regarding Xarelto patient assistance application.  They received faxed application on June 92EQ and started process for patient but now cannot locate paperwork.  They have requested that I re-fax application and they will make it a high priority case.    Successful outreach telephone call to patient to update on status of application.  HIPAA identifiers verified. Patient voiced understanding.    Plan: Refax application this afternoon.  Confirm with [employer] for successful receipt and escalating priority status of application.     [name], PharmD, [place] [phone]

## 2016-11-17 ENCOUNTER — Other Ambulatory Visit: Payer: Self-pay

## 2016-11-17 ENCOUNTER — Ambulatory Visit (HOSPITAL_COMMUNITY): Payer: Medicare Other | Attending: Cardiovascular Disease

## 2016-11-17 ENCOUNTER — Telehealth: Payer: Self-pay | Admitting: Pharmacist

## 2016-11-17 DIAGNOSIS — I2510 Atherosclerotic heart disease of native coronary artery without angina pectoris: Secondary | ICD-10-CM | POA: Diagnosis not present

## 2016-11-17 DIAGNOSIS — I071 Rheumatic tricuspid insufficiency: Secondary | ICD-10-CM | POA: Insufficient documentation

## 2016-11-17 DIAGNOSIS — I481 Persistent atrial fibrillation: Secondary | ICD-10-CM

## 2016-11-17 DIAGNOSIS — I4819 Other persistent atrial fibrillation: Secondary | ICD-10-CM

## 2016-11-17 DIAGNOSIS — E119 Type 2 diabetes mellitus without complications: Secondary | ICD-10-CM | POA: Diagnosis not present

## 2016-11-17 MED ORDER — PERFLUTREN LIPID MICROSPHERE
1.0000 mL | INTRAVENOUS | Status: AC | PRN
Start: 2016-11-17 — End: 2016-11-17
  Administered 2016-11-17: 2 mL via INTRAVENOUS

## 2016-11-17 NOTE — Patient Outreach (Deleted)
[place] [facility]) Care Management  11/17/2016  [name] 01/22/1946 [id number]    Received incoming phone-call from patient's wife regarding insulin shipped from [facility].  Vials of humulin 70/30 were sent instead of the pen.  Patient prefers the pen and does not feel comfortable using the syringes.  Spoke with representative from Dr. Arman Filter office regarding application form which will need to be re-submitted with correct insulin formulation.  I will re-fax a copy of the healthcare provider page to the office tomorrow.    Plan: Update patient regarding plan of care.  Re-fax application to [employer] tomorrow morning.   [name], PharmD, [place] [phone]

## 2016-11-17 NOTE — Patient Outreach (Signed)
[place] [facility]) Care Management  11/17/2016  [name] Nov 28, 1945 [id number]   Unsuccessful attempt to reach patient.  HIPAA appropriate message left with call back information. If no call back RN will make another attempt within 2 weeks.  [name], RN, MSN [place] [phone] Fax [phone]

## 2016-11-17 NOTE — Patient Outreach (Signed)
[place] [facility]) Care Management  11/17/2016  [name] 1946/03/02 [id number]    Received incoming phone-call from patient's spouse regarding insulin shipped from [facility].  Vials of humulin 70/30 were sent instead of the pen.  Patient prefers the pen and does not feel comfortable using the syringes.  Spoke with representative from Dr. Arman Filter office regarding application form which will need to be re-submitted with correct insulin formulation.  I will re-fax a copy of the healthcare provider page to the office tomorrow.    Plan: Update patient regarding plan of care.  Re-fax application to [employer] tomorrow morning.   [name], PharmD, [place] [phone]

## 2016-11-18 ENCOUNTER — Other Ambulatory Visit: Payer: Self-pay

## 2016-11-18 DIAGNOSIS — Z794 Long term (current) use of insulin: Secondary | ICD-10-CM

## 2016-11-18 DIAGNOSIS — E1110 Type 2 diabetes mellitus with ketoacidosis without coma: Secondary | ICD-10-CM

## 2016-11-18 NOTE — Patient Outreach (Signed)
[place] [facility]) Care Management  11/18/2016  [name] 06-Nov-1945 [id number]   Assessment completed with wife communicating for her husband (at his request).  Patient was seen by cardiologist on 11-17-16 for history of CAD.  [facility] pharmacist [name] is assisting with drug program to reduce the cost of his Xarelto.  Wife reports patient's cbgs have been trending up since he stopped taking Trulicity.  Encouraged wife to send a week's worth of cbg readings to his doctor for evaluation.  Plan:  Patient will continue efforts to improve adherence to diabetic dietary guidelines.           Patient will report cbg readings to MD.           RN will follow up in July.  [name], RN, MSN [place] [phone] Fax [phone]

## 2016-11-25 ENCOUNTER — Ambulatory Visit: Payer: Medicare Other

## 2016-11-30 ENCOUNTER — Other Ambulatory Visit: Payer: Self-pay | Admitting: Pharmacist

## 2016-11-30 NOTE — Patient Outreach (Signed)
[place] [facility]) Care Management  11/30/2016  [name] 1945/07/26 [id number]  Patient approved for Humulin 70/30 pens through [facility].  Insulin will be expedited to provider office and should arrive by early next week.  All refills will need to be requested by provider office.    Patient denied patient assistance through [place] for Xarelto due to income (including IRA, pensions, and Fish farm manager) over eligibility requirements.   Successful telephone encounter with patient's wife today.  HIPAA identifiers verified. Spouse voiced understanding.  She states they may call [name] and [name] to appeal.  Patient will call Dr. Arman Filter office later this week to inquire if medication has arrived.  Patient and spouse still have insulin supply they are using from last refill at pharmacy.  Patient has no further concerns or medication issues.    [facility] pharmacy will close patient case for now but am happy to assist in the future as needed.  I will follow up with patient in September after office visit with endocrinologist as patient may need to resume Trulicity.  If this occurs, I will assist with patient assistance application.    [name], PharmD, [place] [phone]

## 2016-12-02 ENCOUNTER — Other Ambulatory Visit: Payer: Self-pay

## 2016-12-02 DIAGNOSIS — E1110 Type 2 diabetes mellitus with ketoacidosis without coma: Secondary | ICD-10-CM

## 2016-12-02 DIAGNOSIS — Z794 Long term (current) use of insulin: Secondary | ICD-10-CM

## 2016-12-02 NOTE — Patient Outreach (Signed)
[place] [facility]) Care Management  12/02/2016  [name] [birth year] [id number]  Telephone assessment conducted with wife (at request of patient, who is [facility]).  Fasting CBG reportedly was 195 this am.  Wife reports cbgs have been up and down since stopping Trulicity.   Plan:  Patient will provide MD with CBG records to evaluate for resuming Trulicity.           Patient will continue efforts to improve adherence to diabetic dietary guidelines.           RN will follow up after pt's appointment with MD on 01-06-17  [name], RN, MSN [place] [phone] Fax [phone]

## 2016-12-03 ENCOUNTER — Other Ambulatory Visit: Payer: Self-pay

## 2016-12-03 NOTE — Patient Outreach (Signed)
[facility]) Care Management  12/03/2016  [name] 05/14/1945 [id number]   Medication Adherence call to Mr.[name]  Call Mr. Bundren because he is showing  past due under [facility] on atorvastatin 20 mg spoke to patient's wife she takes care of patient's medication she said patient was receiving samples from doctor's office and he still have some medication.    [place] Management Direct Dial [phone]  Fax [phone] [email or web address]

## 2016-12-07 ENCOUNTER — Telehealth: Payer: Self-pay | Admitting: Internal Medicine

## 2016-12-07 ENCOUNTER — Telehealth: Payer: Self-pay

## 2016-12-07 NOTE — Telephone Encounter (Signed)
Called patients wife and explained that I had called [facility] and we should have his insulin today. She will come by and pick them both up and bring sugar logs by as there glucose is increasing.

## 2016-12-07 NOTE — Telephone Encounter (Signed)
[name] [name] calling to find out if his meds are at the office?  Thank you,  -LL

## 2016-12-08 NOTE — Telephone Encounter (Signed)
[name] called in to see if insulin was ready for pick up before they drive to [place]. Please call patient and advise. OK to leave message.

## 2016-12-09 NOTE — Telephone Encounter (Signed)
[name] calling in reference to insulin for patient. Please call patient and advise if insulin is ready for pick up.

## 2016-12-18 ENCOUNTER — Other Ambulatory Visit: Payer: Self-pay | Admitting: Internal Medicine

## 2017-01-06 ENCOUNTER — Encounter: Payer: Self-pay | Admitting: Internal Medicine

## 2017-01-06 ENCOUNTER — Ambulatory Visit (INDEPENDENT_AMBULATORY_CARE_PROVIDER_SITE_OTHER): Payer: Medicare Other | Admitting: Internal Medicine

## 2017-01-06 VITALS — BP 114/70 | HR 70 | Ht 73.0 in | Wt 247.0 lb

## 2017-01-06 DIAGNOSIS — E785 Hyperlipidemia, unspecified: Secondary | ICD-10-CM

## 2017-01-06 DIAGNOSIS — E1151 Type 2 diabetes mellitus with diabetic peripheral angiopathy without gangrene: Secondary | ICD-10-CM

## 2017-01-06 DIAGNOSIS — E1165 Type 2 diabetes mellitus with hyperglycemia: Secondary | ICD-10-CM

## 2017-01-06 DIAGNOSIS — IMO0002 Reserved for concepts with insufficient information to code with codable children: Secondary | ICD-10-CM

## 2017-01-06 LAB — POCT GLYCOSYLATED HEMOGLOBIN (HGB A1C): HEMOGLOBIN A1C: 6.8

## 2017-01-06 MED ORDER — INSULIN ISOPHANE & REGULAR (HUMAN 70-30)100 UNIT/ML KWIKPEN: PEN_INJECTOR | SUBCUTANEOUS | 3 refills | Status: DC

## 2017-01-06 MED ORDER — DULAGLUTIDE 1.5 MG/0.5ML ~~LOC~~ SOAJ: SUBCUTANEOUS | 3 refills | Status: DC

## 2017-01-06 NOTE — Addendum Note (Signed)
Addended by: Verlin Grills T on: 01/06/2017 11:27 AM   Modules accepted: Orders

## 2017-01-06 NOTE — Progress Notes (Signed)
Patient ID: [name], male   DOB: [birth year], 71 y.o.   MRN: [id number]  HPI: [name] is a 71 y.o.-year-old male, returning for f/u for DM2, dx 1995, insulin-dependent, uncontrolled, with complications (CKD stage 3, CAD - AMI, s/p stent LAD 2001). Last visit 3 mo ago.Marland Kitchen He is here with his wife who offers part of the hx especially about insulin dosing and diet.  He still has some episodes of diarrhea (sees Dr. Armonie Staten Bartlett) >> finished ABx courses. Not better off Metformin. He restarted.  Last hemoglobin A1c was: Lab Results  Component Value Date   HGBA1C 6.9 09/27/2016   HGBA1C 6.6 06/29/2016   HGBA1C 6.7 03/29/2016   He is on on: - Humulin 70/30 - inject 30 min before meals: 20 >> 25 units before b'fast 25 >> 30 units before dinner - Metformin-glipizide 500-2.5 mg 2 tabs 2x a day He was on Tanzeum 50 mg weekly (started 09/2015) - >200$ per month! >> Trulicity 1.5 mg weekly >> this is too expensive!  Pt checks his sugars 1-2x a day: - am: 128-187 >> 96, 121-170, 185 >> 124-168, 170 >> 129-167, 184 >> 108, 125-173 - 2h after b'fast: n/c >> 131 >> n/c >> 223 - before lunch: 161-191 >> n/c >> 198, 215 >> n/c - 2h after lunch: 214 >> 132 >> n/c >> 179 >> n/c >> 184, 231 - before dinner:  103-178, 200, 236 >> 97-183, 209, 319! >> 137-218, 240 >> 133-242 - 2h after dinner:146, 210-255 >> 209 >> 203, 220 >> n/c >> 176 - bedtime: 67, 98-158, 196 >> 69 x1, 107-141, 222 >> 88, 113-192, 240 >> 129, 162 - nighttime: n/c >> 168 Lowest sugar was 85 >> 69 >> 108; No hypoglycemia awareness.  Highest sugar was 243 >> 319 >> 242.  Pt's meals are: - Breakfast: eggs, toast, ham/sausage or cereals + milk - Lunch: sandwich, vegetables, meat - Dinner: seafood, meat, vegetables - Snacks: nabs He exercises 1x every 2 weeks.  Reviewed labs from PCP: 09/30/2015: - Hep C virus antibody <0.1 - Lipids: 126/68/44/68 - CMP normal, except glucose 209, BUN/creatinine 22/1.39, GFR 51, potassium 5.4  (3.5-5.2), CO2 17 (18-29) - CBC with differential normal - B12 vitamin 305 (65-784)  04/01/2015: - ACR 56.3 - CBC with differential normal - Lipids: 116/56/42/63 - CMP: Glucose 193, BUN/creatinine 21/1.23, GFR 60, LFTs normal - PSA 0.5    - + CKD stage 3:  Lab Results  Component Value Date   BUN 14 03/28/2014   CREATININE 1.4 (A) 03/28/2014  On Ramipril.. - lipids: Lab Results  Component Value Date   CHOL 131 03/28/2014   HDL 47 03/28/2014   LDLCALC 68 03/28/2014   TRIG 82 03/28/2014  On Lipitor.  - last eye exam was in 04/2016 >> No DR. - denies numbness and tingling in his feet. He had a foot exam on 04/02/2015 >> normal.   He still has persistent diarrhea after his diverticulitis episode summer 2017. Sees GI.  ROS: Constitutional: no weight gain/no weight loss, no fatigue, no subjective hyperthermia, no subjective hypothermia Eyes: no blurry vision, no xerophthalmia ENT: no sore throat, no nodules palpated in throat, no dysphagia, no odynophagia, no hoarseness Cardiovascular: no CP/no SOB/no palpitations/no leg swelling Respiratory: no cough/no SOB/no wheezing Gastrointestinal: no N/no V/+ D/no C/no acid reflux Musculoskeletal: no muscle aches/no joint aches Skin: no rashes, no hair loss Neurological: no tremors/no numbness/no tingling/no dizziness  I reviewed pt's medications, allergies, PMH, social hx, family hx, and  changes were documented in the history of present illness. Otherwise, unchanged from my initial visit note.  Past Medical History:  Diagnosis Date  . AAA (abdominal aortic aneurysm) ([place])   . Allergy   . Atrial fibrillation [facility]) October 2013   Xarelto started; duration unknown  . Cataract   . Chronic kidney disease    Stage III   . Coronary artery disease    post bare-metal stent to LAD in 2001  . Diabetes mellitus   . Hypertension   . Myocardial infarct ([place]) 2001  . Obesity    Past Surgical History:  Procedure Laterality Date  .  CARDIAC CATHETERIZATION  2006   stable with primarily nonobstructive disease  . CARDIOVERSION  03/27/2012   Procedure: CARDIOVERSION;  Surgeon: Carlena Bjornstad, MD;  Location: [facility] ENDOSCOPY;  Service: Cardiovascular;  Laterality: N/A;  . CORONARY ANGIOPLASTY WITH STENT PLACEMENT  2001   bare-metal stent to the LAD  . HERNIA REPAIR  2006  . SKIN GRAFT  1981   Social History Main Topics  . Smoking status: Former Smoker -- 0.50 packs/day for 40 years    Types: Cigarettes, Pipe, Cigars    Quit date: 05/11/1999  . Smokeless tobacco: Never Used  . Alcohol Use: No  . Drug Use: No   Social History Narrative   Married    Retired: [employer]   3 grown children   Current Outpatient Prescriptions on File Prior to Visit  Medication Sig Dispense Refill  . atorvastatin (LIPITOR) 20 MG tablet Take 20 mg by mouth daily.     . Dulaglutide (TRULICITY) 1.5 CB/7.6EG SOPN Please inject under skin 1.5 mg weekly 4 pen 11  . glipiZIDE-metformin (METAGLIP) 2.5-500 MG tablet TAKE TWO TABLETS BY MOUTH  TWICE DAILY BEFORE MEALS 360 tablet 1  . Insulin Isophane & Regular Human (HUMULIN 70/30 MIX) (70-30) 100 UNIT/ML PEN Inject under skin 25 units before b'fast and 30 units before dinner 45 mL 3  . Insulin Pen Needle (RELION PEN NEEDLES) 32G X 4 MM MISC Use 2x a day as advised 200 each 3  . metoprolol tartrate (LOPRESSOR) 25 MG tablet TAKE 1 TABLET BY MOUTH TWO  TIMES DAILY 180 tablet 0  . nitroGLYCERIN (NITROSTAT) 0.4 MG SL tablet Place 0.4 mg under the tongue every 5 (five) minutes as needed.    Marland Kitchen omeprazole (PRILOSEC) 20 MG capsule Take 20 mg by mouth daily. Reported on 11/13/2015    . ramipril (ALTACE) 10 MG capsule Take 10 mg by mouth daily.    . rivaroxaban (XARELTO) 20 MG TABS tablet Take 1 tablet (20 mg total) by mouth daily. 90 tablet 1  . saccharomyces boulardii (FLORASTOR) 250 MG capsule Take 250 mg by mouth daily.      No current facility-administered medications on file prior to visit.    Allergies   Allergen Reactions  . Omeprazole Diarrhea   Family History  Problem Relation Age of Onset  . Heart disease Father   . Heart attack Father   . ALS Mother   . Diabetes Sister   . Hypertension Sister   . Heart failure Brother   . Stroke Neg Hx    PE: BP 114/70   Pulse 70   Ht 6\' 1"  (1.854 m)   Wt 247 lb (112 kg)   SpO2 96%   BMI 32.59 kg/m  Body mass index is 32.59 kg/m. Wt Readings from Last 3 Encounters:  01/06/17 247 lb (112 kg)  11/18/16 243 lb (110.2 kg)  11/05/16  243 lb 6.4 oz (110.4 kg)   Constitutional: overweight, in NAD Eyes: PERRLA, EOMI, no exophthalmos ENT: moist mucous membranes, no thyromegaly, no cervical lymphadenopathy Cardiovascular: RRR, No MRG Respiratory: CTA B Gastrointestinal: abdomen soft, NT, ND, BS+ Musculoskeletal: no deformities, strength intact in all 4 Skin: moist, warm, no rashes Neurological: no tremor with outstretched hands, DTR normal in all 4  ASSESSMENT: 1. DM2, insulin-dependent, uncontrolled, with complications - CAD - CKD stage 3  PLAN:  1. Patient with long-standing, Uncontrolled diabetes, with better controlled lately but occasional high sugars after meals, especially after he stopped Trulicity at last visit due to price. However, he is close to the catastrophic insurance plan so he would like to restart the Trulicity. At this visit, we will start this back and reduce the 70s 30 insulin doses. - He is also on metformin and glipizide, which we tried to hold at last visit to see if his diarrhea improved. Since it did not, he restarted on the medication. - I suggested to:  Patient Instructions  Please continue: - Metformin-glipizide 500-2.5 mg x a day  Please decrease: - 70/30 insulin 20 units before b'fast and 25 units before dinner  Please start Trulicity 1.5 mg weekly.   Please return in 3 months with your sugar log.  - today, HbA1c is 6.8% (slightly better) - continue checking sugars at different times of the day -  check 2x a day, rotating checks - advised for yearly eye exams >> he is UTD - He will get labs with PCP in 01/2017. Will try to obtain these labs - Return to clinic in 3 mo with sugar log   2. HL - He continues on Lipitor  - Reviewed latest lipid panel >> LDL at goal - We'll obtain latest labs from PCP next month   Philemon Kingdom, MD PhD [facility] Endocrinology

## 2017-01-06 NOTE — Patient Instructions (Addendum)
Please continue: - Metformin-glipizide 500-2.5 mg x2  twice a day  Please decrease: - 70/30 insulin 20 units before b'fast and 25 units before dinner  Please start Trulicity 1.5 mg weekly.   Please return in 3 months with your sugar log.

## 2017-01-11 ENCOUNTER — Ambulatory Visit: Payer: Self-pay | Admitting: Pharmacist

## 2017-01-11 ENCOUNTER — Telehealth: Payer: Self-pay | Admitting: Pharmacist

## 2017-01-11 ENCOUNTER — Other Ambulatory Visit: Payer: Self-pay

## 2017-01-11 VITALS — Ht 73.0 in | Wt 247.0 lb

## 2017-01-11 DIAGNOSIS — E1110 Type 2 diabetes mellitus with ketoacidosis without coma: Secondary | ICD-10-CM

## 2017-01-11 DIAGNOSIS — Z794 Long term (current) use of insulin: Secondary | ICD-10-CM

## 2017-01-11 NOTE — Patient Outreach (Signed)
[facility]) Care Management  01/11/2017  [name] 1946-03-03 [id number]  Telephone assessment conducted with wife (at husband's request).  Dr. Cruzita Lederer is resuming Trulicity to improved diabetic self-management.  His A1C was 6.8 on 01-06-17.  Patient's weight was 147 pounds at MD visit.  His weight is up from 243 pounds in June.    Education on diabetic dietary guidelines has been provided, but patient and his wife admit to making poor nutritional choices at times.  Plan: Patient will start Trulicity to improve self-management of [place].          Patient will improve adherence to diabetic dietary guidelines.          RN will follow up in October.  Richard Mile, RN, MSN [name] [phone] Fax [phone]

## 2017-01-11 NOTE — Patient Outreach (Signed)
[facility]) Care Management  01/11/2017  [name] [birth year] [id number]   71 year old male previously referred to [place] for medication cost assistance with his insulin and Xarelto.  [name] was approved through [facility] for his insulin but denied through [employer] for Xarelto.  [name] was also previously on Trulicity however this was discontinued due to cost.  I am following up with [name] as he had recent office visit with Dr. Renne Crigler and Trulicity resumed.    Unsuccessful telephone call to [name]. I left a HIPAA compliant voicemail on his home phone and cell phone.    Plan: I will try patient again later this week unless I hear back from his beforehand.   [name], PharmD, [place] [phone]    Addendum: I received a return call from patient's spouse. HIPAA identifiers verified. [name] reports [name] has been restarted on Trulicity after recent appointment with Dr. Renne Crigler on 01/06/17 and has ordered a refill of Trulicity already through mail order pharmacy, [facility].  [name] is agreeable to apply for Trulicity through [employer] for [place].  She may attempt to call [facility] to hold mail order prescription while [employer] application is being processed.    Plan: I will fax PAP application to Dr. Renne Crigler and follow-up with office later this week if I have not received application back yet.  [name], PharmD, [place] [phone]

## 2017-01-12 ENCOUNTER — Telehealth: Payer: Self-pay | Admitting: Internal Medicine

## 2017-01-12 NOTE — Telephone Encounter (Signed)
Wanted to let [name] know that forms for patient assistance for trulicity have been faxed to the office.

## 2017-01-12 NOTE — Telephone Encounter (Signed)
Routing to you °

## 2017-01-12 NOTE — Telephone Encounter (Signed)
Just wanted to let you know that the patient assistance paperwork has been faxed

## 2017-01-17 ENCOUNTER — Telehealth: Payer: Self-pay | Admitting: Pharmacist

## 2017-01-17 NOTE — Telephone Encounter (Signed)
Do you know where the paperwork is? I dont see it in my fax pile.

## 2017-01-17 NOTE — Telephone Encounter (Signed)
Paperwork was not received for pt assistance. Please re-fax 1 [phone].  New to Trulicity.  Ty,  -LL

## 2017-01-17 NOTE — Telephone Encounter (Signed)
Can you please re-fax this

## 2017-01-17 NOTE — Patient Outreach (Signed)
[facility]) Care Management  01/17/2017  AWAD GLADD June 15, 1945 [id number]  Patient assistance paperwork for Trulicity faxed to Dr. Arman Filter office on 01/12/2017.  I have not received completed documents faxed back to [facility] yet.    I placed a call to Dr. Arman Filter office today for updates on patient assistance paperwork.  I left another message for the office regarding the application.    Plan: Follow-up later this week if I have not received patient assistance faxes back yet.   [name], PharmD, [place] [phone]

## 2017-01-18 ENCOUNTER — Other Ambulatory Visit: Payer: Self-pay | Admitting: Cardiovascular Disease

## 2017-01-18 NOTE — Telephone Encounter (Addendum)
Request received for Xarelto 20mg , pt is 71 yrs old, wt-112kg, last seen by Dr. Angelena Form on 11/05/16, Crea-1.30 on 10/06/16 via PCP office  PA-[place], CrCL-82.46ml/min. Will send in refill request to requested Pharmacy.

## 2017-01-19 ENCOUNTER — Other Ambulatory Visit: Payer: Self-pay | Admitting: Pharmacist

## 2017-01-19 NOTE — Patient Outreach (Signed)
[place] [facility]) Care Management  01/19/2017  [name] [birth year] [id number]   [employer] patient assistance documents for Trulicity received from Dr. Arman Filter office.    I faxed completed forms to [facility] and received successful transmission.    Plan: Follow-up on application status with Gean [place] on Friday, Sept 14th, 2018.   [name], PharmD, [place] [phone]

## 2017-01-21 ENCOUNTER — Ambulatory Visit: Payer: Self-pay | Admitting: Pharmacist

## 2017-01-21 ENCOUNTER — Other Ambulatory Visit: Payer: Self-pay | Admitting: Pharmacist

## 2017-01-21 NOTE — Patient Outreach (Signed)
[place] [facility]) Care Management  01/21/2017  [name] [birth year] [id number]    Care coordination call placed to [facility] for an update on Trulicity application.  [employer] is still processing application and recommends to call back on Monday or Tuesday.   Plan: I will call [employer] early next week for another update.  [name], PharmD, [place] [phone]

## 2017-01-24 NOTE — Telephone Encounter (Signed)
It was refaxed

## 2017-01-24 NOTE — Telephone Encounter (Signed)
Has this been resolved?

## 2017-01-25 ENCOUNTER — Other Ambulatory Visit: Payer: Self-pay | Admitting: Pharmacist

## 2017-01-25 DIAGNOSIS — H6061 Unspecified chronic otitis externa, right ear: Secondary | ICD-10-CM | POA: Diagnosis not present

## 2017-01-25 DIAGNOSIS — H903 Sensorineural hearing loss, bilateral: Secondary | ICD-10-CM | POA: Diagnosis not present

## 2017-01-25 DIAGNOSIS — H6121 Impacted cerumen, right ear: Secondary | ICD-10-CM | POA: Diagnosis not present

## 2017-01-25 NOTE — Patient Outreach (Signed)
[place] [facility]) Care Management  01/25/2017  ROOK MAUE 1946-01-30 [id number]   Care coordination call placed to [facility].  Patient has been approved for Trulicity through 01/30/3006.  Medication will be shipped to Dr. Arman Filter office in the next 7-14 days.    Successful telephone call to patient this morning. HIPAA identifiers verified. Patient updated on Trulicity application approval and encouraged to follow-up with Dr. Arman Filter office to pick up medication after it has been delivered.    Plan: I will follow-up with patient in 2 weeks to ensure medication has been delivered and picked up.    [name], PharmD, [place] [phone]

## 2017-02-08 ENCOUNTER — Other Ambulatory Visit: Payer: Self-pay | Admitting: Pharmacist

## 2017-02-08 ENCOUNTER — Ambulatory Visit: Payer: Self-pay | Admitting: Pharmacist

## 2017-02-08 ENCOUNTER — Telehealth: Payer: Self-pay | Admitting: Internal Medicine

## 2017-02-08 NOTE — Patient Outreach (Signed)
[place] [facility]) Care Management  02/08/2017  [name] 1946/02/22 [id number]   Successful phone call with patient today.  HIPAA identifiers verified.  Patient has not received any phone-call from Dr. Arman Filter office regarding the arrival of Trulicity.   Care Coordination call placed to Dr. Arman Filter office.  Message left for RN to return call regarding arrival of Trulicity for patient.   Care Coordination call placed to [facility].  Application placed on hold due to missing information on HCP document with state license number.  Once this information is re-faxed, Trulicity will be expedited to provider office.    Plan: I will obtain state license number for Dr. Cruzita Lederer and re-submit document to [employer].   [name], PharmD, [place] [phone]

## 2017-02-08 NOTE — Telephone Encounter (Signed)
I called and spoke with [employer], they are still working on paperwork, looking to expect the medication next week.  

## 2017-02-08 NOTE — Telephone Encounter (Signed)
[employer] called to see if the TRULICITY came in through the mail to the office for the patient? Call number provided to advise.  °

## 2017-02-09 ENCOUNTER — Ambulatory Visit: Payer: Self-pay | Admitting: Pharmacist

## 2017-02-10 ENCOUNTER — Ambulatory Visit: Payer: Self-pay

## 2017-02-10 ENCOUNTER — Ambulatory Visit: Payer: Self-pay | Admitting: Pharmacist

## 2017-02-23 ENCOUNTER — Other Ambulatory Visit: Payer: Self-pay | Admitting: *Deleted

## 2017-02-23 NOTE — Patient Outreach (Signed)
[place] [facility]) Care Management  02/23/2017  FENTON CANDEE 1946-02-25 [id number]   [place] Monthly Outreach  Referral Date: 09/29/16 Referral Source: self referral Reason for Referral: Not able to afford prescriptions due to cost Insurance: [employer]   Outreach Attempt: Outreach attempt #1 to patient. No answer. RN Health Coach left HIPAA compliant voicemail message along with contact information.  Plan:  RN Health Coach will make another outreach attempt in the month of October.   [place] Coach [phone] [email or web address]

## 2017-02-24 ENCOUNTER — Other Ambulatory Visit: Payer: Self-pay | Admitting: Pharmacist

## 2017-02-24 NOTE — Patient Outreach (Signed)
[place] [facility]) Care Management  02/24/2017  [name] [birth year] [id number]  Successful phone-call to patient's spouse, [name], today.  HIPAA identifiers verified..  Patient has picked up his Trulicity from Dr. Arman Filter office this week.  Refill has been requested from [facility] for Novolog 70/30.  No further medication assistance needs at this time.  Patient's spouse denies any other medication questions or concerns.   Plan: [place] will close patient's case at this time but am happy to assist in the future as needed.   I will alert [place] of pharmacy case closure.   [name], PharmD, [place] [phone]

## 2017-02-25 ENCOUNTER — Other Ambulatory Visit: Payer: Self-pay | Admitting: *Deleted

## 2017-02-25 NOTE — Patient Outreach (Signed)
[facility]) Care Management  02/25/2017  RISHITH SIDDOWAY 11-12-1945 [id number]   [place] Monthly Outreach  Referral Date: 09/29/16 Referral Source: self referral Reason for Referral: Not able to afford prescriptions due to cost Insurance: [employer]   Outreach Attempt: Outreach attempt #2 to patient. No answer. RN Health Coach left HIPAA compliant voicemail message along with contact information.  Plan:  RN Health Coach will make another outreach attempt in the month of October.   [place] Coach [phone] [email or web address]

## 2017-03-02 ENCOUNTER — Other Ambulatory Visit: Payer: Self-pay | Admitting: *Deleted

## 2017-03-02 ENCOUNTER — Encounter: Payer: Self-pay | Admitting: *Deleted

## 2017-03-02 NOTE — Patient Outreach (Signed)
[place] [facility]) Care Management  03/02/2017  [name] [birth year] [id number]   [place] Monthly Outreach  Referral Date: 09/29/16 Referral Source: self referral Reason for Referral: Not able to afford prescriptions due to cost Insurance: [employer]  Outreach Attempt: Successful monthly telephone outreach to patient and wife.  HIPAA verified with wife per patient's request.  Wife verifies patient has received his Trulicity and is taking as prescribed.  Patient and wife reports since Saturday patient has had a flare of diverticulitis with nausea and emesis.  During this time wife reports patient's blood sugars have been a little elevated.  Fasting CBG this morning was 127 and wife states fasting range is 100-130's.  Patient reports his stomach is starting to feel better.  Appointments: Patient has follow up appointment with Dr. Cruzita Lederer, Endocrinologist on November 30.  He follows up with primary care provider on December 5.  Patient and wife encouraged to keep appointments.  Plan:  RN Health Coach to make monthly telephone outreach to patient in the month of November.   [place] [phone] [name]

## 2017-03-03 ENCOUNTER — Other Ambulatory Visit: Payer: Self-pay | Admitting: Cardiovascular Disease

## 2017-03-04 DIAGNOSIS — K219 Gastro-esophageal reflux disease without esophagitis: Secondary | ICD-10-CM | POA: Diagnosis not present

## 2017-03-04 DIAGNOSIS — R1030 Lower abdominal pain, unspecified: Secondary | ICD-10-CM | POA: Diagnosis not present

## 2017-03-04 DIAGNOSIS — D509 Iron deficiency anemia, unspecified: Secondary | ICD-10-CM | POA: Diagnosis not present

## 2017-03-04 DIAGNOSIS — K5792 Diverticulitis of intestine, part unspecified, without perforation or abscess without bleeding: Secondary | ICD-10-CM | POA: Diagnosis not present

## 2017-03-10 ENCOUNTER — Other Ambulatory Visit: Payer: Self-pay | Admitting: Gastroenterology

## 2017-03-10 ENCOUNTER — Ambulatory Visit
Admission: RE | Admit: 2017-03-10 | Discharge: 2017-03-10 | Disposition: A | Discharge status: Home / Self Care | Payer: Medicare Other | Source: Ambulatory Visit | Attending: Gastroenterology | Admitting: Gastroenterology

## 2017-03-10 DIAGNOSIS — K5792 Diverticulitis of intestine, part unspecified, without perforation or abscess without bleeding: Secondary | ICD-10-CM | POA: Diagnosis not present

## 2017-03-10 DIAGNOSIS — I714 Abdominal aortic aneurysm, without rupture: Secondary | ICD-10-CM | POA: Diagnosis not present

## 2017-03-10 MED ORDER — IOHEXOL 300 MG/ML  SOLN
30.0000 mL | Freq: Once | INTRAMUSCULAR | Status: AC | PRN
  Administered 2017-03-10: 30 mL via ORAL

## 2017-03-14 DIAGNOSIS — K5792 Diverticulitis of intestine, part unspecified, without perforation or abscess without bleeding: Secondary | ICD-10-CM | POA: Diagnosis not present

## 2017-03-14 DIAGNOSIS — K56699 Other intestinal obstruction unspecified as to partial versus complete obstruction: Secondary | ICD-10-CM | POA: Diagnosis not present

## 2017-03-14 DIAGNOSIS — K5900 Constipation, unspecified: Secondary | ICD-10-CM | POA: Diagnosis not present

## 2017-03-28 ENCOUNTER — Encounter: Payer: Self-pay | Admitting: *Deleted

## 2017-03-28 ENCOUNTER — Other Ambulatory Visit: Payer: Self-pay | Admitting: *Deleted

## 2017-03-28 DIAGNOSIS — Z8669 Personal history of other diseases of the nervous system and sense organs: Secondary | ICD-10-CM | POA: Insufficient documentation

## 2017-03-28 DIAGNOSIS — I4891 Unspecified atrial fibrillation: Secondary | ICD-10-CM | POA: Insufficient documentation

## 2017-03-28 DIAGNOSIS — K5792 Diverticulitis of intestine, part unspecified, without perforation or abscess without bleeding: Secondary | ICD-10-CM | POA: Insufficient documentation

## 2017-03-28 NOTE — Patient Outreach (Signed)
[place] [facility]) Care Management  03/28/2017  TYE VIGO 1945/07/07 [id number]  RN Health Coach Monthly Outreach  Referral Date: 09/29/16 Referral Source: self referral Reason for Referral: Not able to afford prescriptions due to cost Insurance: [employer]   Outreach Attempt: Successful outreach to patient and wife.  HIPAA confirmed with wife per patient's request.  Spoke with patient's wife.  States patient continues to have diverticulitis flare.  Has seen Gastroenterolist x2 in the last month.  Reports patient is on his second round of antibiotics.  Continues to have cycles of diarrhea alternating with constipation.  On the days he is constipated he has episodes of emesis and unable to eat.  The days he is unable to eat he does not take his insulin.  Fasting CBG this morning was 147.  Reports fasting blood sugars have been elevated due to not being able to take insulin regularly with the emesis and not eating.  Appointments: Wife reports patient has follow up appointment with Gastroenterology on November 26 to check his diverticulitis.  See Dr. Renne Crigler Endocrinologist on November 30.  Patient sees PA Tobie Lords on December 5 for a physical.  Plan:  [place] will make monthly telephone outreach in the month on December.   [place] [phone] [email or web address]

## 2017-04-04 ENCOUNTER — Other Ambulatory Visit: Payer: Self-pay | Admitting: Physician Assistant

## 2017-04-04 DIAGNOSIS — K5792 Diverticulitis of intestine, part unspecified, without perforation or abscess without bleeding: Secondary | ICD-10-CM

## 2017-04-04 DIAGNOSIS — R1032 Left lower quadrant pain: Secondary | ICD-10-CM

## 2017-04-04 DIAGNOSIS — K5900 Constipation, unspecified: Secondary | ICD-10-CM | POA: Diagnosis not present

## 2017-04-04 DIAGNOSIS — R933 Abnormal findings on diagnostic imaging of other parts of digestive tract: Secondary | ICD-10-CM | POA: Diagnosis not present

## 2017-04-04 DIAGNOSIS — Z86010 Personal history of colonic polyps: Secondary | ICD-10-CM | POA: Diagnosis not present

## 2017-04-05 ENCOUNTER — Ambulatory Visit
Admission: RE | Admit: 2017-04-05 | Discharge: 2017-04-05 | Disposition: A | Discharge status: Home / Self Care | Payer: Medicare Other | Source: Ambulatory Visit | Attending: Physician Assistant | Admitting: Physician Assistant

## 2017-04-05 DIAGNOSIS — R1032 Left lower quadrant pain: Secondary | ICD-10-CM

## 2017-04-05 DIAGNOSIS — K5732 Diverticulitis of large intestine without perforation or abscess without bleeding: Secondary | ICD-10-CM | POA: Diagnosis not present

## 2017-04-05 DIAGNOSIS — K5792 Diverticulitis of intestine, part unspecified, without perforation or abscess without bleeding: Secondary | ICD-10-CM

## 2017-04-05 MED ORDER — IOPAMIDOL (ISOVUE-300) INJECTION 61%
100.0000 mL | Freq: Once | INTRAVENOUS | Status: AC | PRN
  Administered 2017-04-05: 100 mL via INTRAVENOUS

## 2017-04-08 ENCOUNTER — Other Ambulatory Visit: Payer: Medicare Other

## 2017-04-08 ENCOUNTER — Ambulatory Visit: Payer: Medicare Other | Admitting: Internal Medicine

## 2017-04-08 ENCOUNTER — Encounter: Payer: Self-pay | Admitting: Internal Medicine

## 2017-04-08 VITALS — BP 112/70 | HR 83 | Wt 235.8 lb

## 2017-04-08 DIAGNOSIS — E785 Hyperlipidemia, unspecified: Secondary | ICD-10-CM | POA: Diagnosis not present

## 2017-04-08 DIAGNOSIS — E1151 Type 2 diabetes mellitus with diabetic peripheral angiopathy without gangrene: Secondary | ICD-10-CM

## 2017-04-08 DIAGNOSIS — E1165 Type 2 diabetes mellitus with hyperglycemia: Secondary | ICD-10-CM | POA: Diagnosis not present

## 2017-04-08 DIAGNOSIS — IMO0002 Reserved for concepts with insufficient information to code with codable children: Secondary | ICD-10-CM

## 2017-04-08 DIAGNOSIS — D649 Anemia, unspecified: Secondary | ICD-10-CM | POA: Diagnosis not present

## 2017-04-08 DIAGNOSIS — R933 Abnormal findings on diagnostic imaging of other parts of digestive tract: Secondary | ICD-10-CM | POA: Diagnosis not present

## 2017-04-08 DIAGNOSIS — K5792 Diverticulitis of intestine, part unspecified, without perforation or abscess without bleeding: Secondary | ICD-10-CM | POA: Diagnosis not present

## 2017-04-08 DIAGNOSIS — R195 Other fecal abnormalities: Secondary | ICD-10-CM | POA: Diagnosis not present

## 2017-04-08 DIAGNOSIS — R1032 Left lower quadrant pain: Secondary | ICD-10-CM | POA: Diagnosis not present

## 2017-04-08 LAB — POCT GLYCOSYLATED HEMOGLOBIN (HGB A1C): Hemoglobin A1C: 6.3

## 2017-04-08 MED ORDER — INSULIN ISOPHANE & REGULAR (HUMAN 70-30)100 UNIT/ML KWIKPEN: PEN_INJECTOR | SUBCUTANEOUS | 3 refills | Status: DC

## 2017-04-08 NOTE — Addendum Note (Signed)
Addended by: Drucilla Schmidt on: 04/08/2017 11:35 AM   Modules accepted: Orders

## 2017-04-08 NOTE — Progress Notes (Signed)
Patient ID: [name], male   DOB: July 25, 1945, 71 y.o.   MRN: [id number]  HPI: [name] is a 71 y.o.-year-old male, returning for f/u for DM2, dx 1995, insulin-dependent, uncontrolled, with complications (CKD stage 3, CAD - AMI, s/p stent LAD 2001). Last visit 3 mo ago.  He is here with his wife for first part of the history, especially about insulin dosing and his past medical history.  He continues to have diverticulitis flares (sees Dr. Makaelyn Aponte Gong) >> was on several antibiotic courses. He has N/V/D/C and lost 12 lbs since last visit.    He has appt with PCP soon >> will have annual labs then.  Last hemoglobin A1c was: Lab Results  Component Value Date   HGBA1C 6.8 01/06/2017   HGBA1C 6.9 09/27/2016   HGBA1C 6.6 06/29/2016   He is on on: - Humulin 70/30 - inject 30 min before meals: 20 >> 25 >> 20 units before b'fast 25 >> 30 >> 25 units before dinner - Metformin-glipizide 500-2.5 mg 1-2 tabs 2x a day - Trulicity 1.5 mg weekly He was on Tanzeum 50 mg weekly (started 09/2015) - >200$ per month! >> Trulicity 1.5 mg weekly >> this is too expensive!  Pt checks his sugars 2 times a day >> sugars worse in last month 2/2 diverticulitis: - am: 129-167, 184 >> 108, 125-173 >> 92-147, in last week 205, 211 - 2h after b'fast: 131 >> n/c >> 223 >> n/c - before lunch: 198, 215 >> n/c - 2h after lunch: 179 >> n/c >> 184, 231 >> 179-228 - before dinner: 137-218, 240 >> 133-242 >> 74-185, 202, 225 - 2h after dinner: 203, 220 >> n/c >> 176 >> 196 - bedtime:88, 113-192, 240 >> 129, 162 >> n/c - nighttime: n/c >> 168 >> 43-67, 156 Lowest sugar was 85 >> 69 >> 108 >> 43 at night (was on clear liquids); he does not have hypoglycemia unawareness! Highest sugar was 243 >> 319 >> 242 >> 228.  Pt's meals are: - Breakfast: eggs, toast, ham/sausage or cereals + milk - Lunch: sandwich, vegetables, meat - Dinner: seafood, meat, vegetables - Snacks: nabs He exercises 1x every 2 weeks.  Reviewed  labs from PCP: 09/30/2015: - Hep C virus antibody <0.1 - Lipids: 126/68/44/68 - CMP normal, except glucose 209, BUN/creatinine 22/1.39, GFR 51, potassium 5.4 (3.5-5.2), CO2 17 (18-29) - CBC with differential normal - B12 vitamin 305 (84-166)  04/01/2015: - ACR 56.3 - CBC with differential normal - Lipids: 116/56/42/63 - CMP: Glucose 193, BUN/creatinine 21/1.23, GFR 60, LFTs normal - PSA 0.5    -+ CKD stage III: Lab Results  Component Value Date   BUN 14 03/28/2014   CREATININE 1.4 (A) 03/28/2014  On ramipril. -+ HL; lipids: Lab Results  Component Value Date   CHOL 131 03/28/2014   HDL 47 03/28/2014   LDLCALC 68 03/28/2014   TRIG 82 03/28/2014  On Lipitor.  - last eye exam was in 04/2016: No DR.   - Denies numbness and tingling in his feet.   ROS: Constitutional: + weight loss, no fatigue, no subjective hyperthermia, no subjective hypothermia Eyes: no blurry vision, no xerophthalmia ENT: no sore throat, no nodules palpated in throat, no dysphagia, no odynophagia, no hoarseness Cardiovascular: no CP/no SOB/no palpitations/no leg swelling Respiratory: no cough/no SOB/no wheezing Gastrointestinal: + all: N/V/D Musculoskeletal: no muscle aches/no joint aches Skin: no rashes, no hair loss Neurological: no tremors/no numbness/no tingling/no dizziness  I reviewed pt's medications, allergies, PMH, social hx,  family hx, and changes were documented in the history of present illness. Otherwise, unchanged from my initial visit note.  Past Medical History:  Diagnosis Date  . AAA (abdominal aortic aneurysm) ([place])   . Allergy   . Atrial fibrillation [facility]) October 2013   Xarelto started; duration unknown  . Cataract   . Chronic kidney disease    Stage III   . Coronary artery disease    post bare-metal stent to LAD in 2001  . Diabetes mellitus   . Hypertension   . Myocardial infarct ([place]) 2001  . Obesity    Past Surgical History:  Procedure Laterality Date  . CARDIAC  CATHETERIZATION  2006   stable with primarily nonobstructive disease  . CARDIOVERSION  03/27/2012   Procedure: CARDIOVERSION;  Surgeon: Carlena Bjornstad, MD;  Location: [facility] ENDOSCOPY;  Service: Cardiovascular;  Laterality: N/A;  . CORONARY ANGIOPLASTY WITH STENT PLACEMENT  2001   bare-metal stent to the LAD  . HERNIA REPAIR  2006  . SKIN GRAFT  1981   Social History Main Topics  . Smoking status: Former Smoker -- 0.50 packs/day for 40 years    Types: Cigarettes, Pipe, Cigars    Quit date: 05/11/1999  . Smokeless tobacco: Never Used  . Alcohol Use: No  . Drug Use: No   Social History Narrative   Married    Retired: [employer]   3 grown children   Current Outpatient Medications on File Prior to Visit  Medication Sig Dispense Refill  . atorvastatin (LIPITOR) 20 MG tablet Take 20 mg by mouth daily.     . Dulaglutide (TRULICITY) 1.5 IR/4.4RX SOPN Please inject under skin 1.5 mg weekly 12 pen 3  . glipiZIDE-metformin (METAGLIP) 2.5-500 MG tablet TAKE TWO TABLETS BY MOUTH  TWICE DAILY BEFORE MEALS 360 tablet 1  . Insulin Isophane & Regular Human (HUMULIN 70/30 MIX) (70-30) 100 UNIT/ML PEN Inject under skin 20 units before b'fast and 25 units before dinner 45 mL 3  . Insulin Pen Needle (RELION PEN NEEDLES) 32G X 4 MM MISC Use 2x a day as advised 200 each 3  . metoprolol tartrate (LOPRESSOR) 25 MG tablet TAKE 1 TABLET BY MOUTH TWO  TIMES DAILY 180 tablet 2  . nitroGLYCERIN (NITROSTAT) 0.4 MG SL tablet Place 0.4 mg under the tongue every 5 (five) minutes as needed.    Marland Kitchen omeprazole (PRILOSEC) 20 MG capsule Take 20 mg by mouth daily. Reported on 11/13/2015    . Probiotic Product (PROBIOTIC-10 PO) Take by mouth.    . ramipril (ALTACE) 10 MG capsule Take 10 mg by mouth daily.    Marland Kitchen saccharomyces boulardii (FLORASTOR) 250 MG capsule Take 250 mg by mouth daily.     Alveda Reasons 20 MG TABS tablet TAKE 1 TABLET BY MOUTH  DAILY 90 tablet 2   No current facility-administered medications on file prior to visit.     Allergies  Allergen Reactions  . Omeprazole Diarrhea   Family History  Problem Relation Age of Onset  . Heart disease Father   . Heart attack Father   . ALS Mother   . Diabetes Sister   . Hypertension Sister   . Heart failure Brother   . Stroke Neg Hx    PE: BP 112/70   Pulse 83   Wt 235 lb 12.8 oz (107 kg)   SpO2 96%   BMI 31.11 kg/m  Body mass index is 31.11 kg/m. Wt Readings from Last 3 Encounters:  04/08/17 235 lb 12.8 oz (107 kg)  01/11/17 247 lb (112 kg)  01/06/17 247 lb (112 kg)   Constitutional: overweight, in NAD Eyes: PERRLA, EOMI, no exophthalmos ENT: moist mucous membranes, no thyromegaly, no cervical lymphadenopathy Cardiovascular: RRR, No MRG Respiratory: CTA B Gastrointestinal: abdomen soft, NT, ND, BS+ Musculoskeletal: no deformities, strength intact in all 4 Skin: moist, warm, no rashes Neurological: no tremor with outstretched hands, DTR normal in all 4  ASSESSMENT: 1. DM2, insulin-dependent, uncontrolled, with complications - CAD - CKD stage 3  PLAN:  1. Patient with long-standing, previously uncontrolled diabetes, now with better control, on the basal-bolus insulin regimen, also metformin-glipizide and GLP-1 receptor agonist added at last visit.  He is getting Trulicity through the patient assistant program.  At last visit, we backed off the insulin doses. - He continues to have problems with diverticulitis-and he still cannot eat well, therefore, his skips some of the insulin doses.  Therefore, sugars are higher. - at this visit, will decrease the dose of his Metformin-Glipizide combination and I am wondering whether we also need to stop Trulicity. His sxs started before we started Trulicity, but he has exacerbations now. - I will also advise him to decrease 70/30 insulin dose at night as he had some lows in the middle of the night in last 2 weeks as not eating well. We can go back to the previous doses when he starts eating better - I suggested  to:  Patient Instructions  Please  decrease: - Metformin-glipizide 500-2.5 mg 1 tablet 2x a day  Continue: - Trulicity 1.5 mg weekly >> check with Dr. Landree Fernholz Gong if we can continue this  Decrease: - Insulin 70/30:  20 units before breakfast  20 units before dinner  Please return in 4 months with your sugar log.   - today, HbA1c is 6.3% (BETTER!) - continue checking sugars at different times of the day - check 2-3x a day, rotating checks - advised for yearly eye exams >> he is UTD - Return to clinic in 4 mo with sugar log    2. HL - He continues on Lipitor.  No side effects. - Reviewed recent lipid panel: LDL at goal -  Will have anew Lipid panel soon by PCP  Philemon Kingdom, MD PhD [facility] Endocrinology

## 2017-04-08 NOTE — Patient Instructions (Addendum)
Please  decrease: - Metformin-glipizide 500-2.5 mg 1 tablet 2x a day  Continue: - Trulicity 1.5 mg weekly >> check with Dr. Kabeer Hoagland Gong if we can continue this  Decrease: - Insulin 70/30:  20 units before breakfast  20 units before dinner  Please return in 4 months with your sugar log.

## 2017-04-13 DIAGNOSIS — E119 Type 2 diabetes mellitus without complications: Secondary | ICD-10-CM | POA: Diagnosis not present

## 2017-04-13 DIAGNOSIS — I4891 Unspecified atrial fibrillation: Secondary | ICD-10-CM | POA: Diagnosis not present

## 2017-04-13 DIAGNOSIS — Z125 Encounter for screening for malignant neoplasm of prostate: Secondary | ICD-10-CM | POA: Diagnosis not present

## 2017-04-13 DIAGNOSIS — D509 Iron deficiency anemia, unspecified: Secondary | ICD-10-CM | POA: Diagnosis not present

## 2017-04-13 DIAGNOSIS — E782 Mixed hyperlipidemia: Secondary | ICD-10-CM | POA: Diagnosis not present

## 2017-04-13 DIAGNOSIS — I10 Essential (primary) hypertension: Secondary | ICD-10-CM | POA: Diagnosis not present

## 2017-04-13 DIAGNOSIS — N183 Chronic kidney disease, stage 3 (moderate): Secondary | ICD-10-CM | POA: Diagnosis not present

## 2017-04-13 DIAGNOSIS — I2510 Atherosclerotic heart disease of native coronary artery without angina pectoris: Secondary | ICD-10-CM | POA: Diagnosis not present

## 2017-04-15 ENCOUNTER — Encounter: Payer: Self-pay | Admitting: Internal Medicine

## 2017-04-15 ENCOUNTER — Telehealth: Payer: Self-pay | Admitting: Internal Medicine

## 2017-04-15 NOTE — Progress Notes (Signed)
Received labs from PCP from 04/13/2017: - Lipids: 124/65/39/72 - CMP normal, glucose 98, BUN/creatinine 10/1.3, GFR 55, potassium 3.4 (3.5-5.2), Ca 8.1 (8.6-10.2) - CBC with differential: low Hb 11.3 (13-17.7), low RBC 3.57 (4.14-5.8) - ferritin 167 - PSA 0.5

## 2017-04-15 NOTE — Telephone Encounter (Signed)
[employer] and they stated that it has not been shipped yet. States itll be another 5-7 business days before it will arrive

## 2017-04-15 NOTE — Telephone Encounter (Signed)
Pt mention that [employer] was suppose to send in insulin to the office for Pt and Pt wife to pick up. She spoke with julie at their last visit.     Please advise

## 2017-04-19 ENCOUNTER — Ambulatory Visit: Payer: Medicare Other | Admitting: Internal Medicine

## 2017-04-21 ENCOUNTER — Ambulatory Visit: Payer: Self-pay | Admitting: Surgery

## 2017-04-21 DIAGNOSIS — K56699 Other intestinal obstruction unspecified as to partial versus complete obstruction: Secondary | ICD-10-CM | POA: Diagnosis not present

## 2017-04-21 NOTE — H&P (Signed)
History of Present Illness Richard Gave M. Birdie Beveridge MD; 04/21/2017 4:01 PM) Patient words: CC: Referred to [place] for evaluation by Dr. Cristina Gong for possible diverticular stricture  HPI: [name] is a very pleasant 71yo gentleman referred to [place] for evaluation of sigmoid colon stricture. over the last 3-4 years he has a history of multiple bouts of "diverticulitis" often manage an outpatient setting with oral antibiotics.  His symptoms have been left lower quadrant discomfort, crampiness, occasional chills and his symptoms seemed to resolve in the past with antibiotics.  He is unclear as to how many of these episodes he has had.  Approximately 8 weeks ago he had increased left lower quadrant discomfort which again was crampy in nature.  He would have a day or 2 with no bowel movements, still passing gas, but then a 3-4 day period of diarrhea. He was given a course of oral antibiotics and his symptoms have steadily resolved.  He is no longer having issues with significant discomfort nor problems with bowel activity.  He does have 1-2 bowel movements per day and they are soft/formed.  He is tolerating regular diet without nausea or vomiting. Imaging workup thus far: CT A/P 06/17/16 - focal wall thickening with pericolonic inflammatory changes of the sigmoid colon likely representing acute diverticulitis. Barium enema 08/17/16: Tight narrowing of the sigmoid colon near the junction with the descending colon consistent with either stricture or tumor.  The rectum became [facility] study was terminated and the more proximal colon could not be assessed.  Multiple diverticula were seen in the rectosigmoid colon CT A/P 03/10/17: mild pericolonic inflammatory changes most evident involving the mid sigmoid colon where there is a relative narrowing.  Small associated adjacent lymph nodes.  Colonic malignancy is not excluded but felt less likely.  Mid-sigmoid colon changes lead to distention of the colon proximally with moderate  increased stool CT A/P 04/06/17: persisting, suspicious soft tissue mass within the sigmoid colon with proximal increase caliber of large bowel  Last full colonoscopy was approximately 40yr ago per the patient and he had polyps that were removed but was told all came back negative for cancer Flex sig 08/2016 - went into splenic flexure, 45cm and no intraluminal abnormalities were noted  Today he reports feeling well. Having daily BMs, tolerating diet, denies n/v/abdominal distention.   PMH: DM (controlled on insulin), HTN, HLD, CAD (s/p stent 2001), AFib on Xarelto  PSH: Umbilical/ventral hernia repair with Goretex mesh remotely  FHx: Denies FHx of malignancy  Social: Denies use of tobacco/EtOH/drugs  ROS: A comprehensive 10 system review of systems was completed with the patient and pertinent findings as noted above.  The patient is a 71 year old male.    Review of Systems Richard Gave M. Tiffanie Blassingame MD; 04/21/2017 4:00 PM) General Present- Weight Loss. Not Present- Appetite Loss, Chills, Fatigue, Fever, Night Sweats and Weight Gain. Note:  All other systems negative (unless as noted in HPI & included Review of Systems) Skin Not Present- Change in Wart/Mole, Dryness, Hives, Jaundice, New Lesions, Non-Healing Wounds, Rash and Ulcer. HEENT Not Present- Earache, Hearing Loss, Hoarseness, Nose Bleed, Oral Ulcers, Ringing in the Ears, Seasonal Allergies, Sinus Pain, Sore Throat, Visual Disturbances, Wears glasses/contact lenses and Yellow Eyes. Respiratory Not Present- Bloody sputum, Chronic Cough, Difficulty Breathing, Snoring and Wheezing. Cardiovascular Not Present- Chest Pain, Difficulty Breathing Lying Down, Leg Cramps, Palpitations, Rapid Heart Rate, Shortness of Breath and Swelling of Extremities. Gastrointestinal Present- Abdominal Pain and Change in Bowel Habits. Not Present- Bloating, Bloody Stool, Chronic  diarrhea, Constipation, Difficulty Swallowing, Excessive gas, Gets full quickly at  meals, Hemorrhoids, Indigestion, Nausea, Rectal Pain and Vomiting. Musculoskeletal Present- Back Pain and Joint Pain. Not Present- Joint Stiffness, Muscle Pain, Muscle Weakness and Swelling of Extremities. Neurological Not Present- Decreased Memory, Fainting, Headaches, Numbness, Seizures, Tingling, Tremor, Trouble walking and Weakness. Psychiatric Not Present- Anxiety, Bipolar, Depression, Fearful and Frequent crying. Endocrine Not Present- Cold Intolerance, Excessive Hunger, Hair Changes, Heat Intolerance and New Diabetes. Hematology Present- Blood Thinners. Not Present- Excessive bleeding, Gland problems, HIV and Persistent Infections.   Physical Exam Richard Gave M. Myrissa Chipley MD; 04/21/2017 4:02 PM)  The physical exam findings are as follows: Note: Constitutional: No acute distress; conversant; no deformities Eyes: Moist conjunctiva; no lid lag; anicteric sclerae; pupils equal round and reactive to light Neck: Trachea midline; no palpable thyromegaly Lungs: Normal respiratory effort; no tactile fremitus CV: Irregular rate, irregular rhythm; no palpable thrill; no pitting edema GI: Abdomen soft, nontender, nondistended; no r/g; no palpable hepatosplenomegaly. +Upper midline hernia, freely reducible. MSK: Normal gait; no clubbing/cyanosis Psychiatric: Appropriate affect; alert and oriented 3 Lymphatic: No palpable cervical or axillary lymphadenopathy **A chaperone, Lennart Pall, was present for the entire physical exam    Assessment & Plan Richard Gave M. Bladyn Tipps MD; 04/21/2017 4:07 PM)  DIVERTICULAR STRICTURE (K27.062) Impression: [name] is a very pleasant 71yo gentleman with hx of HTN, HLD, hypothyroidism, CAD, AFib on Xarelto - here today for evaluation of likely stricture of the sigmoid colon. Etiology is not completely clear at this point, but favored to be diverticular. There is a soft tissue mass associated with the sigmoid colon which could be 2/2 chronic/recurrent diverticulitis  vs neoplastic. The scope 08/2016 that demonstrated no intraluminal abnormalities up to 45cm is reassurring for this being more of an extrinsic effect/inflammatory mass, but could additionally be neoplastic -Will schedule for laparoscopic vs open sigmoid colectomy (anterior resection) with cysto/stents -Stoma RN for marking preop -Will need to obtain cardiac clearance and hold Xarelto perioperatively (at least 48hrs prior to surgery) -In the interim, I advised he avoid raw fruits and vegetables and other high fiber foods and begin taking MiraLAX daily. ER warnings were provided should symptoms return -The planned procedure, material risks (including, but not limited to, pain, bleeding, infection, scarring, need for blood transfusion, damage to surrounding structures- blood vessels/nerves/viscus/organs, damage to ureter, urine leak, leak from anastomosis, need for additional procedures, need for stoma which may be permanent, hernia, recurrence, pneumonia, heart attack, stroke, death) benefits and alternatives to surgery were discussed at length. I noted a good probability that the procedure would help improve his symptoms. He and his wife's questions were answered to their satisfaction and they elected to proceed with surgery.

## 2017-04-22 ENCOUNTER — Encounter: Payer: Self-pay | Admitting: Cardiovascular Disease

## 2017-04-22 ENCOUNTER — Ambulatory Visit: Payer: Medicare Other | Admitting: Cardiovascular Disease

## 2017-04-22 VITALS — BP 120/70 | HR 88 | Ht 73.0 in | Wt 237.2 lb

## 2017-04-22 DIAGNOSIS — R933 Abnormal findings on diagnostic imaging of other parts of digestive tract: Secondary | ICD-10-CM | POA: Diagnosis not present

## 2017-04-22 DIAGNOSIS — I2510 Atherosclerotic heart disease of native coronary artery without angina pectoris: Secondary | ICD-10-CM

## 2017-04-22 DIAGNOSIS — I10 Essential (primary) hypertension: Secondary | ICD-10-CM

## 2017-04-22 DIAGNOSIS — I481 Persistent atrial fibrillation: Secondary | ICD-10-CM | POA: Diagnosis not present

## 2017-04-22 DIAGNOSIS — R195 Other fecal abnormalities: Secondary | ICD-10-CM | POA: Diagnosis not present

## 2017-04-22 DIAGNOSIS — K56699 Other intestinal obstruction unspecified as to partial versus complete obstruction: Secondary | ICD-10-CM | POA: Diagnosis not present

## 2017-04-22 DIAGNOSIS — Z01810 Encounter for preprocedural cardiovascular examination: Secondary | ICD-10-CM | POA: Diagnosis not present

## 2017-04-22 DIAGNOSIS — R1032 Left lower quadrant pain: Secondary | ICD-10-CM | POA: Diagnosis not present

## 2017-04-22 DIAGNOSIS — I4819 Other persistent atrial fibrillation: Secondary | ICD-10-CM

## 2017-04-22 DIAGNOSIS — K5792 Diverticulitis of intestine, part unspecified, without perforation or abscess without bleeding: Secondary | ICD-10-CM | POA: Diagnosis not present

## 2017-04-22 NOTE — Patient Instructions (Signed)

## 2017-04-22 NOTE — Progress Notes (Signed)
Chief Complaint  Patient presents with  . Coronary Artery Disease    History of Present Illness: 71 yo male with history of CAD, HTN, hyperlipidemia, atrial fibrillation, AAA, mild mitral regurgitation and DM who is here today for cardiac follow up. In 2001 he had an anterior STEMI treated with a bare-metal stent in the LAD. He underwent catheterization in 2006 and the stent was patent and there was an 80% narrowing in the diagonal branch. His ejection fraction was 50%. He was noted to be in atrial fibrillation in October 2013 and was started on Xarelto and DCCV was arranged on 03/27/12 with return to NSR. He has since converted back to atrial fibrillation and has been controlled on metoprolol.  AAA followed in [facility]. Echo July 2018 with normal LV size and function with no significant valve disease.   He is here today for follow up. The patient denies any chest pain, dyspnea, palpitations, lower extremity edema, orthopnea, PND, dizziness, near syncope or syncope. He is having abdominal pain and has been told that he needs to have colon surgery. He had a sigmoidoscopy and has a mass in his colon.    Primary Care Physician: Cyndi Bender, PA-C  Past Medical History:  Diagnosis Date  . AAA (abdominal aortic aneurysm) ([place])   . Allergy   . Atrial fibrillation [facility]) October 2013   Xarelto started; duration unknown  . Cataract   . Chronic kidney disease    Stage III   . Coronary artery disease    post bare-metal stent to LAD in 2001  . Diabetes mellitus   . Hypertension   . Myocardial infarct ([place]) 2001  . Obesity     Past Surgical History:  Procedure Laterality Date  . CARDIAC CATHETERIZATION  2006   stable with primarily nonobstructive disease  . CARDIOVERSION  03/27/2012   Procedure: CARDIOVERSION;  Surgeon: Carlena Bjornstad, MD;  Location[facility] ENDOSCOPY;  Service: Cardiovascular;  Laterality: N/A;  . CORONARY ANGIOPLASTY WITH STENT PLACEMENT  2001   bare-metal stent to the LAD  .  HERNIA REPAIR  2006  . SKIN GRAFT  1981    Current Outpatient Medications  Medication Sig Dispense Refill  . atorvastatin (LIPITOR) 20 MG tablet Take 20 mg by mouth daily.     . Dulaglutide (TRULICITY) 1.5 IW/5.8KD SOPN Please inject under skin 1.5 mg weekly 12 pen 3  . glipiZIDE-metformin (METAGLIP) 2.5-500 MG tablet TAKE TWO TABLETS BY MOUTH  TWICE DAILY BEFORE MEALS 360 tablet 1  . Insulin Isophane & Regular Human (HUMULIN 70/30 MIX) (70-30) 100 UNIT/ML PEN Inject under skin 20 units before b'fast and 20 units before dinner 45 mL 3  . Insulin Pen Needle (RELION PEN NEEDLES) 32G X 4 MM MISC Use 2x a day as advised 200 each 3  . metoprolol tartrate (LOPRESSOR) 25 MG tablet TAKE 1 TABLET BY MOUTH TWO  TIMES DAILY 180 tablet 2  . nitroGLYCERIN (NITROSTAT) 0.4 MG SL tablet Place 0.4 mg under the tongue every 5 (five) minutes as needed.    Marland Kitchen omeprazole (PRILOSEC) 20 MG capsule Take 20 mg by mouth daily. Reported on 11/13/2015    . Probiotic Product (PROBIOTIC-10 PO) Take by mouth.    . ramipril (ALTACE) 10 MG capsule Take 10 mg by mouth daily.    Marland Kitchen saccharomyces boulardii (FLORASTOR) 250 MG capsule Take 250 mg by mouth daily.     Alveda Reasons 20 MG TABS tablet TAKE 1 TABLET BY MOUTH  DAILY 90 tablet 2  No current facility-administered medications for this visit.     Allergies  Allergen Reactions  . Omeprazole Diarrhea    Social History   Socioeconomic History  . Marital status: Married    Spouse name: Not on file  . Number of children: Not on file  . Years of education: Not on file  . Highest education level: Not on file  Social Needs  . Financial resource strain: Not on file  . Food insecurity - worry: Not on file  . Food insecurity - inability: Not on file  . Transportation needs - medical: Not on file  . Transportation needs - non-medical: Not on file  Occupational History  . Not on file  Tobacco Use  . Smoking status: Former Smoker    Packs/day: 0.50    Years: 40.00     Pack years: 20.00    Types: Cigarettes, Pipe, Cigars    Last attempt to quit: 05/11/1999    Years since quitting: 17.9  . Smokeless tobacco: Never Used  Substance and Sexual Activity  . Alcohol use: No  . Drug use: No  . Sexual activity: Yes  Other Topics Concern  . Not on file  Social History Narrative   Married    Retired: [employer]   3 grown children    Family History  Problem Relation Age of Onset  . Heart disease Father   . Heart attack Father   . ALS Mother   . Diabetes Sister   . Hypertension Sister   . Heart failure Brother   . Stroke Neg Hx     Review of Systems:  As stated in the HPI and otherwise negative.   BP 120/70   Pulse 88   Ht 6\' 1"  (1.854 m)   Wt 237 lb 3.2 oz (107.6 kg)   SpO2 95%   BMI 31.29 kg/m   Physical Examination:  General: Well developed, well nourished, NAD  HEENT: OP clear, mucus membranes moist  SKIN: warm, dry. No rashes. Neuro: No focal deficits  Musculoskeletal: Muscle strength 5/5 all ext  Psychiatric: Mood and affect normal  Neck: No JVD, no carotid bruits, no thyromegaly, no lymphadenopathy.  Lungs:Clear bilaterally, no wheezes, rhonci, crackles Cardiovascular: Irreg irreg.  No murmur.  Abdomen:Soft. Bowel sounds present. Non-tender.  Extremities: No lower extremity edema. Pulses are 2 + in the bilateral DP/PT.    Echo 11/17/16: .- Left ventricle: The cavity size was normal. There was moderate   concentric hypertrophy. Systolic function was normal. The   estimated ejection fraction was in the range of 50% to 55%. Mild   hypokinesis of the inferoseptal and apical inferior myocardium. - Aortic valve: Transvalvular velocity was within the normal range.   There was no stenosis. There was no regurgitation. - Mitral valve: Transvalvular velocity was within the normal range.   There was no evidence for stenosis. There was no regurgitation. - Left atrium: The atrium was severely dilated. - Right ventricle: The cavity size was  normal. Wall thickness was   normal. Systolic function was normal. - Right atrium: The atrium was severely dilated. - Atrial septum: No defect or patent foramen ovale was identified   by color flow Doppler. - Tricuspid valve: There was mild regurgitation. - Pulmonary arteries: Systolic pressure was within the normal   range. PA peak pressure: 35 mm Hg (S).  EKG:  EKG is  ordered today. The ekg ordered today demonstrates Atrial fib, rate 88 bpm. Non-specific ST abn. QTc 486 msec. Unchanged.   Recent  Labs: No results found for requested labs within last 8760 hours.   Lipid Panel Followed in primary care   Wt Readings from Last 3 Encounters:  04/22/17 237 lb 3.2 oz (107.6 kg)  04/08/17 235 lb 12.8 oz (107 kg)  01/11/17 247 lb (112 kg)     Other studies Reviewed: Additional studies/ records that were reviewed today include:  Review of the above records demonstrates:  Assessment and Plan:   1. CAD without angina: No chest pain suggestive of angina. Continue statin and beta blocker. He is not on an ASA since he is on Xarelto.  Echo July 2018 with normal LV size and function.   2. Atrial fibrillation, persistent: He is in atrial fib today. Rate is controlled. Continue beta blocker and Xarelto.   3. HYPERTENSION: BP controlled. NO changes.    4. Pre-operative cardiovascular examination: He has known CAD. No chest pain or unstable findings on exam. EKG is unchanged. He can proceed with his planned surgical procedure without further cardiac workup. He can hold Xarelto 3 days before his planned procedure.    Current medicines are reviewed at length with the patient today.  The patient does not have concerns regarding medicines.  The following changes have been made:  no change  Labs/ tests ordered today include:  Orders Placed This Encounter  Procedures  . EKG 12-Lead    Disposition:   FU with me in 6 months  Signed, Lauree Chandler, MD 04/22/2017 3:48 PM    [address], [place], [place]  [address] Phone: [phone]; Fax: [phone]

## 2017-04-25 ENCOUNTER — Telehealth: Payer: Self-pay

## 2017-04-25 NOTE — Telephone Encounter (Signed)
   Chart reviewed as part of pre-operative protocol coverage. Pre-op clearance was addressed in recent office visit with Dr. Angelena Form on 04/22/17. I will route a copy of his note to requesting party and remove this from pre-op pool. Will also route this message to them informing them of clearance provided by MD.  Charlie Pitter, PA-C 04/25/2017, 4:42 PM

## 2017-04-25 NOTE — Telephone Encounter (Signed)
   [facility] Pre-operative Risk Assessment    Request for surgical clearance:  1. What type of surgery is being performed? A procedure involving the colon   2. When is this surgery scheduled? Not schedule yet pending clearance   3. Are there any medications that need to be held prior to surgery and how long? Need instructions   4. Practice name and name of physician performing surgery? [facility]    5. What is your office phone and fax number? Phone: [phone] fax: [phone]   6. Anesthesia type (None, local, MAC, general) ? n/a   [name] 04/25/2017, 1:44 PM  _________________________________________________________________   (provider comments below)

## 2017-04-28 ENCOUNTER — Encounter: Payer: Self-pay | Admitting: Internal Medicine

## 2017-04-28 DIAGNOSIS — H25813 Combined forms of age-related cataract, bilateral: Secondary | ICD-10-CM | POA: Diagnosis not present

## 2017-04-28 DIAGNOSIS — H5203 Hypermetropia, bilateral: Secondary | ICD-10-CM | POA: Diagnosis not present

## 2017-04-28 LAB — HM DIABETES EYE EXAM

## 2017-04-29 ENCOUNTER — Other Ambulatory Visit: Payer: Self-pay | Admitting: *Deleted

## 2017-04-29 ENCOUNTER — Encounter: Payer: Self-pay | Admitting: *Deleted

## 2017-04-29 NOTE — Patient Outreach (Addendum)
[facility]) Care Management  [place]  04/29/2017   [name] Feb 15, 1946 [id number]  [place] Monthly Outreach  Referral Date: 09/29/16 Referral Source: self referral Reason for Referral: Not able to afford prescriptions due to cost Insurance: [employer]  Outreach Attempt:  Successful telephone outreach to patient for monthly follow up.  HIPAA verified with wife per patients request.  Patient has been diagnosed with a colon mass and is awaiting to be scheduled for colon surgery. Per wife, surgeon wants to do surgery as soon as possible.  At this time patient is able to keep solid foods down on occasions.  Continues to monitor blood sugars.  Fasting CBG this morning was 137 with ranges of 130's.  Last A1C was 6.3.  Reports some hypoglycemic events in the morning and nighttime insulin dose has been adjusted.  Patient states he still has bad days and is unable to eat solids and on those days he adjust his medications.  Encounter Medications:  Outpatient Encounter Medications as of 04/29/2017  Medication Sig Note  . atorvastatin (LIPITOR) 20 MG tablet Take 20 mg by mouth daily.    . Dulaglutide (TRULICITY) 1.5 OI/7.1IW SOPN Please inject under skin 1.5 mg weekly   . glipiZIDE-metformin (METAGLIP) 2.5-500 MG tablet TAKE TWO TABLETS BY MOUTH  TWICE DAILY BEFORE MEALS   . Insulin Isophane & Regular Human (HUMULIN 70/30 MIX) (70-30) 100 UNIT/ML PEN Inject under skin 20 units before b'fast and 20 units before dinner   . Insulin Pen Needle (RELION PEN NEEDLES) 32G X 4 MM MISC Use 2x a day as advised   . metoprolol tartrate (LOPRESSOR) 25 MG tablet TAKE 1 TABLET BY MOUTH TWO  TIMES DAILY   . nitroGLYCERIN (NITROSTAT) 0.4 MG SL tablet Place 0.4 mg under the tongue every 5 (five) minutes as needed.   Marland Kitchen omeprazole (PRILOSEC) 20 MG capsule Take 20 mg by mouth daily. Reported on 11/13/2015 03/02/2017: Taking prn as needed  . Probiotic Product  (PROBIOTIC-10 PO) Take by mouth.   . ramipril (ALTACE) 10 MG capsule Take 10 mg by mouth daily.   Marland Kitchen saccharomyces boulardii (FLORASTOR) 250 MG capsule Take 250 mg by mouth daily.    Alveda Reasons 20 MG TABS tablet TAKE 1 TABLET BY MOUTH  DAILY    No facility-administered encounter medications on file as of 04/29/2017.     Functional Status:  In your present state of health, do you have any difficulty performing the following activities: 03/28/2017 10/06/2016  Hearing? Y Y  Comment hearing aides for both ears -  Vision? N N  Difficulty concentrating or making decisions? N N  Walking or climbing stairs? N N  Dressing or bathing? N N  Doing errands, shopping? N N  Preparing Food and eating ? N N  Using the Toilet? N N  In the past six months, have you accidently leaked urine? N N  Do you have problems with loss of bowel control? Y N  Managing your Medications? N N  Managing your Finances? N N  Housekeeping or managing your Housekeeping? N N  Some recent data might be hidden    Fall/Depression Screening: Fall Risk  03/02/2017 12/02/2016 11/18/2016  Falls in the past year? No No No   PHQ 2/9 Scores 03/02/2017 10/06/2016 10/01/2016 10/03/2014  PHQ - 2 Score 0 0 0 0   THN CM Care Plan Problem One     Most Recent Value  Care Plan Problem One  Chronic  disease management needs related to Type 2 Diabetes  Role Documenting the Problem One  [place] for Problem One  Active  THN Long Term Goal   Patient's A1C will reduce from 6.3 to 6 within 90 days.  THN Long Term Goal Start Date  04/29/17  Interventions for Problem One Long Term Goal  Encouraged healthier meal choices with regards to current diet and colon mass and diverticuitis,  recognized and encouraged continued daily activity when feeling well, Current care plan discussed and reviewed with patient and wife,  encouraged medication compliance  THN CM Short Term Goal #1   Patient will be able to name 3 foods he is limiting to  decrease carbohydrate intake.  THN CM Short Term Goal #1 Start Date  04/29/17  Interventions for Short Term Goal #1  Reinforced need to decrease carbohydrates, encouraged the patient to continue with his incease in water intake, encouraged better food choices for patient after surgery and he returns to eating regular foods  THN CM Short Term Goal #2   Patient will have surgery scheduled within the next 30 days  THN CM Short Term Goal #2 Start Date  04/29/17  Interventions for Short Term Goal #2  discussed with patient and wife reasoning for scheduling surgery now instead of at a time of emergency per surgeon recommendations     Appointments:  Patient seen primary care provider on 04/13/2017 and plans to see him on 3 month follow up.  Patient saw Endocrinologist, Dr. Cruzita Lederer on 04/08/2017 and next scheduled appointment is 07/28/2017.  Plan:  Discussed with patient and wife the possible need for [facility] [facility] RN after surgery.  RN Health Coach will reach out to [facility] while patient in hospital to touch basis with patient or wife to assess needs.  RN Health Coach will send primary care provider quarterly update.  [place] Coach [phone] [email or web address]

## 2017-05-12 NOTE — Telephone Encounter (Signed)
Pt's wife called because her insulin has arrived but her husbands has not. Advised wife to call with her tracking number to see about her husbands insulin.

## 2017-05-16 DIAGNOSIS — D509 Iron deficiency anemia, unspecified: Secondary | ICD-10-CM | POA: Diagnosis not present

## 2017-05-26 ENCOUNTER — Other Ambulatory Visit: Payer: Self-pay | Admitting: *Deleted

## 2017-05-26 NOTE — Patient Outreach (Signed)
[facility] [facility]) Care Management  05/26/2017  [name] 01-22-1946 [id number]   [place] Monthly Outreach  Referral Date: 09/29/16 Referral Source: self referral Reason for Referral: Not able to afford prescriptions due to cost Insurance: [employer]  Outreach Attempt:  Outreach attempt #1 to patient for monthly follow up. No answer. RN Health Coach left HIPAA compliant voicemail message along with contact information.  Plan:  RN Health Coach will attempt another monthly outreach to patient in the month of January.  [place] [phone] [email or web address]

## 2017-05-30 ENCOUNTER — Other Ambulatory Visit: Payer: Self-pay | Admitting: Urology

## 2017-05-30 NOTE — Progress Notes (Signed)
LOV/cardiac clearance Dr Angelena Form 04-22-17 epic  EKG 04-22-17 epic  HgbA1C 04-08-17 epic   ECHO 11-17-16 epic

## 2017-05-30 NOTE — Patient Instructions (Addendum)
[name]  05/30/2017   Your procedure is scheduled on: 06-03-17   Report to [facility] Main  Entrance    Follow signs to Short Stay on first floor at 530 AM    Call this number if you have problems the morning of surgery [phone]     Remember: Do not eat food After Midnight on Wednesday 06-01-17. Drink plenty of clear liquids all day Thursday 06-02-17. Please follow all bowel prep instructions provided by your surgeon . * DRINK 2 PRESURGERY ENSURE DRINKS THE NIGHT BEFORE SURGERY AT  10:00 PM AND 1 PRESURGERY DRINK THE DAY OF THE PROCEDURE 3 HOURS PRIOR TO SCHEDULED SURGERY.CONTINUE DRINKING CLEAR LIQUIDS UNTIL THREE HOURS PRIOR TO SCHEDULED SURGERY. PLEASE FINISH PRESURGERY LAST ENSURE DRINK PER SURGEON ORDER 3 HOURS PRIOR TO SCHEDULED SURGERY TIME WHICH NEEDS TO BE COMPLETED AT _____4:15AM____.   CLEAR LIQUID DIET   Foods Allowed                                                                     Foods Excluded  Coffee and tea, regular and decaf                             liquids that you cannot  Plain Jell-O in any flavor                                             see through such as: Fruit ices (not with fruit pulp)                                     milk, soups, orange juice  Iced Popsicles                                    All solid food Carbonated beverages, regular and diet                                    Cranberry, grape and apple juices Sports drinks like Gatorade Lightly seasoned clear broth or consume(fat free) Sugar, honey syrup  Sample Menu Breakfast                                Lunch                                     Supper Cranberry juice                    Beef broth  Chicken broth Jell-O                                     Grape juice                           Apple juice Coffee or tea                        Jell-O                                      Popsicle                                                 Coffee or tea                        Coffee or tea      Take these medicines the morning of surgery with A SIP OF WATER: metoprolol, omeprazole                                 You may not have any metal on your body including hair pins and              piercings  Do not wear jewelry, make-up, lotions, powders or perfumes, deodorant              Men may shave face and neck.   Do not bring valuables to the hospital. [place].  Contacts, dentures or bridgework may not be worn into surgery.  Leave suitcase in the car. After surgery it may be brought to your room.                Please read over the following fact sheets you were given:   _____________________________________________________________________    How to Manage Your Diabetes Before and After Surgery  Why is it important to control my blood sugar before and after surgery? . Improving blood sugar levels before and after surgery helps healing and can limit problems. . A way of improving blood sugar control is eating a healthy diet by: o  Eating less sugar and carbohydrates o  Increasing activity/exercise o  Talking with your doctor about reaching your blood sugar goals . High blood sugars (greater than 180 mg/dL) can raise your risk of infections and slow your recovery, so you will need to focus on controlling your diabetes during the weeks before surgery. . Make sure that the doctor who takes care of your diabetes knows about your planned surgery including the date and location.  How do I manage my blood sugar before surgery? . Check your blood sugar at least 4 times a day, starting 2 days before surgery, to make sure that the level is not too high or low. o Check your blood sugar the morning of your surgery when you wake up and every 2 hours until you get to the Short Stay unit. [place]  If your blood sugar is less than 70 mg/dL, you will need to treat for low blood  sugar: o Do not take insulin. o Treat a low blood sugar (less than 70 mg/dL) with  cup of clear juice (cranberry or apple), 4 glucose tablets, OR glucose gel. o Recheck blood sugar in 15 minutes after treatment (to make sure it is greater than 70 mg/dL). If your blood sugar is not greater than 70 mg/dL on recheck, call [phone] for further instructions. . Report your blood sugar to the short stay nurse when you get to Short Stay.  . If you are admitted to the hospital after surgery: o Your blood sugar will be checked by the staff and you will probably be given insulin after surgery (instead of oral diabetes medicines) to make sure you have good blood sugar levels. o The goal for blood sugar control after surgery is 80-180 mg/dL.   WHAT DO I DO ABOUT MY DIABETES MEDICATION?   . THE DAY BEFORE SURGERY o Take GLIPIZIDE-METFORMIN as normal o Take TRULICITY as normal o Take 14 units of HUMULIN 70/30 INSULIN at your normally scheduled times      . THE MORNING OF SURGERY o DO NOT TAKE ANY MEDICINE FOR DIABETES     Patient Signature:  Date:   Nurse Signature:  Date:   Reviewed and Endorsed by [facility] Patient Education Committee, August 2015   [facility] - Preparing for Surgery Before surgery, you can play an important role.  Because skin is not sterile, your skin needs to be as free of germs as possible.  You can reduce the number of germs on your skin by washing with CHG (chlorahexidine gluconate) soap before surgery.  CHG is an antiseptic cleaner which kills germs and bonds with the skin to continue killing germs even after washing. Please DO NOT use if you have an allergy to CHG or antibacterial soaps.  If your skin becomes reddened/irritated stop using the CHG and inform your nurse when you arrive at Short Stay. Do not shave (including legs and underarms) for at least 48 hours prior to the first CHG shower.  You may shave your face/neck. Please follow these instructions  carefully:  1.  Shower with CHG Soap the night before surgery and the  morning of Surgery.  2.  If you choose to wash your hair, wash your hair first as usual with your  normal  shampoo.  3.  After you shampoo, rinse your hair and body thoroughly to remove the  shampoo.                           4.  Use CHG as you would any other liquid soap.  You can apply chg directly  to the skin and wash                       Gently with a scrungie or clean washcloth.  5.  Apply the CHG Soap to your body ONLY FROM THE NECK DOWN.   Do not use on face/ open                           Wound or open sores. Avoid contact with eyes, ears mouth and genitals (private parts).                       Wash face,  Genitals (private parts) with your normal soap.             6.  Wash thoroughly, paying special attention to the area where your surgery  will be performed.  7.  Thoroughly rinse your body with warm water from the neck down.  8.  DO NOT shower/wash with your normal soap after using and rinsing off  the CHG Soap.                9.  Pat yourself dry with a clean towel.            10.  Wear clean pajamas.            11.  Place clean sheets on your bed the night of your first shower and do not  sleep with pets. Day of Surgery : Do not apply any lotions/deodorants the morning of surgery.  Please wear clean clothes to the hospital/surgery center.  FAILURE TO FOLLOW THESE INSTRUCTIONS MAY RESULT IN THE CANCELLATION OF YOUR SURGERY PATIENT SIGNATURE_________________________________  NURSE SIGNATURE__________________________________  ________________________________________________________________________   Adam Phenix  An incentive spirometer is a tool that can help keep your lungs clear and active. This tool measures how well you are filling your lungs with each breath. Taking long deep breaths may help reverse or decrease the chance of developing breathing (pulmonary) problems (especially infection)  following:  A long period of time when you are unable to move or be active. BEFORE THE PROCEDURE   If the spirometer includes an indicator to show your best effort, your nurse or respiratory therapist will set it to a desired goal.  If possible, sit up straight or lean slightly forward. Try not to slouch.  Hold the incentive spirometer in an upright position. INSTRUCTIONS FOR USE  1. Sit on the edge of your bed if possible, or sit up as far as you can in bed or on a chair. 2. Hold the incentive spirometer in an upright position. 3. Breathe out normally. 4. Place the mouthpiece in your mouth and seal your lips tightly around it. 5. Breathe in slowly and as deeply as possible, raising the piston or the ball toward the top of the column. 6. Hold your breath for 3-5 seconds or for as long as possible. Allow the piston or ball to fall to the bottom of the column. 7. Remove the mouthpiece from your mouth and breathe out normally. 8. Rest for a few seconds and repeat Steps 1 through 7 at least 10 times every 1-2 hours when you are awake. Take your time and take a few normal breaths between deep breaths. 9. The spirometer may include an indicator to show your best effort. Use the indicator as a goal to work toward during each repetition. 10. After each set of 10 deep breaths, practice coughing to be sure your lungs are clear. If you have an incision (the cut made at the time of surgery), support your incision when coughing by placing a pillow or rolled up towels firmly against it. Once you are able to get out of bed, walk around indoors and cough well. You may stop using the incentive spirometer when instructed by your caregiver.  RISKS AND COMPLICATIONS  Take your time so you do not get dizzy or light-headed.  If you are in pain, you may need to take or ask for pain medication before doing incentive spirometry. It is harder to take a deep breath if you are having pain. AFTER USE  Rest and  breathe slowly and easily.  It can be helpful to keep track of a log of your progress. Your caregiver can provide you with a simple table to help with this. If you are using the spirometer at home, follow these instructions: [place] IF:   You are having difficultly using the spirometer.  You have trouble using the spirometer as often as instructed.  Your pain medication is not giving enough relief while using the spirometer.  You develop fever of 100.5 F (38.1 C) or higher. SEEK IMMEDIATE MEDICAL CARE IF:   You cough up bloody sputum that had not been present before.  You develop fever of 102 F (38.9 C) or greater.  You develop worsening pain at or near the incision site. MAKE SURE YOU:   Understand these instructions.  Will watch your condition.  Will get help right away if you are not doing well or get worse. Document Released: 09/06/2006 Document Revised: 07/19/2011 Document Reviewed: 11/07/2006 ExitCare Patient Information 2014 ExitCare, [place].   ________________________________________________________________________  WHAT IS A BLOOD TRANSFUSION? Blood Transfusion Information  A transfusion is the replacement of blood or some of its parts. Blood is made up of multiple cells which provide different functions.  Red blood cells carry oxygen and are used for blood loss replacement.  White blood cells fight against infection.  Platelets control bleeding.  Plasma helps clot blood.  Other blood products are available for specialized needs, such as hemophilia or other clotting disorders. BEFORE THE TRANSFUSION  Who gives blood for transfusions?   Healthy volunteers who are fully evaluated to make sure their blood is safe. This is blood bank blood. Transfusion therapy is the safest it has ever been in the practice of medicine. Before blood is taken from a donor, a complete history is taken to make sure that person has no history of diseases nor engages in  risky social behavior (examples are intravenous drug use or sexual activity with multiple partners). The donor's travel history is screened to minimize risk of transmitting infections, such as malaria. The donated blood is tested for signs of infectious diseases, such as HIV and hepatitis. The blood is then tested to be sure it is compatible with you in order to minimize the chance of a transfusion reaction. If you or a relative donates blood, this is often done in anticipation of surgery and is not appropriate for emergency situations. It takes many days to process the donated blood. RISKS AND COMPLICATIONS Although transfusion therapy is very safe and saves many lives, the main dangers of transfusion include:   Getting an infectious disease.  Developing a transfusion reaction. This is an allergic reaction to something in the blood you were given. Every precaution is taken to prevent this. The decision to have a blood transfusion has been considered carefully by your caregiver before blood is given. Blood is not given unless the benefits outweigh the risks. AFTER THE TRANSFUSION  Right after receiving a blood transfusion, you will usually feel much better and more energetic. This is especially true if your red blood cells have gotten low (anemic). The transfusion raises the level of the red blood cells which carry oxygen, and this usually causes an energy increase.  The nurse administering the transfusion will monitor you carefully for complications. HOME CARE INSTRUCTIONS  No special instructions are needed after a transfusion. You may find your energy is better. Speak with your caregiver about any limitations on activity for underlying diseases you may have.  SEEK MEDICAL CARE IF:   Your condition is not improving after your transfusion.  You develop redness or irritation at the intravenous (IV) site. SEEK IMMEDIATE MEDICAL CARE IF:  Any of the following symptoms occur over the next 12  hours:  Shaking chills.  You have a temperature by mouth above 102 F (38.9 C), not controlled by medicine.  Chest, back, or muscle pain.  People around you feel you are not acting correctly or are confused.  Shortness of breath or difficulty breathing.  Dizziness and fainting.  You get a rash or develop hives.  You have a decrease in urine output.  Your urine turns a dark color or changes to pink, red, or brown. Any of the following symptoms occur over the next 10 days:  You have a temperature by mouth above 102 F (38.9 C), not controlled by medicine.  Shortness of breath.  Weakness after normal activity.  The white part of the eye turns yellow (jaundice).  You have a decrease in the amount of urine or are urinating less often.  Your urine turns a dark color or changes to pink, red, or brown. Document Released: 04/23/2000 Document Revised: 07/19/2011 Document Reviewed: 12/11/2007 [facility] Patient Information 2014 [place], [place].  _______________________________________________________________________

## 2017-05-31 ENCOUNTER — Encounter (HOSPITAL_COMMUNITY)
Admission: RE | Admit: 2017-05-31 | Discharge: 2017-05-31 | Disposition: A | Discharge status: Home / Self Care | Payer: Medicare Other | Source: Ambulatory Visit | Attending: Surgery | Admitting: Surgery

## 2017-05-31 ENCOUNTER — Other Ambulatory Visit: Payer: Self-pay

## 2017-05-31 ENCOUNTER — Encounter (HOSPITAL_COMMUNITY): Payer: Self-pay

## 2017-05-31 DIAGNOSIS — R310 Gross hematuria: Secondary | ICD-10-CM | POA: Diagnosis not present

## 2017-05-31 DIAGNOSIS — K219 Gastro-esophageal reflux disease without esophagitis: Secondary | ICD-10-CM | POA: Diagnosis not present

## 2017-05-31 DIAGNOSIS — E1122 Type 2 diabetes mellitus with diabetic chronic kidney disease: Secondary | ICD-10-CM | POA: Diagnosis not present

## 2017-05-31 DIAGNOSIS — K439 Ventral hernia without obstruction or gangrene: Secondary | ICD-10-CM | POA: Diagnosis not present

## 2017-05-31 DIAGNOSIS — Z87891 Personal history of nicotine dependence: Secondary | ICD-10-CM | POA: Diagnosis not present

## 2017-05-31 DIAGNOSIS — I714 Abdominal aortic aneurysm, without rupture: Secondary | ICD-10-CM | POA: Diagnosis not present

## 2017-05-31 DIAGNOSIS — I129 Hypertensive chronic kidney disease with stage 1 through stage 4 chronic kidney disease, or unspecified chronic kidney disease: Secondary | ICD-10-CM | POA: Diagnosis not present

## 2017-05-31 DIAGNOSIS — E1121 Type 2 diabetes mellitus with diabetic nephropathy: Secondary | ICD-10-CM | POA: Diagnosis not present

## 2017-05-31 DIAGNOSIS — E1140 Type 2 diabetes mellitus with diabetic neuropathy, unspecified: Secondary | ICD-10-CM | POA: Diagnosis not present

## 2017-05-31 DIAGNOSIS — N183 Chronic kidney disease, stage 3 (moderate): Secondary | ICD-10-CM | POA: Diagnosis not present

## 2017-05-31 DIAGNOSIS — I252 Old myocardial infarction: Secondary | ICD-10-CM | POA: Diagnosis not present

## 2017-05-31 DIAGNOSIS — E1159 Type 2 diabetes mellitus with other circulatory complications: Secondary | ICD-10-CM | POA: Diagnosis not present

## 2017-05-31 DIAGNOSIS — E785 Hyperlipidemia, unspecified: Secondary | ICD-10-CM | POA: Diagnosis not present

## 2017-05-31 DIAGNOSIS — E876 Hypokalemia: Secondary | ICD-10-CM | POA: Diagnosis not present

## 2017-05-31 DIAGNOSIS — K565 Intestinal adhesions [bands], unspecified as to partial versus complete obstruction: Secondary | ICD-10-CM | POA: Diagnosis not present

## 2017-05-31 DIAGNOSIS — Z7901 Long term (current) use of anticoagulants: Secondary | ICD-10-CM | POA: Diagnosis not present

## 2017-05-31 DIAGNOSIS — N179 Acute kidney failure, unspecified: Secondary | ICD-10-CM | POA: Diagnosis not present

## 2017-05-31 DIAGNOSIS — Z955 Presence of coronary angioplasty implant and graft: Secondary | ICD-10-CM | POA: Diagnosis not present

## 2017-05-31 DIAGNOSIS — I2510 Atherosclerotic heart disease of native coronary artery without angina pectoris: Secondary | ICD-10-CM | POA: Diagnosis not present

## 2017-05-31 DIAGNOSIS — I4891 Unspecified atrial fibrillation: Secondary | ICD-10-CM | POA: Diagnosis not present

## 2017-05-31 LAB — HEMOGLOBIN A1C
Hgb A1c MFr Bld: 5.5 % (ref 4.8–5.6)
Mean Plasma Glucose: 111.15 mg/dL

## 2017-05-31 LAB — CBC WITH DIFFERENTIAL/PLATELET
BASOS ABS: 0 10*3/uL (ref 0.0–0.1)
BASOS PCT: 1 %
EOS ABS: 0.2 10*3/uL (ref 0.0–0.7)
Eosinophils Relative: 3 %
HEMATOCRIT: 35.8 % — AB (ref 39.0–52.0)
Hemoglobin: 11.5 g/dL — ABNORMAL LOW (ref 13.0–17.0)
Lymphocytes Relative: 32 %
Lymphs Abs: 2.1 10*3/uL (ref 0.7–4.0)
MCH: 30.6 pg (ref 26.0–34.0)
MCHC: 32.1 g/dL (ref 30.0–36.0)
MCV: 95.2 fL (ref 78.0–100.0)
MONO ABS: 0.7 10*3/uL (ref 0.1–1.0)
MONOS PCT: 11 %
NEUTROS ABS: 3.5 10*3/uL (ref 1.7–7.7)
Neutrophils Relative %: 53 %
PLATELETS: 196 10*3/uL (ref 150–400)
RBC: 3.76 MIL/uL — ABNORMAL LOW (ref 4.22–5.81)
RDW: 14.2 % (ref 11.5–15.5)
WBC: 6.5 10*3/uL (ref 4.0–10.5)

## 2017-05-31 LAB — COMPREHENSIVE METABOLIC PANEL
ALBUMIN: 3.6 g/dL (ref 3.5–5.0)
ALT: 13 U/L — ABNORMAL LOW (ref 17–63)
ANION GAP: 8 (ref 5–15)
AST: 19 U/L (ref 15–41)
Alkaline Phosphatase: 55 U/L (ref 38–126)
BILIRUBIN TOTAL: 0.5 mg/dL (ref 0.3–1.2)
BUN: 14 mg/dL (ref 6–20)
CHLORIDE: 106 mmol/L (ref 101–111)
CO2: 26 mmol/L (ref 22–32)
Calcium: 8.8 mg/dL — ABNORMAL LOW (ref 8.9–10.3)
Creatinine, Ser: 1.26 mg/dL — ABNORMAL HIGH (ref 0.61–1.24)
GFR calc Af Amer: >60 mL/min (ref >60)
GFR calc non Af Amer: 56 mL/min — ABNORMAL LOW (ref >60)
GLUCOSE: 131 mg/dL — AB (ref 65–99)
POTASSIUM: 3.3 mmol/L — AB (ref 3.5–5.1)
SODIUM: 140 mmol/L (ref 135–145)
TOTAL PROTEIN: 6.9 g/dL (ref 6.5–8.1)

## 2017-05-31 LAB — GLUCOSE, CAPILLARY: GLUCOSE-CAPILLARY: 117 mg/dL — AB (ref 65–99)

## 2017-05-31 LAB — PROTIME-INR
INR: 1.05
PROTHROMBIN TIME: 13.6 s (ref 11.4–15.2)

## 2017-05-31 LAB — ABO/RH: ABO/RH(D): O POS

## 2017-05-31 NOTE — Consult Note (Signed)
[place] Nurse requested for preoperative stoma site marking  Discussed surgical procedure and stoma creation with patient and family.  Explained role of the [place] nurse team.  Provided the patient with educational booklet and provided samples of pouching options.  Answered patient and family questions.   Examined patient sitting, and standing in order to place the marking in the patient's visual field, away from any creases or abdominal contour issues and within the rectus muscle. Marked above patient belt line, due to the fact patient wears his pants very low.   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   Patient's abdomen cleansed with CHG wipes at site markings, allowed to air dry prior to marking.Covered mark with thin film transparent dressing to preserve mark until date of surgery.   [place] Nurse team will follow up with patient after surgery for continue ostomy care and teaching.  [place] MSN, [place], [place], [place]

## 2017-06-01 ENCOUNTER — Encounter: Payer: Self-pay | Admitting: *Deleted

## 2017-06-01 ENCOUNTER — Other Ambulatory Visit: Payer: Self-pay | Admitting: *Deleted

## 2017-06-01 NOTE — Patient Outreach (Signed)
[facility]) Care Management  06/01/2017  [name] 10/11/1945 [id number]   RN Health Coach Monthly Outreach  Referral Date: 09/29/16 Referral Source: self referral Reason for Referral: Not able to afford prescriptions due to cost Insurance: [employer]   Outreach Attempt:   Successful telephone outreach to patient for monthly follow up.  Spoke with patient's wife per patient's request.  Wife confirmed HIPAA identifiers.  Wife states patient is feeling some better.  Reports less pain and able to tolerate solid foods.  States he is preparing for colon surgery on 06/03/2017, this Friday.  Wife states patient has stopped his xarelto for his planned surgery.  CBG this morning 164, with fasting ranges of 120-160's.  Reports less episodes of hypoglycemia over the last few weeks.  Wife expressing frustration with retrieval process of Novolin 70/30 at [facility] office from [employer].  Encouraged wife to speak with office manager at Dr. Arman Filter office.  Discussed with wife and patient that patient may be referred to [facility] or Telephonic team at discharge, will have [facility] Liaison follow patient when hospitalized.  They stated their understanding.  Appointments:   Patient has scheduled appointment with primary care provider, Cyndi Bender PA in march 2019 and scheduled appointment with Dr. Cruzita Lederer on 07/28/2017.  Plan:  RN Health Coach will notify [facility] Liaison when patient is hospitalized for surgery this Friday to assess discharge needs.   [place] [phone] [name]

## 2017-06-02 ENCOUNTER — Encounter (HOSPITAL_COMMUNITY): Payer: Self-pay | Admitting: Anesthesiology

## 2017-06-02 NOTE — Anesthesia Preprocedure Evaluation (Addendum)
Anesthesia Evaluation  Patient identified by MRN, date of birth, ID band Patient awake    Reviewed: Allergy & Precautions, NPO status , Patient's Chart, lab work & pertinent test results, reviewed documented beta blocker date and time   Airway Mallampati: I       Dental  (+) Poor Dentition,    Pulmonary former smoker,    Pulmonary exam normal breath sounds clear to auscultation       Cardiovascular hypertension, Pt. on home beta blockers  Rhythm:Irregular Rate:Normal     Neuro/Psych negative psych ROS   GI/Hepatic Neg liver ROS, GERD  Medicated,  Endo/Other  diabetes, Type 2, Oral Hypoglycemic Agents, Insulin Dependent  Renal/GU      Musculoskeletal   Abdominal Normal abdominal exam  (+)   Peds  Hematology  (+) Blood dyscrasia, anemia ,   Anesthesia Other Findings [name]  ECHO COMPLETE WITH IMAGING ENHANCING AGENT  Order# [id number]  Reading physician: Skeet Latch, MD Ordering physician: Burnell Blanks, MD Study date: 11/17/16 Result Notes for ECHOCARDIOGRAM COMPLETE   Notes recorded by Thompson Grayer, RN on 11/19/2016 at 8:06 AM EDT Pt notified ------  Notes recorded by Burnell Blanks, MD on 11/18/2016 at 7:44 AM EDT Normal LV function. Minimal valve disease. Can we let him know? Thanks, chris   Study Result   Result status: Final result                          [name]                        [address], [place] [address]                            [phone]  ------------------------------------------------------------------- Transthoracic Echocardiography  Patient:    [name], [name] MR #:       [id number] Study Date: 11/17/2016 Gender:     M Age:        72 Height:     185.4 cm Weight:     110.4 kg BSA:        2.41 m^2 Pt. Status: Room:   SONOGRAPHER  Florestine Avers,  [place], [place], Outpatient  ATTENDING    Skeet Latch, MD  cc:  ------------------------------------------------------------------- LV EF: 50% -   55%  ------------------------------------------------------------------- Indications:      I48.1 Persistent atrial fibrillation I25.10 Coronary artery disease involving native coronary artery.  ------------------------------------------------------------------- History:   PMH:   Atrial fibrillation.  Risk factors:  Diabetes mellitus.  ------------------------------------------------------------------- Study Conclusions  - Left ventricle: The cavity size was normal. There was moderate   concentric hypertrophy. Systolic function was normal. The   estimated ejection fraction was in the range of 50% to 55%. Mild   hypokinesis of the inferoseptal and apical inferior myocardium. - Aortic valve: Transvalvular velocity was within the normal range.   There was no stenosis. There was no regurgitation. - Mitral valve: Transvalvular velocity was within the normal range.   There was no evidence for stenosis. There was no regurgitation. - Left atrium: The atrium was severely dilated. - Right ventricle: The cavity size  was normal. Wall thickness was   normal. Systolic function was normal. - Right atrium: The atrium was severely dilated. - Atrial septum: No defect or patent foramen ovale was identified   by color flow Doppler. - Tricuspid valve: There was mild regurgitation. - Pulmonary arteries: Systolic pressure was within the normal   range. PA peak pressure: 35 mm Hg (S).  ------------------------------------------------------------------- Study data:  Comparison was made to the study of 03/02/2012.  Study status:  Routine.  Procedure:  The patient reported no pain pre or post test. Transthoracic echocardiography. Image quality was adequate. The study was technically difficult,  as a result of body habitus. Intravenous contrast (Definity) was administered.  Study completion:  There were no complications.          Transthoracic echocardiography.  M-mode, complete 2D, spectral Doppler, and color Doppler.  Birthdate:  Patient birthdate: 07/03/1945.  Age:  Patient is 72 yr old.  Sex:  Gender: male.    BMI: 32.1 kg/m^2.  Blood pressure:     142/97  Patient status:  Outpatient.  Study date: Study date: 11/17/2016. Study time: 02:04 PM.  Location:  [facility]  -------------------------------------------------------------------  ------------------------------------------------------------------- Left ventricle:  The cavity size was normal. There was moderate concentric hypertrophy. Systolic function was normal. The estimated ejection fraction was in the range of 50% to 55%.  Regional wall motion abnormalities:  Mild hypokinesis of the inferoseptal and apical inferior myocardium. The study was not technically sufficient to allow evaluation of LV diastolic dysfunction due to atrial fibrillation.  ------------------------------------------------------------------- Aortic valve:   Trileaflet; normal thickness leaflets. Mobility was not restricted.  Doppler:  Transvalvular velocity was within the normal range. There was no stenosis. There was no regurgitation.   ------------------------------------------------------------------- Aorta:  Aortic root: The aortic root was normal in size.  ------------------------------------------------------------------- Mitral valve:   Structurally normal valve.   Mobility was not restricted.  Doppler:  Transvalvular velocity was within the normal range. There was no evidence for stenosis. There was no regurgitation.  ------------------------------------------------------------------- Left atrium:  The atrium was severely dilated.  ------------------------------------------------------------------- Atrial septum:  No  defect or patent foramen ovale was identified by color flow Doppler.  ------------------------------------------------------------------- Right ventricle:  The cavity size was normal. Wall thickness was normal. Systolic function was normal.  ------------------------------------------------------------------- Pulmonic valve:    Structurally normal valve.   Cusp separation was normal.  Doppler:  Transvalvular velocity was within the normal range. There was no evidence for stenosis. There was no regurgitation.  ------------------------------------------------------------------- Tricuspid valve:   Structurally normal valve.    Doppler: Transvalvular velocity was within the normal range. There was mild regurgitation.  ------------------------------------------------------------------- Pulmonary artery:   The main pulmonary artery was normal-sized. Systolic pressure was within the normal range.  ------------------------------------------------------------------- Right atrium:  The atrium was severely dilated.  ------------------------------------------------------------------- Pericardium:  There was no pericardial effusion.  ------------------------------------------------------------------- Systemic veins: Inferior vena cava: The vessel was normal in size. The respirophasic diameter changes were blunted (< 50%), consistent with normal central venous pressure.  ------------------------------------------------------------------- Measurements   Left ventricle                         Value        Reference  LV ID, ED, PLAX chordal        (L)     42    mm     43 - 52  LV ID, ES, PLAX chordal  30    mm     23 - 38  LV fx shortening, PLAX chordal         29    %      >=29  LV PW thickness, ED                    14    mm     ----------  IVS/LV PW ratio, ED            (H)     1.5          <=1.3    Ventricular septum                     Value        Reference   IVS thickness, ED                      21    mm     ----------    LVOT                                   Value        Reference  LVOT ID, S                             23    mm     ----------  LVOT area                              4.15  cm^2   ----------    Aorta                                  Value        Reference  Aortic root ID, ED                     37    mm     ----------    Left atrium                            Value        Reference  LA ID, A-P, ES                         49    mm     ----------  LA ID/bsa, A-P                         2.03  cm/m^2 <=2.2  LA volume, S                           126   ml     ----------  LA volume/bsa, S                       52.2  ml/m^2 ----------  LA volume, ES, 1-p A4C                 120   ml     ----------  LA volume/bsa, ES, 1-p A4C  49.7  ml/m^2 ----------  LA volume, ES, 1-p A2C                 133   ml     ----------  LA volume/bsa, ES, 1-p A2C             55.1  ml/m^2 ----------    Pulmonary arteries                     Value        Reference  PA pressure, S, DP             (H)     35    mm Hg  <=30    Tricuspid valve                        Value        Reference  Tricuspid regurg peak velocity         259   cm/s   ----------  Tricuspid peak RV-RA gradient          27    mm Hg  ----------    Right atrium                           Value        Reference  RA ID, S-I, ES, A4C            (H)     69.3  mm     34 - 49  RA area, ES, A4C               (H)     29    cm^2   8.3 - 19.5  RA volume, ES, A/L                     107   ml     ----------  RA volume/bsa, ES, A/L                 44.3  ml/m^2 ----------    Systemic veins                         Value        Reference  Estimated CVP                          8     mm Hg  ----------    Right ventricle                        Value        Reference  RV pressure, S, DP             (H)     35    mm Hg  <=30  Legend: (L)  and  (H)  mark values outside specified reference  range.  ------------------------------------------------------------------- Prepared and Electronically Authenticated by  Skeet Latch, MD 2018-07-11T18:39:48 [facility] Images   Show images for ECHOCARDIOGRAM COMPLETE Patient Information   Patient Name [name], [name] Sex Male DOB 17-Mar-1946 SSN [id number] Reason For Exam  Priority: Routine  Dx: Persistent atrial fibrillation ([place]) (I48.1 (ICD-10-CM)); Coronary artery disease involving native coronary artery of native heart without angina pectoris (I25.10 (ICD-10-CM)) Surgical History   Surgical History    Procedure Laterality Date Comment  Source CARDIAC CATHETERIZATION  2006 stable with primarily nonobstructive disease Provider CORONARY ANGIOPLASTY WITH STENT PLACEMENT  2001 bare-metal stent to the LAD Provider  Other Surgical History    Procedure Laterality Date Comment Source CARDIOVERSION  03/27/2012 Procedure: CARDIOVERSION; Surgeon: Carlena Bjornstad, MD; Location: [name]; Service: Cardiovascular; Laterality: N/A; Provider HERNIA REPAIR  2006  Provider SKIN GRAFT  1981  Provider  Performing Technologist/Nurse   Performing Technologist/Nurse: Valinda Hoar, RCS          Implants     No active implants to display in this view. Order-Level Documents:   There are no order-level documents.  Encounter-Level Documents - 11/17/2016:   Scan on 11/23/2016 1:49 PM by Default, Provider, MDScan on 11/23/2016 1:49 PM by Default, Provider, MD  Electronic signature on 11/17/2016 1:37 PM  Electronic signature on 11/17/2016 1:37 PM    Signed   Electronically signed by Skeet Latch, MD on 11/17/16 at [place] EDT Printable Result Report    Result Report  External Result Report    External Result Report     Reproductive/Obstetrics                          Anesthesia Physical Anesthesia Plan  ASA: III  Anesthesia Plan: General   Post-op Pain Management:     Induction: Intravenous  PONV Risk Score and Plan: 3 and Ondansetron and Treatment may vary due to age or medical condition  Airway Management Planned: Oral ETT  Additional Equipment:   Intra-op Plan:   Post-operative Plan: Extubation in OR  Informed Consent: I have reviewed the patients History and Physical, chart, labs and discussed the procedure including the risks, benefits and alternatives for the proposed anesthesia with the patient or authorized representative who has indicated his/her understanding and acceptance.   Dental advisory given  Plan Discussed with: CRNA and Surgeon  Anesthesia Plan Comments:        Anesthesia Quick Evaluation

## 2017-06-03 ENCOUNTER — Encounter (HOSPITAL_COMMUNITY)
Admission: RE | Disposition: A | Discharge status: Home Health | Payer: Self-pay | Source: Ambulatory Visit | Attending: Surgery

## 2017-06-03 ENCOUNTER — Inpatient Hospital Stay (HOSPITAL_COMMUNITY): Payer: Medicare Other

## 2017-06-03 ENCOUNTER — Inpatient Hospital Stay (HOSPITAL_COMMUNITY): Payer: Medicare Other | Admitting: Certified Registered Nurse Anesthetist

## 2017-06-03 ENCOUNTER — Encounter (HOSPITAL_COMMUNITY): Payer: Self-pay | Admitting: *Deleted

## 2017-06-03 ENCOUNTER — Inpatient Hospital Stay (HOSPITAL_COMMUNITY)
Admission: RE | Admit: 2017-06-03 | Discharge: 2017-06-07 | DRG: 330 | Disposition: A | Discharge status: Home Health | Payer: Medicare Other | Source: Ambulatory Visit | Attending: Surgery | Admitting: Surgery

## 2017-06-03 DIAGNOSIS — K565 Intestinal adhesions [bands], unspecified as to partial versus complete obstruction: Principal | ICD-10-CM | POA: Diagnosis present

## 2017-06-03 DIAGNOSIS — E1140 Type 2 diabetes mellitus with diabetic neuropathy, unspecified: Secondary | ICD-10-CM | POA: Diagnosis present

## 2017-06-03 DIAGNOSIS — N179 Acute kidney failure, unspecified: Secondary | ICD-10-CM | POA: Diagnosis not present

## 2017-06-03 DIAGNOSIS — I252 Old myocardial infarction: Secondary | ICD-10-CM

## 2017-06-03 DIAGNOSIS — R0989 Other specified symptoms and signs involving the circulatory and respiratory systems: Secondary | ICD-10-CM

## 2017-06-03 DIAGNOSIS — Z8249 Family history of ischemic heart disease and other diseases of the circulatory system: Secondary | ICD-10-CM

## 2017-06-03 DIAGNOSIS — G629 Polyneuropathy, unspecified: Secondary | ICD-10-CM

## 2017-06-03 DIAGNOSIS — K5792 Diverticulitis of intestine, part unspecified, without perforation or abscess without bleeding: Secondary | ICD-10-CM | POA: Diagnosis not present

## 2017-06-03 DIAGNOSIS — K5732 Diverticulitis of large intestine without perforation or abscess without bleeding: Secondary | ICD-10-CM | POA: Diagnosis not present

## 2017-06-03 DIAGNOSIS — E785 Hyperlipidemia, unspecified: Secondary | ICD-10-CM

## 2017-06-03 DIAGNOSIS — E1122 Type 2 diabetes mellitus with diabetic chronic kidney disease: Secondary | ICD-10-CM | POA: Diagnosis present

## 2017-06-03 DIAGNOSIS — I482 Chronic atrial fibrillation: Secondary | ICD-10-CM

## 2017-06-03 DIAGNOSIS — Z6832 Body mass index (BMI) 32.0-32.9, adult: Secondary | ICD-10-CM

## 2017-06-03 DIAGNOSIS — Z955 Presence of coronary angioplasty implant and graft: Secondary | ICD-10-CM

## 2017-06-03 DIAGNOSIS — N189 Chronic kidney disease, unspecified: Secondary | ICD-10-CM

## 2017-06-03 DIAGNOSIS — I2510 Atherosclerotic heart disease of native coronary artery without angina pectoris: Secondary | ICD-10-CM | POA: Diagnosis present

## 2017-06-03 DIAGNOSIS — I10 Essential (primary) hypertension: Secondary | ICD-10-CM | POA: Diagnosis not present

## 2017-06-03 DIAGNOSIS — E876 Hypokalemia: Secondary | ICD-10-CM | POA: Diagnosis not present

## 2017-06-03 DIAGNOSIS — E1159 Type 2 diabetes mellitus with other circulatory complications: Secondary | ICD-10-CM | POA: Diagnosis present

## 2017-06-03 DIAGNOSIS — K219 Gastro-esophageal reflux disease without esophagitis: Secondary | ICD-10-CM | POA: Diagnosis present

## 2017-06-03 DIAGNOSIS — D125 Benign neoplasm of sigmoid colon: Secondary | ICD-10-CM | POA: Diagnosis not present

## 2017-06-03 DIAGNOSIS — K439 Ventral hernia without obstruction or gangrene: Secondary | ICD-10-CM | POA: Diagnosis present

## 2017-06-03 DIAGNOSIS — E669 Obesity, unspecified: Secondary | ICD-10-CM | POA: Diagnosis present

## 2017-06-03 DIAGNOSIS — N183 Chronic kidney disease, stage 3 (moderate): Secondary | ICD-10-CM | POA: Diagnosis present

## 2017-06-03 DIAGNOSIS — R310 Gross hematuria: Secondary | ICD-10-CM | POA: Diagnosis present

## 2017-06-03 DIAGNOSIS — E1121 Type 2 diabetes mellitus with diabetic nephropathy: Secondary | ICD-10-CM | POA: Diagnosis not present

## 2017-06-03 DIAGNOSIS — R918 Other nonspecific abnormal finding of lung field: Secondary | ICD-10-CM | POA: Diagnosis not present

## 2017-06-03 DIAGNOSIS — I129 Hypertensive chronic kidney disease with stage 1 through stage 4 chronic kidney disease, or unspecified chronic kidney disease: Secondary | ICD-10-CM | POA: Diagnosis present

## 2017-06-03 DIAGNOSIS — Z833 Family history of diabetes mellitus: Secondary | ICD-10-CM

## 2017-06-03 DIAGNOSIS — I4891 Unspecified atrial fibrillation: Secondary | ICD-10-CM | POA: Diagnosis not present

## 2017-06-03 DIAGNOSIS — R109 Unspecified abdominal pain: Secondary | ICD-10-CM | POA: Diagnosis present

## 2017-06-03 DIAGNOSIS — I714 Abdominal aortic aneurysm, without rupture: Secondary | ICD-10-CM | POA: Diagnosis present

## 2017-06-03 DIAGNOSIS — Z7901 Long term (current) use of anticoagulants: Secondary | ICD-10-CM | POA: Diagnosis not present

## 2017-06-03 DIAGNOSIS — Z87891 Personal history of nicotine dependence: Secondary | ICD-10-CM | POA: Diagnosis not present

## 2017-06-03 DIAGNOSIS — IMO0002 Reserved for concepts with insufficient information to code with codable children: Secondary | ICD-10-CM | POA: Diagnosis present

## 2017-06-03 DIAGNOSIS — E1165 Type 2 diabetes mellitus with hyperglycemia: Secondary | ICD-10-CM | POA: Diagnosis present

## 2017-06-03 DIAGNOSIS — E1151 Type 2 diabetes mellitus with diabetic peripheral angiopathy without gangrene: Secondary | ICD-10-CM | POA: Diagnosis present

## 2017-06-03 DIAGNOSIS — Z298 Encounter for other specified prophylactic measures: Secondary | ICD-10-CM | POA: Diagnosis not present

## 2017-06-03 HISTORY — PX: CYSTOSCOPY WITH STENT PLACEMENT: SHX5790

## 2017-06-03 HISTORY — PX: LAPAROSCOPIC SIGMOID COLECTOMY: SHX5928

## 2017-06-03 LAB — TYPE AND SCREEN
ABO/RH(D): O POS
ANTIBODY SCREEN: NEGATIVE

## 2017-06-03 LAB — CBC
HCT: 34.1 % — ABNORMAL LOW (ref 39.0–52.0)
Hemoglobin: 11.2 g/dL — ABNORMAL LOW (ref 13.0–17.0)
MCH: 31.3 pg (ref 26.0–34.0)
MCHC: 32.8 g/dL (ref 30.0–36.0)
MCV: 95.3 fL (ref 78.0–100.0)
PLATELETS: 177 10*3/uL (ref 150–400)
RBC: 3.58 MIL/uL — AB (ref 4.22–5.81)
RDW: 14.2 % (ref 11.5–15.5)
WBC: 14.8 10*3/uL — AB (ref 4.0–10.5)

## 2017-06-03 LAB — GLUCOSE, CAPILLARY
GLUCOSE-CAPILLARY: 130 mg/dL — AB (ref 65–99)
GLUCOSE-CAPILLARY: 182 mg/dL — AB (ref 65–99)
GLUCOSE-CAPILLARY: 269 mg/dL — AB (ref 65–99)
Glucose-Capillary: 207 mg/dL — ABNORMAL HIGH (ref 65–99)
Glucose-Capillary: 214 mg/dL — ABNORMAL HIGH (ref 65–99)

## 2017-06-03 LAB — CREATININE, SERUM
CREATININE: 1.58 mg/dL — AB (ref 0.61–1.24)
GFR, EST AFRICAN AMERICAN: 49 mL/min — AB (ref >60)
GFR, EST NON AFRICAN AMERICAN: 42 mL/min — AB (ref >60)

## 2017-06-03 LAB — MRSA PCR SCREENING: MRSA by PCR: NEGATIVE

## 2017-06-03 SURGERY — COLECTOMY, SIGMOID, LAPAROSCOPIC
Anesthesia: General

## 2017-06-03 MED ORDER — POLYETHYLENE GLYCOL 3350 17 GM/SCOOP PO POWD: 1.0000 | Freq: Once | ORAL | Status: DC

## 2017-06-03 MED ORDER — FENTANYL CITRATE (PF) 250 MCG/5ML IJ SOLN
INTRAMUSCULAR | Status: AC
  Filled 2017-06-03: qty 5

## 2017-06-03 MED ORDER — ONDANSETRON HCL 4 MG/2ML IJ SOLN
INTRAMUSCULAR | Status: AC
  Filled 2017-06-03: qty 2

## 2017-06-03 MED ORDER — 0.9 % SODIUM CHLORIDE (POUR BTL) OPTIME
TOPICAL | Status: DC | PRN
  Administered 2017-06-03: 1000 mL
  Administered 2017-06-03: 2000 mL

## 2017-06-03 MED ORDER — PROPOFOL 10 MG/ML IV BOLUS
INTRAVENOUS | Status: DC | PRN
  Administered 2017-06-03: 20 mg via INTRAVENOUS
  Administered 2017-06-03: 140 mg via INTRAVENOUS

## 2017-06-03 MED ORDER — METOPROLOL TARTRATE 25 MG PO TABS: 25.0000 mg | ORAL_TABLET | Freq: Two times a day (BID) | ORAL | Status: DC

## 2017-06-03 MED ORDER — GABAPENTIN 300 MG PO CAPS
300.0000 mg | ORAL_CAPSULE | ORAL | Status: AC
  Administered 2017-06-03: 300 mg via ORAL
  Filled 2017-06-03: qty 1

## 2017-06-03 MED ORDER — HYDRALAZINE HCL 20 MG/ML IJ SOLN: 10.0000 mg | INTRAMUSCULAR | Status: DC | PRN

## 2017-06-03 MED ORDER — HYDROMORPHONE HCL 1 MG/ML IJ SOLN
INTRAMUSCULAR | Status: AC
  Filled 2017-06-03: qty 1

## 2017-06-03 MED ORDER — ORAL CARE MOUTH RINSE
15.0000 mL | Freq: Two times a day (BID) | OROMUCOSAL | Status: DC
  Administered 2017-06-03 – 2017-06-07 (×7): 15 mL via OROMUCOSAL

## 2017-06-03 MED ORDER — NEOMYCIN SULFATE 500 MG PO TABS: 1000.0000 mg | ORAL_TABLET | ORAL | Status: DC

## 2017-06-03 MED ORDER — FENTANYL CITRATE (PF) 100 MCG/2ML IJ SOLN
INTRAMUSCULAR | Status: DC | PRN
  Administered 2017-06-03 (×4): 50 ug via INTRAVENOUS
  Administered 2017-06-03: 25 ug via INTRAVENOUS
  Administered 2017-06-03: 50 ug via INTRAVENOUS
  Administered 2017-06-03: 100 ug via INTRAVENOUS
  Administered 2017-06-03 (×2): 50 ug via INTRAVENOUS
  Administered 2017-06-03: 25 ug via INTRAVENOUS
  Administered 2017-06-03: 50 ug via INTRAVENOUS

## 2017-06-03 MED ORDER — PHENYLEPHRINE HCL 10 MG/ML IJ SOLN
INTRAMUSCULAR | Status: DC | PRN
  Administered 2017-06-03 (×3): 80 ug via INTRAVENOUS
  Administered 2017-06-03: 120 ug via INTRAVENOUS
  Administered 2017-06-03 (×2): 80 ug via INTRAVENOUS

## 2017-06-03 MED ORDER — MIDAZOLAM HCL 5 MG/5ML IJ SOLN
INTRAMUSCULAR | Status: DC | PRN
  Administered 2017-06-03: 1 mg via INTRAVENOUS

## 2017-06-03 MED ORDER — ONDANSETRON HCL 4 MG/2ML IJ SOLN: 4.0000 mg | Freq: Four times a day (QID) | INTRAMUSCULAR | Status: DC | PRN

## 2017-06-03 MED ORDER — ESMOLOL HCL 100 MG/10ML IV SOLN
INTRAVENOUS | Status: AC
  Filled 2017-06-03: qty 10

## 2017-06-03 MED ORDER — PROMETHAZINE HCL 25 MG/ML IJ SOLN: 6.2500 mg | INTRAMUSCULAR | Status: DC | PRN

## 2017-06-03 MED ORDER — ROCURONIUM BROMIDE 50 MG/5ML IV SOSY
PREFILLED_SYRINGE | INTRAVENOUS | Status: AC
  Filled 2017-06-03: qty 10

## 2017-06-03 MED ORDER — HEPARIN SODIUM (PORCINE) 5000 UNIT/ML IJ SOLN
5000.0000 [IU] | Freq: Three times a day (TID) | INTRAMUSCULAR | Status: DC
  Administered 2017-06-03 – 2017-06-06 (×8): 5000 [IU] via SUBCUTANEOUS
  Filled 2017-06-03 (×8): qty 1

## 2017-06-03 MED ORDER — METOPROLOL TARTRATE 5 MG/5ML IV SOLN
INTRAVENOUS | Status: DC | PRN
  Administered 2017-06-03 (×2): 2 mg via INTRAVENOUS

## 2017-06-03 MED ORDER — CHLORHEXIDINE GLUCONATE CLOTH 2 % EX PADS: 6.0000 | MEDICATED_PAD | Freq: Once | CUTANEOUS | Status: DC

## 2017-06-03 MED ORDER — ACETAMINOPHEN 10 MG/ML IV SOLN: 1000.0000 mg | Freq: Once | INTRAVENOUS | Status: DC | PRN

## 2017-06-03 MED ORDER — DEXAMETHASONE SODIUM PHOSPHATE 10 MG/ML IJ SOLN
INTRAMUSCULAR | Status: DC | PRN
  Administered 2017-06-03: 10 mg via INTRAVENOUS

## 2017-06-03 MED ORDER — HEPARIN SODIUM (PORCINE) 5000 UNIT/ML IJ SOLN
5000.0000 [IU] | Freq: Once | INTRAMUSCULAR | Status: AC
  Administered 2017-06-03: 5000 [IU] via SUBCUTANEOUS
  Filled 2017-06-03: qty 1

## 2017-06-03 MED ORDER — ROCURONIUM BROMIDE 50 MG/5ML IV SOSY
PREFILLED_SYRINGE | INTRAVENOUS | Status: DC | PRN
  Administered 2017-06-03: 50 mg via INTRAVENOUS
  Administered 2017-06-03 (×2): 20 mg via INTRAVENOUS
  Administered 2017-06-03: 10 mg via INTRAVENOUS
  Administered 2017-06-03: 30 mg via INTRAVENOUS
  Administered 2017-06-03: 20 mg via INTRAVENOUS

## 2017-06-03 MED ORDER — ONDANSETRON HCL 4 MG/2ML IJ SOLN
INTRAMUSCULAR | Status: DC | PRN
  Administered 2017-06-03: 4 mg via INTRAVENOUS

## 2017-06-03 MED ORDER — ALVIMOPAN 12 MG PO CAPS
12.0000 mg | ORAL_CAPSULE | ORAL | Status: AC
  Administered 2017-06-03: 12 mg via ORAL
  Filled 2017-06-03: qty 1

## 2017-06-03 MED ORDER — HYDRALAZINE HCL 20 MG/ML IJ SOLN: 10.0000 mg | Freq: Three times a day (TID) | INTRAMUSCULAR | Status: DC | PRN

## 2017-06-03 MED ORDER — FENTANYL CITRATE (PF) 100 MCG/2ML IJ SOLN
INTRAMUSCULAR | Status: AC
  Filled 2017-06-03: qty 2

## 2017-06-03 MED ORDER — BUPIVACAINE LIPOSOME 1.3 % IJ SUSP
20.0000 mL | Freq: Once | INTRAMUSCULAR | Status: AC
  Administered 2017-06-03: 10 mL
  Filled 2017-06-03: qty 20

## 2017-06-03 MED ORDER — ALBUMIN HUMAN 5 % IV SOLN
12.5000 g | Freq: Once | INTRAVENOUS | Status: AC
  Administered 2017-06-03: 12.5 g via INTRAVENOUS

## 2017-06-03 MED ORDER — MIDAZOLAM HCL 2 MG/2ML IJ SOLN
INTRAMUSCULAR | Status: AC
  Filled 2017-06-03: qty 2

## 2017-06-03 MED ORDER — DEXAMETHASONE SODIUM PHOSPHATE 10 MG/ML IJ SOLN
INTRAMUSCULAR | Status: AC
  Filled 2017-06-03: qty 1

## 2017-06-03 MED ORDER — INSULIN DETEMIR 100 UNIT/ML ~~LOC~~ SOLN
5.0000 [IU] | Freq: Every day | SUBCUTANEOUS | Status: DC
  Administered 2017-06-03 – 2017-06-06 (×4): 5 [IU] via SUBCUTANEOUS
  Filled 2017-06-03 (×5): qty 0.05

## 2017-06-03 MED ORDER — LIDOCAINE 2% (20 MG/ML) 5 ML SYRINGE
INTRAMUSCULAR | Status: DC | PRN
  Administered 2017-06-03: 100 mg via INTRAVENOUS

## 2017-06-03 MED ORDER — LIDOCAINE 2% (20 MG/ML) 5 ML SYRINGE
INTRAMUSCULAR | Status: AC
  Filled 2017-06-03: qty 10

## 2017-06-03 MED ORDER — METOPROLOL TARTRATE 5 MG/5ML IV SOLN
5.0000 mg | Freq: Three times a day (TID) | INTRAVENOUS | Status: DC
  Administered 2017-06-03 – 2017-06-05 (×5): 5 mg via INTRAVENOUS
  Filled 2017-06-03 (×5): qty 5

## 2017-06-03 MED ORDER — SUGAMMADEX SODIUM 500 MG/5ML IV SOLN
INTRAVENOUS | Status: AC
  Filled 2017-06-03: qty 5

## 2017-06-03 MED ORDER — SODIUM CHLORIDE 0.9 % IR SOLN
Status: DC | PRN
  Administered 2017-06-03: 3000 mL via INTRAVESICAL

## 2017-06-03 MED ORDER — HYDROMORPHONE HCL 1 MG/ML IJ SOLN
0.5000 mg | INTRAMUSCULAR | Status: DC | PRN
  Administered 2017-06-04: 0.5 mg via INTRAVENOUS
  Filled 2017-06-03: qty 1

## 2017-06-03 MED ORDER — SUGAMMADEX SODIUM 500 MG/5ML IV SOLN
INTRAVENOUS | Status: DC | PRN
  Administered 2017-06-03: 300 mg via INTRAVENOUS

## 2017-06-03 MED ORDER — LACTATED RINGERS IV SOLN
INTRAVENOUS | Status: DC
  Administered 2017-06-03 – 2017-06-04 (×2): via INTRAVENOUS

## 2017-06-03 MED ORDER — SODIUM CHLORIDE 0.9 % IJ SOLN
INTRAMUSCULAR | Status: AC
  Filled 2017-06-03: qty 50

## 2017-06-03 MED ORDER — BUPIVACAINE HCL (PF) 0.5 % IJ SOLN
INTRAMUSCULAR | Status: AC
  Filled 2017-06-03: qty 30

## 2017-06-03 MED ORDER — PROPOFOL 10 MG/ML IV BOLUS
INTRAVENOUS | Status: AC
Start: 2017-06-03 — End: 2017-06-03
  Filled 2017-06-03: qty 40

## 2017-06-03 MED ORDER — ATORVASTATIN CALCIUM 10 MG PO TABS
20.0000 mg | ORAL_TABLET | Freq: Every day | ORAL | Status: DC
  Administered 2017-06-03 – 2017-06-06 (×4): 20 mg via ORAL
  Filled 2017-06-03 (×4): qty 2

## 2017-06-03 MED ORDER — RAMIPRIL 10 MG PO CAPS
10.0000 mg | ORAL_CAPSULE | Freq: Every day | ORAL | Status: DC
  Filled 2017-06-03: qty 1

## 2017-06-03 MED ORDER — ALVIMOPAN 12 MG PO CAPS
12.0000 mg | ORAL_CAPSULE | Freq: Two times a day (BID) | ORAL | Status: DC
  Administered 2017-06-04 – 2017-06-05 (×3): 12 mg via ORAL
  Filled 2017-06-03 (×5): qty 1

## 2017-06-03 MED ORDER — ROCURONIUM BROMIDE 50 MG/5ML IV SOSY
PREFILLED_SYRINGE | INTRAVENOUS | Status: AC
  Filled 2017-06-03: qty 5

## 2017-06-03 MED ORDER — LACTATED RINGERS IV SOLN
INTRAVENOUS | Status: DC
  Administered 2017-06-03 (×3): via INTRAVENOUS

## 2017-06-03 MED ORDER — IOHEXOL 300 MG/ML  SOLN
INTRAMUSCULAR | Status: DC | PRN
  Administered 2017-06-03: 15 mL via URETHRAL

## 2017-06-03 MED ORDER — INSULIN ASPART 100 UNIT/ML ~~LOC~~ SOLN
0.0000 [IU] | Freq: Four times a day (QID) | SUBCUTANEOUS | Status: DC
  Administered 2017-06-04: 2 [IU] via SUBCUTANEOUS
  Administered 2017-06-04: 1 [IU] via SUBCUTANEOUS
  Administered 2017-06-05 (×2): 3 [IU] via SUBCUTANEOUS
  Administered 2017-06-05 (×2): 1 [IU] via SUBCUTANEOUS
  Administered 2017-06-06 (×4): 2 [IU] via SUBCUTANEOUS
  Administered 2017-06-07: 1 [IU] via SUBCUTANEOUS
  Administered 2017-06-07 (×2): 2 [IU] via SUBCUTANEOUS

## 2017-06-03 MED ORDER — ONDANSETRON HCL 4 MG PO TABS: 4.0000 mg | ORAL_TABLET | Freq: Four times a day (QID) | ORAL | Status: DC | PRN

## 2017-06-03 MED ORDER — PANTOPRAZOLE SODIUM 40 MG PO TBEC
40.0000 mg | DELAYED_RELEASE_TABLET | Freq: Every day | ORAL | Status: DC
  Administered 2017-06-04 – 2017-06-07 (×4): 40 mg via ORAL
  Filled 2017-06-03 (×4): qty 1

## 2017-06-03 MED ORDER — CEFOTETAN DISODIUM-DEXTROSE 2-2.08 GM-%(50ML) IV SOLR
2.0000 g | INTRAVENOUS | Status: AC
  Administered 2017-06-03: 2 g via INTRAVENOUS
  Filled 2017-06-03: qty 50

## 2017-06-03 MED ORDER — ACETAMINOPHEN 325 MG PO TABS
650.0000 mg | ORAL_TABLET | Freq: Four times a day (QID) | ORAL | Status: DC
  Administered 2017-06-03 – 2017-06-06 (×13): 650 mg via ORAL
  Filled 2017-06-03 (×14): qty 2

## 2017-06-03 MED ORDER — HYDROMORPHONE HCL 1 MG/ML IJ SOLN
0.2500 mg | INTRAMUSCULAR | Status: DC | PRN
  Administered 2017-06-03 (×2): 0.5 mg via INTRAVENOUS

## 2017-06-03 MED ORDER — ALBUMIN HUMAN 5 % IV SOLN
INTRAVENOUS | Status: AC
  Filled 2017-06-03: qty 500

## 2017-06-03 MED ORDER — METOPROLOL TARTRATE 5 MG/5ML IV SOLN
INTRAVENOUS | Status: AC
  Filled 2017-06-03: qty 5

## 2017-06-03 MED ORDER — ESMOLOL HCL 100 MG/10ML IV SOLN
INTRAVENOUS | Status: DC | PRN
  Administered 2017-06-03 (×2): 15 mg via INTRAVENOUS
  Administered 2017-06-03 (×2): 20 mg via INTRAVENOUS

## 2017-06-03 MED ORDER — SODIUM CHLORIDE 0.9 % IJ SOLN
INTRAMUSCULAR | Status: DC | PRN
  Administered 2017-06-03: 5 mL

## 2017-06-03 MED ORDER — BUPIVACAINE HCL (PF) 0.5 % IJ SOLN
INTRAMUSCULAR | Status: DC | PRN
  Administered 2017-06-03: 5 mL

## 2017-06-03 MED ORDER — INSULIN ASPART 100 UNIT/ML ~~LOC~~ SOLN
0.0000 [IU] | Freq: Four times a day (QID) | SUBCUTANEOUS | Status: DC
  Administered 2017-06-03: 3 [IU] via SUBCUTANEOUS

## 2017-06-03 MED ORDER — OXYCODONE HCL 5 MG PO TABS: 10.0000 mg | ORAL_TABLET | Freq: Four times a day (QID) | ORAL | Status: DC | PRN

## 2017-06-03 MED ORDER — ACETAMINOPHEN 500 MG PO TABS
1000.0000 mg | ORAL_TABLET | ORAL | Status: AC
  Administered 2017-06-03: 1000 mg via ORAL
  Filled 2017-06-03: qty 2

## 2017-06-03 MED ORDER — METRONIDAZOLE 500 MG PO TABS: 1000.0000 mg | ORAL_TABLET | ORAL | Status: DC

## 2017-06-03 MED ORDER — LIDOCAINE 2% (20 MG/ML) 5 ML SYRINGE
INTRAMUSCULAR | Status: DC | PRN
  Administered 2017-06-03: 1 mg/kg/h via INTRAVENOUS

## 2017-06-03 SURGICAL SUPPLY — 88 items
ADAPTER GOLDBERG URETERAL (ADAPTER) ×1 IMPLANT
ADPR CATH 15X14FR FL DRN BG (ADAPTER) ×2
APPLIER CLIP ROT 10 11.4 M/L (STAPLE)
APR CLP MED LRG 11.4X10 (STAPLE)
BAG URO CATCHER STRL LF (MISCELLANEOUS) ×3 IMPLANT
BASKET ZERO TIP NITINOL 2.4FR (BASKET) IMPLANT
BLADE CLIPPER SURG (BLADE) IMPLANT
BSKT STON RTRVL ZERO TP 2.4FR (BASKET)
CATH INTERMIT  6FR 70CM (CATHETERS) IMPLANT
CELLS DAT CNTRL 66122 CELL SVR (MISCELLANEOUS) IMPLANT
CHLORAPREP W/TINT 26ML (MISCELLANEOUS) ×3 IMPLANT
CLIP APPLIE ROT 10 11.4 M/L (STAPLE) IMPLANT
CLOTH BEACON ORANGE TIMEOUT ST (SAFETY) ×3 IMPLANT
COVER FOOTSWITCH UNIV (MISCELLANEOUS) IMPLANT
COVER MAYO STAND STRL (DRAPES) ×6 IMPLANT
COVER SURGICAL LIGHT HANDLE (MISCELLANEOUS) ×9 IMPLANT
DEVICE PMI PUNCTURE CLOSURE (MISCELLANEOUS) ×1 IMPLANT
DRAPE HALF SHEET 40X57 (DRAPES) ×3 IMPLANT
DRAPE UTILITY XL STRL (DRAPES) ×3 IMPLANT
DRAPE WARM FLUID 44X44 (DRAPE) ×3 IMPLANT
DRSG OPSITE POSTOP 4X10 (GAUZE/BANDAGES/DRESSINGS) IMPLANT
DRSG OPSITE POSTOP 4X8 (GAUZE/BANDAGES/DRESSINGS) IMPLANT
ELECT CAUTERY BLADE 6.4 (BLADE) ×6 IMPLANT
ELECT REM PT RETURN 9FT ADLT (ELECTROSURGICAL) ×3
ELECTRODE REM PT RTRN 9FT ADLT (ELECTROSURGICAL) ×2 IMPLANT
GLOVE BIOGEL M STRL SZ7.5 (GLOVE) ×9 IMPLANT
GLOVE BIOGEL PI IND STRL 8 (GLOVE) ×4 IMPLANT
GLOVE BIOGEL PI INDICATOR 8 (GLOVE) ×2
GOWN STRL REUS W/ TWL LRG LVL3 (GOWN DISPOSABLE) ×12 IMPLANT
GOWN STRL REUS W/ TWL XL LVL3 (GOWN DISPOSABLE) ×4 IMPLANT
GOWN STRL REUS W/TWL LRG LVL3 (GOWN DISPOSABLE) ×21 IMPLANT
GOWN STRL REUS W/TWL XL LVL3 (GOWN DISPOSABLE) ×9 IMPLANT
GUIDEWIRE ANG ZIPWIRE 038X150 (WIRE) IMPLANT
GUIDEWIRE STR DUAL SENSOR (WIRE) ×3 IMPLANT
KIT BASIN OR (CUSTOM PROCEDURE TRAY) ×3 IMPLANT
KIT DEFENDO BUTTON (KITS) ×1 IMPLANT
LEGGING LITHOTOMY PAIR STRL (DRAPES) ×3 IMPLANT
MANIFOLD NEPTUNE II (INSTRUMENTS) ×3 IMPLANT
PACK COLON (CUSTOM PROCEDURE TRAY) ×1 IMPLANT
PACK CYSTO (CUSTOM PROCEDURE TRAY) ×3 IMPLANT
PAD ARMBOARD 7.5X6 YLW CONV (MISCELLANEOUS) ×6 IMPLANT
PENCIL BUTTON HOLSTER BLD 10FT (ELECTRODE) ×6 IMPLANT
PORT LAP GEL ALEXIS MED 5-9CM (MISCELLANEOUS) ×1 IMPLANT
RELOAD STAPLE 60 3.6 BLU REG (STAPLE) IMPLANT
RELOAD STAPLER BLUE 60MM (STAPLE) ×4 IMPLANT
RETRACTOR WND ALEXIS 18 MED (MISCELLANEOUS) IMPLANT
RTRCTR WOUND ALEXIS 18CM MED (MISCELLANEOUS)
SCISSORS METZENBAUM CVD 33 (INSTRUMENTS) ×3 IMPLANT
SEALER TISSUE G2 STRG ARTC 35C (ENDOMECHANICALS) ×1 IMPLANT
SET IRRIG TUBING LAPAROSCOPIC (IRRIGATION / IRRIGATOR) IMPLANT
SHEARS HARMONIC ACE PLUS 36CM (ENDOMECHANICALS) ×3 IMPLANT
SLEEVE ENDOPATH XCEL 5M (ENDOMECHANICALS) ×3 IMPLANT
SPECIMEN JAR LARGE (MISCELLANEOUS) ×3 IMPLANT
STAPLER CUT CVD 40MM GREEN (STAPLE) ×1 IMPLANT
STAPLER ECHELON LONG 60 440 (INSTRUMENTS) ×1 IMPLANT
STAPLER RELOAD BLUE 60MM (STAPLE) ×6
STAPLER VISISTAT 35W (STAPLE) ×3 IMPLANT
SUCTION POOLE TIP (SUCTIONS) ×3 IMPLANT
SURGILUBE 2OZ TUBE FLIPTOP (MISCELLANEOUS) IMPLANT
SUT PDS AB 1 TP1 96 (SUTURE) ×6 IMPLANT
SUT PROLENE 2 0 CT2 30 (SUTURE) IMPLANT
SUT PROLENE 2 0 KS (SUTURE) IMPLANT
SUT PROLENE 2 0 SH DA (SUTURE) ×1 IMPLANT
SUT SILK 2 0 SH (SUTURE) ×1 IMPLANT
SUT SILK 2 0 SH CR/8 (SUTURE) ×3 IMPLANT
SUT SILK 2 0 TIES 10X30 (SUTURE) ×3 IMPLANT
SUT SILK 3 0 SH CR/8 (SUTURE) ×4 IMPLANT
SUT SILK 3 0 TIES 10X30 (SUTURE) ×3 IMPLANT
SUT VIC AB 3-0 SH 18 (SUTURE) ×2 IMPLANT
SUT VICRYL 0 UR6 27IN ABS (SUTURE) ×4 IMPLANT
SYR BULB IRRIGATION 50ML (SYRINGE) ×3 IMPLANT
SYRINGE 60CC LL (MISCELLANEOUS) ×1 IMPLANT
SYS LAPSCP GELPORT 120MM (MISCELLANEOUS)
SYSTEM LAPSCP GELPORT 120MM (MISCELLANEOUS) IMPLANT
TOWEL OR 17X26 10 PK STRL BLUE (TOWEL DISPOSABLE) ×6 IMPLANT
TRAY FOLEY W/METER SILVER 16FR (SET/KITS/TRAYS/PACK) ×3 IMPLANT
TRAY PROCTOSCOPIC FIBER OPTIC (SET/KITS/TRAYS/PACK) IMPLANT
TROCAR ADV FIXATION 12X100MM (TROCAR) ×1 IMPLANT
TROCAR XCEL 12X100 BLDLESS (ENDOMECHANICALS) IMPLANT
TROCAR XCEL BLUNT TIP 100MML (ENDOMECHANICALS) IMPLANT
TROCAR XCEL NON-BLD 11X100MML (ENDOMECHANICALS) IMPLANT
TROCAR XCEL NON-BLD 5MMX100MML (ENDOMECHANICALS) ×3 IMPLANT
TUBE CONNECTING 12X1/4 (SUCTIONS) ×5 IMPLANT
TUBING CONNECTING 10 (TUBING) ×5 IMPLANT
TUBING ENDO SMARTCAP PENTAX (MISCELLANEOUS) ×1 IMPLANT
TUBING INSUF HEATED (TUBING) ×3 IMPLANT
TUBING INSUFFLATION (TUBING) ×3 IMPLANT
YANKAUER SUCT BULB TIP NO VENT (SUCTIONS) ×6 IMPLANT

## 2017-06-03 NOTE — Op Note (Signed)
Preoperative diagnosis: Stricture of sigmoid colon, history of gross hematuria Postoperative diagnosis: Same  Procedure: cystoscopy, bilateral retrograde pyelogram, bilateral ureteral stent placement, Foley catheter placement  Surgeon: Grissel Tyrell  Anesthesia: General  Indication for procedure: 72 year old with stricture of the sigmoid colon undergoing sigmoid colectomy today.  Given significant inflammation ureteral stenting was requested.  Patient reports one episode of gross hematuria last year.  No other significant urologic history.  Findings: On exam under anesthesia the penis was circumcised and without mass or lesion.  The testicles were descended bilaterally and palpably normal.  On rectal exam the prostate was about 30 g and smooth without hard area or nodule.  All landmarks were preserved.  On cystoscopy the urethra, prostatic urethra and bladder were unremarkable.  The prostate was not obstructing.  There was clear reflux bilaterally.  No stone or foreign body in the bladder.  Bladder mucosa appeared normal without tumor, erythema or sign of stricture.  Right retrograde pyelogram-this outlined a single ureter single collecting system unit without filling defect, stricture or dilation.  Left retrograde pyelogram-this outlined a single ureter single collecting system unit without filling defect, stricture or dilation.  Description of procedure: After consent was obtained the patient was brought to the operating room.  After adequate anesthesia he was placed in lithotomy position.  I did an exam under anesthesia.  He was prepped and draped in the usual sterile fashion.  A timeout was performed to confirm the patient and procedure.  The cystoscope was passed per urethra and the bladder inspected.  The right ureteral orifice was cannulated with a 6 [place] open-ended catheter and retrograde injection of contrast was performed.  A sensor wire was then advanced into the collecting system and over  the wire the 6 French catheter advanced.  The wire was removed and I backed the catheter out to the right UPJ.  Similarly the left ureteral orifice was cannulated with a 6 [place] open-ended catheter and retrograde injection of contrast performed.  The wire was passed and the catheter over the wire.  The wire was removed and I backed the catheter out to the left UPJ.  The scope was removed.  A Foley catheter was advanced beside the stents.  The stents were labeled and secured to the Foley catheter with silk ties.  Connections were made to the gravity drainage bag and adapter.   Complications: None Blood loss: 0 Specimens: None  Drains: Bilateral 6 x 26 cm open-ended externalized ureteral stents, Foley catheter\  Disposition: Patient turned over to Dr. Dema Severin for his procedure  Recommendations: stents can be removed at the end of the case if no ureteral issues. Foley should be removed post-op when patient is ambulatory.

## 2017-06-03 NOTE — Op Note (Addendum)
PATIENT: [name]  72 y.o. male  Patient Care Team: Cyndi Bender, PA-C as PCP - General (Physician Assistant) Burnell Blanks, MD as Consulting Physician (Cardiology) Philemon Kingdom, MD as Consulting Physician (Internal Medicine) Grace Isaac, MD as Consulting Physician (Cardiothoracic Surgery) Leona Singleton, RN as [place] [facility]  PREOP DIAGNOSIS: Sigmoid stricture, history of recurrent diverticulitis  POSTOP DIAGNOSIS: Same  PROCEDURE: Laparoscopic sigmoid colectomy with end colostomy  SURGEON: Sharon Mt. Dema Severin, MD  ASSISTANT: Romana Juniper, MD  ANESTHESIA: General endotracheal  EBL: 300cc Total I/O In: 2300 [I.V.:2300] Out: 380 [Urine:80; Blood:300]  DRAINS: None  SPECIMEN: Sigmoid colon, stitch marks proximal margin  COUNTS: Sponge, needle and instrument counts were reported correct x2  FINDINGS: Chronically dilated and edematous descending, transverse and ascending colon. Phlegmon and fibrotic mesentery and sigmoid colon at site of presumed prior episodes of diverticulitis; colon distal to this was normal in caliber as was the rectum. Due to the edematous colon proximal to the diseased segment and large size mismatch, a primary colorectal anastomosis was not deemed to be safe. Looking more proximally, the colon was equally dilated extending into the transverse and ascending colon. A laparoscopic Hartmann's procedure was therefore performed  STATEMENT OF MEDICAL NECESSITY: [name] is a very pleasant 72yo gentleman referred to [place] for evaluation of sigmoid colon stricture. over the last 3-4 years he has a history of multiple bouts of "diverticulitis" often manage an outpatient setting with oral antibiotics. His symptoms have been left lower quadrant discomfort, crampiness, occasional chills and his symptoms seemed to resolve in the past with antibiotics. He is unclear as to how many of these episodes he has had.  Approximately 8 weeks ago he had increased left lower quadrant discomfort which again was crampy in nature. He would have a day or 2 with no bowel movements, still passing gas, but then a 3-4 day period of diarrhea. He was given a course of oral antibiotics and his symptoms have steadily resolved. He is no longer having issues with significant discomfort nor problems with bowel activity. He does have 1-2 bowel movements per day and they are soft/formed. He is tolerating regular diet without nausea or vomiting. Imaging workup thus far: CT A/P 06/17/16 - focal wall thickening with pericolonic inflammatory changes of the sigmoid colon likely representing acute diverticulitis. Barium enema 08/17/16: Tight narrowing of the sigmoid colon near the junction with the descending colon consistent with either stricture or tumor. The rectum became [facility] study was terminated and the more proximal colon could not be assessed. Multiple diverticula were seen in the rectosigmoid colon CT A/P 03/10/17: mild pericolonic inflammatory changes most evident involving the mid sigmoid colon where there is a relative narrowing. Small associated adjacent lymph nodes. Colonic malignancy is not excluded but felt less likely. Mid-sigmoid colon changes lead to distention of the colon proximally with moderate increased stool CT A/P 04/06/17: persisting, suspicious soft tissue mass within the sigmoid colon with proximal increase caliber of large bowel  Last full colonoscopy was approximately 47yr ago per the patient and he had polyps that were removed but was told all came back negative for cancer Flex sig 08/2016 - went into splenic flexure, 45cm and no intraluminal abnormalities were noted  CT A/P revealed associated soft tissue thickening in the proximal most portion of the sigmoid colon - inflammatory mass vs neoplasm. The colon proximal to this was dilated consistent with a stricturing process. Clinically, he had no significant  obstructive episodes or symptoms.  In the setting of no mucosal abnormalities, this was felt to be consistent with prior diverticulitis episodes.  Given his history of recurrent diverticulitis which was interfering with quality of life and the underlying stricturing process, he was offered sigmoidectomy. He underwent cardiac clearance prior.  The planned procedure, material risks (including, but not limited to, pain, bleeding, infection, scarring, need for blood transfusion, damage to surrounding structures- blood vessels/nerves/viscus/organs, damage to ureter, urine leak, leak from anastomosis, need for additional procedures, need for stoma which may be permanent, hernia, recurrence, pneumonia, heart attack, stroke, death) benefits and alternatives to surgery were discussed at length. I noted a good probability that the procedure would help improve his symptoms. He and his wife's questions were answered to their satisfaction and they elected to proceed with surgery.  NARRATIVE: Informed consent was verified. The patient was taken to the operating room, placed supine on the operating table and SCD's were applied. General endotracheal anesthesia was induced. The patient was then positioned in the lithotomy position with Allen stirrups.  Urology then scrubbed; the patient was prepped and draped for placement of ureteral stents and cystoscopy.  Antibiotics had been administered.  Cystoscopy/stents was then performed - please refer to Dr. Lyndal Bartlett dictation for details regarding this portion of the procedure.  The patient's abdomen was then prepped and draped in the standard sterile fashion. Surgical timeout confirmed our plan.  Given his history of ventral hernia repair with mesh, entry was obtained via the left upper quadrant.  A Veress needle was inserted and intraperitoneal position confirmed using aspiration and saline drop test.  The abdomen was then insufflated to a maximum pressure of 15 mmHg with  CO2.  A left upper quadrant Optiview port was then placed.  Laparoscope was inserted and visual inspection demonstrated no evidence of trocar site complications.  An omentum containing midline ventral hernia was identified just cephalad to a piece of mesh which appear to be incorporated with fibrous tissue but with edges heaping up.  To facilitate visualization and trocar placement this omentum was reduced into the peritoneal cavity.  The defect was palpated and found to be approximately 1.5 cm x 1.5 cm.  Through this defect at 12 mm balloon trocar was placed.  Additional 30mm balloon ports were placed -2 in the right abdomen and one additional one in the left lower quadrant.  The patient was then positioned in Trendelenburg. The omentum and small bowel was reflected cephalad out of the pelvis.  A lateral to medial approach was utilized.  The descending colon was mobilized up to the splenic flexure along the Haliey Romberg line of Toldt. As the spleen was approached, the patient was repositioned in reverse Trendelenburg.  The peritoneal attachments of the splenic flexure was then taken down up to the level of the greater omentum.  The descending colon was able to be reflected medially to the midline.  Attention was then turned to sigmoid colon mobilization. The patient was repositioned in Trendelenburg.  The sigmoid colon was mobilized off the intersigmoid fossa and care was taken to both identify and protect the left ureter from injury.  The ureter and stent was identified and left in place along with the gonadal vessels - posterior to the mesentery.  The sigmoid mesentery was mobilized. The sigmoid colon was then elevated. The peritoneum overlying the distal branches of the IMA was scored and the peritoneum opened down to the proximal rectum. The distal IMA branches were isolated and location of the left ureter confirmed to be down  and posterior to where the plane of dissection was located. The mesentery including the  distal IMA branches was divided with an EnSeal energy device. The intervening mesentery from the distal descending colon to the proximal rectum was divided with an Enseal device.  Hemostasis was then again verified.  Attention was then turned to extracorporealization.  A Pfannenstiel incision was made approximately 2 finger breadths above the pubic symphysis and carried down to the anterior rectus fascia.  The pyramidalis muscle was divided.  The anterior rectus fascia was then opened transversely.  Kocher clamps were placed on the fascia and the fibroconnective attachments to the anterior rectus fascia were taken down with electrocautery.  Hemostasis was verified.  The midline peritoneum was then incised and the peritoneal cavity entered.  Care was taken to protect the bladder from injury during opening of the peritoneum.  An [place] wound protector was then placed.  The sigmoid colon was grasped and eviscerated from the peritoneal cavity.  The proximal and distal sites of transection were identified.  The proximal site of transection being at the level of the distal descending colon well above the phlegmon.  The distal point of transection being at the proximal rectum.  This corresponded to the location where the tenia splayed, the epiploica appendages terminated and overlying the sacral promontory.  The distal site of transection was done using a contoured stapler, green load.  The entire colon-transverse and descending both were quite dilated with a very thick and edematous wall.  For size comparison, this was roughly twice the size of the metal pursestring device.  Given these findings, the thick wall of the colon and additionally significant size mismatch with the rectum performing a primary stapled colorectal anastomosis was not felt to be safe.  For this reason, the decision was made to proceed with creation of an end colostomy. Proximal to the phlegmonous changes of the sigmoid colon, the colon was divided  with 2 firings of the 18mm GIA blue load stapler after clearing the intervening mesentery using an EnSeal device. The specimen was then passed off with one stitch marking the proximal end of resection.  Approximately 5 cm lateral and slightly inferior to the umbilicus, a stoma site was selected. This corresponded to the location where the colon easily reached the abdominal wall without tension.  An incision was made at this location and a disc of skin was excised along with the underlying subcutaneous tissue.  The anterior rectus sheath was identified and incised in a cruciate manner anteriorly.  The rectus muscle was spread bluntly. A laparotomy pad was placed into the abdomen through Pfannenstiel incision abutting the abdominal wall to protect the underlying contents at the stoma site. The posterior rectus sheath was then incised. The site accommodated two fingers. The stapled end of colon was then identified and passed through the stoma site.  The [place] wound protector was then placed and pneumoperitoneum was reestablished.  The colostomy was examined laparoscopically and found to be oriented appropriately without twisting of the bowel or mesentery.  The small bowel in the left upper quadrant was swept caudad to ensure no loops of bowel are wrapped around the stoma.  The omentum was swept back into the pelvis.  Attention was turned to closing primarily the small ventral hernia through which a 75mm port had been placed.  A laparoscopic suture passer was used and 3 interrupted 0 Vicryl sutures were placed through the fascia and the defect closed.  At this point no air was leaking  from the closure site and palpation of the defect revealed an intact repair.  Attention was then turned to closing the abdomen after ensuring removal of all sponges and ensuring correct counts.  The Alexis wound protector was removed and the peritoneum at the site of the Pfannenstiel incision was then closed with a running 2-0  Vicryl suture.  The rectus fascia was then approximated using two running #1 PDS sutures.  The skin of incision sites was approximated using staples.  Attention was then turned to maturing the colostomy. The colostomy site was draped out with towels.  The staple line was excised using electrocautery and the colostomy was Brooked using 3-0 Vicryl sutures.  The incisions were then dressed with sterile dressings.  A colostomy appliance was then cut and fitted. The ureteral stents at the end of the case were then removed and the Foley catheter left in place.  The patient was then awakened, extubated, and transferred to a stretcher for transport to PACU in satisfactory condition.  An MD assistant was necessary for tissue manipulation, retraction and positioning due to the complexity of the case, obesity of the patient and hospital policies  DISPOSITION: PACU in satisfactory condition  Of note, this dictation was performed utilizing Dragon Dictation - any errors in transcription are unintentional

## 2017-06-03 NOTE — Brief Op Note (Signed)
06/03/2017  12:45 PM  PATIENT:  [name]  72 y.o. male  PRE-OPERATIVE DIAGNOSIS:  Sigmoid stricture diverticula  POST-OPERATIVE DIAGNOSIS:  Sigmoid stricture diverticula  PROCEDURE:  Procedure(s): LAPAROSCOPIC  SIGMOID COLECTOMY, COLOSTOMY (N/A) CYSTOSCOPY WITH STENT PLACEMENT (Bilateral)  SURGEON:  Surgeon(s) and Role: Panel 1:    * Emylie Amster, Sharon Mt, MD - Primary    * Clovis Riley, MD - Assisting Panel 2:    * Festus Aloe, MD - Primary  PHYSICIAN ASSISTANT:   ASSISTANTS: Romana Juniper, MD   ANESTHESIA:   general  EBL:  300 mL   BLOOD ADMINISTERED:none  DRAINS: none   LOCAL MEDICATIONS USED:  Exparel+Marcaine  SPECIMEN:  Sigmoid colon - stitch marks proximal margin  DISPOSITION OF SPECIMEN:  PATHOLOGY  COUNTS:  YES  TOURNIQUET:  * No tourniquets in log *  DICTATION: .Dragon Dictation  PLAN OF CARE: Admit to inpatient - Step-down  PATIENT DISPOSITION:  PACU - hemodynamically stable.

## 2017-06-03 NOTE — Anesthesia Postprocedure Evaluation (Signed)
Anesthesia Post Note  Patient: [name]  Procedure(s) Performed: LAPAROSCOPIC  SIGMOID COLECTOMY, COLOSTOMY (N/A ) CYSTOSCOPY WITH STENT PLACEMENT (Bilateral )     Patient location during evaluation: PACU Anesthesia Type: General Level of consciousness: awake Pain management: pain level controlled Vital Signs Assessment: post-procedure vital signs reviewed and stable Respiratory status: spontaneous breathing Cardiovascular status: stable Postop Assessment: no apparent nausea or vomiting Anesthetic complications: no Comments: Receiving Albumin for low urine output.    Last Vitals:  Vitals:   06/03/17 1330 06/03/17 1345  BP: (!) 151/108 (!) 150/112  Pulse: 86 87  Resp: 16 13  Temp:    SpO2: 98% 99%    Last Pain:  Vitals:   06/03/17 1345  TempSrc:   PainSc: Asleep   Pain Goal:                 Kylii Ennis JR,JOHN Zerina Hallinan

## 2017-06-03 NOTE — Consult Note (Signed)
[facility] Medical Consultation  EH SESAY SPQ:[id number] DOB: [birth year] DOA: 06/03/2017 PCP: Cyndi Bender, PA-C   Requesting physician: Dr. Dema Severin Date of consultation: 06/03/17 Reason for consultation: management of chronic medical problems   Impression/Recommendations 1-sigmoid stricture with recurrent Diverticulitis: -Status post sigmoidectomy with end colostomy. -Further postoperative care and diet advancement per primary service  -currently NPO except for some meds and Ice chips.  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 Please contact us if we can be of further assistance in the meanwhile. Thank you for this consultation.  Chief Complaint: Sigmoid stricture with recurrent diverticulitis   HPI:  72 year old male with a past medical history significant for hypertension, hyperlipidemia, type 2 diabetes mellitus with nephropathy and neuropathy, history of myocardial infarct (status post PCI in 2001), GERD, atrial fibrillation (on Xarelto); who came to the hospital for correction of colonic stricture/recurrent episodes of diverticulosis. S/p sigmoidectomy with colostomy on 06/03/17. [facility] consulted to assist with management of chronic medical problems. Patient tolerated procedure well.   Review of Systems:  Negative  except as otherwise mentioned by HPI.  Past Medical History:  Diagnosis Date  . AAA (abdominal aortic aneurysm) ([place])   . Allergy   . Atrial fibrillation [facility]) October 2013   Xarelto started; duration unknown  . Cataract   . Chronic kidney disease    Stage III   . Coronary artery disease    post bare-metal stent to LAD in 2001  . Diabetes mellitus    type 2   . Hypertension   . Myocardial infarct ([place]) 2001  . Obesity    Past Surgical History:  Procedure Laterality Date  . CARDIAC CATHETERIZATION  2006   stable with primarily nonobstructive disease  . CARDIOVERSION  03/27/2012   Procedure: CARDIOVERSION;  Surgeon: Carlena Bjornstad, MD;  Location: [facility] ENDOSCOPY;  Service: Cardiovascular;  Laterality: N/A;  . CORONARY ANGIOPLASTY WITH STENT PLACEMENT  2001   bare-metal stent to the LAD  . CYSTOSCOPY WITH STENT PLACEMENT Bilateral 06/03/2017   Procedure: CYSTOSCOPY WITH STENT PLACEMENT;  Surgeon: Festus Aloe, MD;  Location: [facility] ORS;  Service: Urology;  Laterality: Bilateral;  . HERNIA REPAIR  2006  . LAPAROSCOPIC SIGMOID COLECTOMY N/A 06/03/2017   Procedure: LAPAROSCOPIC  SIGMOID COLECTOMY, COLOSTOMY;  Surgeon: Ileana Roup, MD;  Location: [facility] ORS;  Service: General;  Laterality: N/A;  . SKIN GRAFT  1981   Social History:  reports that he quit smoking about 18 years ago. His smoking use included cigarettes, pipe, and cigars. He has a 20.00 pack-year smoking history. he has never used smokeless tobacco. He reports that he does not drink alcohol or use drugs.  No Known Allergies Family History  Problem Relation Age of Onset  . Heart disease Father   . Heart attack Father   . ALS Mother   . Diabetes Sister   .  Hypertension Sister   . Heart failure Brother   . Stroke Neg Hx     Prior to Admission medications   Medication Sig Start Date End Date Taking? Authorizing Provider  atorvastatin (LIPITOR) 20 MG tablet Take 20 mg by mouth at bedtime.  03/26/14  Yes [provider]  Dulaglutide (TRULICITY) 1.5 EL/3.8BO SOPN Please inject under skin 1.5 mg weekly Patient taking differently: Inject 1.5 mg into the skin every Sunday. IN THE MORNING. 01/06/17  Yes Philemon Kingdom, MD  ferrous sulfate 325 (65 FE) MG tablet Take 325 mg by mouth 3 (three) times a week.   Yes [provider]  glipiZIDE-metformin (METAGLIP) 2.5-500 MG tablet TAKE TWO TABLETS BY MOUTH  TWICE DAILY BEFORE MEALS Patient taking differently: TAKES ONE TABLET BY MOUTH  TWICE DAILY BEFORE MEALS 12/20/16  Yes Philemon Kingdom, MD  Insulin Isophane & Regular Human (HUMULIN 70/30 MIX) (70-30) 100 UNIT/ML PEN Inject under skin 20 units before b'fast and 20 units before dinner Patient taking differently: Inject 20 Units into the skin 2 (two) times daily before a meal. Inject under skin 20 units before b'fast and 20 units before dinner 04/08/17  Yes Philemon Kingdom, MD  metoprolol tartrate (LOPRESSOR) 25 MG tablet TAKE 1 TABLET BY MOUTH TWO  TIMES DAILY Patient taking differently: TAKE 1 TABLET (25 MG) BY MOUTH TWO TIMES DAILY 03/03/17  Yes Burnell Blanks, MD  omeprazole (PRILOSEC) 20 MG capsule Take 20 mg by mouth daily as needed (for acid reflux/indigestion). Reported on 11/13/2015   Yes [provider]  Probiotic Product (PROBIOTIC-10 PO) Take 1 capsule by mouth every other day.    Yes [provider]  ramipril (ALTACE) 10 MG capsule Take 10 mg by mouth daily.   Yes [provider]  XARELTO 20 MG TABS tablet TAKE 1 TABLET BY MOUTH  DAILY Patient taking differently: TAKE 1 TABLET BY MOUTH  DAILY IN THE EVENING. 01/18/17  Yes Burnell Blanks, MD  Insulin Pen Needle (RELION PEN NEEDLES) 32G X 4 MM MISC Use 2x a day as advised 06/29/16   Philemon Kingdom, MD  nitroGLYCERIN (NITROSTAT) 0.4 MG SL tablet Place 0.4 mg under the tongue every 5 (five) minutes as needed.    [provider]   Physical Exam: Blood pressure (!) 161/109, pulse 81,  temperature 98.4 F (36.9 C), temperature source Oral, resp. rate 17, height 6\' 1"  (1.854 m), weight 99.8 kg (220 lb 0.3 oz), SpO2 96 %. Vitals:   06/03/17 1600 06/03/17 1800  BP: (!) 146/93 (!) 161/109  Pulse: 81   Resp: 17   Temp:  98.4 F (36.9 C)  SpO2: 94% 96%     General: Afebrile, no nausea, no vomiting, denies chest pain and shortness of breath.  Patient on 2 L nasal cannula oxygen supplementation (with O2 sat above 95%). patient is hard of hearing. Reports soreness in his abdomen, but no having much of pain unless he move.  Eyes: PERRLA, wearing corrective glasses, no icterus, no nystagmus.  ENT: Dry mucous membranes, no erythema or in his throat; exudates no otic or nasal discharges.  Neck: No JVD, no thyromegaly, no bruits.  Cardiovascular: Irregular, rate controlled, no rubs, no gallops, no murmurs.  Respiratory: No crackles, good air movement bilaterally, no wheezing.  Abdomen: Soft, mild tenderness with palpation, colostomy on the left side (currently deflated and with just minimal serosanguineous liquid inside).  Skin: New colostomy bag in place, no other open wounds, no erythema, no petechiae, no rashes.  Musculoskeletal: No edema, no cyanosis, no clubbing.  Psychiatric: No hallucinations, no suicidal ideation, patient with good insight.  Neurologic: Alert, awake and oriented x3, slightly slow to response after completely waking up from anesthesia; no focal motor deficit; except for hard of hearing patient with good and intact cranial nerve.  Labs on Admission:  Basic Metabolic Panel: Recent Labs  Lab 05/31/17 1500 06/03/17 1435  NA 140  --   K 3.3*  --   CL 106  --   CO2 26  --   GLUCOSE 131*  --   BUN 14  --   CREATININE 1.26* 1.58*  CALCIUM 8.8*  --    Liver Function Tests: Recent Labs  Lab 05/31/17 1500  AST 19  ALT 13*  ALKPHOS 55  BILITOT 0.5  PROT 6.9  ALBUMIN 3.6   CBC: Recent Labs  Lab 05/31/17 1500 06/03/17 1435  WBC 6.5  14.8*  NEUTROABS 3.5  --   HGB 11.5* 11.2*  HCT 35.8* 34.1*  MCV 95.2 95.3  PLT 196 177   CBG: Recent Labs  Lab 05/31/17 1411 06/03/17 0558 06/03/17 0941 06/03/17 1324  GLUCAP 117* 269* 130* 207*    Radiological Exams on Admission: Dg C-arm 1-60 Min-no Report  Result Date: 06/03/2017 Fluoroscopy was utilized by the requesting physician.  No radiographic interpretation.    EKG:  None   Telemetry: Positive A. Fib, rate controlled.    Time spent: 7 minutes  [facility] Pager [phone]  If 7PM-7AM, please contact night-coverage [email or web address] Password [facility] 06/03/2017, 6:23 PM

## 2017-06-03 NOTE — Anesthesia Procedure Notes (Signed)
Procedure Name: Intubation Date/Time: 06/03/2017 7:38 AM Performed by: West Pugh, CRNA Pre-anesthesia Checklist: Patient identified, Emergency Drugs available, Suction available, Patient being monitored and Timeout performed Patient Re-evaluated:Patient Re-evaluated prior to induction Oxygen Delivery Method: Circle system utilized Preoxygenation: Pre-oxygenation with 100% oxygen Induction Type: IV induction and Cricoid Pressure applied Ventilation: Oral airway inserted - appropriate to patient size and Two handed mask ventilation required Laryngoscope Size: Mac and 4 Grade View: Grade II Tube type: Oral Tube size: 7.5 mm Number of attempts: 2 Airway Equipment and Method: Stylet Placement Confirmation: ETT inserted through vocal cords under direct vision,  positive ETCO2,  CO2 detector and breath sounds checked- equal and bilateral Secured at: 23 cm Tube secured with: Tape Dental Injury: Teeth and Oropharynx as per pre-operative assessment

## 2017-06-03 NOTE — H&P (Signed)
CC: Referred to [place] for evaluation by Dr. Cristina Gong for possible diverticular stricture  HPI: Mr. Coakley is a very pleasant 72yo gentleman referred to [place] for evaluation of sigmoid colon stricture. over the last 3-4 years he has a history of multiple bouts of "diverticulitis" often manage an outpatient setting with oral antibiotics. His symptoms have been left lower quadrant discomfort, crampiness, occasional chills and his symptoms seemed to resolve in the past with antibiotics. He is unclear as to how many of these episodes he has had. Approximately 8 weeks ago he had increased left lower quadrant discomfort which again was crampy in nature. He would have a day or 2 with no bowel movements, still passing gas, but then a 3-4 day period of diarrhea. He was given a course of oral antibiotics and his symptoms have steadily resolved. He is no longer having issues with significant discomfort nor problems with bowel activity. He does have 1-2 bowel movements per day and they are soft/formed. He is tolerating regular diet without nausea or vomiting. Imaging workup thus far: CT A/P 06/17/16 - focal wall thickening with pericolonic inflammatory changes of the sigmoid colon likely representing acute diverticulitis. Barium enema 08/17/16: Tight narrowing of the sigmoid colon near the junction with the descending colon consistent with either stricture or tumor. The rectum became [facility] study was terminated and the more proximal colon could not be assessed. Multiple diverticula were seen in the rectosigmoid colon CT A/P 03/10/17: mild pericolonic inflammatory changes most evident involving the mid sigmoid colon where there is a relative narrowing. Small associated adjacent lymph nodes. Colonic malignancy is not excluded but felt less likely. Mid-sigmoid colon changes lead to distention of the colon proximally with moderate increased stool CT A/P 04/06/17: persisting, suspicious soft tissue mass within the sigmoid  colon with proximal increase caliber of large bowel  Last full colonoscopy was approximately 82yr ago per the patient and he had polyps that were removed but was told all came back negative for cancer Flex sig 08/2016 - went into splenic flexure, 45cm and no intraluminal abnormalities were noted  Today he reports feeling well. Having daily BMs, tolerating diet, denies n/v/abdominal distention.   PMH: DM (controlled on insulin), HTN, HLD, CAD (s/p stent 2001), AFib on Xarelto  PSH: Umbilical/ventral hernia repair with Goretex mesh remotely  FHx: Denies FHx of malignancy  Social: Denies use of tobacco/EtOH/drugs  ROS: A comprehensive 10 system review of systems was completed with the patient and pertinent findings as noted above.   Review of Systems General Present- Weight Loss. Not Present- Appetite Loss, Chills, Fatigue, Fever, Night Sweats and Weight Gain. Note: All other systems negative (unless as noted in HPI & included Review of Systems) Skin Not Present- Change in Wart/Mole, Dryness, Hives, Jaundice, New Lesions, Non-Healing Wounds, Rash and Ulcer. HEENT Not Present- Earache, Hearing Loss, Hoarseness, Nose Bleed, Oral Ulcers, Ringing in the Ears, Seasonal Allergies, Sinus Pain, Sore Throat, Visual Disturbances, Wears glasses/contact lenses and Yellow Eyes. Respiratory Not Present- Bloody sputum, Chronic Cough, Difficulty Breathing, Snoring and Wheezing. Cardiovascular Not Present- Chest Pain, Difficulty Breathing Lying Down, Leg Cramps, Palpitations, Rapid Heart Rate, Shortness of Breath and Swelling of Extremities. Gastrointestinal Present- Abdominal Pain and Change in Bowel Habits. Not Present- Bloating, Bloody Stool, Chronic diarrhea, Constipation, Difficulty Swallowing, Excessive gas, Gets full quickly at meals, Hemorrhoids, Indigestion, Nausea, Rectal Pain and Vomiting. Musculoskeletal Present- Back Pain and Joint Pain. Not Present- Joint Stiffness, Muscle Pain, Muscle  Weakness and Swelling of Extremities. Neurological Not Present- Decreased Memory,  Fainting, Headaches, Numbness, Seizures, Tingling, Tremor, Trouble walking and Weakness. Psychiatric Not Present- Anxiety, Bipolar, Depression, Fearful and Frequent crying. Endocrine Not Present- Cold Intolerance, Excessive Hunger, Hair Changes, Heat Intolerance and New Diabetes. Hematology Present- Blood Thinners. Not Present- Excessive bleeding, Gland problems, HIV and Persistent Infections.   Physical Exam  Vitals:   06/03/17 0550 06/03/17 0624  BP: (!) 146/97   Pulse: 88   Resp: 18   Temp: 98.2 F (36.8 C)   TempSrc: Oral   SpO2: 96%   Weight:  103 kg (227 lb)  Height:  6\' 1"  (1.854 m)    The physical exam findings are as follows: Note:Constitutional: No acute distress; conversant; no deformities Eyes: Moist conjunctiva; no lid lag; anicteric sclerae; pupils equal round and reactive to light Neck: Trachea midline; no palpable thyromegaly Lungs: Normal respiratory effort; no tactile fremitus CV: Irregular rate, irregular rhythm; no palpable thrill; no pitting edema GI: Abdomen soft, nontender, nondistended; no r/g; no palpable hepatosplenomegaly. +Upper midline hernia, freely reducible. MSK: Normal gait; no clubbing/cyanosis Psychiatric: Appropriate affect; alert and oriented 3 Lymphatic: No palpable cervical or axillary lymphadenopathy **A chaperone, Lennart Pall, was present for the entire physical exam  Assessment & Plan  DIVERTICULAR STRICTURE [phone])  Impression: Mr. Yellin is a very pleasant 72yo gentleman with hx of HTN, HLD, hypothyroidism, CAD, AFib on Xarelto - here today for evaluation of likely stricture of the sigmoid colon. Etiology is not completely clear at this point, but favored to be diverticular. There is a soft tissue mass associated with the sigmoid colon which could be 2/2 chronic/recurrent diverticulitis vs neoplastic. The scope 08/2016 that demonstrated no  intraluminal abnormalities up to 45cm is reassurring for this being more of an extrinsic effect/inflammatory mass, but could additionally be neoplastic -OR today for laparoscopic vs open sigmoid colectomy (anterior resection) with cysto/stents -The planned procedure, material risks (including, but not limited to, pain, bleeding, infection, scarring, need for blood transfusion, damage to surrounding structures- blood vessels/nerves/viscus/organs, damage to ureter, urine leak, leak from anastomosis, need for additional procedures, need for stoma which may be permanent, hernia, recurrence, pneumonia, heart attack, stroke, death) benefits and alternatives to surgery were discussed at length. I noted a good probability that the procedure would help improve his symptoms. He and his wife's questions were answered to their satisfaction and they elected to proceed with surgery.

## 2017-06-03 NOTE — Transfer of Care (Signed)
Immediate Anesthesia Transfer of Care Note  Patient: [name]  Procedure(s) Performed: LAPAROSCOPIC  SIGMOID COLECTOMY, COLOSTOMY (N/A ) CYSTOSCOPY WITH STENT PLACEMENT (Bilateral )  Patient Location: PACU  Anesthesia Type:General  Level of Consciousness: drowsy and responds to stimulation  Airway & Oxygen Therapy: Patient Spontanous Breathing and Patient connected to face mask oxygen  Post-op Assessment: Report given to RN and Post -op Vital signs reviewed and stable  Post vital signs: Reviewed and stable  Last Vitals:  Vitals:   06/03/17 0550 06/03/17 1255  BP: (!) 146/97 134/88  Pulse: 88 81  Resp: 18 (!) 26  Temp: 36.8 C 36.6 C  SpO2: 96% 95%    Last Pain:  Vitals:   06/03/17 1255  TempSrc:   PainSc: Asleep         Complications: No apparent anesthesia complications

## 2017-06-03 NOTE — Consult Note (Addendum)
Consult: stricture of sigmoid colon for cystoscopy and placement of bilateral ureteral stents prior to colectomy  Requested by Dr. Dema Severin   History of Present Illness: 72 yo with a likely stricture of sigmoid colon secondary to diverticulitis but possible malignancy. Dr. Dema Severin planning lap vs open colectomy and requested ureteral stenting. Pt denies any significant urologic history or surgery. Denies voiding difficulty, recurrent UTI or dysuria. They do recall an episode of "pink" urine last year but no red urine or clots. CT benign.   Past Medical History:  Diagnosis Date  . AAA (abdominal aortic aneurysm) ([place])   . Allergy   . Atrial fibrillation [facility]) October 2013   Xarelto started; duration unknown  . Cataract   . Chronic kidney disease    Stage III   . Coronary artery disease    post bare-metal stent to LAD in 2001  . Diabetes mellitus    type 2   . Hypertension   . Myocardial infarct ([place]) 2001  . Obesity    Past Surgical History:  Procedure Laterality Date  . CARDIAC CATHETERIZATION  2006   stable with primarily nonobstructive disease  . CARDIOVERSION  03/27/2012   Procedure: CARDIOVERSION;  Surgeon: Carlena Bjornstad, MD;  Location: [facility] ENDOSCOPY;  Service: Cardiovascular;  Laterality: N/A;  . CORONARY ANGIOPLASTY WITH STENT PLACEMENT  2001   bare-metal stent to the LAD  . HERNIA REPAIR  2006  . SKIN GRAFT  1981    Home Medications:  Medications Prior to Admission  Medication Sig Dispense Refill Last Dose  . atorvastatin (LIPITOR) 20 MG tablet Take 20 mg by mouth at bedtime.    Past Week at Unknown time  . Dulaglutide (TRULICITY) 1.5 RS/8.5IO SOPN Please inject under skin 1.5 mg weekly (Patient taking differently: Inject 1.5 mg into the skin every Sunday. IN THE MORNING.) 12 pen 3 05/29/2017  . ferrous sulfate 325 (65 FE) MG tablet Take 325 mg by mouth 3 (three) times a week.   05/30/2017  . glipiZIDE-metformin (METAGLIP) 2.5-500 MG tablet TAKE TWO TABLETS BY MOUTH  TWICE  DAILY BEFORE MEALS (Patient taking differently: TAKES ONE TABLET BY MOUTH  TWICE DAILY BEFORE MEALS) 360 tablet 1 06/02/2017 at 0900  . Insulin Isophane & Regular Human (HUMULIN 70/30 MIX) (70-30) 100 UNIT/ML PEN Inject under skin 20 units before b'fast and 20 units before dinner (Patient taking differently: Inject 20 Units into the skin 2 (two) times daily before a meal. Inject under skin 20 units before b'fast and 20 units before dinner) 45 mL 3 06/02/2017 at 2100  . metoprolol tartrate (LOPRESSOR) 25 MG tablet TAKE 1 TABLET BY MOUTH TWO  TIMES DAILY (Patient taking differently: TAKE 1 TABLET (25 MG) BY MOUTH TWO TIMES DAILY) 180 tablet 2 06/03/2017 at 0430  . omeprazole (PRILOSEC) 20 MG capsule Take 20 mg by mouth daily as needed (for acid reflux/indigestion). Reported on 11/13/2015   06/02/2017 at 0900  . Probiotic Product (PROBIOTIC-10 PO) Take 1 capsule by mouth every other day.    Past Month at Unknown time  . ramipril (ALTACE) 10 MG capsule Take 10 mg by mouth daily.   06/02/2017 at 1900  . XARELTO 20 MG TABS tablet TAKE 1 TABLET BY MOUTH  DAILY (Patient taking differently: TAKE 1 TABLET BY MOUTH  DAILY IN THE EVENING.) 90 tablet 2 1900  . Insulin Pen Needle (RELION PEN NEEDLES) 32G X 4 MM MISC Use 2x a day as advised 200 each 3 Taking  . nitroGLYCERIN (NITROSTAT) 0.4 MG  SL tablet Place 0.4 mg under the tongue every 5 (five) minutes as needed.   Unknown at Unknown time   Allergies: No Known Allergies  Family History  Problem Relation Age of Onset  . Heart disease Father   . Heart attack Father   . ALS Mother   . Diabetes Sister   . Hypertension Sister   . Heart failure Brother   . Stroke Neg Hx    Social History:  reports that he quit smoking about 18 years ago. His smoking use included cigarettes, pipe, and cigars. He has a 20.00 pack-year smoking history. he has never used smokeless tobacco. He reports that he does not drink alcohol or use drugs.  ROS: A complete review of systems was  performed.  All systems are negative except for pertinent findings as noted. Review of Systems  Constitutional: Positive for weight loss.  Gastrointestinal: Positive for abdominal pain.  Musculoskeletal: Positive for back pain and joint pain.  All other systems reviewed and are negative.    Physical Exam:  Vital signs in last 24 hours: Temp:  [98.2 F (36.8 C)] 98.2 F (36.8 C) (01/25 0550) Pulse Rate:  [88] 88 (01/25 0550) Resp:  [18] 18 (01/25 0550) BP: (146)/(97) 146/97 (01/25 0550) SpO2:  [96 %] 96 % (01/25 0550) Weight:  [103 kg (227 lb)] 103 kg (227 lb) (01/25 4580) General:  Alert and oriented, No acute distress HEENT: Normocephalic, atraumatic Cardiovascular: Regular rate and rhythm Lungs: Regular rate and effort Abdomen: Soft, nontender, nondistended, no abdominal masses Extremities: No edema Neurologic: Grossly intact  Laboratory Data:  Results for orders placed or performed during the hospital encounter of 06/03/17 (from the past 24 hour(s))  Glucose, capillary     Status: Abnormal   Collection Time: 06/03/17  5:58 AM  Result Value Ref Range   Glucose-Capillary 269 (H) 65 - 99 mg/dL   No results found for this or any previous visit (from the past 240 hour(s)). Creatinine: Recent Labs    05/31/17 1500  CREATININE 1.26*   I reviewed the CT scan images from 11/28 and 03/10/2017 CT scans.   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   Plan:  I discussed with the patient and his wife the nature, potential benefits, risks and alternatives to cystoscopy, bilateral RGP, bilateral ureteral stent placement, foley catheter placement including side effects of the proposed treatment, the likelihood of the patient achieving the goals of the procedure, and any potential problems that might occur during the procedure or recuperation. All questions answered. [name]. I discussed the patient with Dr. Dema Severin.     [name] 06/03/2017, 7:08 AM

## 2017-06-04 ENCOUNTER — Inpatient Hospital Stay (HOSPITAL_COMMUNITY): Payer: Medicare Other

## 2017-06-04 ENCOUNTER — Other Ambulatory Visit: Payer: Self-pay

## 2017-06-04 LAB — BASIC METABOLIC PANEL
Anion gap: 8 (ref 5–15)
BUN: 18 mg/dL (ref 6–20)
CALCIUM: 8.4 mg/dL — AB (ref 8.9–10.3)
CO2: 25 mmol/L (ref 22–32)
Chloride: 107 mmol/L (ref 101–111)
Creatinine, Ser: 1.53 mg/dL — ABNORMAL HIGH (ref 0.61–1.24)
GFR calc Af Amer: 51 mL/min — ABNORMAL LOW (ref >60)
GFR, EST NON AFRICAN AMERICAN: 44 mL/min — AB (ref >60)
Glucose, Bld: 158 mg/dL — ABNORMAL HIGH (ref 65–99)
POTASSIUM: 3.5 mmol/L (ref 3.5–5.1)
SODIUM: 140 mmol/L (ref 135–145)

## 2017-06-04 LAB — CBC
HCT: 31.1 % — ABNORMAL LOW (ref 39.0–52.0)
Hemoglobin: 10.3 g/dL — ABNORMAL LOW (ref 13.0–17.0)
MCH: 31.2 pg (ref 26.0–34.0)
MCHC: 33.1 g/dL (ref 30.0–36.0)
MCV: 94.2 fL (ref 78.0–100.0)
PLATELETS: 184 10*3/uL (ref 150–400)
RBC: 3.3 MIL/uL — ABNORMAL LOW (ref 4.22–5.81)
RDW: 14.4 % (ref 11.5–15.5)
WBC: 9.7 10*3/uL (ref 4.0–10.5)

## 2017-06-04 LAB — MAGNESIUM: Magnesium: 1.4 mg/dL — ABNORMAL LOW (ref 1.7–2.4)

## 2017-06-04 LAB — PHOSPHORUS: Phosphorus: 3.9 mg/dL (ref 2.5–4.6)

## 2017-06-04 LAB — GLUCOSE, CAPILLARY
GLUCOSE-CAPILLARY: 162 mg/dL — AB (ref 65–99)
GLUCOSE-CAPILLARY: 176 mg/dL — AB (ref 65–99)
GLUCOSE-CAPILLARY: 130 mg/dL — AB (ref 65–99)
Glucose-Capillary: 138 mg/dL — ABNORMAL HIGH (ref 65–99)

## 2017-06-04 MED ORDER — MAGNESIUM SULFATE 2 GM/50ML IV SOLN
2.0000 g | Freq: Once | INTRAVENOUS | Status: AC
  Administered 2017-06-04: 2 g via INTRAVENOUS
  Filled 2017-06-04: qty 50

## 2017-06-04 MED ORDER — SODIUM CHLORIDE 0.9 % IV SOLN
INTRAVENOUS | Status: DC
  Administered 2017-06-04 – 2017-06-05 (×3): via INTRAVENOUS

## 2017-06-04 NOTE — Progress Notes (Signed)
PROGRESS NOTE    [name]  YWV:[id number] DOB: 06/02/1945 DOA: 06/03/2017 PCP: Cyndi Bender, PA-C   Brief Narrative: 72 year old male with a past medical history significant for hypertension, hyperlipidemia, type 2 diabetes mellitus with nephropathy and neuropathy, history of myocardial infarct (status post PCI in 2001), GERD, atrial fibrillation (on Xarelto); who came to the hospital for correction of colonic stricture/recurrent episodes of diverticulosis. S/p sigmoidectomy with colostomy on 06/03/17. Also underwent bilateral ureteral stents by urology .  Assessment & Plan:   Active Problems:   Diverticulitis   Sigmoid stricture with recurrent diverticulitis: Status post sigmoidectomy with end colostomy.  General surgery managing. Further postoperative care and diet advancement as per primary service. Started on clear liquid diet today.  Atrial fibrillation: Xarelto on hold.  Resume once ok  by surgery.  Currently rate is controlled.  Continue IV Lopressor until fully able to tolerate p.o.. Monitor on telemetry.  Coronary artery disease status post PCI: Currently stable.  No chest pain.  Continue beta-blockers and statins  Diabetes mellitus 2: History of diabetic nephropathy neuropathy.  Continue sliding scale insulin and Levemir.  Continue monitoring blood sugars. Continue Neurontin for diabetic neuropathy  GERD: Continue PPI  Hyperlipidemia: Continue statin  Hypertension: Currently blood pressure stable.  Continue to monitor the blood pressure  Acute on CKD stage III: Baseline creatinine ranges from 1.2-1.3.  Continue gentle IV fluids.  We will continue monitor his renal function.  Hold ACE  Hypomagnesemia: Supplemented today.  Check level tomorrow.   DVT prophylaxis: SCD Code Status: Full Family Communication: None at the bedside Disposition Plan: As per surgery   Procedures: Sigmoidectomy, ureteral stent placement., Colostomy  Antimicrobials:None    Subjective:  Patient  seen and examined the bedside this morning.  He was comfortable.  Denies any abdominal pain.  Denied any shortness of breath Objective: Vitals:   06/04/17 0600 06/04/17 0800 06/04/17 1001 06/04/17 1348  BP: (!) 151/81 (!) 156/89 (!) 142/82 (!) 165/119  Pulse: 85 81 92 (!) 101  Resp: 19 19 18 20   Temp:  98.5 F (36.9 C) 97.7 F (36.5 C) 97.8 F (36.6 C)  TempSrc:  Oral Oral Oral  SpO2: 98% 98% 96% 94%  Weight:      Height:        Intake/Output Summary (Last 24 hours) at 06/04/2017 1350 Last data filed at 06/04/2017 1348 Gross per 24 hour  Intake 2815 ml  Output 990 ml  Net 1825 ml   Filed Weights   06/03/17 0624 06/03/17 1500  Weight: 103 kg (227 lb) 99.8 kg (220 lb 0.3 oz)    Examination:  General exam: Appears calm and comfortable ,Not in distress,average built Respiratory system: Bilateral basal crackles  cardiovascular system: S1 & S2 heard, RRR. No JVD, murmurs, rubs, gallops or clicks. No pedal edema. Gastrointestinal system: Abdomen is nondistended, soft and nontender. No organomegaly or masses felt. Normal bowel sounds heard. Colostomy, transverse surgical incision covered with dressing Central nervous system: Alert and oriented. No focal neurological deficits. Extremities: No edema, no clubbing ,no cyanosis, distal peripheral pulses palpable. Skin: No rashes, lesions or ulcers,no icterus ,no pallor Psychiatry: Judgement and insight appear normal. Mood & affect appropriate.     Data Reviewed: I have personally reviewed following labs and imaging studies  CBC: Recent Labs  Lab 05/31/17 1500 06/03/17 1435 06/04/17 0305  WBC 6.5 14.8* 9.7  NEUTROABS 3.5  --   --   HGB 11.5* 11.2* 10.3*  HCT 35.8* 34.1* 31.1*  MCV 95.2  95.3 94.2  PLT 196 177 151   Basic Metabolic Panel: Recent Labs  Lab 05/31/17 1500 06/03/17 1435 06/04/17 0305  NA 140  --  140  K 3.3*  --  3.5  CL 106  --  107  CO2 26  --  25  GLUCOSE 131*  --  158*   BUN 14  --  18  CREATININE 1.26* 1.58* 1.53*  CALCIUM 8.8*  --  8.4*  MG  --   --  1.4*  PHOS  --   --  3.9   GFR: Estimated Creatinine Clearance: 55.1 mL/min (A) (by C-G formula based on SCr of 1.53 mg/dL (H)). Liver Function Tests: Recent Labs  Lab 05/31/17 1500  AST 19  ALT 13*  ALKPHOS 55  BILITOT 0.5  PROT 6.9  ALBUMIN 3.6   No results for input(s): LIPASE, AMYLASE in the last 168 hours. No results for input(s): AMMONIA in the last 168 hours. Coagulation Profile: Recent Labs  Lab 05/31/17 1500  INR 1.05   Cardiac Enzymes: No results for input(s): CKTOTAL, CKMB, CKMBINDEX, TROPONINI in the last 168 hours. BNP (last 3 results) No results for input(s): PROBNP in the last 8760 hours. HbA1C: No results for input(s): HGBA1C in the last 72 hours. CBG: Recent Labs  Lab 06/03/17 1324 06/03/17 1851 06/03/17 2345 06/04/17 0611 06/04/17 1320  GLUCAP 207* 214* 182* 138* 162*   Lipid Profile: No results for input(s): CHOL, HDL, LDLCALC, TRIG, CHOLHDL, LDLDIRECT in the last 72 hours. Thyroid Function Tests: No results for input(s): TSH, T4TOTAL, FREET4, T3FREE, THYROIDAB in the last 72 hours. Anemia Panel: No results for input(s): VITAMINB12, FOLATE, FERRITIN, TIBC, IRON, RETICCTPCT in the last 72 hours. Sepsis Labs: No results for input(s): PROCALCITON, LATICACIDVEN in the last 168 hours.  Recent Results (from the past 240 hour(s))  MRSA PCR Screening     Status: None   Collection Time: 06/03/17  3:06 PM  Result Value Ref Range Status   MRSA by PCR NEGATIVE NEGATIVE Final    Comment:        The GeneXpert MRSA Assay (FDA approved for NASAL specimens only), is one component of a comprehensive MRSA colonization surveillance program. It is not intended to diagnose MRSA infection nor to guide or monitor treatment for MRSA infections.          Radiology Studies: Dg Chest 1 View  Result Date: 06/04/2017 CLINICAL DATA:  Status post colectomy and stent  placement 2 days ago EXAM: CHEST 1 VIEW COMPARISON:  03/21/2012 FINDINGS: Cardiac shadow is mildly prominent accentuated by the portable technique. Mild bibasilar changes are seen. Free air is noted beneath the right hemidiaphragm consistent with the recent colectomy. No focal infiltrate or sizable effusion is seen. No bony abnormality is noted. IMPRESSION: No acute intrathoracic abnormality noted. Mild free air consistent with the recent colectomy. Electronically Signed   By: Inez Catalina M.D.   On: 06/04/2017 11:31   Dg C-arm 1-60 Min-no Report  Result Date: 06/03/2017 Fluoroscopy was utilized by the requesting physician.  No radiographic interpretation.        Scheduled Meds: . acetaminophen  650 mg Oral Q6H  . alvimopan  12 mg Oral BID  . atorvastatin  20 mg Oral QHS  . heparin injection (subcutaneous)  5,000 Units Subcutaneous Q8H  . insulin aspart  0-9 Units Subcutaneous Q6H  . insulin detemir  5 Units Subcutaneous QHS  . mouth rinse  15 mL Mouth Rinse BID  . metoprolol tartrate  5  mg Intravenous Q8H  . pantoprazole  40 mg Oral Daily   Continuous Infusions:   LOS: 1 day    Time spent: 25  mins    Tamira Ryland Jodie Echevaria, MD [facility] Pager [phone]  If 7PM-7AM, please contact night-coverage [email or web address] Password [email or web address] 06/04/2017, 1:50 PM

## 2017-06-04 NOTE — Progress Notes (Signed)
Patient ID: [name], male   DOB: 1945-07-23, 72 y.o.   MRN: [id number] 1 Day Post-Op   Subjective: Comfortable without complaints.  Wants something to drink.  Abdomen is just "sore"  Objective: Vital signs in last 24 hours: Temp:  [97.2 F (36.2 C)-98.9 F (37.2 C)] 98.5 F (36.9 C) (01/26 0353) Pulse Rate:  [74-96] 85 (01/26 0600) Resp:  [11-26] 19 (01/26 0600) BP: (132-175)/(78-117) 151/81 (01/26 0600) SpO2:  [94 %-99 %] 98 % (01/26 0600) Weight:  [99.8 kg (220 lb 0.3 oz)] 99.8 kg (220 lb 0.3 oz) (01/25 1500) Last BM Date: 06/02/17  Intake/Output from previous day: 01/25 0701 - 01/26 0700 In: 4695 [P.O.:120; I.V.:4075; IV Piggyback:500] Out: 1370 [Urine:1070; Blood:300] Intake/Output this shift: No intake/output data recorded.  General appearance: alert, cooperative and no distress Resp: clear to auscultation bilaterally GI: Soft with mild appropriate tenderness and nondistended.  Stoma appears healthy. Incision/Wound: Clean and dry  Lab Results:  Recent Labs    06/03/17 1435 06/04/17 0305  WBC 14.8* 9.7  HGB 11.2* 10.3*  HCT 34.1* 31.1*  PLT 177 184   BMET Recent Labs    06/03/17 1435 06/04/17 0305  NA  --  140  K  --  3.5  CL  --  107  CO2  --  25  GLUCOSE  --  158*  BUN  --  18  CREATININE 1.58* 1.53*  CALCIUM  --  8.4*   CBG (last 3)  Recent Labs    06/03/17 1851 06/03/17 2345 06/04/17 0611  GLUCAP 214* 182* 138*     Studies/Results: Dg C-arm 1-60 Min-no Report  Result Date: 06/03/2017 Fluoroscopy was utilized by the requesting physician.  No radiographic interpretation.    Anti-infectives: Anti-infectives (From admission, onward)   Start     Dose/Rate Route Frequency Ordered Stop   06/03/17 0602  cefoTEtan in Dextrose 5% (CEFOTAN) IVPB 2 g     2 g Intravenous On call to O.R. 06/03/17 0602 06/03/17 0804   06/03/17 0602  neomycin (MYCIFRADIN) tablet 1,000 mg  Status:  Discontinued     1,000 mg Oral 3 times per day 06/03/17 0602  06/03/17 0604   06/03/17 0602  metroNIDAZOLE (FLAGYL) tablet 1,000 mg  Status:  Discontinued     1,000 mg Oral 3 times per day 06/03/17 0602 06/03/17 0604      Assessment/Plan: s/p Procedure(s): LAPAROSCOPIC  SIGMOID COLECTOMY, COLOSTOMY CYSTOSCOPY WITH STENT PLACEMENT Stable postoperatively.  Start clear liquid diet.  Transfer to floor.  Patient already getting out of bed. Diabetes mellitus.  Blood sugar under good control and medicine following.   LOS: 1 day    Edward Jolly 06/04/2017

## 2017-06-04 NOTE — Evaluation (Signed)
Physical Therapy Evaluation Patient Details Name: [name] MRN: [id number] DOB: [birth year] Today's Date: 06/04/2017   History of Present Illness  Pt is a 72 year old male s/p LAPAROSCOPIC SIGMOID COLECTOMY, COLOSTOMY on 05/24/17  Clinical Impression  Pt admitted with above diagnosis. Pt currently with functional limitations due to the deficits listed below (see PT Problem List).  Pt will benefit from skilled PT to increase their independence and safety with mobility to allow discharge to the venue listed below.   Pt assisted with ambulating in hallway and progressing well POD #1.  Pt plans to return home with spouse upon d/c.     Follow Up Recommendations No PT follow up    Equipment Recommendations  Rolling walker with 5" wheels    Recommendations for Other Services       Precautions / Restrictions Precautions Precautions: Fall      Mobility  Bed Mobility               General bed mobility comments: pt up in recliner on arrival  Transfers Overall transfer level: Needs assistance Equipment used: Rolling walker (2 wheeled) Transfers: Sit to/from Stand Sit to Stand: Min guard         General transfer comment: min/guard for safety, cues for hand placement  Ambulation/Gait Ambulation/Gait assistance: Min guard Ambulation Distance (Feet): 120 Feet Assistive device: Rolling walker (2 wheeled) Gait Pattern/deviations: Step-through pattern;Decreased stride length     General Gait Details: verbal cues for RW Positioning and posture, HR up to 120 bpm  Stairs            Wheelchair Mobility    Modified Rankin (Stroke Patients Only)       Balance Overall balance assessment: Needs assistance         Standing balance support: Bilateral upper extremity supported Standing balance-Leahy Scale: Poor Standing balance comment: requires UE support today                             Pertinent Vitals/Pain Pain Assessment: 0-10 Pain Score: 3   Pain Location: abdomen Pain Descriptors / Indicators: Aching;Sore Pain Intervention(s): Limited activity within patient's tolerance;Repositioned;Monitored during session    [place] expects to be discharged to:: Private residence Living Arrangements: Spouse/significant other   Type of Home: House Home Access: Stairs to enter   Technical brewer of Steps: 2 Home Layout: One level Home Equipment: None      Prior Function Level of Independence: Independent               Hand Dominance        Extremity/Trunk Assessment        Lower Extremity Assessment Lower Extremity Assessment: Generalized weakness       Communication   Communication: No difficulties  Cognition Arousal/Alertness: Awake/alert Behavior During Therapy: WFL for tasks assessed/performed Overall Cognitive Status: Within Functional Limits for tasks assessed                                        General Comments      Exercises     Assessment/Plan    PT Assessment Patient needs continued PT services  PT Problem List Decreased mobility;Decreased strength;Decreased activity tolerance;Decreased knowledge of use of DME       PT Treatment Interventions Gait training;Therapeutic exercise;Therapeutic activities;DME instruction;Patient/family education;Functional mobility training;Stair training  PT Goals (Current goals can be found in the Care Plan section)  Acute Rehab PT Goals PT Goal Formulation: With patient Time For Goal Achievement: 06/11/17 Potential to Achieve Goals: Good    Frequency Min 3X/week   Barriers to discharge        Co-evaluation               AM-PAC PT "6 Clicks" Daily Activity  Outcome Measure Difficulty turning over in bed (including adjusting bedclothes, sheets and blankets)?: A Little Difficulty moving from lying on back to sitting on the side of the bed? : A Little Difficulty sitting down on and standing up from a  chair with arms (e.g., wheelchair, bedside commode, etc,.)?: A Lot Help needed moving to and from a bed to chair (including a wheelchair)?: A Little Help needed walking in hospital room?: A Little Help needed climbing 3-5 steps with a railing? : A Little 6 Click Score: 17    End of Session Equipment Utilized During Treatment: Gait belt Activity Tolerance: Patient tolerated treatment well Patient left: with nursing/sitter in room;Other (comment)(in wheelchair to transfer to next floor)   PT Visit Diagnosis: Difficulty in walking, not elsewhere classified (R26.2)    Time: 1165-7903 PT Time Calculation (min) (ACUTE ONLY): 13 min   Charges:   PT Evaluation $PT Eval Low Complexity: 1 Low     PT G Codes:      Carmelia Bake, PT, DPT 06/04/2017 Pager: [phone]   York Ram E 06/04/2017, 12:13 PM

## 2017-06-04 NOTE — Progress Notes (Signed)
Pt has arrived to room 1506 from ICU at this time. VS WNL. Pt without c/o.

## 2017-06-04 NOTE — Evaluation (Signed)
Occupational Therapy Evaluation Patient Details Name: [name] MRN: [id number] DOB: [birth year] Today's Date: 06/04/2017    History of Present Illness Pt is a 71 year old male s/p LAPAROSCOPIC SIGMOID COLECTOMY, COLOSTOMY on 05/24/17   Clinical Impression   Pt admitted for surgery. Pt currently with functional limitations due to the deficits listed below (see OT Problem List).  Pt will benefit from skilled OT to increase their safety and independence with ADL and functional mobility for ADL to facilitate discharge to venue listed below.      Follow Up Recommendations  No OT follow up    Equipment Recommendations  3 in 1 bedside commode    Recommendations for Other Services       Precautions / Restrictions Precautions Precautions: Fall      Mobility Bed Mobility               General bed mobility comments: pt up in recliner on arrival  Transfers Overall transfer level: Needs assistance Equipment used: Rolling walker (2 wheeled) Transfers: Sit to/from Stand Sit to Stand: Min assist         General transfer comment: min/guard for safety, cues for hand placement    Balance Overall balance assessment: Needs assistance         Standing balance support: Bilateral upper extremity supported Standing balance-Leahy Scale: Poor Standing balance comment: requires UE support today                           ADL either performed or assessed with clinical judgement   ADL Overall ADL's : Needs assistance/impaired Eating/Feeding: Set up;Sitting   Grooming: Set up;Sitting   Upper Body Bathing: Minimal assistance;Sitting   Lower Body Bathing: Moderate assistance;Sit to/from stand;Cueing for sequencing;Cueing for safety    Upper Body Dressing : Minimal assistance;Sitting   Lower Body Dressing: Moderate assistance;Sit to/from stand;Cueing for sequencing;Cueing for safety   Toilet Transfer: Minimal assistance;BSC;RW   Toileting- Clothing  Manipulation and Hygiene: Moderate assistance;Sit to/from stand         General ADL Comments: Pt so fatigued.  Pt had 6 family members in the room.  Rn and OT educated pt and family on benefit of pt resting.       Vision Patient Visual Report: No change from baseline       Perception     Praxis      Pertinent Vitals/Pain Pain Assessment: 0-10 Pain Score: 4  Pain Location: abdomen Pain Descriptors / Indicators: Aching;Sore Pain Intervention(s): Limited activity within patient's tolerance;Monitored during session;Repositioned     Hand Dominance     Extremity/Trunk Assessment Upper Extremity Assessment Upper Extremity Assessment: Generalized weakness   Lower Extremity Assessment Lower Extremity Assessment: Generalized weakness       Communication Communication Communication: No difficulties   Cognition Arousal/Alertness: Awake/alert Behavior During Therapy: WFL for tasks assessed/performed Overall Cognitive Status: Within Functional Limits for tasks assessed                                                Home Living Family/patient expects to be discharged to:: Private residence Living Arrangements: Spouse/significant other   Type of Home: House Home Access: Stairs to enter [employer] of Steps: 2   Home Layout: One level     Bathroom Shower/Tub: Teacher, early years/pre: Standard  Home Equipment: None          Prior Functioning/Environment Level of Independence: Independent                 OT Problem List: Decreased strength;Impaired balance (sitting and/or standing);Decreased safety awareness;Decreased knowledge of use of DME or AE;Decreased activity tolerance;Pain      OT Treatment/Interventions: Self-care/ADL training;Patient/family education;DME and/or AE instruction;Therapeutic activities    OT Goals(Current goals can be found in the care plan section) Acute Rehab OT Goals Patient Stated Goal:  get well OT Goal Formulation: With patient Time For Goal Achievement: 06/18/17 Potential to Achieve Goals: Good  OT Frequency: Min 2X/week   Barriers to D/C:            Co-evaluation              AM-PAC PT "6 Clicks" Daily Activity     Outcome Measure Help from another person eating meals?: None Help from another person taking care of personal grooming?: None Help from another person toileting, which includes using toliet, bedpan, or urinal?: A Little Help from another person bathing (including washing, rinsing, drying)?: A Little Help from another person to put on and taking off regular upper body clothing?: A Little Help from another person to put on and taking off regular lower body clothing?: A Little 6 Click Score: 20   End of Session Nurse Communication: Mobility status  Activity Tolerance: Patient tolerated treatment well Patient left: in chair  OT Visit Diagnosis: Unsteadiness on feet (R26.81);Muscle weakness (generalized) (M62.81)                Time: 2637-8588 OT Time Calculation (min): 22 min Charges:  OT General Charges $OT Visit: 1 Visit OT Evaluation $OT Eval Moderate Complexity: 1 Mod G-Codes:     Kari Baars, Paul  Payton Mccallum D 06/04/2017, 2:47 PM

## 2017-06-05 LAB — BASIC METABOLIC PANEL
Anion gap: 8 (ref 5–15)
BUN: 9 mg/dL (ref 6–20)
CHLORIDE: 107 mmol/L (ref 101–111)
CO2: 25 mmol/L (ref 22–32)
Calcium: 8.2 mg/dL — ABNORMAL LOW (ref 8.9–10.3)
Creatinine, Ser: 1.05 mg/dL (ref 0.61–1.24)
GFR calc Af Amer: >60 mL/min (ref >60)
GFR calc non Af Amer: >60 mL/min (ref >60)
GLUCOSE: 116 mg/dL — AB (ref 65–99)
POTASSIUM: 3.2 mmol/L — AB (ref 3.5–5.1)
Sodium: 140 mmol/L (ref 135–145)

## 2017-06-05 LAB — GLUCOSE, CAPILLARY
GLUCOSE-CAPILLARY: 115 mg/dL — AB (ref 65–99)
GLUCOSE-CAPILLARY: 122 mg/dL — AB (ref 65–99)
GLUCOSE-CAPILLARY: 172 mg/dL — AB (ref 65–99)
GLUCOSE-CAPILLARY: 202 mg/dL — AB (ref 65–99)
GLUCOSE-CAPILLARY: 219 mg/dL — AB (ref 65–99)
Glucose-Capillary: 121 mg/dL — ABNORMAL HIGH (ref 65–99)

## 2017-06-05 LAB — CBC
HEMATOCRIT: 31.8 % — AB (ref 39.0–52.0)
Hemoglobin: 10.4 g/dL — ABNORMAL LOW (ref 13.0–17.0)
MCH: 31 pg (ref 26.0–34.0)
MCHC: 32.7 g/dL (ref 30.0–36.0)
MCV: 94.9 fL (ref 78.0–100.0)
Platelets: 164 10*3/uL (ref 150–400)
RBC: 3.35 MIL/uL — ABNORMAL LOW (ref 4.22–5.81)
RDW: 14.4 % (ref 11.5–15.5)
WBC: 7.1 10*3/uL (ref 4.0–10.5)

## 2017-06-05 LAB — MAGNESIUM: Magnesium: 1.7 mg/dL (ref 1.7–2.4)

## 2017-06-05 LAB — PHOSPHORUS: Phosphorus: 2.1 mg/dL — ABNORMAL LOW (ref 2.5–4.6)

## 2017-06-05 MED ORDER — METOPROLOL TARTRATE 25 MG PO TABS
25.0000 mg | ORAL_TABLET | Freq: Two times a day (BID) | ORAL | Status: DC
  Administered 2017-06-05 – 2017-06-07 (×5): 25 mg via ORAL
  Filled 2017-06-05 (×5): qty 1

## 2017-06-05 MED ORDER — POTASSIUM PHOSPHATE MONOBASIC 500 MG PO TABS
500.0000 mg | ORAL_TABLET | Freq: Three times a day (TID) | ORAL | Status: DC
  Administered 2017-06-05 – 2017-06-07 (×9): 500 mg via ORAL
  Filled 2017-06-05 (×10): qty 1

## 2017-06-05 MED ORDER — POTASSIUM CHLORIDE CRYS ER 20 MEQ PO TBCR
40.0000 meq | EXTENDED_RELEASE_TABLET | Freq: Once | ORAL | Status: AC
  Administered 2017-06-05: 40 meq via ORAL
  Filled 2017-06-05: qty 2

## 2017-06-05 NOTE — Progress Notes (Signed)
Patient ID: [name], male   DOB: Dec 17, 1945, 72 y.o.   MRN: [id number] 2 Days Post-Op   Subjective: No complaints this morning.  Hungry.  Objective: Vital signs in last 24 hours: Temp:  [97.6 F (36.4 C)-98.1 F (36.7 C)] 97.6 F (36.4 C) (01/27 0522) Pulse Rate:  [92-104] 97 (01/27 0522) Resp:  [18-20] 18 (01/27 0522) BP: (137-165)/(80-119) 137/94 (01/27 0522) SpO2:  [93 %-96 %] 93 % (01/27 0522) Last BM Date: 06/02/17  Intake/Output from previous day: 01/26 0701 - 01/27 0700 In: 1745 [P.O.:360; I.V.:1385] Out: 3550 [Urine:3550] Intake/Output this shift: Total I/O In: -  Out: 200 [Urine:200]  General appearance: alert, cooperative and no distress GI: Soft with minimal incisional tenderness.  Nondistended. Incision/Wound: No erythema or drainage.  Lab Results:  Recent Labs    06/04/17 0305 06/05/17 0610  WBC 9.7 7.1  HGB 10.3* 10.4*  HCT 31.1* 31.8*  PLT 184 164   BMET Recent Labs    06/04/17 0305 06/05/17 0610  NA 140 140  K 3.5 3.2*  CL 107 107  CO2 25 25  GLUCOSE 158* 116*  BUN 18 9  CREATININE 1.53* 1.05  CALCIUM 8.4* 8.2*     Studies/Results: Dg Chest 1 View  Result Date: 06/04/2017 CLINICAL DATA:  Status post colectomy and stent placement 2 days ago EXAM: CHEST 1 VIEW COMPARISON:  03/21/2012 FINDINGS: Cardiac shadow is mildly prominent accentuated by the portable technique. Mild bibasilar changes are seen. Free air is noted beneath the right hemidiaphragm consistent with the recent colectomy. No focal infiltrate or sizable effusion is seen. No bony abnormality is noted. IMPRESSION: No acute intrathoracic abnormality noted. Mild free air consistent with the recent colectomy. Electronically Signed   By: Inez Catalina M.D.   On: 06/04/2017 11:31    Anti-infectives: Anti-infectives (From admission, onward)   Start     Dose/Rate Route Frequency Ordered Stop   06/03/17 0602  cefoTEtan in Dextrose 5% (CEFOTAN) IVPB 2 g     2 g Intravenous On call  to O.R. 06/03/17 0602 06/03/17 0804   06/03/17 0602  neomycin (MYCIFRADIN) tablet 1,000 mg  Status:  Discontinued     1,000 mg Oral 3 times per day 06/03/17 0602 06/03/17 0604   06/03/17 0602  metroNIDAZOLE (FLAGYL) tablet 1,000 mg  Status:  Discontinued     1,000 mg Oral 3 times per day 06/03/17 0602 06/03/17 0604      Assessment/Plan: s/p Procedure(s): LAPAROSCOPIC  SIGMOID COLECTOMY, COLOSTOMY CYSTOSCOPY WITH STENT PLACEMENT Doing very well postoperatively.  Diet advanced.  Activity encouraged.    LOS: 2 days    Edward Jolly 06/05/2017

## 2017-06-05 NOTE — Progress Notes (Signed)
PROGRESS NOTE    [name]  VHQ:[id number] DOB: 1945/09/21 DOA: 06/03/2017 PCP: Cyndi Bender, PA-C   Brief Narrative: 72 year old male with a past medical history significant for hypertension, hyperlipidemia, type 2 diabetes mellitus with nephropathy and neuropathy, history of myocardial infarct (status post PCI in 2001), GERD, atrial fibrillation (on Xarelto); who came to the hospital for correction of colonic stricture/recurrent episodes of diverticulosis. S/p sigmoidectomy with colostomy on 06/03/17. Also underwent bilateral ureteral stents by urology .  Assessment & Plan:   Active Problems:   Diverticulitis   Sigmoid stricture with recurrent diverticulitis: Status post sigmoidectomy with end colostomy.  General surgery managing. Diet advanced to soft today  Atrial fibrillation: Xarelto on hold.  Resume once ok  by surgery.  Currently rate is controlled.  Continue PO Lopressor now.Monitor on telemetry.  Coronary artery disease status post PCI: Currently stable.  No chest pain.  Continue beta-blockers and statins  Diabetes mellitus 2: History of diabetic nephropathy neuropathy.  Continue sliding scale insulin and Levemir.  Continue monitoring blood sugars. Continue Neurontin for diabetic neuropathy  GERD: Continue PPI  Hyperlipidemia: Continue statin  Hypertension: Currently blood pressure stable.  Continue to monitor the blood pressure  Acute on CKD stage UXL:KGMW improved. Baseline creatinine ranges from 1.2-1.3.  Continue gentle IV fluids.  Hold ACE  Hypokalemia/hypophosphatemia: Supplemented today.  Check level tomorrow.   DVT prophylaxis: SCD Code Status: Full Family Communication: None at the bedside Disposition Plan: As per surgery   Procedures: Sigmoidectomy, ureteral stent placement., Colostomy  Antimicrobials:None   Subjective:  Patient seen and examined the bedside this morning.  Remains comfortable.  No new issues.  Kidney function has improved.   Patient tolerating diet .  objective: Vitals:   06/04/17 1001 06/04/17 1348 06/04/17 2100 06/05/17 0522  BP: (!) 142/82 (!) 165/119 (!) 145/80 (!) 137/94  Pulse: 92 (!) 101 (!) 104 97  Resp: 18 20 18 18   Temp: 97.7 F (36.5 C) 97.8 F (36.6 C) 98.1 F (36.7 C) 97.6 F (36.4 C)  TempSrc: Oral Oral Oral Oral  SpO2: 96% 94% 96% 93%  Weight:      Height:        Intake/Output Summary (Last 24 hours) at 06/05/2017 1330 Last data filed at 06/05/2017 1030 Gross per 24 hour  Intake 2005 ml  Output 5100 ml  Net -3095 ml   Filed Weights   06/03/17 0624 06/03/17 1500  Weight: 103 kg (227 lb) 99.8 kg (220 lb 0.3 oz)    Examination:  General exam: Appears calm and comfortable ,Not in distress,average built Respiratory system: Bilateral equal air entry, normal vesicular breath sounds, no wheezes or crackles  Cardiovascular system: S1 & S2 heard, RRR. No JVD, murmurs, rubs, gallops or clicks.  Gastrointestinal system:Abdomen is nondistended, soft and nontender. No organomegaly or masses felt. Normal bowel sounds heard. Colostomy, transverse surgical incision covered with dressing Central nervous system: Alert and oriented. No focal neurological deficits. Extremities: No edema, no clubbing ,no cyanosis, distal peripheral pulses palpable. Skin: No cyanosis,No pallor,No Rash,No Ulcer Psychiatry: Judgement and insight appear normal. Mood & affect appropriate.  GU: No Foley   Data Reviewed: I have personally reviewed following labs and imaging studies  CBC: Recent Labs  Lab 05/31/17 1500 06/03/17 1435 06/04/17 0305 06/05/17 0610  WBC 6.5 14.8* 9.7 7.1  NEUTROABS 3.5  --   --   --   HGB 11.5* 11.2* 10.3* 10.4*  HCT 35.8* 34.1* 31.1* 31.8*  MCV 95.2 95.3 94.2 94.9  PLT  196 177 184 478   Basic Metabolic Panel: Recent Labs  Lab 05/31/17 1500 06/03/17 1435 06/04/17 0305 06/05/17 0610  NA 140  --  140 140  K 3.3*  --  3.5 3.2*  CL 106  --  107 107  CO2 26  --  25 25    GLUCOSE 131*  --  158* 116*  BUN 14  --  18 9  CREATININE 1.26* 1.58* 1.53* 1.05  CALCIUM 8.8*  --  8.4* 8.2*  MG  --   --  1.4* 1.7  PHOS  --   --  3.9 2.1*   GFR: Estimated Creatinine Clearance: 80.2 mL/min (by C-G formula based on SCr of 1.05 mg/dL). Liver Function Tests: Recent Labs  Lab 05/31/17 1500  AST 19  ALT 13*  ALKPHOS 55  BILITOT 0.5  PROT 6.9  ALBUMIN 3.6   No results for input(s): LIPASE, AMYLASE in the last 168 hours. No results for input(s): AMMONIA in the last 168 hours. Coagulation Profile: Recent Labs  Lab 05/31/17 1500  INR 1.05   Cardiac Enzymes: No results for input(s): CKTOTAL, CKMB, CKMBINDEX, TROPONINI in the last 168 hours. BNP (last 3 results) No results for input(s): PROBNP in the last 8760 hours. HbA1C: No results for input(s): HGBA1C in the last 72 hours. CBG: Recent Labs  Lab 06/04/17 2208 06/05/17 0042 06/05/17 0633 06/05/17 0743 06/05/17 1231  GLUCAP 130* 122* 121* 115* 219*   Lipid Profile: No results for input(s): CHOL, HDL, LDLCALC, TRIG, CHOLHDL, LDLDIRECT in the last 72 hours. Thyroid Function Tests: No results for input(s): TSH, T4TOTAL, FREET4, T3FREE, THYROIDAB in the last 72 hours. Anemia Panel: No results for input(s): VITAMINB12, FOLATE, FERRITIN, TIBC, IRON, RETICCTPCT in the last 72 hours. Sepsis Labs: No results for input(s): PROCALCITON, LATICACIDVEN in the last 168 hours.  Recent Results (from the past 240 hour(s))  MRSA PCR Screening     Status: None   Collection Time: 06/03/17  3:06 PM  Result Value Ref Range Status   MRSA by PCR NEGATIVE NEGATIVE Final    Comment:        The GeneXpert MRSA Assay (FDA approved for NASAL specimens only), is one component of a comprehensive MRSA colonization surveillance program. It is not intended to diagnose MRSA infection nor to guide or monitor treatment for MRSA infections.          Radiology Studies: Dg Chest 1 View  Result Date: 06/04/2017 CLINICAL  DATA:  Status post colectomy and stent placement 2 days ago EXAM: CHEST 1 VIEW COMPARISON:  03/21/2012 FINDINGS: Cardiac shadow is mildly prominent accentuated by the portable technique. Mild bibasilar changes are seen. Free air is noted beneath the right hemidiaphragm consistent with the recent colectomy. No focal infiltrate or sizable effusion is seen. No bony abnormality is noted. IMPRESSION: No acute intrathoracic abnormality noted. Mild free air consistent with the recent colectomy. Electronically Signed   By: Inez Catalina M.D.   On: 06/04/2017 11:31        Scheduled Meds: . acetaminophen  650 mg Oral Q6H  . alvimopan  12 mg Oral BID  . atorvastatin  20 mg Oral QHS  . heparin injection (subcutaneous)  5,000 Units Subcutaneous Q8H  . insulin aspart  0-9 Units Subcutaneous Q6H  . insulin detemir  5 Units Subcutaneous QHS  . mouth rinse  15 mL Mouth Rinse BID  . metoprolol tartrate  5 mg Intravenous Q8H  . pantoprazole  40 mg Oral Daily  . potassium  phosphate (monobasic)  500 mg Oral TID WC & HS   Continuous Infusions: . sodium chloride 50 mL/hr at 06/05/17 0830     LOS: 2 days    Time spent: 25  mins    Roosvelt Churchwell Jodie Echevaria, MD [facility] Pager [phone]  If 7PM-7AM, please contact night-coverage [email or web address] Password [email or web address] 06/05/2017, 1:30 PM

## 2017-06-05 NOTE — Plan of Care (Signed)
  Elimination: Will not experience complications related to bowel motility 06/05/2017 2237 - Progressing by Mickie Kay, RN

## 2017-06-06 ENCOUNTER — Encounter (HOSPITAL_COMMUNITY): Payer: Self-pay | Admitting: Internal Medicine

## 2017-06-06 LAB — BASIC METABOLIC PANEL
ANION GAP: 7 (ref 5–15)
BUN: 9 mg/dL (ref 6–20)
CHLORIDE: 108 mmol/L (ref 101–111)
CO2: 25 mmol/L (ref 22–32)
CREATININE: 0.98 mg/dL (ref 0.61–1.24)
Calcium: 8.5 mg/dL — ABNORMAL LOW (ref 8.9–10.3)
GFR calc non Af Amer: >60 mL/min (ref >60)
Glucose, Bld: 144 mg/dL — ABNORMAL HIGH (ref 65–99)
POTASSIUM: 3.6 mmol/L (ref 3.5–5.1)
Sodium: 140 mmol/L (ref 135–145)

## 2017-06-06 LAB — GLUCOSE, CAPILLARY
GLUCOSE-CAPILLARY: 177 mg/dL — AB (ref 65–99)
GLUCOSE-CAPILLARY: 122 mg/dL — AB (ref 65–99)
Glucose-Capillary: 155 mg/dL — ABNORMAL HIGH (ref 65–99)
Glucose-Capillary: 176 mg/dL — ABNORMAL HIGH (ref 65–99)
Glucose-Capillary: 179 mg/dL — ABNORMAL HIGH (ref 65–99)
Glucose-Capillary: 183 mg/dL — ABNORMAL HIGH (ref 65–99)

## 2017-06-06 LAB — CBC
HEMATOCRIT: 33.9 % — AB (ref 39.0–52.0)
HEMOGLOBIN: 11.2 g/dL — AB (ref 13.0–17.0)
MCH: 31.1 pg (ref 26.0–34.0)
MCHC: 33 g/dL (ref 30.0–36.0)
MCV: 94.2 fL (ref 78.0–100.0)
Platelets: 193 10*3/uL (ref 150–400)
RBC: 3.6 MIL/uL — AB (ref 4.22–5.81)
RDW: 14.3 % (ref 11.5–15.5)
WBC: 7.4 10*3/uL (ref 4.0–10.5)

## 2017-06-06 LAB — HEPARIN LEVEL (UNFRACTIONATED): HEPARIN UNFRACTIONATED: 0.24 [IU]/mL — AB (ref 0.30–0.70)

## 2017-06-06 LAB — MAGNESIUM: Magnesium: 1.6 mg/dL — ABNORMAL LOW (ref 1.7–2.4)

## 2017-06-06 LAB — APTT: APTT: 35 s (ref 24–36)

## 2017-06-06 LAB — PHOSPHORUS: Phosphorus: 2.6 mg/dL (ref 2.5–4.6)

## 2017-06-06 MED ORDER — HEPARIN (PORCINE) IN NACL 100-0.45 UNIT/ML-% IJ SOLN
1750.0000 [IU]/h | INTRAMUSCULAR | Status: DC
Start: 2017-06-06 — End: 2017-06-07
  Filled 2017-06-06 (×2): qty 250

## 2017-06-06 MED ORDER — HEPARIN (PORCINE) IN NACL 100-0.45 UNIT/ML-% IJ SOLN
1500.0000 [IU]/h | INTRAMUSCULAR | Status: DC
  Administered 2017-06-06: 1500 [IU]/h via INTRAVENOUS
  Filled 2017-06-06 (×2): qty 250

## 2017-06-06 MED ORDER — MAGNESIUM SULFATE 2 GM/50ML IV SOLN
2.0000 g | Freq: Once | INTRAVENOUS | Status: AC
  Administered 2017-06-06: 2 g via INTRAVENOUS
  Filled 2017-06-06: qty 50

## 2017-06-06 NOTE — Consult Note (Signed)
[place] Nurse ostomy consult note Stoma type/location: LLQ colostomy Stomal assessment/size: 2 and 1/4 inch slightly oval, red, most, functioning stoma with os at center Peristomal assessment: intact, mild erythema from 5-7 o'clock from leakage. Numerous creases in peristomal field Treatment options for stomal/peristomal skin: skin barrier ring OutputL soft brown stool  Ostomy pouching: 2pc. 2 and 3/4 inch pouching ystem with skin barrier ring Education provided: Extended session with patient and his wife pertaining to pouch removal, pouch emptying, pouch preparation and pouch application.  GI A&P, pouch characteristics and stoma characteristics taught. Patient has emptied pouch x1 with support and will need to be independent in this skill prior to discharge.  Is taught today how to empty pouch and clean tail closure with toilet paper "wicks". Supplies at bedside along with educational booklet.  Patient will have [place] post discharge for continued care and teaching. Enrolled patient in [place] program: Yes  [place] nursing team will follow while in house, and will remain available to this patient, the nursing, surgical and medical teams.   Thanks, Maudie Flakes, MSN, RN, [place], Arther Abbott  Pager# [phone]

## 2017-06-06 NOTE — Progress Notes (Signed)
Pharmacy - IV heparin  Assessment:    Please see note from Dia Sitter, PharmD earlier today for full details.  Briefly, 72 y.o. male on IV heparin for Afib while off Xarelto postoperatively. Previously on SQ heparin    First heparin level SUBtherapeutic at 0.24 units/mL on 1500 units/hr  No infusion or bleeding issues per RN  Plan:   Increase heparin infusion to 1750 units/hr  Recheck heparin level in 6 hrs  Reuel Boom, PharmD, BCPS [phone] 06/06/2017, 8:44 PM

## 2017-06-06 NOTE — Care Management Important Message (Signed)
Important Message  Patient Details  Name: [name] MRN: [id number] Date of Birth: [birth year]   Medicare Important Message Given:  Yes    Kerin Salen 06/06/2017, 12:09 [place] Message  Patient Details  Name: [name] MRN: [id number] Date of Birth: Jun 14, 1945   Medicare Important Message Given:  Yes    Kerin Salen 06/06/2017, 12:09 PM

## 2017-06-06 NOTE — Progress Notes (Signed)
Subjective No acute events. Feeling well. Denies n/v. Tolerating diet. Pain controlled. Colostomy putting out gas and stool.  Objective: Vital signs in last 24 hours: Temp:  [98 F (36.7 C)-98.4 F (36.9 C)] 98.1 F (36.7 C) (01/28 0527) Pulse Rate:  [83-103] 99 (01/28 0527) Resp:  [17-18] 18 (01/28 0527) BP: (142-158)/(85-90) 142/90 (01/28 0527) SpO2:  [96 %-98 %] 97 % (01/28 0527) Last BM Date: 06/05/17  Intake/Output from previous day: 01/27 0701 - 01/28 0700 In: 4150 [P.O.:1100; I.V.:1250] Out: 3650 [Urine:2600; Stool:1050] Intake/Output this shift: No intake/output data recorded.  Gen: NAD, comfortable CV: RRR Pulm: Normal work of breathing Abd: Soft, NT/ND; stoma pink and productive. Ext: SCDs in place  Lab Results: CBC  Recent Labs    06/05/17 0610 06/06/17 0545  WBC 7.1 7.4  HGB 10.4* 11.2*  HCT 31.8* 33.9*  PLT 164 193   BMET Recent Labs    06/05/17 0610 06/06/17 0545  NA 140 140  K 3.2* 3.6  CL 107 108  CO2 25 25  GLUCOSE 116* 144*  BUN 9 9  CREATININE 1.05 0.98  CALCIUM 8.2* 8.5*   PT/INR No results for input(s): LABPROT, INR in the last 72 hours. ABG No results for input(s): PHART, HCO3 in the last 72 hours.  Invalid input(s): PCO2, PO2  Studies/Results:  Anti-infectives: Anti-infectives (From admission, onward)   Start     Dose/Rate Route Frequency Ordered Stop   06/03/17 0602  cefoTEtan in Dextrose 5% (CEFOTAN) IVPB 2 g     2 g Intravenous On call to O.R. 06/03/17 0602 06/03/17 0804   06/03/17 0602  neomycin (MYCIFRADIN) tablet 1,000 mg  Status:  Discontinued     1,000 mg Oral 3 times per day 06/03/17 0602 06/03/17 0604   06/03/17 0602  metroNIDAZOLE (FLAGYL) tablet 1,000 mg  Status:  Discontinued     1,000 mg Oral 3 times per day 06/03/17 0602 06/03/17 0604       Assessment/Plan: Patient Active Problem List   Diagnosis Date Noted  . A-fib ([place]) 03/28/2017  . Diverticulitis 03/28/2017  . History of hearing problem  03/28/2017  . H/O cataract 03/28/2017  . Uncontrolled type 2 diabetes mellitus with circulatory disorder 04/15/2014  . Abdominal aneurysm without mention of rupture 08/03/2011  . Hyperlipidemia 07/06/2008  . HYPERTENSION, BENIGN 07/06/2008  . CAD, NATIVE VESSEL 07/06/2008   s/p Procedure(s): LAPAROSCOPIC  SIGMOID COLECTOMY, COLOSTOMY CYSTOSCOPY WITH STENT PLACEMENT 06/03/2017 - POD#3 -Diabetic diet -D/C foley catheter -Stoma RN to see pt today -CM referral for [facility] nurse to assist in wound care/stoma care -Will start heparin gtt today without boluses - if hgb stable tomorrow, will transition back onto Xarelto   LOS: 3 days   Sharon Mt. Dema Severin, M.D. General and Colorectal Surgery [facility]

## 2017-06-06 NOTE — Progress Notes (Signed)
[place] for heparin Indication: hx atrial fibrillation (PTA xarelto on hold)  No Known Allergies  Patient Measurements: Height: 6\' 1"  (185.4 cm) Weight: 220 lb 0.3 oz (99.8 kg) IBW/kg (Calculated) : 79.9 Heparin Dosing Weight: 100 kg  Vital Signs: Temp: 98.1 F (36.7 C) (01/28 0527) Temp Source: Oral (01/28 0527) BP: 142/90 (01/28 0527) Pulse Rate: 99 (01/28 0527)  Labs: Recent Labs    06/04/17 0305 06/05/17 0610 06/06/17 0545  HGB 10.3* 10.4* 11.2*  HCT 31.1* 31.8* 33.9*  PLT 184 164 193  CREATININE 1.53* 1.05 0.98    Estimated Creatinine Clearance: 86 mL/min (by C-G formula based on SCr of 0.98 mg/dL).   Medical History: Past Medical History:  Diagnosis Date  . AAA (abdominal aortic aneurysm) ([place])   . Allergy   . Atrial fibrillation [facility]) October 2013   Xarelto started; duration unknown  . Cataract   . Chronic kidney disease    Stage III   . Coronary artery disease    post bare-metal stent to LAD in 2001  . Diabetes mellitus    type 2   . Hypertension   . Myocardial infarct ([place]) 2001  . Obesity     Medications:  PTA: xarelto 20 mg daily (per pt's wife, last dose taken on 1/21)  Assessment: Patient is a 72 y.o M with hx sigmoid colon stricture/recurrent diverticulitis and afib on xarelto PTA, presented to [facility] on 06/03/17 for sigmoid colectomy and cystoscopy with stent placement.  Xarelto placed on prior to procedur with heparin SQ started post procedure for VTE prophylaxis. To resume anticoagulation back on 1/28 with heparin drip.  - last dose of heparin SQ given at 0547 on 1/28 - baseline aPTT 35 and heparin level <0.1  Goal of Therapy:  Heparin level 0.3-0.7 units/ml Monitor platelets by anticoagulation protocol: Yes   Plan:  - d/c heparin SQ - heparin drip at 1500 units/hr (no bolus per MD's request) - check 8 hr heparin level - monitor for s/s bleeding  Kysen Wetherington P 06/06/2017,8:43 AM

## 2017-06-06 NOTE — Progress Notes (Signed)
Physical Therapy Treatment Patient Details Name: [name] MRN: [id number] DOB: Apr 04, 1946 Today's Date: 06/06/2017    History of Present Illness Pt is a 72 year old male s/p LAPAROSCOPIC SIGMOID COLECTOMY, COLOSTOMY on 05/24/17    PT Comments    Patient progressing as reports ambulation 600' earlier with nursing staff.  Limited this session due to ostomy leaking and ostomy RN scheduled for teaching so positioned pt in bed for teaching with wife present.  Will follow up for progressing mobility.  Follow Up Recommendations  No PT follow up     Equipment Recommendations  Rolling walker with 5" wheels    Recommendations for Other Services       Precautions / Restrictions Precautions Precautions: Fall Precaution Comments: colostomy Restrictions Weight Bearing Restrictions: No    Mobility  Bed Mobility Overal bed mobility: Needs Assistance Bed Mobility: Supine to Sit;Sit to Sidelying     Supine to sit: Min assist;HOB elevated   Sit to sidelying: Min assist General bed mobility comments: pt pulled up on rail and PT hand to sit despite education to try from sidelying, to side with mod cues and min A for guiding legs in bed  Transfers Overall transfer level: Needs assistance Equipment used: Rolling walker (2 wheeled) Transfers: Sit to/from Stand Sit to Stand: Min assist         General transfer comment: pulls up on walker despite cues for hand placement, pt's ostomy bag leaking so returned to sitting for cleaning.  Wife states ostomy RN to come for education so assisted to supine with towel to prevent further leakage  Ambulation/Gait             General Gait Details: deferred due to ostomy leaking, reports walked 600' earlier today   Stairs            Wheelchair Mobility    Modified Rankin (Stroke Patients Only)       Balance Overall balance assessment: Needs assistance   Sitting balance-Leahy Scale: Good     Standing balance support:  Single extremity supported   Standing balance comment: can stand with one UE support (other holding towel at abdomen                            Cognition Arousal/Alertness: Awake/alert Behavior During Therapy: WFL for tasks assessed/performed Overall Cognitive Status: Within Functional Limits for tasks assessed                                        Exercises      General Comments        Pertinent Vitals/Pain Pain Assessment: Faces Faces Pain Scale: Hurts little more Pain Location: abdomen Pain Descriptors / Indicators: Sore Pain Intervention(s): Monitored during session;Repositioned;RN gave pain meds during session    Home Living                      Prior Function            PT Goals (current goals can now be found in the care plan section) Progress towards PT goals: Progressing toward goals    Frequency    Min 3X/week      PT Plan Current plan remains appropriate    Co-evaluation              AM-PAC PT "6 Clicks" Daily  Activity  Outcome Measure  Difficulty turning over in bed (including adjusting bedclothes, sheets and blankets)?: A Little Difficulty moving from lying on back to sitting on the side of the bed? : Unable Difficulty sitting down on and standing up from a chair with arms (e.g., wheelchair, bedside commode, etc,.)?: A Little Help needed moving to and from a bed to chair (including a wheelchair)?: A Little Help needed walking in hospital room?: A Little Help needed climbing 3-5 steps with a railing? : A Little 6 Click Score: 16    End of Session   Activity Tolerance: Treatment limited secondary to medical complications (Comment)(leaking ostomy) Patient left: in bed;with family/visitor present   PT Visit Diagnosis: Difficulty in walking, not elsewhere classified (R26.2)     Time: 4196-2229 PT Time Calculation (min) (ACUTE ONLY): 17 min  Charges:  $Therapeutic Activity: 8-22 mins                     G CodesMagda Bartlett, [place] [phone] 06/06/2017    [name] 06/06/2017, 3:43 PM

## 2017-06-06 NOTE — Progress Notes (Signed)
PROGRESS NOTE    [name]  ZHY:[id number] DOB: Apr 02, 1946 DOA: 06/03/2017 PCP: Cyndi Bender, PA-C   Brief Narrative: 72 year old male with a past medical history significant for hypertension, hyperlipidemia, type 2 diabetes mellitus with nephropathy and neuropathy, history of myocardial infarct (status post PCI in 2001), GERD, atrial fibrillation (on Xarelto); who came to the hospital for correction of colonic stricture/recurrent episodes of diverticulosis. S/p sigmoidectomy with colostomy on 06/03/17. Also underwent bilateral ureteral stents by urology .  Assessment & Plan:   Active Problems:   Diverticulitis   Sigmoid stricture with recurrent diverticulitis: Status post sigmoidectomy with end colostomy.  General surgery managing. Diet advanced  Atrial fibrillation: Xarelto on hold.  Heparin drip started  by surgery.  Currently rate is controlled.  Continue PO Lopressor now.Monitor on telemetry.  Coronary artery disease status post PCI: Currently stable.  No chest pain.  Continue beta-blockers and statins  Diabetes mellitus 2: History of diabetic nephropathy neuropathy.  Continue sliding scale insulin and Levemir.  Continue monitoring blood sugars. Continue Neurontin for diabetic neuropathy  GERD: Continue PPI  Hyperlipidemia: Continue statin  Hypertension: Currently blood pressure stable.  Continue to monitor the blood pressure  Acute on CKD stage EXB:MWUX improved. Baseline creatinine ranges from 1.2-1.3.  Continue gentle IV fluids.  Hold ACE  Hypokalemia/hypophosphatemia/hypomagnesemia:Mg Supplemented today.  Check level tomorrow.K and Phos level improved.   DVT prophylaxis: SCD Code Status: Full Family Communication: Family member present at the bed side Disposition Plan: As per surgery   Procedures: Sigmoidectomy, ureteral stent placement., Colostomy  Antimicrobials:None   Subjective:  Patient seen and examined the bedside this morning.  Remains  comfortable.  Tolerating diet.  Having output in the colostomy bag.  objective: Vitals:   06/05/17 0522 06/05/17 1350 06/05/17 2106 06/06/17 0527  BP: (!) 137/94 (!) 158/88 (!) 148/85 (!) 142/90  Pulse: 97 (!) 103 83 99  Resp: 18 18 17 18   Temp: 97.6 F (36.4 C) 98.4 F (36.9 C) 98 F (36.7 C) 98.1 F (36.7 C)  TempSrc: Oral Oral Oral Oral  SpO2: 93% 98% 96% 97%  Weight:      Height:        Intake/Output Summary (Last 24 hours) at 06/06/2017 1224 Last data filed at 06/06/2017 0600 Gross per 24 hour  Intake 3590 ml  Output 2100 ml  Net 1490 ml   Filed Weights   06/03/17 0624 06/03/17 1500  Weight: 103 kg (227 lb) 99.8 kg (220 lb 0.3 oz)    Examination:  General exam: Appears calm and comfortable ,Not in distress,average built Respiratory system: Bilateral equal air entry, normal vesicular breath sounds, no wheezes or crackles  Cardiovascular system: S1 & S2 heard, RRR. No JVD, murmurs, rubs, gallops or clicks.  Gastrointestinal system: Abdomen is nondistended, soft and nontender. No organomegaly or masses felt. Normal bowel sounds heard. Colostomy, transverse surgical incision covered with dressing Central nervous system: Alert and oriented. No focal neurological deficits. Extremities: No edema, no clubbing ,no cyanosis, distal peripheral pulses palpable. Skin: No cyanosis,No pallor,No Rash,No Ulcer Psychiatry: Judgement and insight appear normal. Mood & affect appropriate.  Foley  Data Reviewed: I have personally reviewed following labs and imaging studies  CBC: Recent Labs  Lab 05/31/17 1500 06/03/17 1435 06/04/17 0305 06/05/17 0610 06/06/17 0545  WBC 6.5 14.8* 9.7 7.1 7.4  NEUTROABS 3.5  --   --   --   --   HGB 11.5* 11.2* 10.3* 10.4* 11.2*  HCT 35.8* 34.1* 31.1* 31.8* 33.9*  MCV 95.2  95.3 94.2 94.9 94.2  PLT 196 177 184 164 094   Basic Metabolic Panel: Recent Labs  Lab 05/31/17 1500 06/03/17 1435 06/04/17 0305 06/05/17 0610 06/06/17 0545  NA 140   --  140 140 140  K 3.3*  --  3.5 3.2* 3.6  CL 106  --  107 107 108  CO2 26  --  25 25 25   GLUCOSE 131*  --  158* 116* 144*  BUN 14  --  18 9 9   CREATININE 1.26* 1.58* 1.53* 1.05 0.98  CALCIUM 8.8*  --  8.4* 8.2* 8.5*  MG  --   --  1.4* 1.7 1.6*  PHOS  --   --  3.9 2.1* 2.6   GFR: Estimated Creatinine Clearance: 86 mL/min (by C-G formula based on SCr of 0.98 mg/dL). Liver Function Tests: Recent Labs  Lab 05/31/17 1500  AST 19  ALT 13*  ALKPHOS 55  BILITOT 0.5  PROT 6.9  ALBUMIN 3.6   No results for input(s): LIPASE, AMYLASE in the last 168 hours. No results for input(s): AMMONIA in the last 168 hours. Coagulation Profile: Recent Labs  Lab 05/31/17 1500  INR 1.05   Cardiac Enzymes: No results for input(s): CKTOTAL, CKMB, CKMBINDEX, TROPONINI in the last 168 hours. BNP (last 3 results) No results for input(s): PROBNP in the last 8760 hours. HbA1C: No results for input(s): HGBA1C in the last 72 hours. CBG: Recent Labs  Lab 06/05/17 2103 06/06/17 0041 06/06/17 0524 06/06/17 0731 06/06/17 1147  GLUCAP 172* 155* 177* 122* 176*   Lipid Profile: No results for input(s): CHOL, HDL, LDLCALC, TRIG, CHOLHDL, LDLDIRECT in the last 72 hours. Thyroid Function Tests: No results for input(s): TSH, T4TOTAL, FREET4, T3FREE, THYROIDAB in the last 72 hours. Anemia Panel: No results for input(s): VITAMINB12, FOLATE, FERRITIN, TIBC, IRON, RETICCTPCT in the last 72 hours. Sepsis Labs: No results for input(s): PROCALCITON, LATICACIDVEN in the last 168 hours.  Recent Results (from the past 240 hour(s))  MRSA PCR Screening     Status: None   Collection Time: 06/03/17  3:06 PM  Result Value Ref Range Status   MRSA by PCR NEGATIVE NEGATIVE Final    Comment:        The GeneXpert MRSA Assay (FDA approved for NASAL specimens only), is one component of a comprehensive MRSA colonization surveillance program. It is not intended to diagnose MRSA infection nor to guide or monitor  treatment for MRSA infections.          Radiology Studies: No results found.      Scheduled Meds: . acetaminophen  650 mg Oral Q6H  . atorvastatin  20 mg Oral QHS  . insulin aspart  0-9 Units Subcutaneous Q6H  . insulin detemir  5 Units Subcutaneous QHS  . mouth rinse  15 mL Mouth Rinse BID  . metoprolol tartrate  25 mg Oral BID  . pantoprazole  40 mg Oral Daily  . potassium phosphate (monobasic)  500 mg Oral TID WC & HS   Continuous Infusions: . sodium chloride 50 mL/hr at 06/05/17 2134  . heparin Stopped (06/06/17 1143)     LOS: 3 days    Time spent: 25  mins    Salah Burlison Jodie Echevaria, MD [facility] Pager [phone]  If 7PM-7AM, please contact night-coverage [email or web address] Password [facility] 06/06/2017, 12:24 PM

## 2017-06-06 NOTE — Progress Notes (Signed)
Pharmacy Brief Note - Alvimopan (Entereg)  The standing order set for alvimopan (Entereg) now includes an automatic order to discontinue the drug after the patient has had a bowel movement. The change was approved by the [place] and the Medical Executive Committee.   This patient has had bowel movements documented by nursing. Therefore, alvimopan has been discontinued. If there are questions, please contact the pharmacy at [phone].   Thank you- Dorrene German 06/06/2017 6:21 AM

## 2017-06-06 NOTE — Progress Notes (Signed)
Occupational Therapy Treatment and Discharge Patient Details Name: [name] MRN: [id number] DOB: 1945-09-18 Today's Date: 06/06/2017    History of present illness Pt is a 72 year old male s/p LAPAROSCOPIC SIGMOID COLECTOMY, COLOSTOMY on 05/24/17   OT comments  This 72 yo male admitted and underwent above presents to acute OT with all goals met at a S level and has wife at  home with him prn if he does need help. No further OT needs, we will D/C.  Follow Up Recommendations  No OT follow up    Equipment Recommendations  None recommended by OT       Precautions / Restrictions Precautions Precautions: Fall Precaution Comments: colostomy Restrictions Weight Bearing Restrictions: No       Mobility Bed Mobility               General bed mobility comments: pt up in recliner on arrival  Transfers Overall transfer level: Needs assistance Equipment used: Rolling walker (2 wheeled) Transfers: Sit to/from Stand Sit to Stand: Supervision                  ADL either performed or assessed with clinical judgement   ADL Overall ADL's : Needs assistance/impaired                     Lower Body Dressing: Supervision/safety;Set up;Sit to/from stand Lower Body Dressing Details (indicate cue type and reason): crossing one leg over the other while seated in recliner Toilet Transfer: Min guard;Ambulation;Comfort height toilet;Grab bars     Toileting - Clothing Manipulation Details (indicate cue type and reason): S sit to stand for clothing. Still needs education from nursing about colostomy care and cleaning. We did discuss with him sitting on toilet  to empty bag into toliet between his legs and having wet wipe and dry paper towels close by to clean up post emptying             Vision Patient Visual Report: No change from baseline            Cognition Arousal/Alertness: Awake/alert Behavior During Therapy: WFL for tasks assessed/performed Overall  Cognitive Status: Within Functional Limits for tasks assessed                                                     Pertinent Vitals/ Pain       Pain Assessment: No/denies pain         Frequency  Min 2X/week        Progress Toward Goals  OT Goals(current goals can now be found in the care plan section)  Progress towards OT goals: Goals met/education completed, patient discharged from [place] recommendations need to be updated       AM-PAC PT "6 Clicks" Daily Activity     Outcome Measure   Help from another person eating meals?: None Help from another person taking care of personal grooming?: A Little Help from another person toileting, which includes using toliet, bedpan, or urinal?: A Little Help from another person bathing (including washing, rinsing, drying)?: A Little Help from another person to put on and taking off regular upper body clothing?: A Little Help from another person to put on and taking off regular lower body clothing?: A Little 6 Click Score: 19  End of Session Equipment Utilized During Treatment: Rolling walker  OT Visit Diagnosis: Unsteadiness on feet (R26.81);Muscle weakness (generalized) (M62.81)   Activity Tolerance Patient tolerated treatment well   Patient Left in chair;with call bell/phone within reach;with family/visitor present   Nurse Communication (IV leaking and looks like it is pulled out)        Time: 1113-1130 OT Time Calculation (min): 17 min  Charges: OT General Charges $OT Visit: 1 Visit OT Treatments $Self Care/Home Management : 8-22 mins  [place], OTR/L [phone] 06/06/2017

## 2017-06-07 ENCOUNTER — Other Ambulatory Visit: Payer: Self-pay | Admitting: *Deleted

## 2017-06-07 LAB — CBC WITH DIFFERENTIAL/PLATELET
Basophils Absolute: 0 10*3/uL (ref 0.0–0.1)
Basophils Relative: 0 %
Eosinophils Absolute: 0.6 10*3/uL (ref 0.0–0.7)
Eosinophils Relative: 9 %
HCT: 35.2 % — ABNORMAL LOW (ref 39.0–52.0)
Hemoglobin: 11.9 g/dL — ABNORMAL LOW (ref 13.0–17.0)
LYMPHS ABS: 2 10*3/uL (ref 0.7–4.0)
LYMPHS PCT: 26 %
MCH: 31.5 pg (ref 26.0–34.0)
MCHC: 33.8 g/dL (ref 30.0–36.0)
MCV: 93.1 fL (ref 78.0–100.0)
MONO ABS: 0.5 10*3/uL (ref 0.1–1.0)
MONOS PCT: 7 %
Neutro Abs: 4.4 10*3/uL (ref 1.7–7.7)
Neutrophils Relative %: 58 %
Platelets: 203 10*3/uL (ref 150–400)
RBC: 3.78 MIL/uL — ABNORMAL LOW (ref 4.22–5.81)
RDW: 14 % (ref 11.5–15.5)
WBC: 7.5 10*3/uL (ref 4.0–10.5)

## 2017-06-07 LAB — BASIC METABOLIC PANEL
ANION GAP: 7 (ref 5–15)
BUN: 13 mg/dL (ref 6–20)
CO2: 24 mmol/L (ref 22–32)
Calcium: 8.6 mg/dL — ABNORMAL LOW (ref 8.9–10.3)
Chloride: 108 mmol/L (ref 101–111)
Creatinine, Ser: 1.11 mg/dL (ref 0.61–1.24)
GFR calc non Af Amer: >60 mL/min (ref >60)
Glucose, Bld: 131 mg/dL — ABNORMAL HIGH (ref 65–99)
POTASSIUM: 3.6 mmol/L (ref 3.5–5.1)
SODIUM: 139 mmol/L (ref 135–145)

## 2017-06-07 LAB — CBC
HEMATOCRIT: 32.7 % — AB (ref 39.0–52.0)
Hemoglobin: 11.1 g/dL — ABNORMAL LOW (ref 13.0–17.0)
MCH: 31.6 pg (ref 26.0–34.0)
MCHC: 33.9 g/dL (ref 30.0–36.0)
MCV: 93.2 fL (ref 78.0–100.0)
Platelets: 188 10*3/uL (ref 150–400)
RBC: 3.51 MIL/uL — AB (ref 4.22–5.81)
RDW: 14.2 % (ref 11.5–15.5)
WBC: 6.8 10*3/uL (ref 4.0–10.5)

## 2017-06-07 LAB — GLUCOSE, CAPILLARY
Glucose-Capillary: 129 mg/dL — ABNORMAL HIGH (ref 65–99)
Glucose-Capillary: 142 mg/dL — ABNORMAL HIGH (ref 65–99)
Glucose-Capillary: 171 mg/dL — ABNORMAL HIGH (ref 65–99)
Glucose-Capillary: 169 mg/dL — ABNORMAL HIGH (ref 65–99)

## 2017-06-07 LAB — HEPARIN LEVEL (UNFRACTIONATED)
HEPARIN UNFRACTIONATED: 0.45 [IU]/mL (ref 0.30–0.70)
Heparin Unfractionated: 0.49 IU/mL (ref 0.30–0.70)

## 2017-06-07 LAB — MAGNESIUM: Magnesium: 1.9 mg/dL (ref 1.7–2.4)

## 2017-06-07 LAB — PHOSPHORUS: Phosphorus: 3.9 mg/dL (ref 2.5–4.6)

## 2017-06-07 MED ORDER — TRAMADOL HCL 50 MG PO TABS: 50.0000 mg | ORAL_TABLET | Freq: Four times a day (QID) | ORAL | 0 refills | Status: DC | PRN

## 2017-06-07 NOTE — Progress Notes (Addendum)
Subjective No acute events. Feeling well. Denies n/v. Tolerating diet. Pain controlled with tylenol. Colostomy putting out gas and stool.  Objective: Vital signs in last 24 hours: Temp:  [97.4 F (36.3 C)-98.1 F (36.7 C)] 98 F (36.7 C) (01/29 0546) Pulse Rate:  [91-101] 99 (01/29 0546) Resp:  [18-21] 18 (01/29 0546) BP: (128-149)/(90-101) 149/101 (01/29 0546) SpO2:  [96 %-97 %] 97 % (01/29 0546) Last BM Date: 06/06/17  Intake/Output from previous day: 01/28 0701 - 01/29 0700 In: 1024.4 [I.V.:1024.4] Out: -  Intake/Output this shift: Total I/O In: 1024.4 [I.V.:1024.4] Out: -   Gen: NAD, comfortable CV: RRR Pulm: Normal work of breathing Abd: Soft, NT/ND; stoma pink and productive. Incisions c/d/i without surrounding erythema or drainage. Ext: SCDs in place  Lab Results: CBC  Recent Labs    06/06/17 0545 06/07/17 0307  WBC 7.4 6.8  HGB 11.2* 11.1*  HCT 33.9* 32.7*  PLT 193 188   BMET Recent Labs    06/06/17 0545 06/07/17 0307  NA 140 139  K 3.6 3.6  CL 108 108  CO2 25 24  GLUCOSE 144* 131*  BUN 9 13  CREATININE 0.98 1.11  CALCIUM 8.5* 8.6*     Assessment/Plan: Patient Active Problem List   Diagnosis Date Noted  . A-fib ([place]) 03/28/2017  . Diverticulitis 03/28/2017  . History of hearing problem 03/28/2017  . H/O cataract 03/28/2017  . Uncontrolled type 2 diabetes mellitus with circulatory disorder 04/15/2014  . Abdominal aneurysm without mention of rupture 08/03/2011  . Hyperlipidemia 07/06/2008  . HYPERTENSION, BENIGN 07/06/2008  . CAD, NATIVE VESSEL 07/06/2008   s/p Procedure(s): LAPAROSCOPIC  SIGMOID COLECTOMY, COLOSTOMY CYSTOSCOPY WITH STENT PLACEMENT 06/03/2017 - POD#4  -Diabetic diet -Medicine assisting in mgmt of comorbidities - appreciate their help. -CM referral for [facility] nurse to assist in wound care/stoma care -Repeat CBC at noon - if hgb stable, will plan to D/C heparin gtt and restart Xarelto tonight -Dispo: Possible discharge this  afternoon if hgb stable, Home Health arranged and stoma RN here clears him for discharge. If this is all the case, would plan for him to restart his Xarelto at home.   LOS: 4 days   Sharon Mt. Dema Severin, M.D. General and Colorectal Surgery [facility]

## 2017-06-07 NOTE — Progress Notes (Signed)
PROGRESS NOTE    [name]  ZGY:[id number] DOB: 1946-02-05 DOA: 06/03/2017 PCP: Richard Bender, PA-C   Brief Narrative: 72 year old male with a past medical history significant for hypertension, hyperlipidemia, type 2 diabetes mellitus with nephropathy and neuropathy, history of myocardial infarct (status post PCI in 2001), GERD, atrial fibrillation (on Xarelto); who came to the hospital for correction of colonic stricture/recurrent episodes of diverticulosis. S/p sigmoidectomy with colostomy on 06/03/17. Also underwent bilateral ureteral stents by urology .  Assessment & Plan:   Active Problems:   Uncontrolled type 2 diabetes mellitus with circulatory disorder   A-fib (HCC)   Diverticulitis   Sigmoid stricture with recurrent diverticulitis: Status post sigmoidectomy with end colostomy.  General surgery managing. Diet advanced and he is tolerating.  Atrial fibrillation: Xarelto on hold.  Heparin drip started  by surgery.  Currently rate is controlled.  Continue PO Lopressor .  Plan is to resume Xarelto on discharge.  Coronary artery disease status post PCI: Currently stable.  No chest pain.  Continue beta-blockers and statins  Diabetes mellitus 2: History of diabetic nephropathy neuropathy.  Continue sliding scale insulin and Levemir.  Continue monitoring blood sugars. Continue Neurontin for diabetic neuropathy  GERD: Continue PPI  Hyperlipidemia: Continue statin  Hypertension: Currently blood pressure stable.  Continue to monitor the blood pressure  Acute on CKD stage RFF:MBWG improved. Baseline creatinine ranges from 1.2-1.3.  Continue gentle IV fluids.  Hold ACE  Hypokalemia/hypophosphatemia/hypomagnesemia: Supplemented and corrected  Thank you for giving the opportunity to participate in the care of this patient. Medicine will sign off for now.  Call again if needed.  DVT prophylaxis: SCD Code Status: Full Family Communication: Family member present at the bed  side Disposition Plan: As per surgery.Likely home today   Procedures: Sigmoidectomy, ureteral stent placement., Colostomy  Antimicrobials:None   Subjective:  Patient seen and examined the bedside this morning.  Remains comfortable.  Tolerating diet.  Having output in the colostomy bag.  objective: Vitals:   06/06/17 1357 06/06/17 2128 06/07/17 0546 06/07/17 1358  BP: (!) 133/92 128/90 (!) 149/101 120/89  Pulse: (!) 101 91 99 94  Resp: 20 (!) 21 18 18   Temp: 98.1 F (36.7 C) (!) 97.4 F (36.3 C) 98 F (36.7 C) 99.8 F (37.7 C)  TempSrc: Oral Oral Oral Oral  SpO2: 96% 97% 97% 97%  Weight:      Height:        Intake/Output Summary (Last 24 hours) at 06/07/2017 1419 Last data filed at 06/07/2017 0830 Gross per 24 hour  Intake 1721.88 ml  Output -  Net 1721.88 ml   Filed Weights   06/03/17 0624 06/03/17 1500  Weight: 103 kg (227 lb) 99.8 kg (220 lb 0.3 oz)    Examination:  General exam: Appears calm and comfortable ,Not in distress,average built Respiratory system: Bilateral equal air entry, normal vesicular breath sounds, no wheezes or crackles  Cardiovascular system: S1 & S2 heard, RRR. No JVD, murmurs, rubs, gallops or clicks.  Gastrointestinal system: Abdomen is nondistended, soft and nontender. No organomegaly or masses felt. Normal bowel sounds heard. Colostomy, transverse surgical incision covered with dressing Central nervous system: Alert and oriented. No focal neurological deficits. Extremities: No edema, no clubbing ,no cyanosis, distal peripheral pulses palpable. Skin: No cyanosis,No pallor,No Rash,No Ulcer Psychiatry: Judgement and insight appear normal. Mood & affect appropriate.  Foley  Data Reviewed: I have personally reviewed following labs and imaging studies  CBC: Recent Labs  Lab 05/31/17 1500  06/04/17 0305 06/05/17  5366 06/06/17 0545 06/07/17 0307 06/07/17 1230  WBC 6.5   < > 9.7 7.1 7.4 6.8 7.5  NEUTROABS 3.5  --   --   --   --   --   4.4  HGB 11.5*   < > 10.3* 10.4* 11.2* 11.1* 11.9*  HCT 35.8*   < > 31.1* 31.8* 33.9* 32.7* 35.2*  MCV 95.2   < > 94.2 94.9 94.2 93.2 93.1  PLT 196   < > 184 164 193 188 203   < > = values in this interval not displayed.   Basic Metabolic Panel: Recent Labs  Lab 05/31/17 1500 06/03/17 1435 06/04/17 0305 06/05/17 0610 06/06/17 0545 06/07/17 0307  NA 140  --  140 140 140 139  K 3.3*  --  3.5 3.2* 3.6 3.6  CL 106  --  107 107 108 108  CO2 26  --  25 25 25 24   GLUCOSE 131*  --  158* 116* 144* 131*  BUN 14  --  18 9 9 13   CREATININE 1.26* 1.58* 1.53* 1.05 0.98 1.11  CALCIUM 8.8*  --  8.4* 8.2* 8.5* 8.6*  MG  --   --  1.4* 1.7 1.6* 1.9  PHOS  --   --  3.9 2.1* 2.6 3.9   GFR: Estimated Creatinine Clearance: 75.9 mL/min (by C-G formula based on SCr of 1.11 mg/dL). Liver Function Tests: Recent Labs  Lab 05/31/17 1500  AST 19  ALT 13*  ALKPHOS 55  BILITOT 0.5  PROT 6.9  ALBUMIN 3.6   No results for input(s): LIPASE, AMYLASE in the last 168 hours. No results for input(s): AMMONIA in the last 168 hours. Coagulation Profile: Recent Labs  Lab 05/31/17 1500  INR 1.05   Cardiac Enzymes: No results for input(s): CKTOTAL, CKMB, CKMBINDEX, TROPONINI in the last 168 hours. BNP (last 3 results) No results for input(s): PROBNP in the last 8760 hours. HbA1C: No results for input(s): HGBA1C in the last 72 hours. CBG: Recent Labs  Lab 06/06/17 1645 06/07/17 0004 06/07/17 0545 06/07/17 0738 06/07/17 1131  GLUCAP 183* 179* 129* 142* 171*   Lipid Profile: No results for input(s): CHOL, HDL, LDLCALC, TRIG, CHOLHDL, LDLDIRECT in the last 72 hours. Thyroid Function Tests: No results for input(s): TSH, T4TOTAL, FREET4, T3FREE, THYROIDAB in the last 72 hours. Anemia Panel: No results for input(s): VITAMINB12, FOLATE, FERRITIN, TIBC, IRON, RETICCTPCT in the last 72 hours. Sepsis Labs: No results for input(s): PROCALCITON, LATICACIDVEN in the last 168 hours.  Recent Results  (from the past 240 hour(s))  MRSA PCR Screening     Status: None   Collection Time: 06/03/17  3:06 PM  Result Value Ref Range Status   MRSA by PCR NEGATIVE NEGATIVE Final    Comment:        The GeneXpert MRSA Assay (FDA approved for NASAL specimens only), is one component of a comprehensive MRSA colonization surveillance program. It is not intended to diagnose MRSA infection nor to guide or monitor treatment for MRSA infections.          Radiology Studies: No results found.      Scheduled Meds: . acetaminophen  650 mg Oral Q6H  . atorvastatin  20 mg Oral QHS  . insulin aspart  0-9 Units Subcutaneous Q6H  . insulin detemir  5 Units Subcutaneous QHS  . mouth rinse  15 mL Mouth Rinse BID  . metoprolol tartrate  25 mg Oral BID  . pantoprazole  40 mg Oral Daily  .  potassium phosphate (monobasic)  500 mg Oral TID WC & HS   Continuous Infusions: . sodium chloride 50 mL/hr at 06/05/17 2134  . heparin 1,750 Units/hr (06/06/17 2045)     LOS: 4 days    Time spent: 25  mins    Richard Bisher Jodie Echevaria, MD [facility] Pager [phone]  If 7PM-7AM, please contact night-coverage [email or web address] Password [email or web address] 06/07/2017, 2:19 PM

## 2017-06-07 NOTE — Progress Notes (Signed)
ANTICOAGULATION CONSULT NOTE   Pharmacy Consult for heparin Indication: hx atrial fibrillation (PTA xarelto on hold)  No Known Allergies  Patient Measurements: Height: 6\' 1"  (185.4 cm) Weight: 220 lb 0.3 oz (99.8 kg) IBW/kg (Calculated) : 79.9 Heparin Dosing Weight: 100 kg  Vital Signs: Temp: 98 F (36.7 C) (01/29 0546) Temp Source: Oral (01/29 0546) BP: 149/101 (01/29 0546) Pulse Rate: 99 (01/29 0546)  Labs: Recent Labs    06/05/17 0610 06/06/17 0545  06/06/17 0901 06/06/17 1801 06/07/17 0307 06/07/17 1230  HGB 10.4* 11.2*  --   --   --  11.1* 11.9*  HCT 31.8* 33.9*  --   --   --  32.7* 35.2*  PLT 164 193  --   --   --  188 203  APTT  --   --   --  35  --   --   --   HEPARINUNFRC  --   --    < > <0.10* 0.24* 0.49 0.45  CREATININE 1.05 0.98  --   --   --  1.11  --    < > = values in this interval not displayed.    Estimated Creatinine Clearance: 75.9 mL/min (by C-G formula based on SCr of 1.11 mg/dL).   Medical History: Past Medical History:  Diagnosis Date  . AAA (abdominal aortic aneurysm) ([place])   . Allergy   . Atrial fibrillation [facility]) October 2013   Xarelto started; duration unknown  . Cataract   . Chronic kidney disease    Stage III   . Coronary artery disease    post bare-metal stent to LAD in 2001  . Diabetes mellitus    type 2   . Hypertension   . Myocardial infarct ([place]) 2001  . Obesity     Medications:  PTA: xarelto 20 mg daily (per pt's wife, last dose taken on 1/21)  Assessment: Patient is a 72 y.o M with hx sigmoid colon stricture/recurrent diverticulitis and afib on xarelto PTA, presented to [facility] on 06/03/17 for sigmoid colectomy and cystoscopy with stent placement.  Xarelto placed on prior to procedur with heparin SQ started post procedure for VTE prophylaxis. To resume anticoagulation back on 1/28 with heparin drip.  - last dose of heparin SQ given at 0547 on 1/28 - baseline aPTT 35 and heparin level <0.1  Today,  06/07/2017  Confirmatory heparin level therapeutic today on heparin 1750 units/hr  CBC: Hgb slightly improved, pltc WNL  No bleeding noted  Goal of Therapy:  Heparin level 0.3-0.7 units/ml Monitor platelets by anticoagulation protocol: Yes   Plan:   Continue heparin gtt at current rate of 1750 units/hr   Per surgery notes, likely convert back to home xarelto 20mg  daily  Also possible discharge today based on CBC results and no other needs  Check daily heparin level and CBC until plans more concrete   Doreene Eland, PharmD, BCPS.   Pager: [phone] 06/07/2017 1:30 PM

## 2017-06-07 NOTE — Consult Note (Signed)
   [facility] CM Inpatient Consult   06/07/2017  [name] [birth year] [id number]   Made aware of hospitalization by [facility] RN [facility].   Went to bedside to speak with [name] and wife about ongoing [place] Management services.   [name] is agreeable to monthly telephone calls. States, "much more than that will be too much". Discussed that [place] Management program is voluntary as well and does not interfere or replace any services provided by home health. Prefers to stay with [facility] rather than being referred to Telephonic RNCM at this time.  [facility] Care Management written consent obtained for ongoing [place] Management services.   [facility] Care Management folder provided along with 24-hr nurse advice line.   Spoke with inpatient RNCM to make aware of above notes.   Notification sent to [place] coach to make aware that both patient and wife wish to remain with Health Coach follow up.    Marthenia Rolling, MSN-Ed, RN,BSN [facility] Liaison [phone]

## 2017-06-07 NOTE — Progress Notes (Signed)
Discharge instructions and medications discussed with patient.  Prescription and AVS given to wife.  Patient instructed to take xeralto as soon as he gets home; per pharmacy.  Walker delivered to bedside. Per Suanne Marker Case Manager patient's home health is set up and is ready to discharge home.  All questions answered.

## 2017-06-07 NOTE — Progress Notes (Signed)
ANTICOAGULATION CONSULT NOTE - Follow Up Consult  Pharmacy Consult for Heparin Indication: hx atrial fibrillation (PTA xarelto on hold)  No Known Allergies  Patient Measurements: Height: 6\' 1"  (185.4 cm) Weight: 220 lb 0.3 oz (99.8 kg) IBW/kg (Calculated) : 79.9 Heparin Dosing Weight:   Vital Signs: Temp: 97.4 F (36.3 C) (01/28 2128) Temp Source: Oral (01/28 2128) BP: 128/90 (01/28 2128) Pulse Rate: 91 (01/28 2128)  Labs: Recent Labs    06/05/17 0610 06/06/17 0545 06/06/17 0901 06/06/17 1801 06/07/17 0307  HGB 10.4* 11.2*  --   --  11.1*  HCT 31.8* 33.9*  --   --  32.7*  PLT 164 193  --   --  188  APTT  --   --  35  --   --   HEPARINUNFRC  --   --  <0.10* 0.24* 0.49  CREATININE 1.05 0.98  --   --  1.11    Estimated Creatinine Clearance: 75.9 mL/min (by C-G formula based on SCr of 1.11 mg/dL).   Medications:  Infusions:  . sodium chloride 50 mL/hr at 06/05/17 2134  . heparin 1,750 Units/hr (06/06/17 2045)    Assessment: Patient with heparin level at goal.  No heparin issues noted.    Goal of Therapy:  Heparin level 0.3-0.7 units/ml Monitor platelets by anticoagulation protocol: Yes   Plan:  Continue heparin drip at current rate Recheck level at [address] [place] 06/07/2017,4:42 AM

## 2017-06-07 NOTE — Discharge Summary (Signed)
Patient ID: [name] MRN: [id number] DOB/AGE: [birth year] 72 y.o.  Admit date: 06/03/2017 Discharge date: 06/07/2017  Discharge Diagnoses Patient Active Problem List   Diagnosis Date Noted  . A-fib ([place]) 03/28/2017  . Diverticulitis 03/28/2017  . History of hearing problem 03/28/2017  . H/O cataract 03/28/2017  . Uncontrolled type 2 diabetes mellitus with circulatory disorder 04/15/2014  . Abdominal aneurysm without mention of rupture 08/03/2011  . Hyperlipidemia 07/06/2008  . HYPERTENSION, BENIGN 07/06/2008  . CAD, NATIVE VESSEL 07/06/2008    Consultants Internal medicine - assistance in managing comorbidities  Procedures Laparoscopic sigmoidectomy with end colostomy (colorectal surgery) Cystoscopy/stents (urology)  Hospital Course: Patient was taken to the OR 06/03/17 and underwent the above procedure. He was admitted to the hospital postoperatively. Internal medicine was consulted to assist in managing his medical comorbidities. His diet was advanced and on POD#2, he began having stoma output. On POD#3, his hemoglobin levels were stable so a heparin drip was initiated for his history of atrial fibrillation. His hemoglobin remained stable while therapeutic on heparin on POD#4. Home health was arranged and in place on POD#4. He was tolerating a diet and pain was controlled on oral analgesics. He had completed stoma teaching and demonstrated ability to care with some assistance as of POD#4. He was therefore deemed stable for discharge home with home health and plans to resume Xarelto on the evening of discharge (06/07/17)  His pathology additionally returned benign with changes consistent with diverticulitis  Allergies as of 06/07/2017   No Known Allergies     Medication List    TAKE these medications   atorvastatin 20 MG tablet Commonly known as:  LIPITOR Take 20 mg by mouth at bedtime.   Dulaglutide 1.5 MG/0.5ML Sopn Commonly known as:  TRULICITY Please inject under skin  1.5 mg weekly What changed:    how much to take  how to take this  when to take this  additional instructions   ferrous sulfate 325 (65 FE) MG tablet Take 325 mg by mouth 3 (three) times a week.   glipiZIDE-metformin 2.5-500 MG tablet Commonly known as:  METAGLIP TAKE TWO TABLETS BY MOUTH  TWICE DAILY BEFORE MEALS What changed:  See the new instructions.   Insulin Isophane & Regular Human (70-30) 100 UNIT/ML PEN Commonly known as:  HUMULIN 70/30 MIX Inject under skin 20 units before b'fast and 20 units before dinner What changed:    how much to take  how to take this  when to take this  additional instructions   Insulin Pen Needle 32G X 4 MM Misc Commonly known as:  RELION PEN NEEDLES Use 2x a day as advised   metoprolol tartrate 25 MG tablet Commonly known as:  LOPRESSOR TAKE 1 TABLET BY MOUTH TWO  TIMES DAILY What changed:    how much to take  how to take this  when to take this   nitroGLYCERIN 0.4 MG SL tablet Commonly known as:  NITROSTAT Place 0.4 mg under the tongue every 5 (five) minutes as needed.   omeprazole 20 MG capsule Commonly known as:  PRILOSEC Take 20 mg by mouth daily as needed (for acid reflux/indigestion). Reported on 11/13/2015   PROBIOTIC-10 PO Take 1 capsule by mouth every other day.   ramipril 10 MG capsule Commonly known as:  ALTACE Take 10 mg by mouth daily.   traMADol 50 MG tablet Commonly known as:  ULTRAM Take 1 tablet (50 mg total) by mouth every 6 (six) hours as needed for up  to 7 days (pain not controlled with tylenol).   XARELTO 20 MG Tabs tablet Generic drug:  rivaroxaban TAKE 1 TABLET BY MOUTH  DAILY What changed:    how much to take  how to take this  when to take this            Durable Medical Equipment  (From admission, onward)        Start     Ordered   06/07/17 1551  For home use only DME 4 wheeled rolling walker with seat  Once    Question:  Patient needs a walker to treat with the  following condition  Answer:  Weakness   06/07/17 1551       Follow-up Information    Ileana Roup, MD Follow up in 2 week(s).   Specialty:  General Surgery Contact information: [place] [address] [phone]           Gurpreet Mikhail M. Dema Severin, M.D. [facility]

## 2017-06-07 NOTE — Patient Outreach (Signed)
[facility]) Care Management  06/07/2017  [name] [birth year] [id number]   [facility] Admission  Referral Date: 09/29/16 Referral Source: self referral Reason for Referral: Not able to afford prescriptions due to cost Insurance: [employer]  Outreach Attempt:  Received notification patient hospitalized at [place] since 1/25/219 for planned surgery.  Patient underwent laparoscopic sigmoid colectomy with end colostomy placement and cystoscopy with stent placement on 06/03/2017.  Patient doing well post op per hospital notes.  Ambulating with therapies.  [facility] Liaison to check on patient and offer [facility] Telephonic Nurse services.  Received message back stating patient would like to continue with RN Health Coach monthly telephone outreaches.  Plan is for patient to discharge home with wife and home health assisting with ostomy education/care.  Plan:  RN Health Coach will make telephone outreach to patient within 7 days post hospital discharge.  [place] [phone] [email or web address]

## 2017-06-07 NOTE — Discharge Instructions (Signed)
POST OP INSTRUCTIONS  1. DIET: As tolerated. Follow a light bland diet the first 24 hours after arrival home, such as soup, liquids, crackers, etc.  Be sure to include lots of fluids daily.  Avoid fast food or heavy meals as your are more likely to get nauseated.  Eat a low fat the next few days after surgery.  2. Take your usually prescribed home medications unless otherwise directed.  3. PAIN CONTROL: a. Pain is best controlled by a usual combination of three different methods TOGETHER: i. Ice/Heat ii. Over the counter pain medication iii. Prescription pain medication b. Most patients will experience some swelling and bruising around the surgical site.  Ice packs or heating pads (30-60 minutes up to 6 times a day) will help. Some people prefer to use ice alone, heat alone, alternating between ice & heat.  Experiment to what works for you.  Swelling and bruising can take several weeks to resolve.   c. It is helpful to take an over-the-counter pain medication regularly for the first few weeks: i. Ibuprofen (Motrin/Advil) - 200mg  tabs - take 3 tabs (600mg ) every 6 hours as needed for pain ii. Acetaminophen (Tylenol) - you may take 650mg  every 6 hours as needed. You can take this with motrin as they act differently on the body. If you are taking a narcotic pain medication that has acetaminophen in it, do not take over the counter tylenol at the same time.  Iii. NOTE: You may take both of these medications together - most patients  find it most helpful when alternating between the two (i.e. Ibuprofen at 6am,  tylenol at 9am, ibuprofen at 12pm ...) d. A  prescription for pain medication should be given to you upon discharge.  Take your pain medication as prescribed if your pain is not adequatly controlled with the over-the-counter pain reliefs mentioned above.  4. Avoid getting constipated.  Between the surgery and the pain medications, it is common to experience some constipation.  Increasing fluid  intake and taking a fiber supplement (such as Metamucil, Citrucel, FiberCon, MiraLax, etc) 1-2 times a day regularly will usually help prevent this problem from occurring.  A mild laxative (prune juice, Milk of Magnesia, MiraLax, etc) should be taken according to package directions if there are no bowel movements after 48 hours.    Dressing: Ok to remove bandaids/honeycomb. Your wounds have staples in place - these will be removed at your follow-up visit - we typically leave these in place for 2 weeks after surgery. Stoma Care: As instructed by our stoma team while you were here  5. ACTIVITIES as tolerated:   a. Avoid heavy lifting (>10lbs or 1 gallon of milk) for the next 6 weeks. b. You may resume regular (light) daily activities beginning the next day--such as daily self-care, walking, climbing stairs--gradually increasing activities as tolerated.  If you can walk 30 minutes without difficulty, it is safe to try more intense activity such as jogging, treadmill, bicycling, low-impact aerobics.  c. DO NOT PUSH THROUGH PAIN.  Let pain be your guide: If it hurts to do something, don't do it. d. Dennis Bast may drive when you are no longer taking prescription pain medication, you can comfortably wear a seatbelt, and you can safely maneuver your car and apply brakes. e. Dennis Bast may have sexual intercourse when it is comfortable.   6. FOLLOW UP in our office a. Please call [facility] at [phone] to set up an appointment to see your surgeon in the office for  a follow-up appointment approximately 2 weeks after your surgery. b. Make sure that you call for this appointment the day you arrive home to insure a convenient appointment time.  9. If you have disability or family leave forms that need to be completed, you may have them completed by your primary care physician's office; for return to work instructions, please ask our office staff and they will be happy to assist you in obtaining this documentation   When to  call us [phone]: 1. Poor pain control 2. Reactions / problems with new medications (rash/itching, etc)  3. Fever over 101.5 F (38.5 C) 4. Inability to urinate 5. Nausea/vomiting 6. Worsening swelling or bruising 7. Continued bleeding from incision. 8. Increased pain, redness, or drainage from the incision  The clinic staff is available to answer your questions during regular business hours (8:30am-5pm).  Please dont hesitate to call and ask to speak to one of our nurses for clinical concerns.   A surgeon from [facility] is always on call at the hospitals   If you have a medical emergency, go to the nearest emergency room or call 911.  [facility], [place] [address], [place], [place], [place]  [address] MAIN: [id number] FAX: [phone] [email or web address]

## 2017-06-07 NOTE — Progress Notes (Signed)
[id number]/KDTOIZ Davis,BSN,RN3,CCM;[phone]/[facility] NOTIFIED FOR [facility] AND RN, PT AND OT NEEDS.

## 2017-06-07 NOTE — Progress Notes (Signed)
Patient educated and demonstrated on how to empty colostomy bag while sitting over the toilet and cleaning the tail closure. Patient performed task well.

## 2017-06-07 NOTE — Care Management Note (Signed)
Case Management Note  Patient Details  Name: [name] MRN: [id number] Date of Birth: Sep 06, 1945  Subjective/Objective:                  Lap sig coloectomy on [id number]  Action/Plan: Date: June 07, 2017 [name], BSN, [place], [place] Chart and notes review for patient progress and needs. Will follow for case management and discharge needs. No cm or discharge needs present at time of this review. Next review date: [id number]  Expected Discharge Date:  06/09/17               Expected Discharge Plan:  Home/Self Care  In-House Referral:     Discharge planning Services  CM Consult  Post Acute Care Choice:    Choice offered to:     DME Arranged:    DME Agency:     HH Arranged:    HH Agency:     Status of Service:  In process, will continue to follow  If discussed at Long Length of Stay Meetings, dates discussed:    Additional Comments:  Leeroy Cha, RN 06/07/2017, 10:20 AM

## 2017-06-07 NOTE — Plan of Care (Signed)
  Education: Knowledge of General Education information will improve. Demonstrated how to empty colostomy bag 06/07/2017 0158 - Progressing by Mickie Kay, RN

## 2017-06-07 NOTE — Consult Note (Signed)
[facility] Nurse ostomy follow up Stoma type/location: LLQ colostomy. Pouch applied yesterday is intact. Stomal assessment/size: per yesterday Peristomal assessment: intact with erythema noted yesterday. Treatment options for stomal/peristomal skin:  Output : Brown stool.  Patient is independent in emptying.  Will need HHRN for pouch change continued instruction and support. Both have observed one change.  1-page teaching/tip sheet for pouch change provided as well as 3 weeks (6 set ups) worth of supplies. Ostomy pouching: 2pc. 2 and 3/4 inch Education provided: Teaching regarding ADLs (not to lift anything over 10#) reinforces and to increase diet as tolerated.  Reminded to balance periods of activity with periods of rest. Wife in room and feels more comfortable today. Enrolled patient in [place] Start Discharge program: Yes.  Opaque pouches with integrated gas filters to be provided as samples for patient and wife to try. Ready for discharge from an ostomy perspective.  [facility] nursing team will follow while in house, and will remain available to this patient, the nursing, surgical and medical teams.  Thanks, Maudie Flakes, MSN, RN, [place], Arther Abbott  Pager# [phone]

## 2017-06-09 DIAGNOSIS — I2510 Atherosclerotic heart disease of native coronary artery without angina pectoris: Secondary | ICD-10-CM | POA: Diagnosis not present

## 2017-06-09 DIAGNOSIS — E785 Hyperlipidemia, unspecified: Secondary | ICD-10-CM | POA: Diagnosis not present

## 2017-06-09 DIAGNOSIS — E1122 Type 2 diabetes mellitus with diabetic chronic kidney disease: Secondary | ICD-10-CM | POA: Diagnosis not present

## 2017-06-09 DIAGNOSIS — Z87891 Personal history of nicotine dependence: Secondary | ICD-10-CM | POA: Diagnosis not present

## 2017-06-09 DIAGNOSIS — Z7901 Long term (current) use of anticoagulants: Secondary | ICD-10-CM | POA: Diagnosis not present

## 2017-06-09 DIAGNOSIS — Z955 Presence of coronary angioplasty implant and graft: Secondary | ICD-10-CM | POA: Diagnosis not present

## 2017-06-09 DIAGNOSIS — Z79891 Long term (current) use of opiate analgesic: Secondary | ICD-10-CM | POA: Diagnosis not present

## 2017-06-09 DIAGNOSIS — I482 Chronic atrial fibrillation: Secondary | ICD-10-CM | POA: Diagnosis not present

## 2017-06-09 DIAGNOSIS — N183 Chronic kidney disease, stage 3 (moderate): Secondary | ICD-10-CM | POA: Diagnosis not present

## 2017-06-09 DIAGNOSIS — Z433 Encounter for attention to colostomy: Secondary | ICD-10-CM | POA: Diagnosis not present

## 2017-06-09 DIAGNOSIS — I129 Hypertensive chronic kidney disease with stage 1 through stage 4 chronic kidney disease, or unspecified chronic kidney disease: Secondary | ICD-10-CM | POA: Diagnosis not present

## 2017-06-09 DIAGNOSIS — Z794 Long term (current) use of insulin: Secondary | ICD-10-CM | POA: Diagnosis not present

## 2017-06-09 DIAGNOSIS — Z48815 Encounter for surgical aftercare following surgery on the digestive system: Secondary | ICD-10-CM | POA: Diagnosis not present

## 2017-06-09 DIAGNOSIS — I714 Abdominal aortic aneurysm, without rupture: Secondary | ICD-10-CM | POA: Diagnosis not present

## 2017-06-09 DIAGNOSIS — E1136 Type 2 diabetes mellitus with diabetic cataract: Secondary | ICD-10-CM | POA: Diagnosis not present

## 2017-06-13 ENCOUNTER — Other Ambulatory Visit: Payer: Self-pay | Admitting: *Deleted

## 2017-06-13 NOTE — Patient Outreach (Signed)
[facility]) Care Management  06/13/2017  [name] 11-Mar-1946 [id number]   [facility] Follow Up Outreach  Referral Date: 09/29/16 Referral Source: self referral Reason for Referral: Not able to afford prescriptions due to cost Insurance: [employer]   Outreach Attempt:  Successful telephone outreach to patient for post hospital follow up.  HIPAA verified with patient.  Patient underwent a laparoscopic colectomy with end colostomy and bilateral ureteral stents on 06/03/2017.  He was discharged home with wife to receive home health assistance on 06/07/2017.   Patient states he has done well since being discharged home.  Pain is manageable.  Tolerating solid foods and states he is able to manage his colostomy.  Verbalizes home health nurse came out last Thursday and is due to come for a visit on tomorrow.  Patient reports his blood sugars have ranged 99-140's.  States he went to appointment with surgeon, Dr. Dema Severin today where his staples were removed.  Patient stating he has a slight rash near his incision that the physician is monitoring.  Also reports that steri strips were not placed on the incision.  Encouraged patient to pay close attention to incision to recognize any opening of wound.  Appointments:  Patient attended scheduled appointment with surgeon, Dr. Dema Severin today and has another follow up appointment on 06/21/2017.  States he has scheduled appointment with Dr. Cristina Gong, gastroenterologist on 06/23/2017.  States he believes his next appointment with PA Tobie Lords is in March 2019 for his wellness visit.  He has scheduled appointment with Dr. Cruzita Lederer, endocrinologist on 07/28/2017.  Plan:  RN Health Coach will make monthly follow up appointment with patient in the month of February.  [place] [phone] [name]

## 2017-06-14 ENCOUNTER — Other Ambulatory Visit: Payer: Self-pay

## 2017-06-14 ENCOUNTER — Emergency Department (HOSPITAL_COMMUNITY): Payer: Medicare Other

## 2017-06-14 ENCOUNTER — Observation Stay (HOSPITAL_COMMUNITY)
Admission: EM | Admit: 2017-06-14 | Discharge: 2017-06-15 | Disposition: A | Discharge status: Home / Self Care | Payer: Medicare Other | Attending: Internal Medicine | Admitting: Internal Medicine

## 2017-06-14 ENCOUNTER — Encounter (HOSPITAL_COMMUNITY): Payer: Self-pay

## 2017-06-14 DIAGNOSIS — I482 Chronic atrial fibrillation: Secondary | ICD-10-CM

## 2017-06-14 DIAGNOSIS — Z9049 Acquired absence of other specified parts of digestive tract: Secondary | ICD-10-CM | POA: Insufficient documentation

## 2017-06-14 DIAGNOSIS — K625 Hemorrhage of anus and rectum: Secondary | ICD-10-CM

## 2017-06-14 DIAGNOSIS — E1159 Type 2 diabetes mellitus with other circulatory complications: Secondary | ICD-10-CM | POA: Insufficient documentation

## 2017-06-14 DIAGNOSIS — K9401 Colostomy hemorrhage: Principal | ICD-10-CM | POA: Diagnosis present

## 2017-06-14 DIAGNOSIS — R109 Unspecified abdominal pain: Secondary | ICD-10-CM | POA: Diagnosis not present

## 2017-06-14 DIAGNOSIS — Z7901 Long term (current) use of anticoagulants: Secondary | ICD-10-CM

## 2017-06-14 DIAGNOSIS — E1136 Type 2 diabetes mellitus with diabetic cataract: Secondary | ICD-10-CM | POA: Diagnosis not present

## 2017-06-14 DIAGNOSIS — Z933 Colostomy status: Secondary | ICD-10-CM

## 2017-06-14 DIAGNOSIS — I129 Hypertensive chronic kidney disease with stage 1 through stage 4 chronic kidney disease, or unspecified chronic kidney disease: Secondary | ICD-10-CM | POA: Insufficient documentation

## 2017-06-14 DIAGNOSIS — Z794 Long term (current) use of insulin: Secondary | ICD-10-CM | POA: Insufficient documentation

## 2017-06-14 DIAGNOSIS — IMO0002 Reserved for concepts with insufficient information to code with codable children: Secondary | ICD-10-CM | POA: Diagnosis present

## 2017-06-14 DIAGNOSIS — I2510 Atherosclerotic heart disease of native coronary artery without angina pectoris: Secondary | ICD-10-CM | POA: Diagnosis not present

## 2017-06-14 DIAGNOSIS — Y833 Surgical operation with formation of external stoma as the cause of abnormal reaction of the patient, or of later complication, without mention of misadventure at the time of the procedure: Secondary | ICD-10-CM | POA: Diagnosis not present

## 2017-06-14 DIAGNOSIS — Z833 Family history of diabetes mellitus: Secondary | ICD-10-CM | POA: Diagnosis not present

## 2017-06-14 DIAGNOSIS — Z820 Family history of epilepsy and other diseases of the nervous system: Secondary | ICD-10-CM | POA: Insufficient documentation

## 2017-06-14 DIAGNOSIS — Z87891 Personal history of nicotine dependence: Secondary | ICD-10-CM | POA: Insufficient documentation

## 2017-06-14 DIAGNOSIS — I10 Essential (primary) hypertension: Secondary | ICD-10-CM | POA: Diagnosis not present

## 2017-06-14 DIAGNOSIS — Z955 Presence of coronary angioplasty implant and graft: Secondary | ICD-10-CM | POA: Diagnosis not present

## 2017-06-14 DIAGNOSIS — K922 Gastrointestinal hemorrhage, unspecified: Secondary | ICD-10-CM | POA: Insufficient documentation

## 2017-06-14 DIAGNOSIS — I714 Abdominal aortic aneurysm, without rupture, unspecified: Secondary | ICD-10-CM | POA: Diagnosis present

## 2017-06-14 DIAGNOSIS — E1151 Type 2 diabetes mellitus with diabetic peripheral angiopathy without gangrene: Secondary | ICD-10-CM | POA: Diagnosis present

## 2017-06-14 DIAGNOSIS — Z79899 Other long term (current) drug therapy: Secondary | ICD-10-CM | POA: Insufficient documentation

## 2017-06-14 DIAGNOSIS — D62 Acute posthemorrhagic anemia: Secondary | ICD-10-CM | POA: Diagnosis not present

## 2017-06-14 DIAGNOSIS — K56699 Other intestinal obstruction unspecified as to partial versus complete obstruction: Secondary | ICD-10-CM

## 2017-06-14 DIAGNOSIS — I4891 Unspecified atrial fibrillation: Secondary | ICD-10-CM | POA: Diagnosis present

## 2017-06-14 DIAGNOSIS — E1122 Type 2 diabetes mellitus with diabetic chronic kidney disease: Secondary | ICD-10-CM | POA: Insufficient documentation

## 2017-06-14 DIAGNOSIS — Z8249 Family history of ischemic heart disease and other diseases of the circulatory system: Secondary | ICD-10-CM | POA: Diagnosis not present

## 2017-06-14 DIAGNOSIS — E785 Hyperlipidemia, unspecified: Secondary | ICD-10-CM | POA: Diagnosis present

## 2017-06-14 DIAGNOSIS — N189 Chronic kidney disease, unspecified: Secondary | ICD-10-CM | POA: Diagnosis not present

## 2017-06-14 DIAGNOSIS — E1165 Type 2 diabetes mellitus with hyperglycemia: Secondary | ICD-10-CM | POA: Diagnosis present

## 2017-06-14 LAB — BASIC METABOLIC PANEL
Anion gap: 8 (ref 5–15)
BUN: 15 mg/dL (ref 6–20)
CALCIUM: 8.8 mg/dL — AB (ref 8.9–10.3)
CHLORIDE: 109 mmol/L (ref 101–111)
CO2: 23 mmol/L (ref 22–32)
Creatinine, Ser: 1.4 mg/dL — ABNORMAL HIGH (ref 0.61–1.24)
GFR calc non Af Amer: 49 mL/min — ABNORMAL LOW (ref >60)
GFR, EST AFRICAN AMERICAN: 57 mL/min — AB (ref >60)
GLUCOSE: 106 mg/dL — AB (ref 65–99)
POTASSIUM: 4 mmol/L (ref 3.5–5.1)
Sodium: 140 mmol/L (ref 135–145)

## 2017-06-14 LAB — TYPE AND SCREEN
ABO/RH(D): O POS
Antibody Screen: NEGATIVE

## 2017-06-14 LAB — CBC WITH DIFFERENTIAL/PLATELET
BASOS PCT: 0 %
Basophils Absolute: 0 10*3/uL (ref 0.0–0.1)
Eosinophils Absolute: 0.2 10*3/uL (ref 0.0–0.7)
Eosinophils Relative: 2 %
HEMATOCRIT: 31.9 % — AB (ref 39.0–52.0)
HEMOGLOBIN: 10.4 g/dL — AB (ref 13.0–17.0)
LYMPHS PCT: 25 %
Lymphs Abs: 2 10*3/uL (ref 0.7–4.0)
MCH: 30.7 pg (ref 26.0–34.0)
MCHC: 32.6 g/dL (ref 30.0–36.0)
MCV: 94.1 fL (ref 78.0–100.0)
MONO ABS: 0.8 10*3/uL (ref 0.1–1.0)
MONOS PCT: 10 %
NEUTROS ABS: 4.9 10*3/uL (ref 1.7–7.7)
NEUTROS PCT: 63 %
Platelets: 217 10*3/uL (ref 150–400)
RBC: 3.39 MIL/uL — ABNORMAL LOW (ref 4.22–5.81)
RDW: 13.8 % (ref 11.5–15.5)
WBC: 7.8 10*3/uL (ref 4.0–10.5)

## 2017-06-14 LAB — CBC
HEMATOCRIT: 29.4 % — AB (ref 39.0–52.0)
HEMOGLOBIN: 9.6 g/dL — AB (ref 13.0–17.0)
MCH: 30.6 pg (ref 26.0–34.0)
MCHC: 32.7 g/dL (ref 30.0–36.0)
MCV: 93.6 fL (ref 78.0–100.0)
Platelets: 195 10*3/uL (ref 150–400)
RBC: 3.14 MIL/uL — AB (ref 4.22–5.81)
RDW: 13.8 % (ref 11.5–15.5)
WBC: 6.8 10*3/uL (ref 4.0–10.5)

## 2017-06-14 LAB — GLUCOSE, CAPILLARY: GLUCOSE-CAPILLARY: 164 mg/dL — AB (ref 65–99)

## 2017-06-14 LAB — PROTIME-INR
INR: 2.03
Prothrombin Time: 22.8 seconds — ABNORMAL HIGH (ref 11.4–15.2)

## 2017-06-14 LAB — I-STAT CG4 LACTIC ACID, ED: LACTIC ACID, VENOUS: 0.83 mmol/L (ref 0.5–1.9)

## 2017-06-14 LAB — CBG MONITORING, ED: Glucose-Capillary: 78 mg/dL (ref 65–99)

## 2017-06-14 LAB — APTT: APTT: 41 s — AB (ref 24–36)

## 2017-06-14 LAB — HEMOGLOBIN: Hemoglobin: 9.4 g/dL — ABNORMAL LOW (ref 13.0–17.0)

## 2017-06-14 MED ORDER — SILVER NITRATE-POT NITRATE 75-25 % EX MISC
5.0000 | Freq: Once | CUTANEOUS | Status: AC
  Administered 2017-06-14: 5 via TOPICAL
  Filled 2017-06-14: qty 5

## 2017-06-14 MED ORDER — RAMIPRIL 10 MG PO CAPS
10.0000 mg | ORAL_CAPSULE | Freq: Every day | ORAL | Status: DC
  Administered 2017-06-14 – 2017-06-15 (×2): 10 mg via ORAL
  Filled 2017-06-14 (×2): qty 1

## 2017-06-14 MED ORDER — METOPROLOL TARTRATE 25 MG PO TABS
25.0000 mg | ORAL_TABLET | Freq: Two times a day (BID) | ORAL | Status: DC
  Administered 2017-06-14 – 2017-06-15 (×3): 25 mg via ORAL
  Filled 2017-06-14 (×3): qty 1

## 2017-06-14 MED ORDER — SODIUM CHLORIDE 0.9 % IV SOLN
INTRAVENOUS | Status: DC
  Administered 2017-06-14: 06:00:00 via INTRAVENOUS

## 2017-06-14 MED ORDER — IOPAMIDOL (ISOVUE-300) INJECTION 61%
INTRAVENOUS | Status: AC
  Filled 2017-06-14: qty 100

## 2017-06-14 MED ORDER — ATORVASTATIN CALCIUM 20 MG PO TABS
20.0000 mg | ORAL_TABLET | Freq: Every day | ORAL | Status: DC
  Administered 2017-06-14: 20 mg via ORAL
  Filled 2017-06-14 (×2): qty 1

## 2017-06-14 MED ORDER — PANTOPRAZOLE SODIUM 40 MG IV SOLR
40.0000 mg | Freq: Once | INTRAVENOUS | Status: AC
  Administered 2017-06-14: 40 mg via INTRAVENOUS
  Filled 2017-06-14: qty 40

## 2017-06-14 MED ORDER — SODIUM CHLORIDE 0.9% FLUSH
3.0000 mL | Freq: Two times a day (BID) | INTRAVENOUS | Status: DC
  Administered 2017-06-14 – 2017-06-15 (×2): 3 mL via INTRAVENOUS

## 2017-06-14 MED ORDER — IOPAMIDOL (ISOVUE-300) INJECTION 61%: 100.0000 mL | Freq: Once | INTRAVENOUS | Status: DC | PRN

## 2017-06-14 MED ORDER — SODIUM CHLORIDE 0.9% FLUSH: 3.0000 mL | INTRAVENOUS | Status: DC | PRN

## 2017-06-14 MED ORDER — INSULIN ASPART PROT & ASPART (70-30 MIX) 100 UNIT/ML ~~LOC~~ SUSP
20.0000 [IU] | Freq: Two times a day (BID) | SUBCUTANEOUS | Status: DC
  Administered 2017-06-15: 20 [IU] via SUBCUTANEOUS
  Filled 2017-06-14: qty 10

## 2017-06-14 MED ORDER — SODIUM CHLORIDE 0.9 % IV SOLN: 250.0000 mL | INTRAVENOUS | Status: DC | PRN

## 2017-06-14 MED ORDER — INSULIN ISOPHANE & REGULAR (HUMAN 70-30)100 UNIT/ML KWIKPEN: 20.0000 [IU] | PEN_INJECTOR | Freq: Two times a day (BID) | SUBCUTANEOUS | Status: DC

## 2017-06-14 NOTE — ED Notes (Signed)
Bed: WTR5 Expected date:  Expected time:  Means of arrival:  Comments: 

## 2017-06-14 NOTE — ED Notes (Signed)
[facility] SURGEON SPEAKING WITH OSTOMY RN. HE STATES OSTOMY RN NOT NEEDED AT THIS TIME. SURGEON ABLE TO RE ESTABLISH OSTOMY PLACED EARLIER BY NIGHT SHIFT RN. PT AND FAMILY VERBALIZED UNDERSTANDING OF HOME CARE PER SURGEON.

## 2017-06-14 NOTE — ED Notes (Signed)
PT IS HOH. HEARING AIDS ARE NOT IN

## 2017-06-14 NOTE — ED Notes (Signed)
Dr.Nanaviti at bedside to evaluate pt 

## 2017-06-14 NOTE — Care Management Obs Status (Signed)
[facility] NOTIFICATION   Patient Details  Name: YOSHIO SELIGA MRN: [id number] Date of Birth: 05/07/1946   Medicare Observation Status Notification Given:  Yes    Mida Cory, Benjaman Lobe, RN 06/14/2017, 1:32 PM

## 2017-06-14 NOTE — ED Provider Notes (Signed)
8:31 AM Dr. Dema Severin has seen and evaluated the patient and feels the bleeding is coming from the skin at the ostomy site.  He has applied silver nitrate to this area.  There is no further bleeding and he has been having brown stool without blood.  Dr. Dema Severin would like to cancel the CT scan but agrees with one more hemoglobin and if stable, discharge home for outpatient follow-up.  Reconsult if hemoglobin is dropping.  10:21 AM hemoglobin has dropped to 9.6.  He is not bleeding any further and so this may be equilibration.  Discussed with surgery, who asks for medicine admit for obs and serial hemoglobins.  11:01 AM Dr. Shanon Brow to admit for obs.   Sherwood Gambler, MD 06/14/17 1101

## 2017-06-14 NOTE — ED Notes (Signed)
SURGERY AT BEDSIDE. REMOVED COL STOMY

## 2017-06-14 NOTE — ED Notes (Signed)
[facility] Provider at bedside. ADMINISTERING SILVER NITRATE

## 2017-06-14 NOTE — ED Provider Notes (Signed)
[facility] DEPT Provider Note   CSN: [id number] Arrival date & time: 06/14/17  0203     History   Chief Complaint Chief Complaint  Patient presents with  . Post-op Problem    HPI [name] is a 72 y.o. male.  HPI  72 year old with history of AAA, A. fib on Xarelto, CAD, DM, severe diverticulitis status post hemicolectomy and ostomy bag placement last week, who comes into the ER with chief complaint of bright red blood per ostomy bag.  Patient states that in the middle the night he felt like he wet himself, but when he went to the bathroom to check he noted blood gushing out of the ostomy bag.  Since that time patient has changed the ostomy bag twice, both times it was filled with bright red blood per rectum.  Patient denies any history of GERD/gastritis, and he denies any epigastric abdominal pain.  Patient was seen by surgery recently and had his staples removed. Pt is having dizziness.  Past Medical History:  Diagnosis Date  . AAA (abdominal aortic aneurysm) ([place])   . Allergy   . Atrial fibrillation [facility]) October 2013   Xarelto started; duration unknown  . Cataract   . Chronic kidney disease    Stage III   . Coronary artery disease    post bare-metal stent to LAD in 2001  . Diabetes mellitus    type 2   . Hypertension   . Myocardial infarct ([place]) 2001  . Obesity     Patient Active Problem List   Diagnosis Date Noted  . A-fib ([place]) 03/28/2017  . Diverticulitis 03/28/2017  . History of hearing problem 03/28/2017  . H/O cataract 03/28/2017  . Uncontrolled type 2 diabetes mellitus with circulatory disorder 04/15/2014  . Abdominal aneurysm without mention of rupture 08/03/2011  . Hyperlipidemia 07/06/2008  . HYPERTENSION, BENIGN 07/06/2008  . CAD, NATIVE VESSEL 07/06/2008    Past Surgical History:  Procedure Laterality Date  . CARDIAC CATHETERIZATION  2006   stable with primarily nonobstructive disease  . CARDIOVERSION   03/27/2012   Procedure: CARDIOVERSION;  Surgeon: Carlena Bjornstad, MD;  Location: [facility] ENDOSCOPY;  Service: Cardiovascular;  Laterality: N/A;  . CORONARY ANGIOPLASTY WITH STENT PLACEMENT  2001   bare-metal stent to the LAD  . CYSTOSCOPY WITH STENT PLACEMENT Bilateral 06/03/2017   Procedure: CYSTOSCOPY WITH STENT PLACEMENT;  Surgeon: Festus Aloe, MD;  Location: [facility] ORS;  Service: Urology;  Laterality: Bilateral;  . HERNIA REPAIR  2006  . LAPAROSCOPIC SIGMOID COLECTOMY N/A 06/03/2017   Procedure: LAPAROSCOPIC  SIGMOID COLECTOMY, COLOSTOMY;  Surgeon: Ileana Roup, MD;  Location: [facility] ORS;  Service: General;  Laterality: N/A;  . SKIN GRAFT  1981       Home Medications    Prior to Admission medications   Medication Sig Start Date End Date Taking? Authorizing Provider  atorvastatin (LIPITOR) 20 MG tablet Take 20 mg by mouth at bedtime.  03/26/14  Yes [provider]  Dulaglutide (TRULICITY) 1.5 MV/7.8IO SOPN Please inject under skin 1.5 mg weekly Patient taking differently: Inject 1.5 mg into the skin every Sunday. IN THE MORNING. 01/06/17  Yes Philemon Kingdom, MD  ferrous sulfate 325 (65 FE) MG tablet Take 325 mg by mouth 3 (three) times a week.   Yes [provider]  glipiZIDE-metformin (METAGLIP) 2.5-500 MG tablet TAKE TWO TABLETS BY MOUTH  TWICE DAILY BEFORE MEALS Patient taking differently: TAKES ONE TABLET BY MOUTH  TWICE DAILY BEFORE MEALS 12/20/16  Yes Philemon Kingdom, MD  Insulin Isophane & Regular Human (HUMULIN 70/30 MIX) (70-30) 100 UNIT/ML PEN Inject under skin 20 units before b'fast and 20 units before dinner Patient taking differently: Inject 20 Units into the skin 2 (two) times daily before a meal. Inject under skin 20 units before b'fast and 20 units before dinner 04/08/17  Yes Philemon Kingdom, MD  metoprolol tartrate (LOPRESSOR) 25 MG tablet TAKE 1 TABLET BY MOUTH TWO  TIMES DAILY Patient taking differently: TAKE 1 TABLET (25 MG) BY MOUTH TWO TIMES  DAILY 03/03/17  Yes Burnell Blanks, MD  nitroGLYCERIN (NITROSTAT) 0.4 MG SL tablet Place 0.4 mg under the tongue every 5 (five) minutes as needed.   Yes [provider]  omeprazole (PRILOSEC) 20 MG capsule Take 20 mg by mouth daily as needed (for acid reflux/indigestion). Reported on 11/13/2015   Yes [provider]  Probiotic Product (PROBIOTIC-10 PO) Take 1 capsule by mouth every other day.    Yes [provider]  ramipril (ALTACE) 10 MG capsule Take 10 mg by mouth daily.   Yes [provider]  traMADol (ULTRAM) 50 MG tablet Take 1 tablet (50 mg total) by mouth every 6 (six) hours as needed for up to 7 days (pain not controlled with tylenol). 06/07/17 06/14/17 Yes White, Sharon Mt, MD  XARELTO 20 MG TABS tablet TAKE 1 TABLET BY MOUTH  DAILY Patient taking differently: TAKE 1 TABLET BY MOUTH  DAILY IN THE EVENING. 01/18/17  Yes Burnell Blanks, MD  Insulin Pen Needle (RELION PEN NEEDLES) 32G X 4 MM MISC Use 2x a day as advised 06/29/16   Philemon Kingdom, MD    Family History Family History  Problem Relation Age of Onset  . Heart disease Father   . Heart attack Father   . ALS Mother   . Diabetes Sister   . Hypertension Sister   . Heart failure Brother   . Stroke Neg Hx     Social History Social History   Tobacco Use  . Smoking status: Former Smoker    Packs/day: 0.50    Years: 40.00    Pack years: 20.00    Types: Cigarettes, Pipe, Cigars    Last attempt to quit: 05/11/1999    Years since quitting: 18.1  . Smokeless tobacco: Never Used  Substance Use Topics  . Alcohol use: No  . Drug use: No     Allergies   Patient has no known allergies.   Review of Systems Review of Systems  Gastrointestinal: Positive for abdominal pain.  Hematological: Bruises/bleeds easily.  All other systems reviewed and are negative.    Physical Exam Updated Vital Signs BP 129/82 (BP Location: Left Arm)   Pulse 97   Temp 98 F (36.7 C)  (Oral)   Resp 15   Ht 6\' 2"  (1.88 m)   Wt 98.9 kg (218 lb)   SpO2 98%   BMI 27.99 kg/m   Physical Exam  Constitutional: He is oriented to person, place, and time. He appears well-developed.  HENT:  Head: Atraumatic.  Neck: Neck supple.  Cardiovascular: Normal rate.  Pulmonary/Chest: Effort normal.  Abdominal: Soft. There is no tenderness.  Neurological: He is alert and oriented to person, place, and time.  Skin: Skin is warm.  Nursing note and vitals reviewed.        ED Treatments / Results  Labs (all labs ordered are listed, but only abnormal results are displayed) Labs Reviewed  CBC WITH DIFFERENTIAL/PLATELET - Abnormal; Notable for  the following components:      Result Value   RBC 3.39 (*)    Hemoglobin 10.4 (*)    HCT 31.9 (*)    All other components within normal limits  PROTIME-INR  APTT  I-STAT CG4 LACTIC ACID, ED  TYPE AND SCREEN    EKG  EKG Interpretation None       Radiology No results found.  Procedures Procedures (including critical care time)  Medications Ordered in ED Medications  0.9 %  sodium chloride infusion ( Intravenous New Bag/Given 06/14/17 0548)  pantoprazole (PROTONIX) injection 40 mg (40 mg Intravenous Given 06/14/17 0549)     Initial Impression / Assessment and Plan / ED Course  I have reviewed the triage vital signs and the nursing notes.  Pertinent labs & imaging results that were available during my care of the patient were reviewed by me and considered in my medical decision making (see chart for details).    DDx includes: Esophagitis Mallory Weiss tear [place]  Variceal bleeding PUD/Gastritis/ulcers Diverticular bleed Colon cancer Rectal bleed Internal hemorrhoids External hemorrhoids  72 year old who is on Xarelto for A. fib, had a recent hemicolectomy due to diverticulitis and has history of AAA comes in with bloody stools. Patient has had large amount of blood loss tonight.  On my exam we can see a  specific source of bleed, therefore suspicion is that patient is having intraluminal bleed.  Surgery has been consulted and they will see the patient.  Patient has history of AAA.  I doubt that he is having aortoenteric fistula, however given the large amount of bleed and him being on anticoagulant we will get a CAT scan.  Final Clinical Impressions(s) / ED Diagnoses   Final diagnoses:  BRBPR (bright red blood per rectum)    ED Discharge Orders    None       Varney Biles, MD 06/14/17 [phone]

## 2017-06-14 NOTE — Consult Note (Signed)
[facility] Nurse ostomy consult note Stoma type/location: LLQ Colostomy. Stomal assessment/size:Slightly less than 2 inches inches round, red, moist, slightly budded. Evidence of chemical cautery noted at mucosal/epidermal junction where Dr. Dema Severin used AgNO3 to control the bleeding. Peristomal assessment: intact.  Three prominent creases: one proximal, one distal and one centrally (through the ostomy at 3 and 9 o'clock). Treatment options for stomal/peristomal skin: skin barrier ring and convex pouch Output: brown stool near stomal os at time of pouch change.  No bleeding noted. Ostomy pouching: 1pc.  Education provided: None required.  Patient knew that reportable signs and symptoms of ostomy emergent conditions and presented to ED as needed for excessive bleeding from stoma. Unfortunately, wife present with patient has not taken anti hyperglycemic medications today; states that she wants to stay with husband until room is available. Note:  Patient is wearing an infant sock over the tail closure of the ostomy pouching system to pad the Lock and Roll closure so that it does not irritate his medial thigh/groin. Enrolled patient in [place] program: Yes, previously  [facility] nursing team will follow if admitted, and will remain available to this patient, the nursing, surgical and medical teams.   Thanks, Maudie Flakes, MSN, RN, [place], Arther Abbott  Pager# [phone]

## 2017-06-14 NOTE — H&P (Signed)
History and Physical    [name] ZOX:[id number] DOB: 14-Jul-1945 DOA: 06/14/2017  PCP: Cyndi Bender, PA-C  Patient coming from: Home  Chief Complaint: Bleeding from ostomy  HPI: [name] is a 72 y.o. male with medical history significant of chronic kidney disease, coronary artery disease, A. fib on Xarelto, AAA, diabetes on insulin is status post laparoscopic Hartman's for diverticular stricture on June 03, 2017 was discharged on January 29.  Patient went home has been doing very well his Xarelto was resumed for his A. fib.  He has had no bleeding issues until this morning when his bag was full of bright red blood.  He did not have any associated pain with this.  He did not have any associated nausea vomiting.  He has not had any fevers.  General surgery has seen the patient in the ED and noted an area in stoma that had a small clot on it that was likely the source of the bleeding.  Silver nitrate was applied to the area that he has had no bleeding since.  However his hemoglobin has dropped from 11.9-9.6 and is being referred for observation for close monitoring of his hemoglobin levels.  His requested medical admission due to his multiple comorbidities.  Review of Systems: As per HPI otherwise 10 point review of systems negative.   Past Medical History:  Diagnosis Date  . AAA (abdominal aortic aneurysm) ([place])   . Allergy   . Atrial fibrillation [facility]) October 2013   Xarelto started; duration unknown  . Cataract   . Chronic kidney disease    Stage III   . Coronary artery disease    post bare-metal stent to LAD in 2001  . Diabetes mellitus    type 2   . Hypertension   . Myocardial infarct ([place]) 2001  . Obesity     Past Surgical History:  Procedure Laterality Date  . CARDIAC CATHETERIZATION  2006   stable with primarily nonobstructive disease  . CARDIOVERSION  03/27/2012   Procedure: CARDIOVERSION;  Surgeon: Carlena Bjornstad, MD;  Location: [facility] ENDOSCOPY;  Service:  Cardiovascular;  Laterality: N/A;  . CORONARY ANGIOPLASTY WITH STENT PLACEMENT  2001   bare-metal stent to the LAD  . CYSTOSCOPY WITH STENT PLACEMENT Bilateral 06/03/2017   Procedure: CYSTOSCOPY WITH STENT PLACEMENT;  Surgeon: Festus Aloe, MD;  Location: [facility] ORS;  Service: Urology;  Laterality: Bilateral;  . HERNIA REPAIR  2006  . LAPAROSCOPIC SIGMOID COLECTOMY N/A 06/03/2017   Procedure: LAPAROSCOPIC  SIGMOID COLECTOMY, COLOSTOMY;  Surgeon: Ileana Roup, MD;  Location: [facility] ORS;  Service: General;  Laterality: N/A;  . SKIN GRAFT  1981     reports that he quit smoking about 18 years ago. His smoking use included cigarettes, pipe, and cigars. He has a 20.00 pack-year smoking history. he has never used smokeless tobacco. He reports that he does not drink alcohol or use drugs.  No Known Allergies  Family History  Problem Relation Age of Onset  . Heart disease Father   . Heart attack Father   . ALS Mother   . Diabetes Sister   . Hypertension Sister   . Heart failure Brother   . Stroke Neg Hx     Prior to Admission medications   Medication Sig Start Date End Date Taking? Authorizing Provider  atorvastatin (LIPITOR) 20 MG tablet Take 20 mg by mouth at bedtime.  03/26/14  Yes [provider]  Dulaglutide (TRULICITY) 1.5 WJ/1.9JY SOPN Please inject under skin 1.5  mg weekly Patient taking differently: Inject 1.5 mg into the skin every Sunday. IN THE MORNING. 01/06/17  Yes Philemon Kingdom, MD  ferrous sulfate 325 (65 FE) MG tablet Take 325 mg by mouth 3 (three) times a week.   Yes [provider]  glipiZIDE-metformin (METAGLIP) 2.5-500 MG tablet TAKE TWO TABLETS BY MOUTH  TWICE DAILY BEFORE MEALS Patient taking differently: TAKES ONE TABLET BY MOUTH  TWICE DAILY BEFORE MEALS 12/20/16  Yes Philemon Kingdom, MD  Insulin Isophane & Regular Human (HUMULIN 70/30 MIX) (70-30) 100 UNIT/ML PEN Inject under skin 20 units before b'fast and 20 units before dinner Patient  taking differently: Inject 20 Units into the skin 2 (two) times daily before a meal. Inject under skin 20 units before b'fast and 20 units before dinner 04/08/17  Yes Philemon Kingdom, MD  metoprolol tartrate (LOPRESSOR) 25 MG tablet TAKE 1 TABLET BY MOUTH TWO  TIMES DAILY Patient taking differently: TAKE 1 TABLET (25 MG) BY MOUTH TWO TIMES DAILY 03/03/17  Yes Burnell Blanks, MD  nitroGLYCERIN (NITROSTAT) 0.4 MG SL tablet Place 0.4 mg under the tongue every 5 (five) minutes as needed.   Yes [provider]  omeprazole (PRILOSEC) 20 MG capsule Take 20 mg by mouth daily as needed (for acid reflux/indigestion). Reported on 11/13/2015   Yes [provider]  Probiotic Product (PROBIOTIC-10 PO) Take 1 capsule by mouth every other day.    Yes [provider]  ramipril (ALTACE) 10 MG capsule Take 10 mg by mouth daily.   Yes [provider]  traMADol (ULTRAM) 50 MG tablet Take 1 tablet (50 mg total) by mouth every 6 (six) hours as needed for up to 7 days (pain not controlled with tylenol). 06/07/17 06/14/17 Yes White, Sharon Mt, MD  XARELTO 20 MG TABS tablet TAKE 1 TABLET BY MOUTH  DAILY Patient taking differently: TAKE 1 TABLET BY MOUTH  DAILY IN THE EVENING. 01/18/17  Yes Burnell Blanks, MD  Insulin Pen Needle (RELION PEN NEEDLES) 32G X 4 MM MISC Use 2x a day as advised 06/29/16   Philemon Kingdom, MD    Physical Exam: Vitals:   06/14/17 0830 06/14/17 0832 06/14/17 0835 06/14/17 1057  BP: 139/84   (!) 146/99  Pulse: 79 85  (!) 106  Resp: 19 19  20   Temp:   97.7 F (36.5 C)   TempSrc:   Oral   SpO2: 98% 98%  98%  Weight:      Height:          Constitutional: NAD, calm, comfortable Vitals:   06/14/17 0830 06/14/17 0832 06/14/17 0835 06/14/17 1057  BP: 139/84   (!) 146/99  Pulse: 79 85  (!) 106  Resp: 19 19  20   Temp:   97.7 F (36.5 C)   TempSrc:   Oral   SpO2: 98% 98%  98%  Weight:      Height:       Eyes: PERRL, lids and  conjunctivae normal ENMT: Mucous membranes are moist. Posterior pharynx clear of any exudate or lesions.Normal dentition.  Neck: normal, supple, no masses, no thyromegaly Respiratory: clear to auscultation bilaterally, no wheezing, no crackles. Normal respiratory effort. No accessory muscle use.  Cardiovascular: Regular rate and rhythm, no murmurs / rubs / gallops. No extremity edema. 2+ pedal pulses. No carotid bruits.  Abdomen: no tenderness, no masses palpated. No hepatosplenomegaly. Bowel sounds positive.  Ostomy bag with no bright red blood or dark melanotic stools.  Stoma with no obvious bleeding.  Musculoskeletal: no clubbing / cyanosis. No joint deformity upper and lower extremities. Good ROM, no contractures. Normal muscle tone.  Skin: no rashes, lesions, ulcers. No induration Neurologic: CN 2-12 grossly intact. Sensation intact, DTR normal. Strength 5/5 in all 4.  Psychiatric: Normal judgment and insight. Alert and oriented x 3. Normal mood.    Labs on Admission: I have personally reviewed following labs and imaging studies  CBC: Recent Labs  Lab 06/07/17 1230 06/14/17 0520 06/14/17 0842  WBC 7.5 7.8 6.8  NEUTROABS 4.4 4.9  --   HGB 11.9* 10.4* 9.6*  HCT 35.2* 31.9* 29.4*  MCV 93.1 94.1 93.6  PLT 203 217 030   Basic Metabolic Panel: Recent Labs  Lab 06/14/17 0520  NA 140  K 4.0  CL 109  CO2 23  GLUCOSE 106*  BUN 15  CREATININE 1.40*  CALCIUM 8.8*   GFR: Estimated Creatinine Clearance: 60.9 mL/min (A) (by C-G formula based on SCr of 1.4 mg/dL (H)). Liver Function Tests: No results for input(s): AST, ALT, ALKPHOS, BILITOT, PROT, ALBUMIN in the last 168 hours. No results for input(s): LIPASE, AMYLASE in the last 168 hours. No results for input(s): AMMONIA in the last 168 hours. Coagulation Profile: Recent Labs  Lab 06/14/17 0520  INR 2.03   Cardiac Enzymes: No results for input(s): CKTOTAL, CKMB, CKMBINDEX, TROPONINI in the last 168 hours. BNP (last 3  results) No results for input(s): PROBNP in the last 8760 hours. HbA1C: No results for input(s): HGBA1C in the last 72 hours. CBG: Recent Labs  Lab 06/07/17 1131 06/07/17 1609  GLUCAP 171* 169*   Lipid Profile: No results for input(s): CHOL, HDL, LDLCALC, TRIG, CHOLHDL, LDLDIRECT in the last 72 hours. Thyroid Function Tests: No results for input(s): TSH, T4TOTAL, FREET4, T3FREE, THYROIDAB in the last 72 hours. Anemia Panel: No results for input(s): VITAMINB12, FOLATE, FERRITIN, TIBC, IRON, RETICCTPCT in the last 72 hours. Urine analysis: No results found for: COLORURINE, APPEARANCEUR, LABSPEC, PHURINE, GLUCOSEU, HGBUR, BILIRUBINUR, KETONESUR, PROTEINUR, UROBILINOGEN, NITRITE, LEUKOCYTESUR Sepsis Labs: !!!!!!!!!!!!!!!!!!!!!!!!!!!!!!!!!!!!!!!!!!!! @LABRCNTIP (procalcitonin:4,lacticidven:4) )No results found for this or any previous visit (from the past 240 hour(s)).   Radiological Exams on Admission: No results found.  Old chart reviewed Case discussed with Dr. 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 Dema Severin in the ED.  General surgery consulted.  We will repeat his H&H later this afternoon to him monitor if it drops any further.  However his bleeding has seemed to have stopped.  Will observe over night.  Active Problems:   Acute blood loss anemia-repeat H&H later this afternoon and again in the morning.  Transfuse if drops below 8.   Hyperlipidemia-statin   HYPERTENSION, BENIGN-continue home meds   Aneurysm of abdominal vessel (HCC)-stable   Uncontrolled type 2 diabetes mellitus with circulatory disorder-continue 70/30   A-fib (HCC)-holding Xarelto overnight   Chronic anticoagulation-again holding Xarelto for the night resume tomorrow if no further bleeding     DVT prophylaxis: SCDs Code Status: Full Family  Communication: None Disposition Plan: Per day team Consults called: General surgery Dr. Dema Severin Admission status: Observation   Wen Merced A MD [facility]  If 7PM-7AM, please contact night-coverage [email or web address] Password [email or web address]  06/14/2017, 11:09 AM

## 2017-06-14 NOTE — ED Notes (Signed)
REQUESTING OSTOMY RN TO BE NOTIFIED FOR EDUCATION ON OSTOMY CARE

## 2017-06-14 NOTE — ED Notes (Signed)
OSTOMY RN AT BEDSIDE

## 2017-06-14 NOTE — Progress Notes (Signed)
Subjective Came in overnight for bright red blood per stoma. Laparoscopic Hartmann's for diverticular stricture 11d ago. Had a colonoscopy 57yr ago where polyps were removed but no other lesions were seen. Is on Xarelto for afib. Hgb 10.4 from 11.9 at discharge. He has not had further bleeding in the last 3 hrs. Stool this morning coming out has been greenish brown - no black/melanotic stool. Appliance taken down, stoma pink and patent; mucocutaneous separation on the inferior aspect but no active bleeding at this time; punctate area that was the likely source with small clot on it. 5 sticks of silver nitrate widely applied to this area for additional assurance.  Pathology returned benign and this was discussed with the patient and his wife.  Objective: Vital signs in last 24 hours: Temp:  [98 F (36.7 C)] 98 F (36.7 C) (02/05 0403) Pulse Rate:  [90-102] 90 (02/05 0644) Resp:  [15-25] 25 (02/05 0644) BP: (114-129)/(80-82) 126/82 (02/05 0644) SpO2:  [97 %-98 %] 97 % (02/05 0644) Weight:  [98.9 kg (218 lb)] 98.9 kg (218 lb) (02/05 0525)    Intake/Output from previous day: No intake/output data recorded. Intake/Output this shift: No intake/output data recorded.  Gen: NAD, comfortable CV: RRR Pulm: Normal work of breathing Abd: Soft, NT/ND Ext: SCDs in place  Lab Results: CBC  Recent Labs    06/14/17 0520  WBC 7.8  HGB 10.4*  HCT 31.9*  PLT 217   BMET Recent Labs    06/14/17 0520  NA 140  K 4.0  CL 109  CO2 23  GLUCOSE 106*  BUN 15  CREATININE 1.40*  CALCIUM 8.8*   PT/INR Recent Labs    06/14/17 0520  LABPROT 22.8*  INR 2.03    Studies/Results:  Anti-infectives: Anti-infectives (From admission, onward)   None       Assessment/Plan: Patient Active Problem List   Diagnosis Date Noted  . A-fib ([place]) 03/28/2017  . Diverticulitis 03/28/2017  . History of hearing problem 03/28/2017  . H/O cataract 03/28/2017  . Uncontrolled type 2 diabetes mellitus  with circulatory disorder 04/15/2014  . Abdominal aneurysm without mention of rupture 08/03/2011  . Hyperlipidemia 07/06/2008  . HYPERTENSION, BENIGN 07/06/2008  . CAD, NATIVE VESSEL 07/06/2008   PLAN -Silver nitrate widely applied to areas of mucocutaneous separation - nothing actively bleeding at this time -Repeat CBC; if hgb stable, planning discharge home   LOS: 0 days   Sharon Mt. Dema Severin, M.D. General and Colorectal Surgery Pgr: [facility]

## 2017-06-14 NOTE — ED Notes (Signed)
ED TO INPATIENT HANDOFF REPORT  Name/Age/Gender [name] 72 y.o. male  Code Status    Code Status Orders  (From admission, onward)        Start     Ordered   06/14/17 1113  Full code  Continuous     06/14/17 1116    Code Status History    Date Active Date Inactive Code Status Order ID Comments User Context   06/03/2017 15:13 06/07/2017 19:54 Full Code [id number]  Ileana Roup, MD Inpatient      Home/SNF/Other Home  Chief Complaint Post-op Problem; Losing Blood  Level of Care/Admitting Diagnosis ED Disposition    ED Disposition Condition Comment   Admit  Hospital Area: [facility] [[id number]]  Level of Care: Med-Surg [16]  Diagnosis: GIB (gastrointestinal bleeding) [829937]  Admitting Physician: Phillips Grout [[id number]]  Attending Physician: Derrill Kay A [[id number]]  PT Class (Do Not Modify): Observation [104]  PT Acc Code (Do Not Modify): Observation [10022]       Medical History Past Medical History:  Diagnosis Date  . AAA (abdominal aortic aneurysm) ([place])   . Allergy   . Atrial fibrillation [facility]) October 2013   Xarelto started; duration unknown  . Cataract   . Chronic kidney disease    Stage III   . Coronary artery disease    post bare-metal stent to LAD in 2001  . Diabetes mellitus    type 2   . Hypertension   . Myocardial infarct ([place]) 2001  . Obesity     Allergies No Known Allergies  IV Location/Drains/Wounds Patient Lines/Drains/Airways Status   Active Line/Drains/Airways    Name:   Placement date:   Placement time:   Site:   Days:   Peripheral IV 06/14/17 Left;Anterior Forearm   06/14/17    0525    Forearm   less than 1   Colostomy LLQ   06/03/17    1127    LLQ   11   Incision (Closed) 06/03/17 Abdomen Other (Comment)   06/03/17    1215     11   Incision - 5 Ports Abdomen Upper;Left;Mid Left;Mid Mid Upper;Right;Mid Right;Mid   06/03/17    0830     11          Labs/Imaging Results for orders placed or  performed during the hospital encounter of 06/14/17 (from the past 48 hour(s))  CBC WITH DIFFERENTIAL     Status: Abnormal   Collection Time: 06/14/17  5:20 AM  Result Value Ref Range   WBC 7.8 4.0 - 10.5 K/uL   RBC 3.39 (L) 4.22 - 5.81 MIL/uL   Hemoglobin 10.4 (L) 13.0 - 17.0 g/dL   HCT 31.9 (L) 39.0 - 52.0 %   MCV 94.1 78.0 - 100.0 fL   MCH 30.7 26.0 - 34.0 pg   MCHC 32.6 30.0 - 36.0 g/dL   RDW 13.8 11.5 - 15.5 %   Platelets 217 150 - 400 K/uL   Neutrophils Relative % 63 %   Neutro Abs 4.9 1.7 - 7.7 K/uL   Lymphocytes Relative 25 %   Lymphs Abs 2.0 0.7 - 4.0 K/uL   Monocytes Relative 10 %   Monocytes Absolute 0.8 0.1 - 1.0 K/uL   Eosinophils Relative 2 %   Eosinophils Absolute 0.2 0.0 - 0.7 K/uL   Basophils Relative 0 %   Basophils Absolute 0.0 0.0 - 0.1 K/uL    Comment: Performed at [facility], [place] [address]  [place], [place] [address]  Protime-INR     Status: Abnormal   Collection Time: 06/14/17  5:20 AM  Result Value Ref Range   Prothrombin Time 22.8 (H) 11.4 - 15.2 seconds   INR 2.03     Comment: Performed at [facility], [place] [address], [place], [place] [address]  Type and screen [place]     Status: None   Collection Time: 06/14/17  5:20 AM  Result Value Ref Range   ABO/RH(D) O POS    Antibody Screen NEG    Sample Expiration      06/17/2017 Performed at [facility], [place] [address], [place], [place] [address]   APTT     Status: Abnormal   Collection Time: 06/14/17  5:20 AM  Result Value Ref Range   aPTT 41 (H) 24 - 36 seconds    Comment:        IF BASELINE aPTT IS ELEVATED, SUGGEST PATIENT RISK ASSESSMENT BE USED TO DETERMINE APPROPRIATE ANTICOAGULANT THERAPY. Performed at [facility], [place] [address], [place], [place] [address]   Basic metabolic panel     Status: Abnormal   Collection Time: 06/14/17  5:20 AM  Result Value Ref Range   Sodium 140 135 - 145  mmol/L   Potassium 4.0 3.5 - 5.1 mmol/L   Chloride 109 101 - 111 mmol/L   CO2 23 22 - 32 mmol/L   Glucose, Bld 106 (H) 65 - 99 mg/dL   BUN 15 6 - 20 mg/dL   Creatinine, Ser 1.40 (H) 0.61 - 1.24 mg/dL   Calcium 8.8 (L) 8.9 - 10.3 mg/dL   GFR calc non Af Amer 49 (L) >60 mL/min   GFR calc Af Amer 57 (L) >60 mL/min    Comment: (NOTE) The eGFR has been calculated using the CKD EPI equation. This calculation has not been validated in all clinical situations. eGFR's persistently <60 mL/min signify possible Chronic Kidney Disease.    Anion gap 8 5 - 15    Comment: Performed at [facility], [place] [address], [place], [place] [address]  I-Stat CG4 Lactic Acid, ED     Status: None   Collection Time: 06/14/17  5:32 AM  Result Value Ref Range   Lactic Acid, Venous 0.83 0.5 - 1.9 mmol/L  CBC     Status: Abnormal   Collection Time: 06/14/17  8:42 AM  Result Value Ref Range   WBC 6.8 4.0 - 10.5 K/uL   RBC 3.14 (L) 4.22 - 5.81 MIL/uL   Hemoglobin 9.6 (L) 13.0 - 17.0 g/dL   HCT 29.4 (L) 39.0 - 52.0 %   MCV 93.6 78.0 - 100.0 fL   MCH 30.6 26.0 - 34.0 pg   MCHC 32.7 30.0 - 36.0 g/dL   RDW 13.8 11.5 - 15.5 %   Platelets 195 150 - 400 K/uL    Comment: Performed at [facility], [place] [address], [place], [place] [address]  POC CBG, ED     Status: None   Collection Time: 06/14/17 12:46 PM  Result Value Ref Range   Glucose-Capillary 78 65 - 99 mg/dL  Hemoglobin     Status: Abnormal   Collection Time: 06/14/17  4:32 PM  Result Value Ref Range   Hemoglobin 9.4 (L) 13.0 - 17.0 g/dL    Comment: Performed at [facility], [place] [address], [place], [place] [address]   No results found.  Pending Labs [employer] (From  admission, onward)   Start     Ordered   06/15/17 0315  Basic metabolic panel  Tomorrow morning,   R     06/14/17 1116   06/15/17 0500  CBC  Tomorrow morning,   R     06/14/17 1116      Vitals/Pain Today's Vitals    06/14/17 1330 06/14/17 1428 06/14/17 1434 06/14/17 1630  BP: 129/85 136/77  129/89  Pulse: (!) 105 (!) 121 100 (!) 103  Resp: (!) 23 18 19 18   Temp:      TempSrc:      SpO2: 97% 97% 96% 96%  Weight:      Height:      PainSc:        Isolation Precautions No active isolations  Medications Medications  iopamidol (ISOVUE-300) 61 % injection (  Canceled Entry 06/14/17 0922)  atorvastatin (LIPITOR) tablet 20 mg (not administered)  Insulin Isophane & Regular Human (HUMULIN 70/30 MIX) (70-30) 100 UNIT/ML 20 Units (not administered)  metoprolol tartrate (LOPRESSOR) tablet 25 mg (25 mg Oral Given 06/14/17 1328)  ramipril (ALTACE) capsule 10 mg (10 mg Oral Given 06/14/17 1327)  sodium chloride flush (NS) 0.9 % injection 3 mL (3 mLs Intravenous Not Given 06/14/17 1200)  sodium chloride flush (NS) 0.9 % injection 3 mL (not administered)  0.9 %  sodium chloride infusion (not administered)  pantoprazole (PROTONIX) injection 40 mg (40 mg Intravenous Given 06/14/17 0549)  silver nitrate applicators applicator 5 Stick (5 Sticks Topical Given 06/14/17 0820)    Mobility walks with device

## 2017-06-14 NOTE — Care Management Note (Signed)
Case Management Note  Patient Details  Name: [name] MRN: [id number] Date of Birth: March 23, 1946  CM noted pt was active with [place] RN PT OT.  Contacted Santiago Glad with [facility] to advise that pt was being admitted.  Will follow for needs.  Expected Discharge Date:   Unknown               Expected Discharge Plan:  [place] Choice:  Home Health Choice offered to:  Patient  HH Arranged:  RN, PT, OT [facility] Agency:  [place]  Status of Service:  In process, will continue to follow  Rae Mar, RN 06/14/2017, 11:55 AM

## 2017-06-14 NOTE — ED Triage Notes (Signed)
Pt had a colon resection and hernia repair on Jan 25th,  His staples were removed yesterday  Last night he noticed blood in his colostomy bag twice The area where the staples were is ok they think but didn't take the barrier off

## 2017-06-15 DIAGNOSIS — K9401 Colostomy hemorrhage: Principal | ICD-10-CM

## 2017-06-15 DIAGNOSIS — K56699 Other intestinal obstruction unspecified as to partial versus complete obstruction: Secondary | ICD-10-CM

## 2017-06-15 DIAGNOSIS — Z933 Colostomy status: Secondary | ICD-10-CM

## 2017-06-15 LAB — BASIC METABOLIC PANEL
ANION GAP: 6 (ref 5–15)
BUN: 11 mg/dL (ref 6–20)
CO2: 25 mmol/L (ref 22–32)
Calcium: 8.9 mg/dL (ref 8.9–10.3)
Chloride: 107 mmol/L (ref 101–111)
Creatinine, Ser: 1.14 mg/dL (ref 0.61–1.24)
GFR calc Af Amer: >60 mL/min (ref >60)
GFR calc non Af Amer: >60 mL/min (ref >60)
GLUCOSE: 131 mg/dL — AB (ref 65–99)
Potassium: 4.3 mmol/L (ref 3.5–5.1)
Sodium: 138 mmol/L (ref 135–145)

## 2017-06-15 LAB — CBC
HCT: 28.7 % — ABNORMAL LOW (ref 39.0–52.0)
HEMOGLOBIN: 9.6 g/dL — AB (ref 13.0–17.0)
MCH: 30.9 pg (ref 26.0–34.0)
MCHC: 33.4 g/dL (ref 30.0–36.0)
MCV: 92.3 fL (ref 78.0–100.0)
Platelets: 214 10*3/uL (ref 150–400)
RBC: 3.11 MIL/uL — ABNORMAL LOW (ref 4.22–5.81)
RDW: 14 % (ref 11.5–15.5)
WBC: 6.2 10*3/uL (ref 4.0–10.5)

## 2017-06-15 LAB — GLUCOSE, CAPILLARY
GLUCOSE-CAPILLARY: 129 mg/dL — AB (ref 65–99)
Glucose-Capillary: 117 mg/dL — ABNORMAL HIGH (ref 65–99)

## 2017-06-15 NOTE — Progress Notes (Signed)
06/15/17  1530  Reviewed discharge instructions with patient. Patient verbalized understanding of discharge instructions. Copy of discharge instructions given to patient.

## 2017-06-15 NOTE — Discharge Instructions (Signed)
Gastrointestinal Bleeding °Gastrointestinal (GI) bleeding is bleeding somewhere along the digestive tract, between the mouth and anus. This can be caused by various problems. The severity of these problems can range from mild to serious or even life-threatening. If you have GI bleeding, you may find blood in your stools (feces), you may have black stools, or you may vomit blood. If there is a lot of bleeding, you may need to stay in the hospital. °What are the causes? °This condition may be caused by: °· Esophagitis. This is inflammation, irritation, or swelling of the esophagus. °· Hemorrhoids. These are swollen veins in the rectum. °· Anal fissures. These are areas of painful tearing that are often caused by passing hard stool. °· Diverticulosis. These are pouches that form on the colon over time, with age, and may bleed a lot. °· Diverticulitis. This is inflammation in areas with diverticulosis. It can cause pain, fever, and bloody stools, although bleeding may be mild. °· Polyps and cancer. Colon cancer often starts out as precancerous polyps. °· Gastritis and ulcers. With these, bleeding may come from the upper GI tract, near the stomach. ° °What are the signs or symptoms? °Symptoms of this condition may include: °· Bright red blood in your vomit, or vomit that looks like coffee grounds. °· Bloody, black, or tarry stools. °? Bleeding from the lower GI tract will usually cause red or maroon blood in the stools. °? Bleeding from the upper GI tract may cause black, tarry, often bad-smelling stools. °? In certain cases, if the bleeding is fast enough, the stools may be red. °· Pain or cramping in the abdomen. ° °How is this diagnosed? °This condition may be diagnosed based on: °· Medical history and physical exam. °· Various tests, such as: °? Blood tests. °? X-rays and other imaging tests. °? Esophagogastroduodenoscopy (EGD). In this test, a flexible, lighted tube is used to look at your esophagus, stomach, and  small intestine. °? Colonoscopy. In this test, a flexible, lighted tube is used to look at your colon. ° °How is this treated? °Treatment for this condition depends on the cause of the bleeding. For example: °· For bleeding from the esophagus, stomach, small intestine, or colon, the health care provider doing your EGD or colonoscopy may be able to stop the bleeding as part of the procedure. °· Inflammation or infection of the colon can be treated with medicines. °· Certain rectal problems can be treated with creams, suppositories, or warm baths. °· Surgery is sometimes needed. °· Blood transfusions are sometimes needed if a lot of blood has been lost. ° °If bleeding is slow, you may be allowed to go home. If there is a lot of bleeding, you will need to stay in the hospital for observation. °Follow these instructions at home: °· Take over-the-counter and prescription medicines only as told by your health care provider. °· Eat foods that are high in fiber. This will help to keep your stools soft. These foods include whole grains, legumes, fruits, and vegetables. Eating 1-3 prunes each day works well for many people. °· Drink enough fluid to keep your urine clear or pale yellow. °· Keep all follow-up visits as told by your health care provider. This is important. °Contact a health care provider if: °· Your symptoms do not improve. °Get help right away if: °· Your bleeding increases. °· You feel light-headed or you faint. °· You feel weak. °· You have severe cramps in your back or abdomen. °· You pass large blood clots in your stool. °·   Your symptoms are getting worse. °This information is not intended to replace advice given to you by your health care provider. Make sure you discuss any questions you have with your health care provider. °Document Released: 04/23/2000 Document Revised: 09/24/2015 Document Reviewed: 10/14/2014 °Elsevier Interactive Patient Education © 2018 Elsevier Inc. ° °

## 2017-06-15 NOTE — Discharge Summary (Signed)
Physician Discharge Summary  [name] JJK:[id number] DOB: [birth year] DOA: 06/14/2017  PCP: Cyndi Bender, PA-C  Admit date: 06/14/2017 Discharge date: 06/15/2017  Admitted From: Home Disposition: Home  Recommendations for Outpatient Follow-up:  1. Follow up with PCP in 1-2 weeks 2. Please obtain BMP/CBC in one week  Home Health: No Equipment/Devices: None  Discharge Condition: Stable CODE STATUS: Full Diet recommendation: Regular Brief/Interim Summary: [name] is a 72 y.o. male with medical history significant of chronic kidney disease, coronary artery disease, A. fib on Xarelto, AAA, diabetes on insulin is status post laparoscopic Hartman's for diverticular stricture on June 03, 2017 was discharged on January 29.  Patient went home has been doing very well his Xarelto was resumed for his A. fib.  He has had no bleeding issues until this morning when his bag was full of bright red blood.  He did not have any associated pain with this.  He did not have any associated nausea vomiting.  He has not had any fevers.  General surgery has seen the patient in the ED and noted an area in stoma that had a small clot on it that was likely the source of the bleeding.  Silver nitrate was applied to the area that he has had no bleeding since.  However his hemoglobin has dropped from 11.9-9.6 and is being referred for observation for close monitoring of his hemoglobin levels.  His requested medical admission due to his multiple comorbidities.   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  His hemoglobin dropped from 11.9-9.6 and was observed.  It stayed stable at 9.6 without any further bleeding.  Patient was discharged home with outpatient follow-up and told to resume his rivaroxaban.  ##) Coronary artery disease: Patient was continued on metoprolol, atorvastatin, ramipril.  ##) Chronic atrial fibrillation: Patient was continue on metoprolol.  Rivaroxaban was held due to GI bleeding.  Patient was told to restart on discharge.  ##) Type 2 diabetes: Patient was maintained on sliding scale insulin here.  Patient was told to restart home insulin on discharge.  Discharge Instructions  Discharge Instructions    Call MD for:  persistant nausea and vomiting   Complete by:  As directed    Call MD for:  severe uncontrolled pain   Complete by:  As directed    Call MD for:  temperature >100.4   Complete by:  As directed    Diet - low sodium heart healthy   Complete by:  As directed    Discharge instructions   Complete by:  As directed    Please follow-up with your PCP in 1-2 weeks.   Increase activity slowly   Complete by:  As directed      Allergies as of 06/15/2017   No Known Allergies     Medication List    STOP taking these medications   traMADol 50 MG tablet Commonly known as:  ULTRAM     TAKE these medications   atorvastatin 20 MG tablet  Commonly known as:  LIPITOR Take 20 mg by mouth at bedtime.   Dulaglutide 1.5 MG/0.5ML Sopn Commonly known as:  TRULICITY Please inject under skin 1.5 mg weekly What changed:    how much to take  how to take this  when to take this  additional instructions   ferrous sulfate 325 (65 FE) MG tablet Take 325 mg by mouth 3 (three) times a week.   glipiZIDE-metformin 2.5-500 MG tablet Commonly known as:  METAGLIP TAKE TWO TABLETS BY MOUTH  TWICE DAILY BEFORE MEALS What changed:  See the new instructions.    Insulin Isophane & Regular Human (70-30) 100 UNIT/ML PEN Commonly known as:  HUMULIN 70/30 MIX Inject under skin 20 units before b'fast and 20 units before dinner What changed:    how much to take  how to take this  when to take this  additional instructions   Insulin Pen Needle 32G X 4 MM Misc Commonly known as:  RELION PEN NEEDLES Use 2x a day as advised   metoprolol tartrate 25 MG tablet Commonly known as:  LOPRESSOR TAKE 1 TABLET BY MOUTH TWO  TIMES DAILY What changed:    how much to take  how to take this  when to take this   nitroGLYCERIN 0.4 MG SL tablet Commonly known as:  NITROSTAT Place 0.4 mg under the tongue every 5 (five) minutes as needed.   omeprazole 20 MG capsule Commonly known as:  PRILOSEC Take 20 mg by mouth daily as needed (for acid reflux/indigestion). Reported on 11/13/2015   PROBIOTIC-10 PO Take 1 capsule by mouth every other day.   ramipril 10 MG capsule Commonly known as:  ALTACE Take 10 mg by mouth daily.   XARELTO 20 MG Tabs tablet Generic drug:  rivaroxaban TAKE 1 TABLET BY MOUTH  DAILY What changed:    how much to take  how to take this  when to take this      Follow-up Information    [facility] [place] Follow up.   Specialty:  Home Health Services Why:  RN PT OT Contact information: [address] [place] [address] [phone]          No Known Allergies  Consultations:  General surgery   Procedures/Studies: Dg Chest 1 View  Result Date: 06/04/2017 CLINICAL DATA:  Status post colectomy and stent placement 2 days ago EXAM: CHEST 1 VIEW COMPARISON:  03/21/2012 FINDINGS: Cardiac shadow is mildly prominent accentuated by the portable technique. Mild bibasilar changes are seen. Free air is noted beneath the right hemidiaphragm consistent with the recent colectomy. No focal infiltrate or sizable effusion is seen. No bony abnormality is noted. IMPRESSION: No acute intrathoracic abnormality  noted. Mild free air consistent with the recent colectomy. Electronically Signed   By: Inez Catalina M.D.   On: 06/04/2017 11:31   Dg C-arm 1-60 Min-no Report  Result Date: 06/03/2017 Fluoroscopy was utilized by the requesting physician.  No radiographic interpretation.      Subjective:   Discharge Exam: Vitals:   06/15/17 0944 06/15/17 1328  BP: 115/78 106/78  Pulse: (!) 109 72  Resp: 16 15  Temp:  97.9 F (36.6 C)  SpO2:  98%   Vitals:   06/14/17 2204 06/15/17 0448 06/15/17 0944 06/15/17 1328  BP: (!) 128/98 (!) 144/87 115/78 106/78  Pulse: 99 89 (!) 109 72  Resp: 19 19 16 15   Temp: 98.2 F (36.8 C) 97.6 F (36.4 C)  97.9 F (36.6 C)  TempSrc:  Oral Oral  Oral  SpO2: 98% 98%  98%  Weight:      Height:        General: Pt is alert, awake, not in acute distress Cardiovascular: RRR, S1/S2 +, no rubs, no gallops Respiratory: CTA bilaterally, no wheezing, no rhonchi Abdominal: Soft, NT, ND, bowel sounds +, colostomy site is clean dry and intact Extremities: no edema, no cyanosis    The results of significant diagnostics from this hospitalization (including imaging, microbiology, ancillary and laboratory) are listed below for reference.     Microbiology: No results found for this or any previous visit (from the past 240 hour(s)).   Labs: BNP (last 3 results) No results for input(s): BNP in the last 8760 hours. Basic Metabolic Panel: Recent Labs  Lab 06/14/17 0520 06/15/17 0600  NA 140 138  K 4.0 4.3  CL 109 107  CO2 23 25  GLUCOSE 106* 131*  BUN 15 11  CREATININE 1.40* 1.14  CALCIUM 8.8* 8.9   Liver Function Tests: No results for input(s): AST, ALT, ALKPHOS, BILITOT, PROT, ALBUMIN in the last 168 hours. No results for input(s): LIPASE, AMYLASE in the last 168 hours. No results for input(s): AMMONIA in the last 168 hours. CBC: Recent Labs  Lab 06/14/17 0520 06/14/17 0842 06/14/17 1632 06/15/17 0600  WBC 7.8 6.8  --  6.2  NEUTROABS 4.9  --   --    --   HGB 10.4* 9.6* 9.4* 9.6*  HCT 31.9* 29.4*  --  28.7*  MCV 94.1 93.6  --  92.3  PLT 217 195  --  214   Cardiac Enzymes: No results for input(s): CKTOTAL, CKMB, CKMBINDEX, TROPONINI in the last 168 hours. BNP: Invalid input(s): POCBNP CBG: Recent Labs  Lab 06/14/17 1246 06/14/17 2208 06/15/17 0725 06/15/17 1158  GLUCAP 78 164* 129* 117*   D-Dimer No results for input(s): DDIMER in the last 72 hours. Hgb A1c No results for input(s): HGBA1C in the last 72 hours. Lipid Profile No results for input(s): CHOL, HDL, LDLCALC, TRIG, CHOLHDL, LDLDIRECT in the last 72 hours. Thyroid function studies No results for input(s): TSH, T4TOTAL, T3FREE, THYROIDAB in the last 72 hours.  Invalid input(s): FREET3 Anemia work up No results for input(s): VITAMINB12, FOLATE, FERRITIN, TIBC, IRON, RETICCTPCT in the last 72 hours. Urinalysis No results found for: COLORURINE, APPEARANCEUR, LABSPEC, [place], GLUCOSEU, HGBUR, BILIRUBINUR, KETONESUR, PROTEINUR, UROBILINOGEN, NITRITE, LEUKOCYTESUR Sepsis Labs Invalid input(s): PROCALCITONIN,  WBC,  LACTICIDVEN Microbiology No results found for this or any previous visit (from the past 240 hour(s)).   Time coordinating discharge: Over 30 minutes  SIGNED:   Cristy Folks, MD  [facility] 06/15/2017, 3:17 PM  If 7PM-7AM, please contact night-coverage [email or web address] Password [email or web address]

## 2017-06-15 NOTE — Progress Notes (Signed)
Subjective No acute events. No further bleeding. Hgb stable. Feeling well, no complaints.  Objective: Vital signs in last 24 hours: Temp:  [97.6 F (36.4 C)-98.8 F (37.1 C)] 97.6 F (36.4 C) (02/06 0448) Pulse Rate:  [83-121] 89 (02/06 0448) Resp:  [18-23] 19 (02/06 0448) BP: (116-156)/(77-99) 144/87 (02/06 0448) SpO2:  [96 %-99 %] 98 % (02/06 0448) Last BM Date: 06/14/17  Intake/Output from previous day: 02/05 0701 - 02/06 0700 In: 340 [P.O.:340] Out: 650 [Urine:650] Intake/Output this shift: No intake/output data recorded.  Gen: NAD, comfortable CV: RRR Pulm: Normal work of breathing Abd: Soft, NT/ND; no blood in appliance. Ext: SCDs in place  Lab Results: CBC  Recent Labs    06/14/17 0842 06/14/17 1632 06/15/17 0600  WBC 6.8  --  6.2  HGB 9.6* 9.4* 9.6*  HCT 29.4*  --  28.7*  PLT 195  --  214   BMET Recent Labs    06/14/17 0520 06/15/17 0600  NA 140 138  K 4.0 4.3  CL 109 107  CO2 23 25  GLUCOSE 106* 131*  BUN 15 11  CREATININE 1.40* 1.14  CALCIUM 8.8* 8.9   PT/INR Recent Labs    06/14/17 0520  LABPROT 22.8*  INR 2.03   ABG No results for input(s): PHART, HCO3 in the last 72 hours.  Invalid input(s): PCO2, PO2  Studies/Results:  Anti-infectives: Anti-infectives (From admission, onward)   None       Assessment/Plan: Patient Active Problem List   Diagnosis Date Noted  . Diverticular stricture s/p colectomy/colostomy 06/03/2017 06/15/2017  . Colostomy in place [facility]) 06/15/2017  . Bleeding from colostomy ([place]) 06/14/2017  . GIB (gastrointestinal bleeding) 06/14/2017  . Chronic anticoagulation 06/14/2017  . Acute blood loss anemia 06/14/2017  . A-fib ([place]) 03/28/2017  . Diverticulitis 03/28/2017  . History of hearing problem 03/28/2017  . H/O cataract 03/28/2017  . Uncontrolled type 2 diabetes mellitus with circulatory disorder 04/15/2014  . Aneurysm of abdominal vessel ([place]) 08/03/2011  . Hyperlipidemia 07/06/2008  .  HYPERTENSION, BENIGN 07/06/2008  . CAD, NATIVE VESSEL 07/06/2008   Laparoscopic Hartmann's 06/03/17 for significant diverticular stricture - admitted for observation following bleeding from edge of stoma while on anticoagulation for atrial fibrillation  PLAN -Diet as tolerated -Oral anticoagulation -Ok for discharge home from out standpoint -He has follow-up to see [place] next week in the office -ER warnings given and instructions on how to tamponade skin bleeding should it recur as well   LOS: 0 days   Sharon Mt. Dema Severin, M.D. General and Colorectal Surgery [facility]

## 2017-06-15 NOTE — Consult Note (Addendum)
[name] Nurse ostomy consult note Stoma type/location: LLQ Colostomy. Stomal assessment: through pouch Treatment options for stomal/peristomal skin: skin barrier ring and convex pouch Output: brown stool, no bleeding noted. Ostomy pouching: 1pc.  Education provided: Patient inquiring about list of foods that should be avoided while on blood thinner. Educated patient that Xarelto is unlike other anticoagulants in that food does not effect it. There are no foods that need to be avoided in order to keep bleeding times in order.  Enrolled patient in [place] program: Yes, previously Patient believes he is to be discharged today back on Xarelto as usual.  We will follow this patient if he remains in house after today.  Fara Olden, RN-C, WTA-C, [place] [facility] Ostomy Care Associate

## 2017-06-15 NOTE — Plan of Care (Signed)
  Nutrition: Adequate nutrition will be maintained 06/15/2017 1257 - Progressing by Dorene Sorrow, RN   Safety: Ability to remain free from injury will improve 06/15/2017 1257 - Progressing by Dorene Sorrow, RN   Activity: Risk for activity intolerance will decrease 06/15/2017 1257 - Progressing by Dorene Sorrow, RN

## 2017-06-22 DIAGNOSIS — Z933 Colostomy status: Secondary | ICD-10-CM | POA: Diagnosis not present

## 2017-06-22 DIAGNOSIS — K5732 Diverticulitis of large intestine without perforation or abscess without bleeding: Secondary | ICD-10-CM | POA: Diagnosis not present

## 2017-06-23 DIAGNOSIS — D62 Acute posthemorrhagic anemia: Secondary | ICD-10-CM | POA: Diagnosis not present

## 2017-06-23 DIAGNOSIS — K56699 Other intestinal obstruction unspecified as to partial versus complete obstruction: Secondary | ICD-10-CM | POA: Diagnosis not present

## 2017-06-24 ENCOUNTER — Encounter: Payer: Self-pay | Admitting: *Deleted

## 2017-06-24 ENCOUNTER — Other Ambulatory Visit: Payer: Self-pay | Admitting: *Deleted

## 2017-06-24 NOTE — Patient Outreach (Signed)
[facility] [facility]) Care Management  06/24/2017  DONAVON KIMREY 1945/09/15 [id number]   [place] Monthly Outreach  Referral Date: 09/29/16 Referral Source: self referral Reason for Referral: Not able to afford prescriptions due to cost Insurance: [employer]    Outreach Attempt:  Successful telephone outreach to patient for monthly follow up.  Patient requesting I speak with wife.  HIPAA confirmed with wife per patient's request.  Wife stating overall patient is doing well.  Reports recent overnight observation hospitalization stay for bleeding ostomy/stoma.  States no bleeding from stoma noted since discharge from the hospital.  Patient and wife state blood sugars readings have been well.  This mornings fasting sugar was 74, for which patient was able to catch hypoglycemic episode, eat a snack and repeat was 117.  He and wife both reports not many hypoglycemic events and very rare hyperglycemic events.  States compliance with medications.  Verbalizes he is tolerating meals without pain, nausea or vomiting.  Denies fevers and states surgical incision is healed.  Only issue patient and wife reporting is the frequency of ostomy changes.  States they were told one ostomy bag should last about 3 days and due to leakage, wife is having to change his ostomy about every day.  Wife states they are in the process of obtaining the correct ostomy supplies ordered from [facility] and in the meantime they have gotten supplies from a local supply store. Reports they are no longer receiving home health assistance and would like suggestions on how to prevent or minimize leakage from ostomy.  RN Health Coach contacted [facility] for suggestions and she suggested having home health nurse out to assess ostomy.  Patient and wife gave verbal permission for [facility] to contact [facility] to reestablish home health services.  Spoke with Sharee Pimple with  [facility], whom states they can resume services with new orders.  Updated Sharee Pimple with [facility] that patient and wife was requesting different nurse to come out.  Spoke with Sharee Pimple about Electrical engineer Nurse out to see patient and was informed the Ostomy Nurse worked remotely with the other home care nurses and only visited patient's for special circumstances.  RN Health Coach spoke with [place] Triage Nurse at Dr. Orest Dikes office to request new orders for Home Health RN for ostomy care for the patient.  Chemira reports she will send in the home health orders to [facility] at fax number [phone].  RN Health Coach notified patient and wife of new home health care request.  Patient and wife appreciative.  Encouraged patient and wife to contact [facility] if they had not been contacted by [facility] within the next week for resumption of services.  They stated their understanding.  Appointments:  Reports seeing Dr. Dema Severin with surgery on 06/22/2017 and next follow up on 08/29/2017.  Has scheduled appointment with Dr. Cruzita Lederer, Endocrinology on 07/28/2017.  Has seen Dr. Cristina Gong with GI on 06/23/2017.  Patient stating he has medical follow up scheduled for 10/13/2017 and has his wellness exam on 09/09/2017.  Plan: RN Health Coach requested new Home Health Nursing order from Dr. Dema Severin for ostomy care. RN Health Coach contacted [place] to resume home health nursing care to assist wife with managing ostomy care. RN Health Coach to send patient 2019 Calendar Booklet to help with medical appointments and blood sugar documentation. RN Health Coach to send patient Industrial/product designer. RN  Health Coach will make next monthly outreach to patient in the month of March.  [place] [phone] [email or web address]

## 2017-06-28 ENCOUNTER — Other Ambulatory Visit: Payer: Self-pay | Admitting: *Deleted

## 2017-06-28 NOTE — Patient Outreach (Signed)
[facility]) Care Management  06/28/2017  [name] 01/05/1946 [id number]    [place] Monthly Outreach  Referral Date: 09/29/16 Referral Source: self referral Reason for Referral: Not able to afford prescriptions due to cost Insurance: [employer]    Outreach Attempt:  Received telephone call from patient's wife.  HIPAA verified with wife.  Wife states patient has just been seen by the Ostomy Nurse at [facility] from a referral by Dr. Orest Dikes office.  States Ostomy nurse has given suggestions to assist with the leakage and frequent ostomy changes and states the can have 2 more visits to the wound center for education.  Patient and wife is wondering if there is still a need for home health services with [facility].  RN Health Coach spoke with Mardene Celeste, Dr. Orest Dikes assistant/nurse and she verified referral to [facility] has not been placed as of yet.  Mardene Celeste states she will contact [name] at the end of the week to see if they may need home health assistance prior to placing the order for home health care.  RN Health Coach relayed this information to patient's wife to be expecting a call from Mardene Celeste at Dr. Orest Dikes office by the end of the week to check on the status of ostomy care and need for assistance.  Wife stated her understanding and appreciative of the assistance.  Plan:  RN Health Coach will make next monthly outreach to patient in the month of March.  [name] [phone] [name]

## 2017-07-06 DIAGNOSIS — Z933 Colostomy status: Secondary | ICD-10-CM | POA: Diagnosis not present

## 2017-07-06 DIAGNOSIS — K5732 Diverticulitis of large intestine without perforation or abscess without bleeding: Secondary | ICD-10-CM | POA: Diagnosis not present

## 2017-07-19 ENCOUNTER — Other Ambulatory Visit: Payer: Self-pay | Admitting: Internal Medicine

## 2017-07-22 DIAGNOSIS — Z933 Colostomy status: Secondary | ICD-10-CM | POA: Diagnosis not present

## 2017-07-22 DIAGNOSIS — K5732 Diverticulitis of large intestine without perforation or abscess without bleeding: Secondary | ICD-10-CM | POA: Diagnosis not present

## 2017-07-28 ENCOUNTER — Ambulatory Visit: Payer: Medicare Other | Admitting: Internal Medicine

## 2017-07-28 ENCOUNTER — Encounter: Payer: Self-pay | Admitting: Internal Medicine

## 2017-07-28 VITALS — BP 126/74 | HR 70 | Ht 74.0 in | Wt 220.8 lb

## 2017-07-28 DIAGNOSIS — E1165 Type 2 diabetes mellitus with hyperglycemia: Secondary | ICD-10-CM | POA: Diagnosis not present

## 2017-07-28 DIAGNOSIS — E1151 Type 2 diabetes mellitus with diabetic peripheral angiopathy without gangrene: Secondary | ICD-10-CM

## 2017-07-28 DIAGNOSIS — E785 Hyperlipidemia, unspecified: Secondary | ICD-10-CM | POA: Diagnosis not present

## 2017-07-28 DIAGNOSIS — IMO0002 Reserved for concepts with insufficient information to code with codable children: Secondary | ICD-10-CM

## 2017-07-28 NOTE — Patient Instructions (Signed)
Please continue: - Trulicity 1.5 mg weekly  - Insulin 70/30:  20 units before breakfast  20 units before dinner  Please stop: - Metformin-glipizide 500-2.5 mg  Please return in 4 months with your sugar log.

## 2017-07-28 NOTE — Progress Notes (Signed)
Patient ID: [name], male   DOB: 1945/12/25, 72 y.o.   MRN: [id number]  HPI: [name] is a 71 y.o.-year-old male, returning for f/u for DM2, dx 1995, insulin-dependent, uncontrolled, with complications (CKD stage 3, CAD - AMI, s/p stent LAD 2001). Last visit 4 he is here with his wife mo ago.  Who offers part of the history, especially related to his recent hospitalization, insulin doses and blood sugars at home.  Since last visit, he continued diverticulitis flares (sees Dr. Dodd Schmid Gong).  He had multiple antibiotic courses but continued to have nausea, vomiting, diarrhea, and losing weight.  Since last visit, he was admitted with bright red blood per rectum and ended up having a colectomy 05/2017.  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  He is feeling good and his appetite increased recently.  However, he still has bloating and also liquid stoma output and she is wondering whether this can be related to metformin.  Last hemoglobin A1c was: Lab Results  Component Value Date   HGBA1C 5.5 05/31/2017   HGBA1C 6.3 04/08/2017   HGBA1C 6.8 01/06/2017   He is on on: - Metformin-glipizide 500-2.5 mg 1 tablet 2x a day - Trulicity 1.5 mg weekly  - Insulin 70/30:  20 units before breakfast  20 units before dinner He was on Tanzeum 50 mg weekly (started 09/2015) - >200$ per month! >> Trulicity 1.5 mg weekly  Pt checks his sugars twice a day: - am:  108, 125-173 >> 92-147, 205, 211 >> 116-165 - 2h after b'fast: 131 >> n/c >> 223 >> n/c - before lunch: 198, 215 >> n/c - 2h after lunch: 184, 231 >> 179-228 >> n/c - before dinner: 133-242 >> 74-185, 202, 225 >> 108-150, 161 - 2h after dinner:  n/c >> 176 >> 196 >> n/c - bedtime:88, 113-192, 240 >> 129, 162 >> n/c >> 76, 181 - nighttime: n/c >> 168 >> 43-67, 156 >> 74-109 Lowest sugar was 43 at night (was on clear liquids) >>74 he does not have hypoglycemia awareness! Highest sugar was 228 >>  181  Pt's meals are: - Breakfast: eggs, toast, ham/sausage or cereals + milk - Lunch: sandwich, vegetables, meat - Dinner: seafood, meat, vegetables - Snacks: nabs  Reviewed labs from PCP:  04/13/2017: - Lipids: 124/65/39/72 - CMP normal, glucose 98, BUN/creatinine 10/1.3, GFR 55, potassium 3.4 (3.5-5.2), Ca 8.1 (8.6-10.2) - CBC with differential: low Hb 11.3 (13-17.7), low RBC 3.57 (4.14-5.8) - ferritin 167 - PSA 0.5  09/30/2015: - Hep C virus antibody <0.1 - Lipids: 126/68/44/68 - CMP normal, except glucose 209, BUN/creatinine 22/1.39, GFR 51, potassium 5.4 (3.5-5.2), CO2 17 (18-29) - CBC with differential normal - B12 vitamin 305 (17-510)  04/01/2015: - ACR 56.3 - CBC with differential normal - Lipids: 116/56/42/63 - CMP: Glucose 193, BUN/creatinine 21/1.23, GFR 60, LFTs normal - PSA 0.5    -+ CKD stage III - + see above: Lab Results  Component Value Date   BUN 11 06/15/2017   CREATININE 1.14 06/15/2017  On r ramipril. -+ HL; lipids - + see above: Lab Results  Component Value Date   CHOL 131 03/28/2014   HDL 47 03/28/2014   LDLCALC 68 03/28/2014   TRIG 82 03/28/2014  On Lipitor.  - last eye exam was in 05/2017: No DR -No numbness and tingling in his feet.   ROS: Constitutional: + weight loss, no fatigue, no subjective hyperthermia, no subjective hypothermia  Eyes: no blurry vision, no xerophthalmia ENT: no sore throat, no nodules palpated in throat, no dysphagia, no odynophagia, no hoarseness Cardiovascular: no CP/no SOB/no palpitations/no leg swelling Respiratory: no cough/no SOB/no wheezing Gastrointestinal: no N/no V/no D/no C/no acid reflux Musculoskeletal: no muscle aches/no joint aches Skin: no rashes, no hair loss Neurological: no tremors/no numbness/no tingling/no dizziness  I reviewed pt's medications, allergies, PMH, social hx, family hx, and changes were documented in the history of present illness. Otherwise, unchanged from my initial visit  note.  Past Medical History:  Diagnosis Date  . AAA (abdominal aortic aneurysm) ([place])   . Allergy   . Atrial fibrillation [facility]) October 2013   Xarelto started; duration unknown  . Cataract   . Chronic kidney disease    Stage III   . Coronary artery disease    post bare-metal stent to LAD in 2001  . Diabetes mellitus    type 2   . Hypertension   . Myocardial infarct ([place]) 2001  . Obesity    Past Surgical History:  Procedure Laterality Date  . CARDIAC CATHETERIZATION  2006   stable with primarily nonobstructive disease  . CARDIOVERSION  03/27/2012   Procedure: CARDIOVERSION;  Surgeon: Carlena Bjornstad, MD;  Location: [facility] ENDOSCOPY;  Service: Cardiovascular;  Laterality: N/A;  . CORONARY ANGIOPLASTY WITH STENT PLACEMENT  2001   bare-metal stent to the LAD  . CYSTOSCOPY WITH STENT PLACEMENT Bilateral 06/03/2017   Procedure: CYSTOSCOPY WITH STENT PLACEMENT;  Surgeon: Festus Aloe, MD;  Location: [facility] ORS;  Service: Urology;  Laterality: Bilateral;  . HERNIA REPAIR  2006  . LAPAROSCOPIC SIGMOID COLECTOMY N/A 06/03/2017   Procedure: LAPAROSCOPIC  SIGMOID COLECTOMY, COLOSTOMY;  Surgeon: Ileana Roup, MD;  Location: [facility] ORS;  Service: General;  Laterality: N/A;  . SKIN GRAFT  1981   Social History Main Topics  . Smoking status: Former Smoker -- 0.50 packs/day for 40 years    Types: Cigarettes, Pipe, Cigars    Quit date: 05/11/1999  . Smokeless tobacco: Never Used  . Alcohol Use: No  . Drug Use: No   Social History Narrative   Married    Retired: [employer]   3 grown children   Current Outpatient Medications on File Prior to Visit  Medication Sig Dispense Refill  . atorvastatin (LIPITOR) 20 MG tablet Take 20 mg by mouth at bedtime.     . Dulaglutide (TRULICITY) 1.5 JI/9.6VE SOPN Please inject under skin 1.5 mg weekly (Patient taking differently: Inject 1.5 mg into the skin every Sunday. IN THE MORNING.) 12 pen 3  . ferrous sulfate 325 (65 FE) MG tablet Take 325 mg by mouth 3  (three) times a week.    Marland Kitchen glipiZIDE-metformin (METAGLIP) 2.5-500 MG tablet TAKE TWO TABLETS BY MOUTH  TWICE DAILY BEFORE MEALS (Patient taking differently: TAKES ONE TABLET BY MOUTH  TWICE DAILY BEFORE MEALS) 360 tablet 1  . Insulin Isophane & Regular Human (HUMULIN 70/30 MIX) (70-30) 100 UNIT/ML PEN Inject under skin 20 units before b'fast and 20 units before dinner (Patient taking differently: Inject 20 Units into the skin 2 (two) times daily before a meal. Inject under skin 20 units before b'fast and 20 units before dinner) 45 mL 3  . metoprolol tartrate (LOPRESSOR) 25 MG tablet TAKE 1 TABLET BY MOUTH TWO  TIMES DAILY (Patient taking differently: TAKE 1 TABLET (25 MG) BY MOUTH TWO TIMES DAILY) 180 tablet 2  . nitroGLYCERIN (NITROSTAT) 0.4 MG SL tablet Place 0.4 mg under the tongue every 5 (five)  minutes as needed.    Marland Kitchen omeprazole (PRILOSEC) 20 MG capsule Take 20 mg by mouth daily as needed (for acid reflux/indigestion). Reported on 11/13/2015    . Probiotic Product (PROBIOTIC-10 PO) Take 1 capsule by mouth every other day.     . ramipril (ALTACE) 10 MG capsule Take 10 mg by mouth daily.    Marland Kitchen RELION PEN NEEDLES 32G X 4 MM MISC USE TWO TIMES A DAY AS ADVISED 100 each 7  . XARELTO 20 MG TABS tablet TAKE 1 TABLET BY MOUTH  DAILY (Patient taking differently: TAKE 1 TABLET BY MOUTH  DAILY IN THE EVENING.) 90 tablet 2   No current facility-administered medications on file prior to visit.    No Known Allergies Family History  Problem Relation Age of Onset  . Heart disease Father   . Heart attack Father   . ALS Mother   . Diabetes Sister   . Hypertension Sister   . Heart failure Brother   . Stroke Neg Hx    PE: BP 126/74   Pulse 70   Ht 6\' 2"  (1.88 m)   Wt 220 lb 12.8 oz (100.2 kg)   SpO2 97%   BMI 28.35 kg/m  Body mass index is 28.35 kg/m. Wt Readings from Last 3 Encounters:  07/28/17 220 lb 12.8 oz (100.2 kg)  06/14/17 218 lb (98.9 kg)  06/03/17 220 lb 0.3 oz (99.8 kg)    Constitutional: overweight, in NAD Eyes: PERRLA, EOMI, no exophthalmos ENT: moist mucous membranes, no thyromegaly, no cervical lymphadenopathy Cardiovascular: RRR, No MRG Respiratory: CTA B Gastrointestinal: abdomen soft, NT, ND, BS+ Musculoskeletal: no deformities, strength intact in all 4 Skin: moist, warm, no rashes Neurological: no tremor with outstretched hands, DTR normal in all 4  ASSESSMENT: 1. DM2, insulin-dependent, uncontrolled, with complications - CAD - CKD stage 3  2. HL  PLAN:  1. Patient with long-standing, previously uncontrolled diabetes, now with much better control on oral antidiabetic regimen, basal-bolus insulin regimen and GLP-1 receptor agonist.  He is getting Trulicity through the patient assistant program.  We had to decrease his insulin doses at last visits as he was not eating well due to his diverticulitis episodes.  We also ended up decreasing his metformin-glipizide combination dose. Since last visit, he had colectomy in 05/2017. -He is complaining about bloating and liquid stoma output and I agree with him that this could be related to metformin, so we discussed about stopping the metformin-glipizide combination, especially since he also has some low blood sugars at night, in the 70s.  We may restart this in the future, if he starts eating more regular meals and especially after the reversal of his colectomy. - I suggested to:  Patient Instructions  Please continue: - Trulicity 1.5 mg weekly  - Insulin 70/30:  20 units before breakfast  20 units before dinner  Please stop: - Metformin-glipizide 500-2.5 mg  Please return in 4 months with your sugar log.  - Most recent HbA1c was perfect, at 5.5% 2 months ago.   - continue checking sugars at different times of the day - check 2-3x a day, rotating checks - advised for yearly eye exams >> he is UTD - Return to clinic in 4 mo with sugar log     2. HL  He continues on Lipitor without side  effects  Reviewed most recent lipid panel: LDL at goal  Philemon Kingdom, MD PhD [facility] Endocrinology

## 2017-07-29 ENCOUNTER — Other Ambulatory Visit: Payer: Self-pay | Admitting: *Deleted

## 2017-07-29 NOTE — Patient Outreach (Signed)
[facility]) Care Management  07/29/2017  ANDRIEL OMALLEY [birth year] [id number]   [place] Monthly Outreach  Referral Date: 09/29/16 Referral Source: self referral Reason for Referral: Not able to afford prescriptions due to cost Insurance: [employer]   Outreach Attempt:  Outreach attempt #1 to patient for monthly follow up. No answer. RN Health Coach left HIPAA compliant voicemail message along with contact information.  Plan:  RN Health Coach will make another outreach attempt to patient within one business day if no return call back from patient.  [place] [phone] [email or web address]

## 2017-08-01 ENCOUNTER — Other Ambulatory Visit: Payer: Self-pay | Admitting: *Deleted

## 2017-08-01 NOTE — Patient Outreach (Signed)
[facility]) Care Management  08/01/2017  [name] [birth year] [id number]   [place] Monthly Outreach  Referral Date: 09/29/16 Referral Source: self referral Reason for Referral: Not able to afford prescriptions due to cost Insurance: [employer]   Outreach Attempt:  Outreach attempt #2 to patient for monthly follow up. No answer. RN Health Coach left HIPAA compliant voicemail message along with contact information.  Plan:  RN Health Coach will send unsuccessful outreach letter to patient.  RN Health Coach will make final telephone outreach attempt to patient within the next 10 business days.  [place] [phone] [name]

## 2017-08-11 DIAGNOSIS — K5732 Diverticulitis of large intestine without perforation or abscess without bleeding: Secondary | ICD-10-CM | POA: Diagnosis not present

## 2017-08-11 DIAGNOSIS — Z933 Colostomy status: Secondary | ICD-10-CM | POA: Diagnosis not present

## 2017-08-12 ENCOUNTER — Other Ambulatory Visit: Payer: Self-pay | Admitting: *Deleted

## 2017-08-12 ENCOUNTER — Encounter: Payer: Self-pay | Admitting: *Deleted

## 2017-08-12 NOTE — Patient Outreach (Signed)
[place] [facility]) Care Management  [place]  08/12/2017   [name] 10-Jan-1946 [id number]   [place] Monthly Outreach   Referral Date: 09/29/16 Referral Source: self referral Reason for Referral: Not able to afford prescriptions due to cost Insurance: [employer]   Outreach Attempt:  Successful telephone outreach to patient for monthly follow up.  HIPAA verified with wife per patient's request.  Patient and wife state patient is doing well.  Reports appetite is great and has increased 3 pounds in his weight.  States he continues to have an increase in ostomy output at nighttime, getting up multiple times to empty or burp ostomy bag to prevent leaking.  Wife reports possible blisters on stoma and surrounding skin irritation.  They have arranged an appointment with the Ostomy Nurse at [facility] for next week to discuss stoma and surrounding skin.  Encouraged them to keep and attend this appointment.  Discussed current Hgb A1C of 5.5 and the stopping of oral hypoglycemics.  Metformin was discontinued also to help with ostomy output.  Reports ostomy output still about the same as prior to stopping metformin.  Fasting CBG this morning 138.  Reports about 2-3 hypoglycemic episodes in the past few weeks.  Discussed with patient him nearing program goals and possible program closure in the next few months.  Patient in agree ance.  Encounter Medications:  Outpatient Encounter Medications as of 08/12/2017  Medication Sig Note  . atorvastatin (LIPITOR) 20 MG tablet Take 20 mg by mouth at bedtime.    . Dulaglutide (TRULICITY) 1.5 EN/2.7PO SOPN Please inject under skin 1.5 mg weekly (Patient taking differently: Inject 1.5 mg into the skin every Sunday. IN THE MORNING.)   . ferrous sulfate 325 (65 FE) MG tablet Take 325 mg by mouth 3 (three) times a week.   . Insulin Isophane & Regular Human (HUMULIN 70/30 MIX) (70-30) 100 UNIT/ML PEN  Inject under skin 20 units before b'fast and 20 units before dinner (Patient taking differently: Inject 20 Units into the skin 2 (two) times daily before a meal. Inject under skin 20 units before b'fast and 20 units before dinner)   . metoprolol tartrate (LOPRESSOR) 25 MG tablet TAKE 1 TABLET BY MOUTH TWO  TIMES DAILY (Patient taking differently: TAKE 1 TABLET (25 MG) BY MOUTH TWO TIMES DAILY)   . nitroGLYCERIN (NITROSTAT) 0.4 MG SL tablet Place 0.4 mg under the tongue every 5 (five) minutes as needed.   Marland Kitchen omeprazole (PRILOSEC) 20 MG capsule Take 20 mg by mouth daily as needed (for acid reflux/indigestion). Reported on 11/13/2015   . Probiotic Product (PROBIOTIC-10 PO) Take 1 capsule by mouth every other day.    . ramipril (ALTACE) 10 MG capsule Take 10 mg by mouth daily.   Marland Kitchen RELION PEN NEEDLES 32G X 4 MM MISC USE TWO TIMES A DAY AS ADVISED   . XARELTO 20 MG TABS tablet TAKE 1 TABLET BY MOUTH  DAILY (Patient taking differently: TAKE 1 TABLET BY MOUTH  DAILY IN THE EVENING.)   . glipiZIDE-metformin (METAGLIP) 2.5-500 MG tablet TAKE TWO TABLETS BY MOUTH  TWICE DAILY BEFORE MEALS (Patient not taking: Reported on 08/12/2017) 08/12/2017: Reports this medication was stopped on 07/28/2017 by Dr. Cruzita Lederer    No facility-administered encounter medications on file as of 08/12/2017.     Functional Status:  In your present state of health, do you have any difficulty performing the following activities: 06/14/2017 06/14/2017  Hearing? - -  Comment - -  Vision? - -  Difficulty concentrating or making decisions? - -  Walking or climbing stairs? - -  Dressing or bathing? - -  Doing errands, shopping? N N  Preparing Food and eating ? - -  Using the Toilet? - -  In the past six months, have you accidently leaked urine? - -  Do you have problems with loss of bowel control? - -  Managing your Medications? - -  Managing your Finances? - -  Housekeeping or managing your Housekeeping? - -  Some recent data might be hidden     Fall/Depression Screening: Fall Risk  08/12/2017 06/01/2017 03/02/2017  Falls in the past year? No No No   PHQ 2/9 Scores 03/02/2017 10/06/2016 10/01/2016 10/03/2014  PHQ - 2 Score 0 0 0 0    [facility] CM Care Plan Problem One     Most Recent Value  Care Plan Problem One  knowledge deficiet related to post operative care  Role Documenting the Problem One  [place] for Problem One  Active  [facility] Long Term Goal   Patient will maintain a Hgb A1C of 6.5 or less in the next 90 days  [facility] Long Term Goal Start Date  08/12/17  [facility] Long Term Goal Met Date  08/12/17  Interventions for Problem One Long Term Goal  Current care plan reviewed and discussed with patient, reviewed medications and medication changes with patient and wife, encouraged medication compliance, discussed food options and food choices with recent colon surgery and new colostomy, encourged increased activity,  reviewed signs and symptoms of hypo and hyperglycemia  [facility] CM Short Term Goal #1   Patient will report attending appointment with Wound Ostomy Nurse in the next 30 days  [facility] CM Short Term Goal #1 Start Date  08/12/17  [facility] CM Short Term Goal #1 Met Date  08/12/17  Interventions for Short Term Goal #1  Discussed importance of ostomy maintainance, encouraged patient to attend appointment next week with ostomy nurse, discussed home health options with patient and wife,   [facility] CM Short Term Goal #2   Patient will report less skin irritation around stoma in the next 30 days  [facility] CM Short Term Goal #2 Start Date  08/12/17  [facility] CM Short Term Goal #2 Met Date  08/12/17  Interventions for Short Term Goal #2  Encouraged patient and wife to attend ostomy nurse appointment next week, discussed possible skin treating ointments with patient and wife, encouraged wife to discuss skin treatment options with ostomy nurse, supported/encouraged patient and wife on great job so far with ostomy care, discussed food options to decrease or bulk up  ostomy output     Appointments:  Patient reports last seeing his primary care provider in December 2019 and states he has a well visit scheduled for 05/13/4313 with Solmon Ice and sees Cyndi Bender PA on 10/13/2017.  Seen Dr. Cruzita Lederer on 07/28/2017 with Endocrinology and has scheduled follow up appointment on 11/30/2017.  Plan:  RN Health Coach will make next monthly outreach to patient in the month of May.  RN Health Coach will send primary MD Quarterly Update.   [place] [phone] [email or web address]

## 2017-08-22 ENCOUNTER — Other Ambulatory Visit: Payer: Self-pay

## 2017-08-22 DIAGNOSIS — R0989 Other specified symptoms and signs involving the circulatory and respiratory systems: Secondary | ICD-10-CM

## 2017-09-02 DIAGNOSIS — Z933 Colostomy status: Secondary | ICD-10-CM | POA: Diagnosis not present

## 2017-09-02 DIAGNOSIS — K5732 Diverticulitis of large intestine without perforation or abscess without bleeding: Secondary | ICD-10-CM | POA: Diagnosis not present

## 2017-09-04 ENCOUNTER — Other Ambulatory Visit: Payer: Self-pay | Admitting: Internal Medicine

## 2017-09-12 ENCOUNTER — Ambulatory Visit: Payer: Medicare Other | Admitting: Family

## 2017-09-12 ENCOUNTER — Ambulatory Visit (HOSPITAL_COMMUNITY)
Admission: RE | Admit: 2017-09-12 | Discharge: 2017-09-12 | Disposition: A | Discharge status: Home / Self Care | Payer: Medicare Other | Source: Ambulatory Visit | Attending: Family | Admitting: Family

## 2017-09-12 ENCOUNTER — Encounter: Payer: Self-pay | Admitting: Family

## 2017-09-12 ENCOUNTER — Ambulatory Visit (INDEPENDENT_AMBULATORY_CARE_PROVIDER_SITE_OTHER)
Admission: RE | Admit: 2017-09-12 | Discharge: 2017-09-12 | Disposition: A | Discharge status: Home / Self Care | Payer: Medicare Other | Source: Ambulatory Visit | Attending: Family | Admitting: Family

## 2017-09-12 VITALS — BP 143/87 | HR 72 | Temp 96.7°F | Ht 75.0 in | Wt 223.7 lb

## 2017-09-12 DIAGNOSIS — Z87891 Personal history of nicotine dependence: Secondary | ICD-10-CM

## 2017-09-12 DIAGNOSIS — R0989 Other specified symptoms and signs involving the circulatory and respiratory systems: Secondary | ICD-10-CM

## 2017-09-12 DIAGNOSIS — E785 Hyperlipidemia, unspecified: Secondary | ICD-10-CM | POA: Insufficient documentation

## 2017-09-12 DIAGNOSIS — I714 Abdominal aortic aneurysm, without rupture, unspecified: Secondary | ICD-10-CM

## 2017-09-12 DIAGNOSIS — E119 Type 2 diabetes mellitus without complications: Secondary | ICD-10-CM | POA: Diagnosis not present

## 2017-09-12 DIAGNOSIS — E1151 Type 2 diabetes mellitus with diabetic peripheral angiopathy without gangrene: Secondary | ICD-10-CM | POA: Diagnosis not present

## 2017-09-12 DIAGNOSIS — I2510 Atherosclerotic heart disease of native coronary artery without angina pectoris: Secondary | ICD-10-CM | POA: Insufficient documentation

## 2017-09-12 DIAGNOSIS — I10 Essential (primary) hypertension: Secondary | ICD-10-CM | POA: Insufficient documentation

## 2017-09-12 DIAGNOSIS — Z794 Long term (current) use of insulin: Secondary | ICD-10-CM | POA: Diagnosis not present

## 2017-09-12 NOTE — Patient Instructions (Signed)
Before your next abdominal ultrasound:  Take two Extra-Strength Gas-X capsules at bedtime the night before the test. Take another two Extra-Strength Gas-X capsules 3 hours before the test.  Avoid gas forming foods and beverages the day before the test.      Abdominal Aortic Aneurysm Blood pumps away from the heart through tubes (blood vessels) called arteries. Aneurysms are weak or damaged places in the wall of an artery. It bulges out like a balloon. An abdominal aortic aneurysm happens in the main artery of the body (aorta). It can burst or tear, causing bleeding inside the body. This is an emergency. It needs treatment right away. What are the causes? The exact cause is unknown. Things that could cause this problem include:  Fat and other substances building up in the lining of a tube.  Swelling of the walls of a blood vessel.  Certain tissue diseases.  Belly (abdominal) trauma.  An infection in the main artery of the body.  What increases the risk? There are things that make it more likely for you to have an aneurysm. These include:  Being over the age of 72 years old.  Having high blood pressure (hypertension).  Being a male.  Being white.  Being very overweight (obese).  Having a family history of aneurysm.  Using tobacco products.  What are the signs or symptoms? Symptoms depend on the size of the aneurysm and how fast it grows. There may not be symptoms. If symptoms occur, they can include:  Pain (belly, side, lower back, or groin).  Feeling full after eating a small amount of food.  Feeling sick to your stomach (nauseous), throwing up (vomiting), or both.  Feeling a lump in your belly that feels like it is beating (pulsating).  Feeling like you will pass out (faint).  How is this treated?  Medicine to control blood pressure and pain.  Imaging tests to see if the aneurysm gets bigger.  Surgery. How is this prevented? To lessen your chance of  getting this condition:  Stop smoking. Stop chewing tobacco.  Limit or avoid alcohol.  Keep your blood pressure, blood sugar, and cholesterol within normal limits.  Eat less salt.  Eat foods low in saturated fats and cholesterol. These are found in animal and whole dairy products.  Eat more fiber. Fiber is found in whole grains, vegetables, and fruits.  Keep a healthy weight.  Stay active and exercise often.  This information is not intended to replace advice given to you by your health care provider. Make sure you discuss any questions you have with your health care provider. Document Released: 08/21/2012 Document Revised: 10/02/2015 Document Reviewed: 05/26/2012 Elsevier Interactive Patient Education  2017 [employer]  

## 2017-09-12 NOTE — Progress Notes (Signed)
[facility] OF    CC: Follow up Abdominal Aortic Aneurysm  History of Present Illness  [name] is a 72 y.o. (1945-12-24) male whom Dr. Donnetta Hutching has been monitoring for a small AAA.  Pt underwent a CT scan for workup of hematuria. He had an incidental finding of a 3.6 cm infrarenal abdominal aortic aneurysm.  There is also some ectasia of the right common iliac artery. He had no known knowledge of this aneurysm. He has no symptoms of the aneurysm.  He does have a history of prior myocardial infarction 2001 and had a bare-metal stent placed that time.   The patient does not have acute back or abdominal pain.  He has degenerative disc disease in his back, but this has improved with physical therapy and exercise.    The patient denies claudication in his legs with walking. He denies any history of stroke or TIA symptoms.   He had a colectomy in January 2019 for diverticulitis, has a colostomy; sees a gastroenterologist.    He takes Xarelto for atrial fib., Dr. Julianne Handler is his cardiologist.   Pt Diabetic: Yes, review of records indicates his last A1C was 5.5 (05-31-17), seeing an endocrinologist, Dr. Monna Fam Pt smoker: former smoker, quit 2001   Past Medical History:  Diagnosis Date  . AAA (abdominal aortic aneurysm) ([place])   . Allergy   . Atrial fibrillation [place]) October 2013   Xarelto started; duration unknown  . Cataract   . Chronic kidney disease    Stage III   . Coronary artery disease    post bare-metal stent to LAD in 2001  . Diabetes mellitus    type 2   . Hypertension   . Myocardial infarct ([place]) 2001  . Obesity    Past Surgical History:  Procedure Laterality Date  . CARDIAC CATHETERIZATION  2006   stable with primarily nonobstructive disease  . CARDIOVERSION  03/27/2012   Procedure: CARDIOVERSION;  Surgeon: Carlena Bjornstad, MD;  Location[facility] END[facility]COPY;  Service: Cardiovascular;  Laterality: N/A;  . CORONARY ANGIOPLASTY WITH STENT  PLACEMENT  2001   bare-metal stent to the LAD  . CYSTOSCOPY WITH STENT PLACEMENT Bilateral 06/03/2017   Procedure: CYSTOSCOPY WITH STENT PLACEMENT;  Surgeon: Festus Aloe, MD;  Location: [facility] ORS;  Service: Urology;  Laterality: Bilateral;  . HERNIA REPAIR  2006  . LAPAROSCOPIC SIGMOID COLECTOMY N/A 06/03/2017   Procedure: LAPAROSCOPIC  SIGMOID COLECTOMY, COLOSTOMY;  Surgeon: Ileana Roup, MD;  Location: [facility] ORS;  Service: General;  Laterality: N/A;  . SKIN GRAFT  1981   Social History Social History   Socioeconomic History  . Marital status: Married    Spouse name: Not on file  . Number of children: Not on file  . Years of education: Not on file  . Highest education level: Not on file  Occupational History  . Not on file  Social Needs  . Financial resource strain: Not on file  . Food insecurity:    Worry: Not on file    Inability: Not on file  . Transportation needs:    Medical: Not on file    Non-medical: Not on file  Tobacco Use  . Smoking status: Former Smoker    Packs/day: 0.50    Years: 40.00    Pack years: 20.00    Types: Cigarettes, Pipe, Cigars    Last attempt to quit: 05/11/1999    Years since quitting: 18.3  . Smokeless tobacco: Never Used  Substance and Sexual Activity  .  Alcohol use: No  . Drug use: No  . Sexual activity: Yes  Lifestyle  . Physical activity:    Days per week: Not on file    Minutes per session: Not on file  . Stress: Not on file  Relationships  . Social connections:    Talks on phone: Not on file    Gets together: Not on file    Attends religious service: Not on file    Active member of club or organization: Not on file    Attends meetings of clubs or organizations: Not on file    Relationship status: Not on file  . Intimate partner violence:    Fear of current or ex partner: Not on file    Emotionally abused: Not on file    Physically abused: Not on file    Forced sexual activity: Not on file  Other Topics Concern  . Not  on file  Social History Narrative   Married    Retired: [employer]   3 grown children   Family History Family History  Problem Relation Age of Onset  . Heart disease Father   . Heart attack Father   . ALS Mother   . Diabetes Sister   . Hypertension Sister   . Heart failure Brother   . Stroke Neg Hx     Current Outpatient Medications on File Prior to Visit  Medication Sig Dispense Refill  . atorvastatin (LIPITOR) 20 MG tablet Take 20 mg by mouth at bedtime.     . Dulaglutide (TRULICITY) 1.5 XT/0.2IO SOPN Please inject under skin 1.5 mg weekly (Patient taking differently: Inject 1.5 mg into the skin every Sunday. IN THE MORNING.) 12 pen 3  . ferrous sulfate 325 (65 FE) MG tablet Take 325 mg by mouth 3 (three) times a week.    Marland Kitchen HUMULIN 70/30 KWIKPEN (70-30) 100 UNIT/ML PEN INJECT SUBCUTANEOUSLY 20  UNITS BEFORE BREAKFAST AND  25 UNITS BEFORE DINNER 45 mL 3  . metoprolol tartrate (LOPRESSOR) 25 MG tablet TAKE 1 TABLET BY MOUTH TWO  TIMES DAILY (Patient taking differently: TAKE 1 TABLET (25 MG) BY MOUTH TWO TIMES DAILY) 180 tablet 2  . nitroGLYCERIN (NITROSTAT) 0.4 MG SL tablet Place 0.4 mg under the tongue every 5 (five) minutes as needed.    Marland Kitchen omeprazole (PRILOSEC) 20 MG capsule Take 20 mg by mouth daily as needed (for acid reflux/indigestion). Reported on 11/13/2015    . Probiotic Product (PROBIOTIC-10 PO) Take 1 capsule by mouth every other day.     . ramipril (ALTACE) 10 MG capsule Take 10 mg by mouth daily.    Marland Kitchen RELION PEN NEEDLES 32G X 4 MM MISC USE TWO TIMES A DAY AS ADVISED 100 each 7  . XARELTO 20 MG TABS tablet TAKE 1 TABLET BY MOUTH  DAILY (Patient taking differently: TAKE 1 TABLET BY MOUTH  DAILY IN THE EVENING.) 90 tablet 2   No current facility-administered medications on file prior to visit.    No Known Allergies  ROS: See HPI for pertinent positives and negatives.  Physical Examination  Vitals:   09/12/17 1000 09/12/17 1001  BP: (!) 144/89 (!) 143/87  Pulse: 72   Temp:  (!) 96.7 F (35.9 C)   TempSrc: Oral   SpO2: 98%   Weight: 223 lb 11.2 oz (101.5 kg)   Height: 6\' 3"  (1.905 m)    Body mass index is 27.96 kg/m.  General:A&O x 3, WD male.  Gait, slow, steady, using cane. HEENT: No gross  abnormalities  Pulmonary: Sym exp, respirations are non labored, good air movement in all fields, CTAB, no rales, rhonchi, orwheezing.  Cardiac: Irregular rhythm, controlled rate, no detected murmur   Carotid Bruits Right Left   Negative Negative   Abdominal aortic pulse is not palpable Radial pulses are 2+ palpable bilaterally                          VASCULAR EXAM:                                                                                                                                           LE Pulses Right Left       FEMORAL   palpable   palpable        POPLITEAL  3+ palpable   3+ palpable       POSTERIOR TIBIAL  not palpable   not palpable        DORSALIS PEDIS      ANTERIOR TIBIAL 2+ palpable  2+ palpable     Gastrointestinal: soft, NTND, -G/R, - HSM, - palpable masses, - CVAT B. Colostomy appliance LLQ, with soft stool.  Musculoskeletal: M/S 5/5 throughout, Extremities without ischemic changes, no peripheral edema.  Neurologic: CN 2-12 intact except he is moderately hard of hearing, Pain and light touch intact in extremities, Motor exam as listed above  Psychiatric: Normal thought content, mood appropriate to clinical situation.    DATA  AAA Duplex (09/12/2017):  Previous size:3.93 cm (Date: 09-07-16), Right CIA: 1.97 cm; Left CIA: not visualized   Current size:  3.9 cm at mid segment (Date: 09/12/17); Right CIA: 1.9 cm; Left CIA: 1.8 cm  Bilateral Popliteal Artery Duplex (09/12/17): Right CFA: 1.6 cm Left CFA: 1.5 cm Right popliteal artery: 1.2 cm Left popliteal artery: 1.0 cm All triphasic waveforms No evidence of bilateral popliteal artery aneurysm  ABI (Date: 09/12/2017):  R:   ABI: 1.27 (no previous),    PT: tri  DP: tri  TBI:  0.88  L:   ABI: 1.28 (no previous),   PT: tri  DP: tri  TBI: 1.05  Normal bilateral ABI and TBI with all triphasic waveforms.    Medical Decision Making  The patient is a 72 y.o. male who presents with asymptomatic AAA with no increase in size, at 3.9 cm today.   Based on this patient's exam and diagnostic studies, the patient will follow up in 1 year with AAA duplex.   Consideration for repair of AAA would be made when the size is 5.0 cm, growth > 1 cm/yr, and symptomatic status.  Consideration for repair of common iliac artery aneurysm would be made when the size is 3 cm or greater.         The patient was given information about AAA including signs, symptoms, treatment, and how to minimize the risk of  enlargement and rupture of aneurysms.    I emphasized the importance of maximal medical management including strict control of blood pressure, blood glucose, and lipid levels, antiplatelet agents, obtaining regular exercise, and continued cessation of smoking.   The patient was advised to call 911 should the patient experience sudden onset abdominal or back pain.   Thank you for allowing [place] to participate in this patient's care.  Clemon Chambers, RN, MSN, FNP-C [facility] [place] Office: [facility] Physician: Trula Slade  09/12/2017, 10:37 AM

## 2017-09-16 ENCOUNTER — Other Ambulatory Visit: Payer: Self-pay | Admitting: *Deleted

## 2017-09-16 NOTE — Patient Outreach (Signed)
[place] [facility]) Care Management  09/16/2017  [name] 01-25-1946 [id number]   [place] Monthly Outreach  Referral Date: 09/29/16 Referral Source: self referral Reason for Referral: Not able to afford prescriptions due to cost Insurance: [employer]   Outreach Attempt:  Outreach attempt #1 to patient for monthly follow up. No answer. RN Health Coach left HIPAA compliant voicemail message along with contact information.  Plan:  RN Health Coach will send unsuccessful outreach letter to patient.  RN Health Coach will make another outreach attempt to patient within 3-4 business days if no return call back from patient.  [place] [phone] [name]

## 2017-09-21 ENCOUNTER — Encounter: Payer: Self-pay | Admitting: *Deleted

## 2017-09-21 ENCOUNTER — Other Ambulatory Visit: Payer: Self-pay | Admitting: *Deleted

## 2017-09-21 NOTE — Patient Outreach (Addendum)
[place] [facility]) Care Management  09/21/2017  [name] May 30, 1945 [id number]   RN Health Coach Monthly Outreach  Referral Date: 09/29/16 Referral Source: self referral Reason for Referral: Not able to afford prescriptions due to cost Insurance: [employer]   Outreach Attempt:  Outreach attempt #2 to patient for monthly follow up. No answer. RN Health Coach left HIPAA compliant voicemail message along with contact information.  Patient's Hgb A1C is at goal.  [place] discussed with patient last month about quarterly outreaches.  Plan:  RN Health Coach will make next quarterly outreach to patient in the month of July after Endocrinology scheduled appointment if no return call from patient.  [place] [phone] [name]

## 2017-09-21 NOTE — Patient Outreach (Signed)
[place] [facility]) Care Management  09/21/2017  [name] [birth year] [id number]   RN Health Coach Monthly Outreach  Referral Date: 09/29/16 Referral Source: self referral Reason for Referral: Not able to afford prescriptions due to cost Insurance: [employer]   Outreach Attempt:  Received telephone call back from patient's wife.  HIPAA verified with wife per patient request.  States patient is doing well.  Denies any sick days.  Reports managing his colostomy without issues, states colostomy bag stay about 2-3 days.  Verbalizes he has had stoma assessed by Wound Nurse and Surgeon.  Hoping to get ostomy reversed in July.  Fasting blood sugar this morning was 141.  Denies any significant hypoglycemic or hyperglycemic events.  Discussed with patient nearing goals and quarterly telephone outreaches.  Patient verbally agrees.  Appointments:  Stating his yearly wellness visit for 09/09/2017 was cancelled and rescheduled.  Patient has appointment with primary care provider, Cyndi Bender, [place] on 10/13/2017.  Sees Dr. Cruzita Lederer with Endocrinology on 11/30/2017.  Encouraged to keep and attend medical appointments.  Plan:  RN Health Coach will make next quarterly outreach to patient in the month of July.   [place] [phone] [email or web address]

## 2017-09-23 DIAGNOSIS — K5732 Diverticulitis of large intestine without perforation or abscess without bleeding: Secondary | ICD-10-CM | POA: Diagnosis not present

## 2017-09-23 DIAGNOSIS — Z933 Colostomy status: Secondary | ICD-10-CM | POA: Diagnosis not present

## 2017-10-05 DIAGNOSIS — I4891 Unspecified atrial fibrillation: Secondary | ICD-10-CM | POA: Diagnosis not present

## 2017-10-05 DIAGNOSIS — Z86010 Personal history of colonic polyps: Secondary | ICD-10-CM | POA: Diagnosis not present

## 2017-10-05 DIAGNOSIS — K219 Gastro-esophageal reflux disease without esophagitis: Secondary | ICD-10-CM | POA: Diagnosis not present

## 2017-10-06 ENCOUNTER — Telehealth: Payer: Self-pay

## 2017-10-06 NOTE — Telephone Encounter (Signed)
   I will route this recommendation to the requesting party via Epic fax function and remove from pre-op pool.  Please call with questions.  [place], [place] 10/06/2017, 3:36 PM

## 2017-10-06 NOTE — Telephone Encounter (Signed)
Pt takes Xarelto for afib with CHADS2VASc score of 4 (age, HTN, CAD, DM). Renal function is normal. Ok to hold Xarelto the day prior to colonoscopy as requested.

## 2017-10-06 NOTE — Telephone Encounter (Signed)
   [facility] Pre-operative Risk Assessment    Request for surgical clearance:  1. What type of surgery is being performed? colonoscopy   2. When is this surgery scheduled? 10/26/17  3. What type of clearance is required (medical clearance vs. Pharmacy clearance to hold med vs. Both)? Pharmacy   4. Are there any medications that need to be held prior to surgery and how long? Can pt hold Xarelto right before procedure   5. Practice name and name of physician performing surgery?  [facility] Dr. Cristina Gong   6. What is your office phone number [phone]    7.   What is your office fax number  [phone]  8.   Anesthesia type (None, local, MAC, general) ? None specified    10/06/2017, 10:29 AM  _________________________________________________________________   (provider comments below)

## 2017-10-10 DIAGNOSIS — K5732 Diverticulitis of large intestine without perforation or abscess without bleeding: Secondary | ICD-10-CM | POA: Diagnosis not present

## 2017-10-10 DIAGNOSIS — Z933 Colostomy status: Secondary | ICD-10-CM | POA: Diagnosis not present

## 2017-10-13 DIAGNOSIS — I2510 Atherosclerotic heart disease of native coronary artery without angina pectoris: Secondary | ICD-10-CM | POA: Diagnosis not present

## 2017-10-13 DIAGNOSIS — Z0000 Encounter for general adult medical examination without abnormal findings: Secondary | ICD-10-CM | POA: Diagnosis not present

## 2017-10-13 DIAGNOSIS — Z125 Encounter for screening for malignant neoplasm of prostate: Secondary | ICD-10-CM | POA: Diagnosis not present

## 2017-10-13 DIAGNOSIS — N183 Chronic kidney disease, stage 3 (moderate): Secondary | ICD-10-CM | POA: Diagnosis not present

## 2017-10-13 DIAGNOSIS — Z139 Encounter for screening, unspecified: Secondary | ICD-10-CM | POA: Diagnosis not present

## 2017-10-13 DIAGNOSIS — D509 Iron deficiency anemia, unspecified: Secondary | ICD-10-CM | POA: Diagnosis not present

## 2017-10-13 DIAGNOSIS — Z136 Encounter for screening for cardiovascular disorders: Secondary | ICD-10-CM | POA: Diagnosis not present

## 2017-10-13 DIAGNOSIS — E782 Mixed hyperlipidemia: Secondary | ICD-10-CM | POA: Diagnosis not present

## 2017-10-24 DIAGNOSIS — K5732 Diverticulitis of large intestine without perforation or abscess without bleeding: Secondary | ICD-10-CM | POA: Diagnosis not present

## 2017-10-24 DIAGNOSIS — Z933 Colostomy status: Secondary | ICD-10-CM | POA: Diagnosis not present

## 2017-10-26 DIAGNOSIS — Z86010 Personal history of colonic polyps: Secondary | ICD-10-CM | POA: Diagnosis not present

## 2017-11-03 DIAGNOSIS — D692 Other nonthrombocytopenic purpura: Secondary | ICD-10-CM | POA: Diagnosis not present

## 2017-11-03 DIAGNOSIS — L570 Actinic keratosis: Secondary | ICD-10-CM | POA: Diagnosis not present

## 2017-11-03 DIAGNOSIS — L821 Other seborrheic keratosis: Secondary | ICD-10-CM | POA: Diagnosis not present

## 2017-11-10 ENCOUNTER — Other Ambulatory Visit: Payer: Self-pay | Admitting: Cardiovascular Disease

## 2017-11-11 NOTE — Telephone Encounter (Signed)
Age 72 years Wt 101.5 kg 09/12/2017 Saw Dr Angelena Form on 04/22/2017 06/15/2017 SrCr 1.14 Hgb 9.6 HCT 28.7 CrCl 84.08 Refill done for Xarelto 20 mg daily as requested

## 2017-11-14 DIAGNOSIS — N183 Chronic kidney disease, stage 3 (moderate): Secondary | ICD-10-CM | POA: Diagnosis not present

## 2017-11-24 DIAGNOSIS — K5732 Diverticulitis of large intestine without perforation or abscess without bleeding: Secondary | ICD-10-CM | POA: Diagnosis not present

## 2017-11-24 DIAGNOSIS — Z933 Colostomy status: Secondary | ICD-10-CM | POA: Diagnosis not present

## 2017-11-29 DIAGNOSIS — K56699 Other intestinal obstruction unspecified as to partial versus complete obstruction: Secondary | ICD-10-CM | POA: Diagnosis not present

## 2017-11-30 ENCOUNTER — Encounter: Payer: Self-pay | Admitting: Internal Medicine

## 2017-11-30 ENCOUNTER — Ambulatory Visit: Payer: Medicare Other | Admitting: Internal Medicine

## 2017-11-30 VITALS — BP 110/70 | HR 65 | Ht 75.0 in | Wt 226.0 lb

## 2017-11-30 DIAGNOSIS — E785 Hyperlipidemia, unspecified: Secondary | ICD-10-CM | POA: Diagnosis not present

## 2017-11-30 DIAGNOSIS — E1165 Type 2 diabetes mellitus with hyperglycemia: Secondary | ICD-10-CM | POA: Diagnosis not present

## 2017-11-30 DIAGNOSIS — IMO0002 Reserved for concepts with insufficient information to code with codable children: Secondary | ICD-10-CM

## 2017-11-30 DIAGNOSIS — E1151 Type 2 diabetes mellitus with diabetic peripheral angiopathy without gangrene: Secondary | ICD-10-CM

## 2017-11-30 LAB — POCT GLYCOSYLATED HEMOGLOBIN (HGB A1C): Hemoglobin A1C: 7.4 % — AB (ref 4.0–5.6)

## 2017-11-30 MED ORDER — INSULIN ISOPHANE & REGULAR (HUMAN 70-30)100 UNIT/ML KWIKPEN: PEN_INJECTOR | SUBCUTANEOUS | 3 refills | Status: DC

## 2017-11-30 MED ORDER — METFORMIN HCL 500 MG PO TABS
500.0000 mg | ORAL_TABLET | Freq: Two times a day (BID) | ORAL | 3 refills | Status: DC
Start: 2017-11-30 — End: 2017-12-05

## 2017-11-30 NOTE — Progress Notes (Signed)
Patient ID: [name], male   DOB: August 04, 1945, 72 y.o.   MRN: [id number]  HPI: [name] is a 72 y.o.-year-old male, returning for f/u for DM2, dx 1995, insulin-dependent, uncontrolled, with complications (CKD stage 3, CAD - AMI, s/p stent LAD 2001). Last visit 4 months ago.  He is here with his wife who offers part of the history, especially related to his blood sugars, insulin doses, and diet.  Before last visit, he had diverticulitis flares (sees Dr. Mervil Wacker Gong).  He ended up needing to have a colectomy in 05/2017 and had a colostomy bag.  He was feeling better after his colectomy, however, he still had bloating and liquid stoma output so we stopped his metformin-glipizide combination then to see if his stoma output would improve.  It did not improve.  He still has increased output but noticed that if he wakes up in the middle of the night and eats a banana and a glass of milk, this improves.  Last hemoglobin A1c was: Lab Results  Component Value Date   HGBA1C 5.5 05/31/2017   HGBA1C 6.3 04/08/2017   HGBA1C 6.8 01/06/2017   He is on on: - stopped 66/2947 - Trulicity 1.5 mg weekly  - Insulin 70/30:  20 units before breakfast  20 units before dinner He was on Tanzeum 50 mg weekly (started 09/2015) - >200$ per month! >> Trulicity 1.5 mg weekly  Pt checks his sugars -3X a day: - am: 92-147, 205, 211 >> 116-165 >> 92, 115-187 - 2h after b'fast: 131 >> n/c >> 223 >> n/c  - before lunch: 198, 215 >> n/c - 2h after lunch: 184, 231 >> 179-228 >> n/c - before dinner: 74-185, 202, 225 >> 108-150, 161 >> 152-244 (occas. after snack) - 2h after dinner:  n/c >> 176 >> 196 >> n/c - bedtime:129, 162 >> n/c >> 76, 181 >> 212 - nighttime:43-67, 156 >> 74-109 >> 90, 120 Lowest sugar was 43 at night (was on clear liquid diet) >>74 >> 59 at bedtime; he does not have hypoglycemia awareness! Highest sugar was 228 >> 181 >> 249.  Pt's meals are: - Breakfast: eggs, toast, ham/sausage or cereals +  milk - Lunch: sandwich, vegetables, meat - Dinner: seafood, meat, vegetables - Snacks: nabs  Reviewed labs from PCP:  04/13/2017: - Lipids: 124/65/39/72 - CMP normal, glucose 98, BUN/creatinine 10/1.3, GFR 55, potassium 3.4 (3.5-5.2), Ca 8.1 (8.6-10.2) - CBC with differential: low Hb 11.3 (13-17.7), low RBC 3.57 (4.14-5.8) - ferritin 167 - PSA 0.5  09/30/2015: - Hep C virus antibody <0.1 - Lipids: 126/68/44/68 - CMP normal, except glucose 209, BUN/creatinine 22/1.39, GFR 51, potassium 5.4 (3.5-5.2), CO2 17 (18-29) - CBC with differential normal - B12 vitamin 305 (65-465)  04/01/2015: - ACR 56.3 - CBC with differential normal - Lipids: 116/56/42/63 - CMP: Glucose 193, BUN/creatinine 21/1.23, GFR 60, LFTs normal - PSA 0.5    -+ CKD stage III- + see above: Lab Results  Component Value Date   BUN 11 06/15/2017   CREATININE 1.14 06/15/2017  On  ramipril. -+ HL; lipids - + see above: Lab Results  Component Value Date   CHOL 131 03/28/2014   HDL 47 03/28/2014   LDLCALC 68 03/28/2014   TRIG 82 03/28/2014  On Lipitor.  - last eye exam was in 05/2017: No DR - no numbness and tingling in his feet.   ROS: Constitutional: no weight gain/no weight loss, no fatigue, no subjective hyperthermia, no subjective hypothermia Eyes: no blurry vision,  no xerophthalmia ENT: no sore throat, no nodules palpated in throat, no dysphagia, no odynophagia, no hoarseness Cardiovascular: no CP/no SOB/no palpitations/no leg swelling Respiratory: no cough/no SOB/no wheezing Gastrointestinal: no N/no V/no D/no C/no acid reflux Musculoskeletal: no muscle aches/no joint aches Skin: no rashes, no hair loss Neurological: no tremors/no numbness/no tingling/no dizziness  I reviewed pt's medications, allergies, PMH, social hx, family hx, and changes were documented in the history of present illness. Otherwise, unchanged from my initial visit note.  Past Medical History:  Diagnosis Date  . AAA  (abdominal aortic aneurysm) ([place])   . Allergy   . Atrial fibrillation [facility]) October 2013   Xarelto started; duration unknown  . Cataract   . Chronic kidney disease    Stage III   . Coronary artery disease    post bare-metal stent to LAD in 2001  . Diabetes mellitus    type 2   . Hypertension   . Myocardial infarct ([place]) 2001  . Obesity    Past Surgical History:  Procedure Laterality Date  . CARDIAC CATHETERIZATION  2006   stable with primarily nonobstructive disease  . CARDIOVERSION  03/27/2012   Procedure: CARDIOVERSION;  Surgeon: Carlena Bjornstad, MD;  Location: [facility] ENDOSCOPY;  Service: Cardiovascular;  Laterality: N/A;  . CORONARY ANGIOPLASTY WITH STENT PLACEMENT  2001   bare-metal stent to the LAD  . CYSTOSCOPY WITH STENT PLACEMENT Bilateral 06/03/2017   Procedure: CYSTOSCOPY WITH STENT PLACEMENT;  Surgeon: Festus Aloe, MD;  Location: [facility] ORS;  Service: Urology;  Laterality: Bilateral;  . HERNIA REPAIR  2006  . LAPAROSCOPIC SIGMOID COLECTOMY N/A 06/03/2017   Procedure: LAPAROSCOPIC  SIGMOID COLECTOMY, COLOSTOMY;  Surgeon: Ileana Roup, MD;  Location: [facility] ORS;  Service: General;  Laterality: N/A;  . SKIN GRAFT  1981   Social History Main Topics  . Smoking status: Former Smoker -- 0.50 packs/day for 40 years    Types: Cigarettes, Pipe, Cigars    Quit date: 05/11/1999  . Smokeless tobacco: Never Used  . Alcohol Use: No  . Drug Use: No   Social History Narrative   Married    Retired: [employer]   3 grown children   Current Outpatient Medications on File Prior to Visit  Medication Sig Dispense Refill  . atorvastatin (LIPITOR) 20 MG tablet Take 20 mg by mouth at bedtime.     . Dulaglutide (TRULICITY) 1.5 ZW/2.5EN SOPN Please inject under skin 1.5 mg weekly (Patient taking differently: Inject 1.5 mg into the skin every Sunday. IN THE MORNING.) 12 pen 3  . ferrous sulfate 325 (65 FE) MG tablet Take 325 mg by mouth 3 (three) times a week.    Marland Kitchen HUMULIN 70/30 KWIKPEN (70-30)  100 UNIT/ML PEN INJECT SUBCUTANEOUSLY 20  UNITS BEFORE BREAKFAST AND  25 UNITS BEFORE DINNER 45 mL 3  . metoprolol tartrate (LOPRESSOR) 25 MG tablet TAKE 1 TABLET BY MOUTH TWO  TIMES DAILY (Patient taking differently: TAKE 1 TABLET (25 MG) BY MOUTH TWO TIMES DAILY) 180 tablet 2  . nitroGLYCERIN (NITROSTAT) 0.4 MG SL tablet Place 0.4 mg under the tongue every 5 (five) minutes as needed.    Marland Kitchen omeprazole (PRILOSEC) 20 MG capsule Take 20 mg by mouth daily as needed (for acid reflux/indigestion). Reported on 11/13/2015    . Probiotic Product (PROBIOTIC-10 PO) Take 1 capsule by mouth every other day.     . ramipril (ALTACE) 10 MG capsule Take 10 mg by mouth daily.    Marland Kitchen RELION PEN NEEDLES 32G X 4 MM  MISC USE TWO TIMES A DAY AS ADVISED 100 each 7  . XARELTO 20 MG TABS tablet TAKE 1 TABLET BY MOUTH  DAILY 90 tablet 1   No current facility-administered medications on file prior to visit.    No Known Allergies Family History  Problem Relation Age of Onset  . Heart disease Father   . Heart attack Father   . ALS Mother   . Diabetes Sister   . Hypertension Sister   . Heart failure Brother   . Stroke Neg Hx    PE: BP 110/70   Pulse 65   Ht 6\' 3"  (1.905 m)   Wt 226 lb (102.5 kg)   SpO2 95%   BMI 28.25 kg/m  Body mass index is 28.25 kg/m. Wt Readings from Last 3 Encounters:  11/30/17 226 lb (102.5 kg)  09/12/17 223 lb 11.2 oz (101.5 kg)  07/28/17 220 lb 12.8 oz (100.2 kg)   Constitutional: overweight, in NAD Eyes: PERRLA, EOMI, no exophthalmos ENT: moist mucous membranes, no thyromegaly, no cervical lymphadenopathy Cardiovascular: RRR, No MRG Respiratory: CTA B Gastrointestinal: abdomen soft, NT, ND, BS+ Musculoskeletal: no deformities, strength intact in all 4 Skin: moist, warm, no rashes Neurological: no tremor with outstretched hands, DTR normal in all 4  ASSESSMENT: 1. DM2, insulin-dependent, uncontrolled, with complications - CAD - CKD stage 3  2. HL  PLAN:  1. Patient with  long-standing, previously uncontrolled diabetes, now with much better control on GLP-1 receptor agonist and premixed insulin regimen.  He was getting Trulicity through the the patient assistant program, but now he has to pay full price for it.  At last visit, as he was having increased output through his colectomy stoma, we stopped metformin-glipizide combination.  At that time, he was not eating very well due to his previous diverticulitis episodes.  He also had some low blood sugars at night, in the 70s.  However, his output did not decrease afterwards. - At this visit, sugars are higher after we stopped the metformin and glipizide.  We discussed about adding a low-dose metformin, 500 mg with dinner for now.  I also advised him to increase his premixed insulin dose before breakfast, since sugars later in the day are higher.  I am worried that eating carbs at night will increase his sugars in the morning, but since this is greatly helping with his diarrhea, I advised him to continue to do that. - I suggested to:  Patient Instructions  Please continue: - Trulicity 1.5 mg weekly  Please increase:  - Insulin 70/30:  25 units before breakfast  20 units before dinner  Please add: - Metformin 500 mg with dinner  Please return in 4 months with your sugar log.   - today, HbA1c is 7.4% (higher) - continue checking sugars at different times of the day - check 2x a day, rotating checks - advised for yearly eye exams >> he is UTD - Return to clinic in 4 mo with sugar log      2. HL - Reviewed latest lipid panel per PCP records from 04/2017: LDL at goal - Continues Lipitor without side effects.  Philemon Kingdom, MD PhD [facility] Endocrinology

## 2017-11-30 NOTE — Addendum Note (Signed)
Addended by: Drucilla Schmidt on: 11/30/2017 01:41 PM   Modules accepted: Orders

## 2017-11-30 NOTE — Patient Instructions (Signed)
Please continue: - Trulicity 1.5 mg weekly  Please increase:  - Insulin 70/30:  25 units before breakfast  20 units before dinner  Please add: - Metformin 500 mg with dinner  Please return in 4 months with your sugar log.

## 2017-12-01 ENCOUNTER — Ambulatory Visit: Payer: Self-pay | Admitting: *Deleted

## 2017-12-05 ENCOUNTER — Other Ambulatory Visit: Payer: Self-pay | Admitting: *Deleted

## 2017-12-05 ENCOUNTER — Encounter: Payer: Self-pay | Admitting: *Deleted

## 2017-12-05 ENCOUNTER — Telehealth: Payer: Self-pay | Admitting: Internal Medicine

## 2017-12-05 ENCOUNTER — Other Ambulatory Visit: Payer: Self-pay | Admitting: Surgery

## 2017-12-05 ENCOUNTER — Other Ambulatory Visit: Payer: Self-pay

## 2017-12-05 DIAGNOSIS — Z933 Colostomy status: Secondary | ICD-10-CM

## 2017-12-05 MED ORDER — METFORMIN HCL 500 MG PO TABS: 500.0000 mg | ORAL_TABLET | Freq: Every day | ORAL | 3 refills | Status: DC

## 2017-12-05 NOTE — Patient Outreach (Signed)
[facility]) Care Management  [facility] Care Manager  12/05/2017   JEFFEREY LIPPMANN 14-Mar-1946 [id number]   [place] Quarterly Outreach   Referral Date: 09/29/16 Referral Source: self referral Reason for Referral: Not able to afford prescriptions due to cost Insurance: [employer]    Outreach Attempt:  Successful telephone outreach to patient for quarterly follow up.  HIPAA verified with patient's wife, per patient request.  States patient is doing well.  Hopes to have his colostomy reversed in September or October.  Continues to monitor his blood sugars.  Fasting blood sugar this morning was 173 with fasting ranges of 150-170's.  Current Hgb A1C is increased to 7.4 on 11/30/2017 and medications have been adjusted and added.  Reports appetite is good but continues to have frequent loose stools in his colostomy.  Discussed Quarterly outreaches and patient and wife verbally aggress. Encounter Medications:  Outpatient Encounter Medications as of 12/05/2017  Medication Sig Note  . atorvastatin (LIPITOR) 20 MG tablet Take 20 mg by mouth at bedtime.    . Dulaglutide (TRULICITY) 1.5 OI/7.1IW SOPN Please inject under skin 1.5 mg weekly (Patient taking differently: Inject 1.5 mg into the skin every Sunday. IN THE MORNING.)   . ferrous sulfate 325 (65 FE) MG tablet Take 325 mg by mouth 3 (three) times a week.   . Insulin Isophane & Regular Human (HUMULIN 70/30 KWIKPEN) (70-30) 100 UNIT/ML PEN INJECT SUBCUTANEOUSLY 25  UNITS BEFORE BREAKFAST AND  20 UNITS BEFORE DINNER   . metFORMIN (GLUCOPHAGE) 500 MG tablet Take 1 tablet (500 mg total) by mouth daily with breakfast.   . metoprolol tartrate (LOPRESSOR) 25 MG tablet TAKE 1 TABLET BY MOUTH TWO  TIMES DAILY (Patient taking differently: TAKE 1 TABLET (25 MG) BY MOUTH TWO TIMES DAILY) 12/05/2017: Reports taking 1/2 tablet twice a day per primary care instructions  . nitroGLYCERIN (NITROSTAT) 0.4 MG SL tablet Place 0.4 mg  under the tongue every 5 (five) minutes as needed.   Marland Kitchen omeprazole (PRILOSEC) 20 MG capsule Take 20 mg by mouth daily as needed (for acid reflux/indigestion). Reported on 11/13/2015   . Probiotic Product (PROBIOTIC-10 PO) Take 1 capsule by mouth every other day.    . ramipril (ALTACE) 10 MG capsule Take 10 mg by mouth daily.   Marland Kitchen RELION PEN NEEDLES 32G X 4 MM MISC USE TWO TIMES A DAY AS ADVISED   . XARELTO 20 MG TABS tablet TAKE 1 TABLET BY MOUTH  DAILY    No facility-administered encounter medications on file as of 12/05/2017.     Functional Status:  In your present state of health, do you have any difficulty performing the following activities: 06/14/2017 06/14/2017  Hearing? - -  Comment - -  Vision? - -  Difficulty concentrating or making decisions? - -  Walking or climbing stairs? - -  Dressing or bathing? - -  Doing errands, shopping? N N  Preparing Food and eating ? - -  Using the Toilet? - -  In the past six months, have you accidently leaked urine? - -  Do you have problems with loss of bowel control? - -  Managing your Medications? - -  Managing your Finances? - -  Housekeeping or managing your Housekeeping? - -  Some recent data might be hidden    Fall/Depression Screening: Fall Risk  12/05/2017 08/12/2017 06/01/2017  Falls in the past year? No No No   PHQ 2/9 Scores 03/02/2017 10/06/2016 10/01/2016 10/03/2014  PHQ - 2  Score 0 0 0 0    [facility] CM Care Plan Problem One     Most Recent Value  Care Plan Problem One  knowledge deficiet related to post operative care  Role Documenting the Problem One  [place] for Problem One  Active  [facility] Long Term Goal   Patient will decrease his Hgb A1C by 0.5 points in the next 90 days  [facility] Long Term Goal Start Date  12/05/17  Interventions for Problem One Long Term Goal  Reviewed and discussed current care plan and goal with patient and wife, reviewed medications and encouraged compliance, discussed healthier meal and drink options  with patient, encoruaged to keep and attend medical appointments, encouraged to continue to monitor blood sugars and record for physician review, encouraged to increase activity as tolerated     Appointments:  Reports seeing Gretel Acre roy PA on 6/6/20109 and should follow up with physician in January 2020.  Scheduled appointment with Dr. Mora Bellman on 03/29/2018.  Patient has scheduled barium enema next week.  Plan: RN Health Coach will make next quarterly outreach to patient in the month of October. RN Health Coach will send primary care provider Quarterly Update.  Edie [phone] [email or web address]

## 2017-12-05 NOTE — Telephone Encounter (Signed)
[name] would like a call back to clarify the instructions and dosage that patient should be taking   Please advise

## 2017-12-05 NOTE — Telephone Encounter (Signed)
Went over Metformin dosage. Pts wife is aware.

## 2017-12-09 ENCOUNTER — Encounter

## 2017-12-09 ENCOUNTER — Encounter: Payer: Self-pay | Admitting: Cardiovascular Disease

## 2017-12-09 ENCOUNTER — Ambulatory Visit: Payer: Medicare Other | Admitting: Cardiovascular Disease

## 2017-12-09 ENCOUNTER — Encounter: Payer: Self-pay | Admitting: *Deleted

## 2017-12-09 VITALS — BP 90/70 | HR 73 | Ht 75.0 in | Wt 224.6 lb

## 2017-12-09 DIAGNOSIS — I10 Essential (primary) hypertension: Secondary | ICD-10-CM

## 2017-12-09 DIAGNOSIS — Z01810 Encounter for preprocedural cardiovascular examination: Secondary | ICD-10-CM

## 2017-12-09 DIAGNOSIS — I4819 Other persistent atrial fibrillation: Secondary | ICD-10-CM

## 2017-12-09 DIAGNOSIS — I2510 Atherosclerotic heart disease of native coronary artery without angina pectoris: Secondary | ICD-10-CM | POA: Diagnosis not present

## 2017-12-09 DIAGNOSIS — I481 Persistent atrial fibrillation: Secondary | ICD-10-CM | POA: Diagnosis not present

## 2017-12-09 NOTE — Progress Notes (Signed)
Chief Complaint  Patient presents with  . Follow-up    CAD    History of Present Illness: 72 yo male with history of CAD, HTN, hyperlipidemia, persistent atrial fibrillation, AAA and DM who is here today for cardiac follow up. In 2001 he had an anterior STEMI treated with a bare-metal stent in the LAD. He underwent catheterization in 2006 and the stent was patent and there was an 80% narrowing in the diagonal branch which was treated medically. He was noted to be in atrial fibrillation in October 2013 and was started on Xarelto and DCCV was arranged on 03/27/12 with return to sinus rhythm. He has since converted back to atrial fibrillation and has been controlled on metoprolol.  AAA followed in [facility]. Echo July 2018 with normal LV size and function with no significant valve disease. 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 He has a colostomy.   He is here today for follow up. The patient denies any chest pain, dyspnea, palpitations, lower extremity edema, orthopnea, PND, dizziness, near syncope or syncope.    Primary Care Physician: Cyndi Bender, PA-C  Past Medical History:  Diagnosis Date  . AAA (abdominal aortic aneurysm) ([place])   . Allergy   . Atrial fibrillation [facility]) October 2013   Xarelto started; duration unknown  . Cataract   . Chronic kidney disease    Stage III   . Coronary artery disease    post bare-metal stent to LAD in 2001  . Diabetes mellitus    type 2   . Hypertension   . Myocardial infarct ([place]) 2001  . Obesity     Past Surgical History:  Procedure Laterality Date  . CARDIAC CATHETERIZATION  2006   stable with primarily nonobstructive disease  . CARDIOVERSION  03/27/2012   Procedure: CARDIOVERSION;  Surgeon: Carlena Bjornstad, MD;  Location: [facility] ENDOSCOPY;  Service: Cardiovascular;  Laterality: N/A;  . CORONARY ANGIOPLASTY WITH STENT PLACEMENT  2001   bare-metal stent to the LAD  . CYSTOSCOPY WITH STENT PLACEMENT Bilateral  06/03/2017   Procedure: CYSTOSCOPY WITH STENT PLACEMENT;  Surgeon: Festus Aloe, MD;  Location: [facility] ORS;  Service: Urology;  Laterality: Bilateral;  . HERNIA REPAIR  2006  . LAPAROSCOPIC SIGMOID COLECTOMY N/A 06/03/2017   Procedure: LAPAROSCOPIC  SIGMOID COLECTOMY, COLOSTOMY;  Surgeon: Ileana Roup, MD;  Location: [facility] ORS;  Service: General;  Laterality: N/A;  . SKIN GRAFT  1981    Current Outpatient Medications  Medication Sig Dispense Refill  . atorvastatin (LIPITOR) 20 MG tablet Take 20 mg by mouth at bedtime.     . Dulaglutide (TRULICITY) 1.5 KG/2.5KY SOPN Please inject under skin 1.5 mg weekly (Patient taking differently: Inject 1.5 mg into the skin every Sunday. IN THE MORNING.) 12 pen 3  . ferrous sulfate 325 (65 FE) MG tablet Take 325 mg by mouth 3 (three) times a week.    . Insulin Isophane & Regular Human (HUMULIN 70/30 KWIKPEN) (70-30) 100 UNIT/ML PEN INJECT SUBCUTANEOUSLY 25  UNITS BEFORE BREAKFAST AND  20 UNITS BEFORE DINNER 45 mL 3  . metFORMIN (GLUCOPHAGE) 500 MG tablet Take 1 tablet (500 mg total) by mouth daily with breakfast. 90 tablet 3  . metoprolol tartrate (LOPRESSOR) 25 MG tablet Take 25 mg by mouth 2 (two) times daily. Take 12.5 mg 1/2 half tablet by mouth twice a day for a total of 25 mg    . nitroGLYCERIN (NITROSTAT) 0.4 MG SL tablet Place 0.4 mg under  the tongue every 5 (five) minutes as needed.    Marland Kitchen omeprazole (PRILOSEC) 20 MG capsule Take 20 mg by mouth daily as needed (for acid reflux/indigestion). Reported on 11/13/2015    . Probiotic Product (PROBIOTIC-10 PO) Take 1 capsule by mouth every other day.     . ramipril (ALTACE) 10 MG capsule Take 10 mg by mouth daily.    Marland Kitchen RELION PEN NEEDLES 32G X 4 MM MISC USE TWO TIMES A DAY AS ADVISED 100 each 7  . XARELTO 20 MG TABS tablet TAKE 1 TABLET BY MOUTH  DAILY 90 tablet 1   No current facility-administered medications for this visit.     No Known Allergies  Social History   Socioeconomic History  . Marital  status: Married    Spouse name: Not on file  . Number of children: Not on file  . Years of education: Not on file  . Highest education level: Not on file  Occupational History  . Not on file  Social Needs  . Financial resource strain: Not on file  . Food insecurity:    Worry: Not on file    Inability: Not on file  . Transportation needs:    Medical: Not on file    Non-medical: Not on file  Tobacco Use  . Smoking status: Former Smoker    Packs/day: 0.50    Years: 40.00    Pack years: 20.00    Types: Cigarettes, Pipe, Cigars    Last attempt to quit: 05/11/1999    Years since quitting: 18.5  . Smokeless tobacco: Never Used  Substance and Sexual Activity  . Alcohol use: No  . Drug use: No  . Sexual activity: Yes  Lifestyle  . Physical activity:    Days per week: Not on file    Minutes per session: Not on file  . Stress: Not on file  Relationships  . Social connections:    Talks on phone: Not on file    Gets together: Not on file    Attends religious service: Not on file    Active member of club or organization: Not on file    Attends meetings of clubs or organizations: Not on file    Relationship status: Not on file  . Intimate partner violence:    Fear of current or ex partner: Not on file    Emotionally abused: Not on file    Physically abused: Not on file    Forced sexual activity: Not on file  Other Topics Concern  . Not on file  Social History Narrative   Married    Retired: [employer]   3 grown children    Family History  Problem Relation Age of Onset  . Heart disease Father   . Heart attack Father   . ALS Mother   . Diabetes Sister   . Hypertension Sister   . Heart failure Brother   . Stroke Neg Hx     Review of Systems:  As stated in the HPI and otherwise negative.   BP 90/70   Pulse 73   Ht 6\' 3"  (1.905 m)   Wt 224 lb 9.6 oz (101.9 kg)   SpO2 98%   BMI 28.07 kg/m   Physical Examination:  General: Well developed, well nourished, NAD  HEENT: OP  clear, mucus membranes moist  SKIN: warm, dry. No rashes. Neuro: No focal deficits  Musculoskeletal: Muscle strength 5/5 all ext  Psychiatric: Mood and affect normal  Neck: No JVD, no carotid bruits,  no thyromegaly, no lymphadenopathy.  Lungs:Clear bilaterally, no wheezes, rhonci, crackles Cardiovascular: Regular rate and rhythm. No murmurs, gallops or rubs. Abdomen:Soft. Bowel sounds present. Non-tender.  Extremities: No lower extremity edema. Pulses are 2 + in the bilateral DP/PT.  Echo 11/17/16: .- Left ventricle: The cavity size was normal. There was moderate   concentric hypertrophy. Systolic function was normal. The   estimated ejection fraction was in the range of 50% to 55%. Mild   hypokinesis of the inferoseptal and apical inferior myocardium. - Aortic valve: Transvalvular velocity was within the normal range.   There was no stenosis. There was no regurgitation. - Mitral valve: Transvalvular velocity was within the normal range.   There was no evidence for stenosis. There was no regurgitation. - Left atrium: The atrium was severely dilated. - Right ventricle: The cavity size was normal. Wall thickness was   normal. Systolic function was normal. - Right atrium: The atrium was severely dilated. - Atrial septum: No defect or patent foramen ovale was identified   by color flow Doppler. - Tricuspid valve: There was mild regurgitation. - Pulmonary arteries: Systolic pressure was within the normal   range. PA peak pressure: 35 mm Hg (S).  EKG:  EKG is not ordered today. The ekg ordered today demonstrates  Recent Labs: 05/31/2017: ALT 13 06/07/2017: Magnesium 1.9 06/15/2017: BUN 11; Creatinine, Ser 1.14; Hemoglobin 9.6; Platelets 214; Potassium 4.3; Sodium 138   Lipid Panel Followed in primary care   Wt Readings from Last 3 Encounters:  12/09/17 224 lb 9.6 oz (101.9 kg)  11/30/17 226 lb (102.5 kg)  09/12/17 223 lb 11.2 oz (101.5 kg)     Other studies Reviewed: Additional  studies/ records that were reviewed today include:  Review of the above records demonstrates:  Assessment and Plan:   1. CAD without angina: No chest pain. Will continue beta blocker and statin. No ASA since he is on Xarelto. Echo July 2018 with normal LV size and function.   2. Atrial fibrillation, persistent: He is in atrial fibrillation today. Rate is well controlled. Will continue the beta blocker and Xarelto.    3. HYPERTENSION: BP is well controlled. No changes.   4. Pre-operative cardiovascular examination: He is doing well. He is having no chest pain or dyspnea. It has been many years since he has had stress testing. With upcoming surgical procedure, will arrange a pharmacological nuclear stress test prior to his surgery.   Current medicines are reviewed at length with the patient today.  The patient does not have concerns regarding medicines.  The following changes have been made:  no change  Labs/ tests ordered today include:  Orders Placed This Encounter  Procedures  . MYOCARDIAL PERFUSION IMAGING    Disposition:   FU with me in 6 months  Signed, Lauree Chandler, MD 12/09/2017 12:04 PM    [address], [place], [place]  [address] Phone: [phone]; Fax: [phone]

## 2017-12-09 NOTE — Patient Instructions (Signed)
Medication Instructions:  Your physician recommends that you continue on your current medications as directed. Please refer to the Current Medication list given to you today.   Labwork: none  Testing/Procedures: Your physician has requested that you have a lexiscan myoview. For further information please visit [email or web address]. Please follow instruction sheet, as given.    Follow-Up: Your physician recommends that you schedule a follow-up appointment in: 6 months. Please call our office in about 2 months to schedule this appointment    Any Other Special Instructions Will Be Listed Below (If Applicable).     If you need a refill on your cardiac medications before your next appointment, please call your pharmacy.

## 2017-12-13 ENCOUNTER — Ambulatory Visit
Admission: RE | Admit: 2017-12-13 | Discharge: 2017-12-13 | Disposition: A | Discharge status: Home / Self Care | Payer: Medicare Other | Source: Ambulatory Visit | Attending: Surgery | Admitting: Surgery

## 2017-12-13 DIAGNOSIS — K5732 Diverticulitis of large intestine without perforation or abscess without bleeding: Secondary | ICD-10-CM | POA: Diagnosis not present

## 2017-12-13 DIAGNOSIS — Z933 Colostomy status: Secondary | ICD-10-CM

## 2017-12-15 ENCOUNTER — Telehealth (HOSPITAL_COMMUNITY): Payer: Self-pay | Admitting: *Deleted

## 2017-12-15 NOTE — Telephone Encounter (Signed)
Left message on voicemail per DPR in reference to upcoming appointment scheduled on 12/21/17 with detailed instructions given per Myocardial Perfusion Study Information Sheet for the test. LM to arrive 15 minutes early, and that it is imperative to arrive on time for appointment to keep from having the test rescheduled. If you need to cancel or reschedule your appointment, please call the office within 24 hours of your appointment. Failure to do so may result in a cancellation of your appointment, and a $50 no show fee. Phone number given for call back for any questions. Kirstie Peri

## 2017-12-21 ENCOUNTER — Ambulatory Visit (HOSPITAL_COMMUNITY): Payer: Medicare Other | Attending: Cardiovascular Disease

## 2017-12-21 DIAGNOSIS — Z01818 Encounter for other preprocedural examination: Secondary | ICD-10-CM | POA: Diagnosis not present

## 2017-12-21 DIAGNOSIS — I2510 Atherosclerotic heart disease of native coronary artery without angina pectoris: Secondary | ICD-10-CM | POA: Diagnosis not present

## 2017-12-21 DIAGNOSIS — Z01810 Encounter for preprocedural cardiovascular examination: Secondary | ICD-10-CM

## 2017-12-21 DIAGNOSIS — I252 Old myocardial infarction: Secondary | ICD-10-CM | POA: Diagnosis not present

## 2017-12-21 LAB — MYOCARDIAL PERFUSION IMAGING
CHL CUP NUCLEAR SDS: 3
CHL CUP NUCLEAR SRS: 15
CHL CUP NUCLEAR SSS: 18
CHL CUP RESTING HR STRESS: 75 {beats}/min
CSEPPHR: 96 {beats}/min
LV dias vol: 127 mL (ref 62–150)
LVSYSVOL: 63 mL
RATE: 0.36
TID: 1.02

## 2017-12-21 MED ORDER — TECHNETIUM TC 99M TETROFOSMIN IV KIT
30.1000 | PACK | Freq: Once | INTRAVENOUS | Status: AC | PRN
  Administered 2017-12-21: 30.1 via INTRAVENOUS
  Filled 2017-12-21: qty 31

## 2017-12-21 MED ORDER — REGADENOSON 0.4 MG/5ML IV SOLN
0.4000 mg | Freq: Once | INTRAVENOUS | Status: AC
  Administered 2017-12-21: 0.4 mg via INTRAVENOUS

## 2017-12-21 MED ORDER — TECHNETIUM TC 99M TETROFOSMIN IV KIT
10.3000 | PACK | Freq: Once | INTRAVENOUS | Status: AC | PRN
  Administered 2017-12-21: 10.3 via INTRAVENOUS
  Filled 2017-12-21: qty 11

## 2018-01-06 ENCOUNTER — Other Ambulatory Visit: Payer: Self-pay | Admitting: *Deleted

## 2018-01-06 MED ORDER — RIVAROXABAN 20 MG PO TABS: 20.0000 mg | ORAL_TABLET | Freq: Every day | ORAL | 1 refills | Status: DC

## 2018-01-06 NOTE — Telephone Encounter (Signed)
Pt last saw Dr Angelena Form 12/09/17, last labs 06/15/17 Creat 1.14, age 72, weight 101.9kg, based on CrCl pt is on appropriate dosage of Xarelto 20mg  QD.  Will refill rx.

## 2018-01-06 NOTE — Telephone Encounter (Signed)
[facility] pharmacy requesting short term xarelto rx to last until patients receives mail order shipment.

## 2018-01-10 ENCOUNTER — Ambulatory Visit: Payer: Self-pay | Admitting: Surgery

## 2018-01-10 DIAGNOSIS — K56699 Other intestinal obstruction unspecified as to partial versus complete obstruction: Secondary | ICD-10-CM | POA: Diagnosis not present

## 2018-01-10 NOTE — H&P (Signed)
CC: Postop f/u  HPI: Underwent lap Hartmann's for a significant diverticular stricture 06/03/17 with size mismatch. Path returned benign and this was discussed with the patient and his wife. Recovered well from surgery and was restarted on his Xarelto. He was readmitted 06/14/17 with bleeding from likely the skin around the stoma. This was cauterized with silver nitrate and stopped. He was admitted for 1 day for observation and his hemoglobin remained stable. He was discharged 06/15/17. Since that time he has been doing reasonably well. His stoma has been productive green/brown stool. He denies fever/chills/nausea/vomiting. He denies abominal pain or issues with constipation.  He underwent colonoscopy with Dr. Cristina Gong 10/26/17 - Hartmann's pouch noted at 20cm length and mild diversion proctopathy. Scattered medium-mouthed diverticula throughout entire colon GGE 12/13/17 normal appearing rectal vault and distal sigmoid. One subcentimeter diverticulum was visible at junciton of mid and distal sigmoid  He is interested in stoma reversal. He does have some issues with application of appliance 2/2 medium sized parastomal hernia.   PMH: DM (controlled on insulin), HTN, HLD, CAD (s/p stent 2001), AFib on Xarelto  PSH: Umbilical/ventral hernia repair with Goretex mesh remotely  FHx: Denies FHx of malignancy  Social: Denies use of tobacco/EtOH/drugs  ROS: A comprehensive 10 system review of systems was completed with the patient and pertinent findings as noted above.  The patient is a 72 year old male.    Review of Systems [name] Gratia Disla MD; 01/10/2018 9:48 AM) General Not Present- Chills and Fever. Skin Not Present- Coarse Hair and Cracked Lips. Respiratory Not Present- Cough and Difficulty Breathing on Exertion. Cardiovascular Not Present- Chest Pain and Shortness of Breath. Gastrointestinal Not Present- Nausea and Vomiting. Male Genitourinary Not Present- Change In Bladder Habits and  Change in Urinary Stream. Musculoskeletal Not Present- Arm Weakness and Claudication. Neurological Not Present- Decreased Memory and Difficulty Speaking. Psychiatric Not Present- Anxiety and Depression. Hematology Present- Blood Thinners. Not Present- Abnormal Bleeding.  Vitals Dalbert Mayotte CMA; 01/10/2018 9:25 AM) 01/10/2018 9:25 AM Weight: 224.8 lb Height: 75in Body Surface Area: 2.31 m Body Mass Index: 28.1 kg/m  Temp.: 97.48F  Pulse: 88 (Regular)  BP: 110/68 (Sitting, Left Arm, Standard)       Physical Exam [name] Luceil Herrin MD; 01/10/2018 9:49 AM) The physical exam findings are as follows: Note:Constitutional: No acute distress; conversant; no deformities Eyes: Moist conjunctiva; no lid lag; anicteric sclerae; pupils equal round and reactive to light Neck: Trachea midline; no palpable thyromegaly Lungs: Normal respiratory effort; no tactile fremitus CV: Regular rate and rhythm; no palpable thrill; no pitting edema GI: Abdomen soft, nontender, nondistended; no palpable hepatosplenomegaly. Colostomy left abdomen pink with stool in appliance. Medium sized reducible parastomal hernia MSK: Normal gait; no clubbing/cyanosis Psychiatric: Appropriate affect; alert and oriented 3 Lymphatic: No palpable cervical or axillary lymphadenopathy    Assessment & Plan [name] Swanson Farnell MD; 01/10/2018 9:52 AM) Cathie Olden STRICTURE [phone]) Story: Mr. Raczkowski is a very pleasant 72yo gentleman with hx of HTN, HLD, hypothyroidism, CAD, AFib on Xarelto - s/p lap Hartmann's for significant size mismatch and thickening of his colon from his stricture - 06/03/17; readmitted 2/5 for skin site bleeding. Recovering well at this time. Pathology benign Impression: -Colonoscopy noted medium diverticulosis scattered throughout colon -GGE clear -Cardiac clearance obtained - noted old scar but no further plans that would alter optimization per cardiology. He was instructed to hold his  Xarelto at least 48hrs prior to surgery. -After going over everything and options including life with a colostomy, he has  requested colostomy reversal. -We will plan laparoscopic vs open Hartmann's reversal, flexible sigmoidoscopy, possible takedown of splenic flexure, repair of parastomal hernia, cystoscopy/stents (by urology). -The anatomy and physiology of the GI tract was discussed at length with the patient with associated pictures. -The planned procedure, material risks (including, but not limited to, pain, bleeding, infection, scarring, need for blood transfusion, damage to surrounding structures- blood vessels/nerves/viscus/organs, damage to ureter, urine leak, leak from anastomosis, need for additional procedures, need for stoma which may be permanent, recurrence of hernia, recurrence of diverticulitis, pneumonia, heart attack, stroke, death) benefits and alternatives to surgery were discussed at length. The patient's questions were answered to his satisfaction, he voiced understanding and elected to proceed with surgery. Additionally, we discussed typical postoperative expectations and the recovery process.  Signed electronically by Ileana Roup, MD (01/10/2018 9:53 AM)

## 2018-01-16 DIAGNOSIS — Z933 Colostomy status: Secondary | ICD-10-CM | POA: Diagnosis not present

## 2018-01-16 DIAGNOSIS — K5732 Diverticulitis of large intestine without perforation or abscess without bleeding: Secondary | ICD-10-CM | POA: Diagnosis not present

## 2018-01-17 DIAGNOSIS — Z933 Colostomy status: Secondary | ICD-10-CM | POA: Diagnosis not present

## 2018-01-17 DIAGNOSIS — K5732 Diverticulitis of large intestine without perforation or abscess without bleeding: Secondary | ICD-10-CM | POA: Diagnosis not present

## 2018-02-02 ENCOUNTER — Other Ambulatory Visit: Payer: Self-pay | Admitting: Internal Medicine

## 2018-02-14 ENCOUNTER — Other Ambulatory Visit: Payer: Self-pay

## 2018-02-14 NOTE — Patient Outreach (Signed)
[facility]) Care Management  02/14/2018  [name] [birth year] [id number]   Medication Adherence call to [name] spoke with patient's wife she said patient does not take on a regular basis and his cholesterol is good doctor is aware of it. patient is due on Atorvastatin 20 mg. [name] is showing past due under [place].   [place] Management Direct Dial [phone]  Fax [phone] [email or web address]

## 2018-02-20 ENCOUNTER — Other Ambulatory Visit: Payer: Self-pay | Admitting: *Deleted

## 2018-02-20 NOTE — Patient Outreach (Signed)
[facility]) Care Management  02/20/2018  [name] 04/18/1946 [id number]   [place] Quarterly Outreach  Referral Date: 09/29/16 Referral Source: self referral Reason for Referral: Not able to afford prescriptions due to cost Insurance: [employer]    Outreach Attempt:  Outreach attempt #1 to patient for quarterly follow up. No answer. RN Health Coach left HIPAA compliant voicemail message along with contact information.  Plan:  RN Health Coach will attempt another telephone outreach to patient within the month of October.  [place] [phone] [email or web address]

## 2018-02-22 DIAGNOSIS — Z933 Colostomy status: Secondary | ICD-10-CM | POA: Diagnosis not present

## 2018-02-22 DIAGNOSIS — K5732 Diverticulitis of large intestine without perforation or abscess without bleeding: Secondary | ICD-10-CM | POA: Diagnosis not present

## 2018-02-23 NOTE — Progress Notes (Signed)
  12/21/2017- noted in Epic-Stress test  12/09/2017- noted in [place], office note from Dr. Angelena Form  04/22/2017- noted in Epic- EKG  11/17/2016- noted in Epic-ECHO

## 2018-02-23 NOTE — Patient Instructions (Addendum)
[name]  02/23/2018   Your procedure is scheduled on: Wednesday 03/08/2018  Report to [facility] Main  Entrance              Report to admitting at  [place]   AM    Call this number if you have problems the morning of surgery [phone]    Remember: Do not eat food  :After Midnight on Monday 03/06/2018!               Follow Bowel prep instructions from Dr. Dema Severin and a clear liquid diet all day the day before surgery on Tuesday 03/07/2018.              NO SOLID FOOD AFTER MIDNIGHT THE NIGHT PRIOR TO SURGERY. NOTHING BY MOUTH EXCEPT CLEAR LIQUIDS UNTIL 3 HOURS PRIOR TO [place] SURGERY. PLEASE FINISH ENSURE DRINK PER SURGEON ORDER 3 HOURS PRIOR TO SCHEDULED SURGERY TIME WHICH NEEDS TO BE COMPLETED AT  0430 am.    CLEAR LIQUID DIET   Foods Allowed                                                                     Foods Excluded  Coffee and tea, regular and decaf                             liquids that you cannot  Plain Jell-O in any flavor                                             see through such as: Fruit ices (not with fruit pulp)                                     milk, soups, orange juice  Iced Popsicles                                    All solid food Carbonated beverages, regular and diet                                    Cranberry, grape and apple juices Sports drinks like Gatorade Lightly seasoned clear broth or consume(fat free) Sugar, honey syrup  Sample Menu Breakfast                                Lunch                                     Supper Cranberry juice                    Beef broth  Chicken broth Jell-O                                     Grape juice                           Apple juice Coffee or tea                        Jell-O                                      Popsicle                                                Coffee or tea                        Coffee or  tea  _____________________________________________________________________  How to Manage Your Diabetes Before and After Surgery  Why is it important to control my blood sugar before and after surgery? . Improving blood sugar levels before and after surgery helps healing and can limit problems. . A way of improving blood sugar control is eating a healthy diet by: o  Eating less sugar and carbohydrates o  Increasing activity/exercise o  Talking with your doctor about reaching your blood sugar goals . High blood sugars (greater than 180 mg/dL) can raise your risk of infections and slow your recovery, so you will need to focus on controlling your diabetes during the weeks before surgery. . Make sure that the doctor who takes care of your diabetes knows about your planned surgery including the date and location.  How do I manage my blood sugar before surgery? . Check your blood sugar at least 4 times a day, starting 2 days before surgery, to make sure that the level is not too high or low. o Check your blood sugar the morning of your surgery when you wake up and every 2 hours until you get to the Short Stay unit. . If your blood sugar is less than 70 mg/dL, you will need to treat for low blood sugar: o Do not take insulin. o Treat a low blood sugar (less than 70 mg/dL) with  cup of clear juice (cranberry or apple), 4 glucose tablets, OR glucose gel. o Recheck blood sugar in 15 minutes after treatment (to make sure it is greater than 70 mg/dL). If your blood sugar is not greater than 70 mg/dL on recheck, call [phone] for further instructions. . Report your blood sugar to the short stay nurse when you get to Short Stay.  . If you are admitted to the hospital after surgery: o Your blood sugar will be checked by the staff and you will probably be given insulin after surgery (instead of oral diabetes medicines) to make sure you have good blood sugar levels. o The goal for blood sugar control  after surgery is 80-180 mg/dL.   WHAT DO I DO ABOUT MY DIABETES MEDICATION?        The day before surgery, Take Metformin as usual.         The day  before surgery in the morning, take 17 units of Humulin 70/30 insulin!  . THE NIGHT BEFORE SURGERY, take  14    units of  Humulin 70/30      insulin.       . THE MORNING OF SURGERY, take  0  units of  Humulin 70/30        Insulin.  . The morning of surgery, Do Not take any Diabetic medications!  . The day of surgery, do not take other diabetes injectables, including Byetta (exenatide), Bydureon (exenatide ER), Victoza (liraglutide), or Trulicity (dulaglutide).                   BRUSH YOUR TEETH MORNING OF SURGERY AND RINSE YOUR MOUTH OUT, NO CHEWING GUM CANDY OR MINTS.     Take these medicines the morning of surgery with A SIP OF WATER: Metoprolol Tartrate (Lopressor)               DO NOT TAKE ANY DIABETIC MEDICATIONS DAY OF YOUR SURGERY!                               You may not have any metal on your body including hair pins and              piercings  Do not wear jewelry, make-up, lotions, powders or perfumes, deodorant                          Men may shave face and neck.   Do not bring valuables to the hospital. [name].  Contacts, dentures or bridgework may not be worn into surgery.  Leave suitcase in the car. After surgery it may be brought to your room.                  Please read over the following fact sheets you were given: _____________________________________________________________________             [facility] - Preparing for Surgery Before surgery, you can play an important role.  Because skin is not sterile, your skin needs to be as free of germs as possible.  You can reduce the number of germs on your skin by washing with CHG (chlorahexidine gluconate) soap before surgery.  CHG is an antiseptic cleaner which kills germs and bonds with the skin to  continue killing germs even after washing. Please DO NOT use if you have an allergy to CHG or antibacterial soaps.  If your skin becomes reddened/irritated stop using the CHG and inform your nurse when you arrive at Short Stay. Do not shave (including legs and underarms) for at least 48 hours prior to the first CHG shower.  You may shave your face/neck. Please follow these instructions carefully:  1.  Shower with CHG Soap the night before surgery and the  morning of Surgery.  2.  If you choose to wash your hair, wash your hair first as usual with your  normal  shampoo.  3.  After you shampoo, rinse your hair and body thoroughly to remove the  shampoo.                           4.  Use CHG as you would any other liquid soap.  You can apply chg directly  to the skin and wash                       Gently with a scrungie or clean washcloth.  5.  Apply the CHG Soap to your body ONLY FROM THE NECK DOWN.   Do not use on face/ open                           Wound or open sores. Avoid contact with eyes, ears mouth and genitals (private parts).                       Wash face,  Genitals (private parts) with your normal soap.             6.  Wash thoroughly, paying special attention to the area where your surgery  will be performed.  7.  Thoroughly rinse your body with warm water from the neck down.  8.  DO NOT shower/wash with your normal soap after using and rinsing off  the CHG Soap.                9.  Pat yourself dry with a clean towel.            10.  Wear clean pajamas.            11.  Place clean sheets on your bed the night of your first shower and do not  sleep with pets. Day of Surgery : Do not apply any lotions/deodorants the morning of surgery.  Please wear clean clothes to the hospital/surgery center.  FAILURE TO FOLLOW THESE INSTRUCTIONS MAY RESULT IN THE CANCELLATION OF YOUR SURGERY PATIENT SIGNATURE_________________________________  NURSE  SIGNATURE__________________________________  ________________________________________________________________________   Adam Phenix  An incentive spirometer is a tool that can help keep your lungs clear and active. This tool measures how well you are filling your lungs with each breath. Taking long deep breaths may help reverse or decrease the chance of developing breathing (pulmonary) problems (especially infection) following:  A long period of time when you are unable to move or be active. BEFORE THE PROCEDURE   If the spirometer includes an indicator to show your best effort, your nurse or respiratory therapist will set it to a desired goal.  If possible, sit up straight or lean slightly forward. Try not to slouch.  Hold the incentive spirometer in an upright position. INSTRUCTIONS FOR USE  1. Sit on the edge of your bed if possible, or sit up as far as you can in bed or on a chair. 2. Hold the incentive spirometer in an upright position. 3. Breathe out normally. 4. Place the mouthpiece in your mouth and seal your lips tightly around it. 5. Breathe in slowly and as deeply as possible, raising the piston or the ball toward the top of the column. 6. Hold your breath for 3-5 seconds or for as long as possible. Allow the piston or ball to fall to the bottom of the column. 7. Remove the mouthpiece from your mouth and breathe out normally. 8. Rest for a few seconds and repeat Steps 1 through 7 at least 10 times every 1-2 hours when you are awake. Take your time and take a few normal breaths between deep breaths. 9. The spirometer may include an indicator to show your best effort. Use the indicator as a goal to work toward  during each repetition. 10. After each set of 10 deep breaths, practice coughing to be sure your lungs are clear. If you have an incision (the cut made at the time of surgery), support your incision when coughing by placing a pillow or rolled up towels firmly  against it. Once you are able to get out of bed, walk around indoors and cough well. You may stop using the incentive spirometer when instructed by your caregiver.  RISKS AND COMPLICATIONS  Take your time so you do not get dizzy or light-headed.  If you are in pain, you may need to take or ask for pain medication before doing incentive spirometry. It is harder to take a deep breath if you are having pain. AFTER USE  Rest and breathe slowly and easily.  It can be helpful to keep track of a log of your progress. Your caregiver can provide you with a simple table to help with this. If you are using the spirometer at home, follow these instructions: [place] IF:   You are having difficultly using the spirometer.  You have trouble using the spirometer as often as instructed.  Your pain medication is not giving enough relief while using the spirometer.  You develop fever of 100.5 F (38.1 C) or higher. SEEK IMMEDIATE MEDICAL CARE IF:   You cough up bloody sputum that had not been present before.  You develop fever of 102 F (38.9 C) or greater.  You develop worsening pain at or near the incision site. MAKE SURE YOU:   Understand these instructions.  Will watch your condition.  Will get help right away if you are not doing well or get worse. Document Released: 09/06/2006 Document Revised: 07/19/2011 Document Reviewed: 11/07/2006 ExitCare Patient Information 2014 ExitCare, [place].   ________________________________________________________________________  WHAT IS A BLOOD TRANSFUSION? Blood Transfusion Information  A transfusion is the replacement of blood or some of its parts. Blood is made up of multiple cells which provide different functions.  Red blood cells carry oxygen and are used for blood loss replacement.  White blood cells fight against infection.  Platelets control bleeding.  Plasma helps clot blood.  Other blood products are available for  specialized needs, such as hemophilia or other clotting disorders. BEFORE THE TRANSFUSION  Who gives blood for transfusions?   Healthy volunteers who are fully evaluated to make sure their blood is safe. This is blood bank blood. Transfusion therapy is the safest it has ever been in the practice of medicine. Before blood is taken from a donor, a complete history is taken to make sure that person has no history of diseases nor engages in risky social behavior (examples are intravenous drug use or sexual activity with multiple partners). The donor's travel history is screened to minimize risk of transmitting infections, such as malaria. The donated blood is tested for signs of infectious diseases, such as HIV and hepatitis. The blood is then tested to be sure it is compatible with you in order to minimize the chance of a transfusion reaction. If you or a relative donates blood, this is often done in anticipation of surgery and is not appropriate for emergency situations. It takes many days to process the donated blood. RISKS AND COMPLICATIONS Although transfusion therapy is very safe and saves many lives, the main dangers of transfusion include:   Getting an infectious disease.  Developing a transfusion reaction. This is an allergic reaction to something in the blood you were given. Every precaution is taken to  prevent this. The decision to have a blood transfusion has been considered carefully by your caregiver before blood is given. Blood is not given unless the benefits outweigh the risks. AFTER THE TRANSFUSION  Right after receiving a blood transfusion, you will usually feel much better and more energetic. This is especially true if your red blood cells have gotten low (anemic). The transfusion raises the level of the red blood cells which carry oxygen, and this usually causes an energy increase.  The nurse administering the transfusion will monitor you carefully for complications. HOME CARE  INSTRUCTIONS  No special instructions are needed after a transfusion. You may find your energy is better. Speak with your caregiver about any limitations on activity for underlying diseases you may have. SEEK MEDICAL CARE IF:   Your condition is not improving after your transfusion.  You develop redness or irritation at the intravenous (IV) site. SEEK IMMEDIATE MEDICAL CARE IF:  Any of the following symptoms occur over the next 12 hours:  Shaking chills.  You have a temperature by mouth above 102 F (38.9 C), not controlled by medicine.  Chest, back, or muscle pain.  People around you feel you are not acting correctly or are confused.  Shortness of breath or difficulty breathing.  Dizziness and fainting.  You get a rash or develop hives.  You have a decrease in urine output.  Your urine turns a dark color or changes to pink, red, or brown. Any of the following symptoms occur over the next 10 days:  You have a temperature by mouth above 102 F (38.9 C), not controlled by medicine.  Shortness of breath.  Weakness after normal activity.  The white part of the eye turns yellow (jaundice).  You have a decrease in the amount of urine or are urinating less often.  Your urine turns a dark color or changes to pink, red, or brown. Document Released: 04/23/2000 Document Revised: 07/19/2011 Document Reviewed: 12/11/2007 [facility] Patient Information 2014 [place], [place].  _______________________________________________________________________

## 2018-02-27 ENCOUNTER — Other Ambulatory Visit: Payer: Self-pay | Admitting: Urology

## 2018-03-02 ENCOUNTER — Other Ambulatory Visit: Payer: Self-pay

## 2018-03-02 ENCOUNTER — Encounter (HOSPITAL_COMMUNITY)
Admission: RE | Admit: 2018-03-02 | Discharge: 2018-03-02 | Disposition: A | Discharge status: Home / Self Care | Payer: Medicare Other | Source: Ambulatory Visit | Attending: Surgery | Admitting: Surgery

## 2018-03-02 ENCOUNTER — Encounter (HOSPITAL_COMMUNITY): Payer: Self-pay

## 2018-03-02 DIAGNOSIS — Z933 Colostomy status: Secondary | ICD-10-CM | POA: Diagnosis not present

## 2018-03-02 DIAGNOSIS — K469 Unspecified abdominal hernia without obstruction or gangrene: Secondary | ICD-10-CM | POA: Insufficient documentation

## 2018-03-02 DIAGNOSIS — Z01812 Encounter for preprocedural laboratory examination: Secondary | ICD-10-CM | POA: Diagnosis not present

## 2018-03-02 LAB — COMPREHENSIVE METABOLIC PANEL
ALBUMIN: 3.8 g/dL (ref 3.5–5.0)
ALT: 17 U/L (ref 0–44)
AST: 21 U/L (ref 15–41)
Alkaline Phosphatase: 52 U/L (ref 38–126)
Anion gap: 9 (ref 5–15)
BILIRUBIN TOTAL: 0.9 mg/dL (ref 0.3–1.2)
BUN: 23 mg/dL (ref 8–23)
CHLORIDE: 107 mmol/L (ref 98–111)
CO2: 23 mmol/L (ref 22–32)
CREATININE: 1.46 mg/dL — AB (ref 0.61–1.24)
Calcium: 9.4 mg/dL (ref 8.9–10.3)
GFR calc Af Amer: 54 mL/min — ABNORMAL LOW (ref >60)
GFR calc non Af Amer: 46 mL/min — ABNORMAL LOW (ref >60)
GLUCOSE: 164 mg/dL — AB (ref 70–99)
Potassium: 4.4 mmol/L (ref 3.5–5.1)
Sodium: 139 mmol/L (ref 135–145)
TOTAL PROTEIN: 7.1 g/dL (ref 6.5–8.1)

## 2018-03-02 LAB — CBC WITH DIFFERENTIAL/PLATELET
ABS IMMATURE GRANULOCYTES: 0.02 10*3/uL (ref 0.00–0.07)
BASOS ABS: 0 10*3/uL (ref 0.0–0.1)
Basophils Relative: 1 %
Eosinophils Absolute: 0.2 10*3/uL (ref 0.0–0.5)
Eosinophils Relative: 3 %
HEMATOCRIT: 39.3 % (ref 39.0–52.0)
Hemoglobin: 12.7 g/dL — ABNORMAL LOW (ref 13.0–17.0)
Immature Granulocytes: 0 %
LYMPHS PCT: 28 %
Lymphs Abs: 1.9 10*3/uL (ref 0.7–4.0)
MCH: 32.6 pg (ref 26.0–34.0)
MCHC: 32.3 g/dL (ref 30.0–36.0)
MCV: 101 fL — AB (ref 80.0–100.0)
MONO ABS: 0.6 10*3/uL (ref 0.1–1.0)
MONOS PCT: 9 %
NEUTROS ABS: 3.9 10*3/uL (ref 1.7–7.7)
Neutrophils Relative %: 59 %
Platelets: 159 10*3/uL (ref 150–400)
RBC: 3.89 MIL/uL — ABNORMAL LOW (ref 4.22–5.81)
RDW: 13.6 % (ref 11.5–15.5)
WBC: 6.6 10*3/uL (ref 4.0–10.5)
nRBC: 0 % (ref 0.0–0.2)

## 2018-03-02 LAB — GLUCOSE, CAPILLARY: GLUCOSE-CAPILLARY: 211 mg/dL — AB (ref 70–99)

## 2018-03-02 LAB — HEMOGLOBIN A1C
HEMOGLOBIN A1C: 7.4 % — AB (ref 4.8–5.6)
Mean Plasma Glucose: 165.68 mg/dL

## 2018-03-02 NOTE — Progress Notes (Signed)
   03/02/18 1102  OBSTRUCTIVE SLEEP APNEA  Have you ever been diagnosed with sleep apnea through a sleep study? No  Do you snore loudly (loud enough to be heard through closed doors)?  1  Do you often feel tired, fatigued, or sleepy during the daytime (such as falling asleep during driving or talking to someone)? 0  Has anyone observed you stop breathing during your sleep? 1  Do you have, or are you being treated for high blood pressure? 1  BMI more than 35 kg/m2? 0  Age > 50 (1-yes) 1  Neck circumference greater than:Male 16 inches or larger, Male 17inches or larger? 0  Male Gender (Yes=1) 1  Obstructive Sleep Apnea Score 5  Score 5 or greater  Results sent to PCP

## 2018-03-02 NOTE — Progress Notes (Signed)
cmet results routed to dr Harrell Gave white epic inbasket

## 2018-03-07 ENCOUNTER — Other Ambulatory Visit: Payer: Self-pay | Admitting: *Deleted

## 2018-03-07 ENCOUNTER — Encounter: Payer: Self-pay | Admitting: *Deleted

## 2018-03-07 MED ORDER — BUPIVACAINE LIPOSOME 1.3 % IJ SUSP
20.0000 mL | INTRAMUSCULAR | Status: DC
  Filled 2018-03-07: qty 20

## 2018-03-07 NOTE — Anesthesia Preprocedure Evaluation (Addendum)
Anesthesia Evaluation  Patient identified by MRN, date of birth, ID band Patient awake    Reviewed: Allergy & Precautions, NPO status , Patient's Chart, lab work & pertinent test results  Airway Mallampati: II  TM Distance: >3 FB Neck ROM: Full    Dental  (+) Chipped, Missing,    Pulmonary former smoker,    Pulmonary exam normal breath sounds clear to auscultation       Cardiovascular hypertension, Pt. on home beta blockers and Pt. on medications + CAD, + Past MI and + Cardiac Stents (x 1 in 2001)  Normal cardiovascular exam+ dysrhythmias Atrial Fibrillation  Rhythm:Regular Rate:Normal  ECG: a-fib, rate 88  Myocardial perfusion Nuclear stress EF: 50%. There is dyskinesis anteroapical segment. There was no ST segment deviation noted during stress. Defect 1: There is a medium defect of moderate severity present in the mid anteroseptal, apical anterior and apical septal location. Findings consistent with prior myocardial infarction with peri-infarct ischemia. This is an intermediate risk study.  Sees cardiologist Glennie Hawk) 12/09/2017- noted in [place], office note from Dr. Angelena Form   Neuro/Psych negative neurological ROS  negative psych ROS   GI/Hepatic Neg liver ROS, GERD  Medicated and Controlled,  Endo/Other  diabetes, Oral Hypoglycemic Agents, Insulin Dependent  Renal/GU Renal disease     Musculoskeletal negative musculoskeletal ROS (+)   Abdominal   Peds  Hematology  (+) anemia , HLD   Anesthesia Other Findings S/p colostomy Diverticulitis  Reproductive/Obstetrics                           Anesthesia Physical Anesthesia Plan  ASA: III  Anesthesia Plan: General   Post-op Pain Management:    Induction: Intravenous  PONV Risk Score and Plan: 4 or greater and Midazolam, Dexamethasone, Ondansetron and Treatment may vary due to age or medical condition  Airway Management Planned:  Oral ETT  Additional Equipment:   Intra-op Plan:   Post-operative Plan: Extubation in OR  Informed Consent:   Plan Discussed with:   Anesthesia Plan Comments:         Anesthesia Quick Evaluation

## 2018-03-07 NOTE — Patient Outreach (Signed)
[facility]) Care Management  [place]  03/08/2018   [name] [birth year] [id number]   [place] Quarterly Outreach   Referral Date: 09/29/16 Referral Source: self referral Reason for Referral: Not able to afford prescriptions due to cost Insurance: [employer]   Outreach Attempt:  Successful telephone outreach to patient for quarterly follow up.  HIPAA verified with wife per patient's request.  Wife stating patient is prepping for his upcoming surgery in the morning for his colostomy reversal.  Is taking his laxatives for bowel cleansing and on clear liquids.  Fasting blood sugar this morning was 95 with fasting ranges of 120-130's. Reporting a decrease in hypoglycemic events over the past few weeks.  Comfort and support given for upcoming colostomy reversal surgery.  Discussed with wife Hospital Liaison will see them while patient hospitalized to discuss discharge needs.  Encounter Medications:  Facility-Administered Encounter Medications as of 03/07/2018  Medication  . bupivacaine liposome (EXPAREL) 1.3 % injection 266 mg   Outpatient Encounter Medications as of 03/07/2018  Medication Sig  . atorvastatin (LIPITOR) 20 MG tablet Take 20 mg by mouth at bedtime.   . ferrous sulfate 325 (65 FE) MG tablet Take 325 mg by mouth 3 (three) times a week.  . Insulin Isophane & Regular Human (HUMULIN 70/30 KWIKPEN) (70-30) 100 UNIT/ML PEN INJECT SUBCUTANEOUSLY 25  UNITS BEFORE BREAKFAST AND  20 UNITS BEFORE DINNER (Patient taking differently: Inject 20-25 Units into the skin See admin instructions. INJECT SUBCUTANEOUSLY 25  UNITS BEFORE BREAKFAST AND  20 UNITS BEFORE DINNER)  . metFORMIN (GLUCOPHAGE) 500 MG tablet Take 1 tablet (500 mg total) by mouth daily with breakfast.  . metoprolol tartrate (LOPRESSOR) 25 MG tablet Take 12.5 mg by mouth 2 (two) times daily.   . nitroGLYCERIN (NITROSTAT) 0.4 MG SL tablet Place 0.4 mg under the tongue every  5 (five) minutes as needed for chest pain.   Marland Kitchen omeprazole (PRILOSEC) 20 MG capsule Take 20 mg by mouth daily as needed (for acid reflux/indigestion).   . Probiotic Product (PROBIOTIC-10 PO) Take 1 capsule by mouth 3 (three) times a week.   . ramipril (ALTACE) 10 MG capsule Take 10 mg by mouth daily.  Marland Kitchen RELION PEN NEEDLES 32G X 4 MM MISC USE TWO TIMES A DAY AS ADVISED  . rivaroxaban (XARELTO) 20 MG TABS tablet Take 1 tablet (20 mg total) by mouth daily.  . TRULICITY 1.5 FT/7.3UK SOPN INJECT SUBCUTANEOUSLY 1.5  MG WEEKLY (Patient taking differently: Inject 1.5 mg into the skin every Sunday. )    Functional Status:  In your present state of health, do you have any difficulty performing the following activities: 03/07/2018 03/02/2018  Hearing? Y N  Comment bilateral hearing aides -  Vision? N N  Difficulty concentrating or making decisions? N N  Walking or climbing stairs? N N  Dressing or bathing? N N  Doing errands, shopping? N N  Preparing Food and eating ? N -  Using the Toilet? N -  In the past six months, have you accidently leaked urine? N -  Do you have problems with loss of bowel control? N -  Managing your Medications? N -  Managing your Finances? N -  Housekeeping or managing your Housekeeping? N -  Some recent data might be hidden    Fall/Depression Screening: Fall Risk  03/07/2018 12/05/2017 08/12/2017  Falls in the past year? No No No   PHQ 2/9 Scores 03/07/2018 03/02/2017 10/06/2016 10/01/2016 10/03/2014  PHQ -  2 Score 0 0 0 0 0    THN CM Care Plan Problem One     Most Recent Value  Care Plan Problem One  knowledge deficiet related to post operative care  Role Documenting the Problem One  [place] for Problem One  Active  [facility] Long Term Goal   Patient will decrease his Hgb A1C by 0.5 points in the next 60 days  THN Long Term Goal Start Date  03/07/18  Interventions for Problem One Long Term Goal  Confirmed Endocrinology appointment is 03/29/2018,  encouraged patient to keep and attend scheduled appointments, reviewed medications and encouraged compliance, reviewed healthier food and drink choices lower in carbohydrates, reviewed signs and symptoms of hypo and hyperglycemia  THN CM Short Term Goal #1   Patient will verbalize no signs and symptoms of surgical infection after colostomy reversal in the next 30 days  THN CM Short Term Goal #1 Start Date  03/07/18  Interventions for Short Term Goal #1  Confirmed patient is scheduled for colostomy reversal surgery on 03/08/2018, reviewed signs and symptoms of infection with wife     Appointments:  Patient is scheduled for surgery on 03/08/2018 for colostomy reversal.  Has scheduled appointment with Dr. Cruzita Lederer, Endocrinologist on 03/29/2018.  Will see primary care provider, Cyndi Bender PA around December 2019, last appointment was 10/13/2017.  Plan: RN Health Coach will notify Hospital Liaison of patients surgical admission and await recommendations for discharge planning. RN Health Coach will send primary care provider Quarterly Update.  [place] [phone] [name]

## 2018-03-08 ENCOUNTER — Inpatient Hospital Stay (HOSPITAL_COMMUNITY): Payer: Medicare Other | Admitting: Anesthesiology

## 2018-03-08 ENCOUNTER — Inpatient Hospital Stay (HOSPITAL_COMMUNITY)
Admission: RE | Admit: 2018-03-08 | Discharge: 2018-03-12 | DRG: 346 | Disposition: A | Discharge status: Home / Self Care | Payer: Medicare Other | Attending: Surgery | Admitting: Surgery

## 2018-03-08 ENCOUNTER — Encounter (HOSPITAL_COMMUNITY)
Admission: RE | Disposition: A | Discharge status: Home / Self Care | Payer: Self-pay | Source: Home / Self Care | Attending: Surgery

## 2018-03-08 ENCOUNTER — Encounter (HOSPITAL_COMMUNITY): Payer: Self-pay | Admitting: *Deleted

## 2018-03-08 ENCOUNTER — Other Ambulatory Visit: Payer: Self-pay

## 2018-03-08 ENCOUNTER — Inpatient Hospital Stay (HOSPITAL_COMMUNITY): Payer: Medicare Other

## 2018-03-08 DIAGNOSIS — Z8249 Family history of ischemic heart disease and other diseases of the circulatory system: Secondary | ICD-10-CM

## 2018-03-08 DIAGNOSIS — Z87891 Personal history of nicotine dependence: Secondary | ICD-10-CM

## 2018-03-08 DIAGNOSIS — Z79899 Other long term (current) drug therapy: Secondary | ICD-10-CM

## 2018-03-08 DIAGNOSIS — I714 Abdominal aortic aneurysm, without rupture: Secondary | ICD-10-CM | POA: Diagnosis present

## 2018-03-08 DIAGNOSIS — Z408 Encounter for other prophylactic surgery: Secondary | ICD-10-CM | POA: Diagnosis not present

## 2018-03-08 DIAGNOSIS — E1122 Type 2 diabetes mellitus with diabetic chronic kidney disease: Secondary | ICD-10-CM | POA: Diagnosis not present

## 2018-03-08 DIAGNOSIS — Z433 Encounter for attention to colostomy: Secondary | ICD-10-CM | POA: Diagnosis not present

## 2018-03-08 DIAGNOSIS — Z7982 Long term (current) use of aspirin: Secondary | ICD-10-CM

## 2018-03-08 DIAGNOSIS — E039 Hypothyroidism, unspecified: Secondary | ICD-10-CM | POA: Diagnosis present

## 2018-03-08 DIAGNOSIS — Z7901 Long term (current) use of anticoagulants: Secondary | ICD-10-CM

## 2018-03-08 DIAGNOSIS — E785 Hyperlipidemia, unspecified: Secondary | ICD-10-CM | POA: Diagnosis present

## 2018-03-08 DIAGNOSIS — Z833 Family history of diabetes mellitus: Secondary | ICD-10-CM

## 2018-03-08 DIAGNOSIS — Z9049 Acquired absence of other specified parts of digestive tract: Secondary | ICD-10-CM | POA: Diagnosis not present

## 2018-03-08 DIAGNOSIS — Z933 Colostomy status: Secondary | ICD-10-CM | POA: Diagnosis not present

## 2018-03-08 DIAGNOSIS — K435 Parastomal hernia without obstruction or  gangrene: Secondary | ICD-10-CM | POA: Diagnosis present

## 2018-03-08 DIAGNOSIS — I129 Hypertensive chronic kidney disease with stage 1 through stage 4 chronic kidney disease, or unspecified chronic kidney disease: Secondary | ICD-10-CM | POA: Diagnosis not present

## 2018-03-08 DIAGNOSIS — K5730 Diverticulosis of large intestine without perforation or abscess without bleeding: Secondary | ICD-10-CM | POA: Diagnosis not present

## 2018-03-08 DIAGNOSIS — I4891 Unspecified atrial fibrillation: Secondary | ICD-10-CM | POA: Diagnosis not present

## 2018-03-08 DIAGNOSIS — I252 Old myocardial infarction: Secondary | ICD-10-CM | POA: Diagnosis not present

## 2018-03-08 DIAGNOSIS — N183 Chronic kidney disease, stage 3 (moderate): Secondary | ICD-10-CM | POA: Diagnosis not present

## 2018-03-08 DIAGNOSIS — Z955 Presence of coronary angioplasty implant and graft: Secondary | ICD-10-CM | POA: Diagnosis not present

## 2018-03-08 DIAGNOSIS — K56699 Other intestinal obstruction unspecified as to partial versus complete obstruction: Secondary | ICD-10-CM | POA: Diagnosis not present

## 2018-03-08 DIAGNOSIS — I2510 Atherosclerotic heart disease of native coronary artery without angina pectoris: Secondary | ICD-10-CM | POA: Diagnosis not present

## 2018-03-08 DIAGNOSIS — Z9889 Other specified postprocedural states: Secondary | ICD-10-CM

## 2018-03-08 HISTORY — PX: CYSTOSCOPY W/ URETERAL STENT PLACEMENT: SHX1429

## 2018-03-08 HISTORY — PX: FLEXIBLE SIGMOIDOSCOPY: SHX5431

## 2018-03-08 HISTORY — PX: COLOSTOMY TAKEDOWN: SHX5258

## 2018-03-08 HISTORY — PX: LAPAROSCOPIC PARASTOMAL HERNIA: SHX6347

## 2018-03-08 LAB — TYPE AND SCREEN
ABO/RH(D): O POS
Antibody Screen: NEGATIVE

## 2018-03-08 LAB — GLUCOSE, CAPILLARY
GLUCOSE-CAPILLARY: 131 mg/dL — AB (ref 70–99)
GLUCOSE-CAPILLARY: 228 mg/dL — AB (ref 70–99)
Glucose-Capillary: 310 mg/dL — ABNORMAL HIGH (ref 70–99)
Glucose-Capillary: 201 mg/dL — ABNORMAL HIGH (ref 70–99)
Glucose-Capillary: 83 mg/dL (ref 70–99)
Glucose-Capillary: 62 mg/dL — ABNORMAL LOW (ref 70–99)
Glucose-Capillary: 74 mg/dL (ref 70–99)
Glucose-Capillary: 117 mg/dL — ABNORMAL HIGH (ref 70–99)

## 2018-03-08 SURGERY — CLOSURE, COLOSTOMY, LAPAROSCOPIC
Anesthesia: General | Site: Ureter

## 2018-03-08 MED ORDER — HYDRALAZINE HCL 20 MG/ML IJ SOLN: 10.0000 mg | INTRAMUSCULAR | Status: DC | PRN

## 2018-03-08 MED ORDER — FENTANYL CITRATE (PF) 100 MCG/2ML IJ SOLN
INTRAMUSCULAR | Status: AC
  Filled 2018-03-08: qty 2

## 2018-03-08 MED ORDER — ROCURONIUM BROMIDE 10 MG/ML (PF) SYRINGE
PREFILLED_SYRINGE | INTRAVENOUS | Status: DC | PRN
  Administered 2018-03-08 (×2): 10 mg via INTRAVENOUS
  Administered 2018-03-08: 50 mg via INTRAVENOUS
  Administered 2018-03-08: 5 mg via INTRAVENOUS
  Administered 2018-03-08: 10 mg via INTRAVENOUS
  Administered 2018-03-08: 20 mg via INTRAVENOUS

## 2018-03-08 MED ORDER — PANTOPRAZOLE SODIUM 40 MG PO TBEC
40.0000 mg | DELAYED_RELEASE_TABLET | Freq: Every day | ORAL | Status: DC
  Administered 2018-03-09 – 2018-03-12 (×4): 40 mg via ORAL
  Filled 2018-03-08 (×4): qty 1

## 2018-03-08 MED ORDER — MIDAZOLAM HCL 2 MG/2ML IJ SOLN
INTRAMUSCULAR | Status: AC
  Filled 2018-03-08: qty 2

## 2018-03-08 MED ORDER — DIPHENHYDRAMINE HCL 12.5 MG/5ML PO ELIX: 12.5000 mg | ORAL_SOLUTION | Freq: Four times a day (QID) | ORAL | Status: DC | PRN

## 2018-03-08 MED ORDER — DEXAMETHASONE SODIUM PHOSPHATE 10 MG/ML IJ SOLN
INTRAMUSCULAR | Status: DC | PRN
  Administered 2018-03-08: 4 mg via INTRAVENOUS

## 2018-03-08 MED ORDER — FENTANYL CITRATE (PF) 250 MCG/5ML IJ SOLN
INTRAMUSCULAR | Status: DC | PRN
  Administered 2018-03-08: 75 ug via INTRAVENOUS
  Administered 2018-03-08: 50 ug via INTRAVENOUS
  Administered 2018-03-08: 25 ug via INTRAVENOUS
  Administered 2018-03-08: 50 ug via INTRAVENOUS

## 2018-03-08 MED ORDER — MIDAZOLAM HCL 2 MG/2ML IJ SOLN
INTRAMUSCULAR | Status: DC | PRN
  Administered 2018-03-08: 1 mg via INTRAVENOUS

## 2018-03-08 MED ORDER — ALVIMOPAN 12 MG PO CAPS
12.0000 mg | ORAL_CAPSULE | ORAL | Status: AC
  Administered 2018-03-08: 12 mg via ORAL
  Filled 2018-03-08: qty 1

## 2018-03-08 MED ORDER — BUPIVACAINE HCL (PF) 0.25 % IJ SOLN
INTRAMUSCULAR | Status: AC
  Filled 2018-03-08: qty 30

## 2018-03-08 MED ORDER — LIDOCAINE 2% (20 MG/ML) 5 ML SYRINGE
INTRAMUSCULAR | Status: DC | PRN
  Administered 2018-03-08: 1.5 mg/kg/h via INTRAVENOUS

## 2018-03-08 MED ORDER — DEXAMETHASONE SODIUM PHOSPHATE 10 MG/ML IJ SOLN
INTRAMUSCULAR | Status: AC
  Filled 2018-03-08: qty 1

## 2018-03-08 MED ORDER — PROPOFOL 10 MG/ML IV BOLUS
INTRAVENOUS | Status: DC | PRN
  Administered 2018-03-08: 50 mg via INTRAVENOUS
  Administered 2018-03-08: 100 mg via INTRAVENOUS
  Administered 2018-03-08: 50 mg via INTRAVENOUS

## 2018-03-08 MED ORDER — ROCURONIUM BROMIDE 10 MG/ML (PF) SYRINGE
PREFILLED_SYRINGE | INTRAVENOUS | Status: AC
  Filled 2018-03-08: qty 10

## 2018-03-08 MED ORDER — LACTATED RINGERS IR SOLN
Status: DC | PRN
  Administered 2018-03-08: 1000 mL

## 2018-03-08 MED ORDER — ONDANSETRON HCL 4 MG/2ML IJ SOLN: 4.0000 mg | Freq: Four times a day (QID) | INTRAMUSCULAR | Status: DC | PRN

## 2018-03-08 MED ORDER — ONDANSETRON HCL 4 MG PO TABS: 4.0000 mg | ORAL_TABLET | Freq: Four times a day (QID) | ORAL | Status: DC | PRN

## 2018-03-08 MED ORDER — EPHEDRINE 5 MG/ML INJ
INTRAVENOUS | Status: AC
  Filled 2018-03-08: qty 10

## 2018-03-08 MED ORDER — SODIUM CHLORIDE 0.9 % IV SOLN
2.0000 g | INTRAVENOUS | Status: AC
  Administered 2018-03-08: 2 g via INTRAVENOUS
  Filled 2018-03-08: qty 2

## 2018-03-08 MED ORDER — NITROGLYCERIN 0.4 MG SL SUBL: 0.4000 mg | SUBLINGUAL_TABLET | SUBLINGUAL | Status: DC | PRN

## 2018-03-08 MED ORDER — SUGAMMADEX SODIUM 200 MG/2ML IV SOLN
INTRAVENOUS | Status: AC
  Filled 2018-03-08: qty 2

## 2018-03-08 MED ORDER — ONDANSETRON HCL 4 MG/2ML IJ SOLN: 4.0000 mg | Freq: Once | INTRAMUSCULAR | Status: DC | PRN

## 2018-03-08 MED ORDER — IOHEXOL 300 MG/ML  SOLN
INTRAMUSCULAR | Status: DC | PRN
  Administered 2018-03-08: 15 mL

## 2018-03-08 MED ORDER — SODIUM CHLORIDE 0.9 % IR SOLN
Status: DC | PRN
  Administered 2018-03-08: 1000 mL

## 2018-03-08 MED ORDER — BUPIVACAINE LIPOSOME 1.3 % IJ SUSP
INTRAMUSCULAR | Status: DC | PRN
  Administered 2018-03-08: 20 mL

## 2018-03-08 MED ORDER — HEPARIN SODIUM (PORCINE) 5000 UNIT/ML IJ SOLN
5000.0000 [IU] | Freq: Three times a day (TID) | INTRAMUSCULAR | Status: DC
  Administered 2018-03-08 – 2018-03-10 (×5): 5000 [IU] via SUBCUTANEOUS
  Filled 2018-03-08 (×5): qty 1

## 2018-03-08 MED ORDER — PHENYLEPHRINE 40 MCG/ML (10ML) SYRINGE FOR IV PUSH (FOR BLOOD PRESSURE SUPPORT)
PREFILLED_SYRINGE | INTRAVENOUS | Status: AC
  Filled 2018-03-08: qty 10

## 2018-03-08 MED ORDER — GABAPENTIN 300 MG PO CAPS
300.0000 mg | ORAL_CAPSULE | ORAL | Status: AC
  Administered 2018-03-08: 300 mg via ORAL
  Filled 2018-03-08: qty 1

## 2018-03-08 MED ORDER — ONDANSETRON HCL 4 MG/2ML IJ SOLN
INTRAMUSCULAR | Status: DC | PRN
  Administered 2018-03-08: 4 mg via INTRAVENOUS

## 2018-03-08 MED ORDER — 0.9 % SODIUM CHLORIDE (POUR BTL) OPTIME
TOPICAL | Status: DC | PRN
  Administered 2018-03-08: 2000 mL

## 2018-03-08 MED ORDER — INSULIN ASPART 100 UNIT/ML ~~LOC~~ SOLN
SUBCUTANEOUS | Status: AC
  Filled 2018-03-08: qty 1

## 2018-03-08 MED ORDER — ENSURE SURGERY PO LIQD
237.0000 mL | Freq: Two times a day (BID) | ORAL | Status: DC
  Administered 2018-03-09 – 2018-03-11 (×4): 237 mL via ORAL
  Filled 2018-03-08 (×8): qty 237

## 2018-03-08 MED ORDER — CHLORHEXIDINE GLUCONATE CLOTH 2 % EX PADS: 6.0000 | MEDICATED_PAD | Freq: Once | CUTANEOUS | Status: DC

## 2018-03-08 MED ORDER — STERILE WATER FOR IRRIGATION IR SOLN
Status: DC | PRN
  Administered 2018-03-08: 1000 mL

## 2018-03-08 MED ORDER — HYDROMORPHONE HCL 1 MG/ML IJ SOLN: 0.5000 mg | INTRAMUSCULAR | Status: DC | PRN

## 2018-03-08 MED ORDER — DIPHENHYDRAMINE HCL 50 MG/ML IJ SOLN: 12.5000 mg | Freq: Four times a day (QID) | INTRAMUSCULAR | Status: DC | PRN

## 2018-03-08 MED ORDER — BUPIVACAINE HCL (PF) 0.25 % IJ SOLN
INTRAMUSCULAR | Status: DC | PRN
  Administered 2018-03-08: 30 mL

## 2018-03-08 MED ORDER — PHENYLEPHRINE HCL 10 MG/ML IJ SOLN
INTRAMUSCULAR | Status: AC
  Filled 2018-03-08: qty 2

## 2018-03-08 MED ORDER — FENTANYL CITRATE (PF) 100 MCG/2ML IJ SOLN
25.0000 ug | INTRAMUSCULAR | Status: DC | PRN
  Administered 2018-03-08 (×2): 25 ug via INTRAVENOUS

## 2018-03-08 MED ORDER — SODIUM CHLORIDE 0.9 % IV SOLN
INTRAVENOUS | Status: DC | PRN
  Administered 2018-03-08: 35 ug/min via INTRAVENOUS

## 2018-03-08 MED ORDER — PROPOFOL 10 MG/ML IV BOLUS
INTRAVENOUS | Status: AC
  Filled 2018-03-08: qty 20

## 2018-03-08 MED ORDER — 0.9 % SODIUM CHLORIDE (POUR BTL) OPTIME
TOPICAL | Status: DC | PRN
  Administered 2018-03-08: 1000 mL

## 2018-03-08 MED ORDER — DEXTROSE 50 % IV SOLN
INTRAVENOUS | Status: AC
  Filled 2018-03-08: qty 50

## 2018-03-08 MED ORDER — LIDOCAINE 2% (20 MG/ML) 5 ML SYRINGE
INTRAMUSCULAR | Status: AC
  Filled 2018-03-08: qty 5

## 2018-03-08 MED ORDER — TRAMADOL HCL 50 MG PO TABS: 50.0000 mg | ORAL_TABLET | Freq: Four times a day (QID) | ORAL | Status: DC | PRN

## 2018-03-08 MED ORDER — KETAMINE HCL 10 MG/ML IJ SOLN
INTRAMUSCULAR | Status: AC
  Filled 2018-03-08: qty 1

## 2018-03-08 MED ORDER — INSULIN ASPART 100 UNIT/ML ~~LOC~~ SOLN
0.0000 [IU] | Freq: Three times a day (TID) | SUBCUTANEOUS | Status: DC
  Administered 2018-03-09: 3 [IU] via SUBCUTANEOUS
  Administered 2018-03-09: 2 [IU] via SUBCUTANEOUS
  Administered 2018-03-09 – 2018-03-11 (×5): 3 [IU] via SUBCUTANEOUS
  Administered 2018-03-11: 2 [IU] via SUBCUTANEOUS
  Administered 2018-03-11: 5 [IU] via SUBCUTANEOUS
  Administered 2018-03-12 (×2): 3 [IU] via SUBCUTANEOUS

## 2018-03-08 MED ORDER — SUGAMMADEX SODIUM 200 MG/2ML IV SOLN
INTRAVENOUS | Status: DC | PRN
  Administered 2018-03-08: 200 mg via INTRAVENOUS

## 2018-03-08 MED ORDER — ATORVASTATIN CALCIUM 20 MG PO TABS
20.0000 mg | ORAL_TABLET | Freq: Every day | ORAL | Status: DC
  Administered 2018-03-08 – 2018-03-11 (×4): 20 mg via ORAL
  Filled 2018-03-08 (×4): qty 1

## 2018-03-08 MED ORDER — ALUM & MAG HYDROXIDE-SIMETH 200-200-20 MG/5ML PO SUSP: 30.0000 mL | Freq: Four times a day (QID) | ORAL | Status: DC | PRN

## 2018-03-08 MED ORDER — ONDANSETRON HCL 4 MG/2ML IJ SOLN
INTRAMUSCULAR | Status: AC
  Filled 2018-03-08: qty 2

## 2018-03-08 MED ORDER — LIDOCAINE 2% (20 MG/ML) 5 ML SYRINGE: INTRAMUSCULAR | Status: DC | PRN

## 2018-03-08 MED ORDER — ACETAMINOPHEN 500 MG PO TABS
1000.0000 mg | ORAL_TABLET | ORAL | Status: AC
  Administered 2018-03-08: 1000 mg via ORAL
  Filled 2018-03-08: qty 2

## 2018-03-08 MED ORDER — PHENYLEPHRINE 40 MCG/ML (10ML) SYRINGE FOR IV PUSH (FOR BLOOD PRESSURE SUPPORT)
PREFILLED_SYRINGE | INTRAVENOUS | Status: DC | PRN
  Administered 2018-03-08: 10 ug via INTRAVENOUS
  Administered 2018-03-08 (×2): 80 ug via INTRAVENOUS
  Administered 2018-03-08: 120 ug via INTRAVENOUS

## 2018-03-08 MED ORDER — KETAMINE HCL 10 MG/ML IJ SOLN
INTRAMUSCULAR | Status: DC | PRN
  Administered 2018-03-08 (×2): 10 mg via INTRAVENOUS

## 2018-03-08 MED ORDER — HEPARIN SODIUM (PORCINE) 5000 UNIT/ML IJ SOLN
5000.0000 [IU] | Freq: Once | INTRAMUSCULAR | Status: AC
  Administered 2018-03-08: 5000 [IU] via SUBCUTANEOUS
  Filled 2018-03-08: qty 1

## 2018-03-08 MED ORDER — SACCHAROMYCES BOULARDII 250 MG PO CAPS
250.0000 mg | ORAL_CAPSULE | Freq: Two times a day (BID) | ORAL | Status: DC
  Administered 2018-03-08 – 2018-03-12 (×8): 250 mg via ORAL
  Filled 2018-03-08 (×8): qty 1

## 2018-03-08 MED ORDER — METOPROLOL TARTRATE 12.5 MG HALF TABLET
12.5000 mg | ORAL_TABLET | Freq: Two times a day (BID) | ORAL | Status: DC
  Administered 2018-03-08 – 2018-03-12 (×7): 12.5 mg via ORAL
  Filled 2018-03-08 (×7): qty 1

## 2018-03-08 MED ORDER — RAMIPRIL 10 MG PO CAPS
10.0000 mg | ORAL_CAPSULE | Freq: Every day | ORAL | Status: DC
  Administered 2018-03-09 – 2018-03-12 (×4): 10 mg via ORAL
  Filled 2018-03-08 (×4): qty 1

## 2018-03-08 MED ORDER — INSULIN ASPART 100 UNIT/ML ~~LOC~~ SOLN
0.0000 [IU] | Freq: Every day | SUBCUTANEOUS | Status: DC
  Administered 2018-03-08: 2 [IU] via SUBCUTANEOUS

## 2018-03-08 MED ORDER — FENTANYL CITRATE (PF) 250 MCG/5ML IJ SOLN
INTRAMUSCULAR | Status: AC
  Filled 2018-03-08: qty 5

## 2018-03-08 MED ORDER — ALVIMOPAN 12 MG PO CAPS
12.0000 mg | ORAL_CAPSULE | Freq: Two times a day (BID) | ORAL | Status: DC
  Administered 2018-03-09 (×2): 12 mg via ORAL
  Filled 2018-03-08 (×3): qty 1

## 2018-03-08 MED ORDER — LACTATED RINGERS IV SOLN
INTRAVENOUS | Status: DC
  Administered 2018-03-08 (×3): via INTRAVENOUS

## 2018-03-08 MED ORDER — LIDOCAINE 2% (20 MG/ML) 5 ML SYRINGE
INTRAMUSCULAR | Status: DC | PRN
  Administered 2018-03-08: 50 mg via INTRAVENOUS

## 2018-03-08 MED ORDER — FERROUS SULFATE 325 (65 FE) MG PO TABS
325.0000 mg | ORAL_TABLET | ORAL | Status: DC
  Administered 2018-03-10: 325 mg via ORAL
  Filled 2018-03-08: qty 1

## 2018-03-08 MED ORDER — ACETAMINOPHEN 500 MG PO TABS
1000.0000 mg | ORAL_TABLET | Freq: Four times a day (QID) | ORAL | Status: DC
  Administered 2018-03-08 – 2018-03-11 (×9): 1000 mg via ORAL
  Filled 2018-03-08 (×12): qty 2

## 2018-03-08 MED ORDER — LACTATED RINGERS IV SOLN
INTRAVENOUS | Status: DC
  Administered 2018-03-08 – 2018-03-10 (×3): via INTRAVENOUS

## 2018-03-08 MED ORDER — INSULIN ASPART 100 UNIT/ML ~~LOC~~ SOLN
8.0000 [IU] | Freq: Once | SUBCUTANEOUS | Status: AC
  Administered 2018-03-08: 8 [IU] via SUBCUTANEOUS

## 2018-03-08 MED ORDER — DEXTROSE 50 % IV SOLN
INTRAVENOUS | Status: DC | PRN
  Administered 2018-03-08: 25 mL via INTRAVENOUS
  Administered 2018-03-08: 12.5 mL via INTRAVENOUS

## 2018-03-08 SURGICAL SUPPLY — 85 items
ADH SKN CLS APL DERMABOND .7 (GAUZE/BANDAGES/DRESSINGS) ×4
APPLIER CLIP 5 13 M/L LIGAMAX5 (MISCELLANEOUS)
APPLIER CLIP ROT 10 11.4 M/L (STAPLE)
APR CLP MED LRG 11.4X10 (STAPLE)
APR CLP MED LRG 5 ANG JAW (MISCELLANEOUS)
BAG URO CATCHER STRL LF (MISCELLANEOUS) ×10 IMPLANT
BLADE EXTENDED COATED 6.5IN (ELECTRODE) IMPLANT
CABLE HIGH FREQUENCY MONO STRZ (ELECTRODE) ×5 IMPLANT
CATH INTERMIT  6FR 70CM (CATHETERS) ×10 IMPLANT
CATH MUSHROOM 28FR (CATHETERS) IMPLANT
CATH MUSHROOM 30FR (CATHETERS) IMPLANT
CELLS DAT CNTRL 66122 CELL SVR (MISCELLANEOUS) IMPLANT
CLIP APPLIE 5 13 M/L LIGAMAX5 (MISCELLANEOUS) IMPLANT
CLIP APPLIE ROT 10 11.4 M/L (STAPLE) IMPLANT
CLOTH BEACON ORANGE TIMEOUT ST (SAFETY) ×10 IMPLANT
COVER WAND RF STERILE (DRAPES) ×2 IMPLANT
DECANTER SPIKE VIAL GLASS SM (MISCELLANEOUS) ×5 IMPLANT
DERMABOND ADVANCED (GAUZE/BANDAGES/DRESSINGS) ×1
DERMABOND ADVANCED .7 DNX12 (GAUZE/BANDAGES/DRESSINGS) ×1 IMPLANT
DISSECTOR BLUNT TIP ENDO 5MM (MISCELLANEOUS) IMPLANT
DRAIN CHANNEL 19F RND (DRAIN) IMPLANT
DRAPE SURG IRRIG POUCH 19X23 (DRAPES) ×5 IMPLANT
DRSG OPSITE POSTOP 4X10 (GAUZE/BANDAGES/DRESSINGS) IMPLANT
DRSG OPSITE POSTOP 4X6 (GAUZE/BANDAGES/DRESSINGS) IMPLANT
DRSG OPSITE POSTOP 4X8 (GAUZE/BANDAGES/DRESSINGS) IMPLANT
ELECT REM PT RETURN 15FT ADLT (MISCELLANEOUS) ×5 IMPLANT
EVACUATOR SILICONE 100CC (DRAIN) IMPLANT
GAUZE SPONGE 4X4 12PLY STRL (GAUZE/BANDAGES/DRESSINGS) ×2 IMPLANT
GLOVE BIO SURGEON STRL SZ7.5 (GLOVE) ×20 IMPLANT
GLOVE INDICATOR 8.0 STRL GRN (GLOVE) ×10 IMPLANT
GOWN STRL REUS W/TWL LRG LVL3 (GOWN DISPOSABLE) ×20 IMPLANT
GOWN STRL REUS W/TWL XL LVL3 (GOWN DISPOSABLE) ×20 IMPLANT
GUIDEWIRE STR DUAL SENSOR (WIRE) ×8 IMPLANT
HOLDER FOLEY CATH W/STRAP (MISCELLANEOUS) ×5 IMPLANT
LIGASURE IMPACT 36 18CM CVD LR (INSTRUMENTS) IMPLANT
MANIFOLD NEPTUNE II (INSTRUMENTS) ×10 IMPLANT
NDL INSUFFLATION 14GA 120MM (NEEDLE) IMPLANT
NEEDLE INSUFFLATION 14GA 120MM (NEEDLE) ×5 IMPLANT
PACK COLON (CUSTOM PROCEDURE TRAY) ×5 IMPLANT
PACK CYSTO (CUSTOM PROCEDURE TRAY) ×10 IMPLANT
PAD ABD 8X10 STRL (GAUZE/BANDAGES/DRESSINGS) ×2 IMPLANT
PAD POSITIONING PINK XL (MISCELLANEOUS) ×5 IMPLANT
PORT LAP GEL ALEXIS MED 5-9CM (MISCELLANEOUS) IMPLANT
RETRACTOR WND ALEXIS 18 MED (MISCELLANEOUS) IMPLANT
RTRCTR WOUND ALEXIS 18CM MED (MISCELLANEOUS)
SCISSORS LAP 5X35 DISP (ENDOMECHANICALS) ×5 IMPLANT
SEALER TISSUE G2 STRG ARTC 35C (ENDOMECHANICALS) ×5 IMPLANT
SET IRRIG TUBING LAPAROSCOPIC (IRRIGATION / IRRIGATOR) ×5 IMPLANT
SLEEVE ADV FIXATION 5X100MM (TROCAR) ×10 IMPLANT
SPONGE DRAIN TRACH 4X4 STRL 2S (GAUZE/BANDAGES/DRESSINGS) IMPLANT
STAPLER CIRC CVD 29MM 37CM (STAPLE) ×2 IMPLANT
STAPLER VISISTAT 35W (STAPLE) IMPLANT
SUT ETHILON 3 0 PS 1 (SUTURE) IMPLANT
SUT MNCRL AB 4-0 PS2 18 (SUTURE) ×2 IMPLANT
SUT PDS AB 1 CTX 36 (SUTURE) ×8 IMPLANT
SUT PDS AB 1 TP1 54 (SUTURE) IMPLANT
SUT PDS AB 1 TP1 96 (SUTURE) IMPLANT
SUT PROLENE 2 0 KS (SUTURE) ×5 IMPLANT
SUT PROLENE 2 0 SH DA (SUTURE) ×5 IMPLANT
SUT SILK 2 0 (SUTURE) ×5
SUT SILK 2 0 SH CR/8 (SUTURE) ×5 IMPLANT
SUT SILK 2-0 18XBRD TIE 12 (SUTURE) ×4 IMPLANT
SUT SILK 3 0 (SUTURE) ×5
SUT SILK 3 0 SH CR/8 (SUTURE) ×5 IMPLANT
SUT SILK 3-0 18XBRD TIE 12 (SUTURE) ×4 IMPLANT
SUT VIC AB 2-0 SH 27 (SUTURE) ×5
SUT VIC AB 2-0 SH 27X BRD (SUTURE) ×1 IMPLANT
SUT VIC AB 3-0 SH 18 (SUTURE) IMPLANT
SUT VIC AB 3-0 SH 27 (SUTURE)
SUT VIC AB 3-0 SH 27X BRD (SUTURE) IMPLANT
SUT VICRYL 2 0 18  UND BR (SUTURE) ×1
SUT VICRYL 2 0 18 UND BR (SUTURE) ×4 IMPLANT
SYS LAPSCP GELPORT 120MM (MISCELLANEOUS)
SYSTEM LAPSCP GELPORT 120MM (MISCELLANEOUS) IMPLANT
TAPE CLOTH SURG 6X10 WHT LF (GAUZE/BANDAGES/DRESSINGS) ×2 IMPLANT
TOWEL OR 17X26 10 PK STRL BLUE (TOWEL DISPOSABLE) IMPLANT
TOWEL OR NON WOVEN STRL DISP B (DISPOSABLE) ×5 IMPLANT
TRAY FOLEY MTR SLVR 14FR STAT (SET/KITS/TRAYS/PACK) IMPLANT
TRAY FOLEY MTR SLVR 16FR STAT (SET/KITS/TRAYS/PACK) IMPLANT
TRAY IRRIG W/60CC SYR STRL (SET/KITS/TRAYS/PACK) ×5 IMPLANT
TROCAR ADV FIXATION 5X100MM (TROCAR) ×5 IMPLANT
TROCAR BLADELESS OPT 5 100 (ENDOMECHANICALS) ×2 IMPLANT
TROCAR XCEL BLUNT TIP 100MML (ENDOMECHANICALS) IMPLANT
TUBING CONNECTING 10 (TUBING) ×16 IMPLANT
TUBING INSUF HEATED (TUBING) ×5 IMPLANT

## 2018-03-08 NOTE — Transfer of Care (Signed)
Immediate Anesthesia Transfer of Care Note  Patient: [name]  Procedure(s) Performed: LAPAROSCOPIC HARTMANNS REVERSAL (N/A Abdomen) PARASTOMAL HERNIA REPAIR (N/A Abdomen) FLEXIBLE SIGMOIDOSCOPY (N/A Rectum) CYSTOSCOPY WITH RETROGRADE AND BILATERAL URETERAL STENT PLACEMENT (Bilateral Ureter)  Patient Location: PACU  Anesthesia Type:General  Level of Consciousness: awake and patient cooperative  Airway & Oxygen Therapy: Patient Spontanous Breathing and Patient connected to face mask oxygen  Post-op Assessment: Report given to RN and Post -op Vital signs reviewed and stable  Post vital signs: Reviewed and stable  Last Vitals:  Vitals Value Taken Time  BP 118/90 03/08/2018 11:20 AM  Temp    Pulse 87 03/08/2018 11:23 AM  Resp 21 03/08/2018 11:23 AM  SpO2 93 % 03/08/2018 11:23 AM  Vitals shown include unvalidated device data.  Last Pain:  Vitals:   03/08/18 0548  TempSrc: Oral         Complications: No apparent anesthesia complications

## 2018-03-08 NOTE — Anesthesia Procedure Notes (Signed)
Procedure Name: Intubation Date/Time: 03/08/2018 7:43 AM Performed by: Niel Hummer, CRNA Pre-anesthesia Checklist: Patient identified, Emergency Drugs available, Suction available and Patient being monitored Patient Re-evaluated:Patient Re-evaluated prior to induction Oxygen Delivery Method: Circle System Utilized Preoxygenation: Pre-oxygenation with 100% oxygen Induction Type: IV induction Ventilation: Mask ventilation without difficulty Laryngoscope Size: Mac and 4 Grade View: Grade I Tube type: Oral Laser Tube: Cuffed inflated with minimal occlusive pressure - saline Tube size: 7.5 mm Number of attempts: 1 Airway Equipment and Method: Stylet and Oral airway Placement Confirmation: ETT inserted through vocal cords under direct vision,  positive ETCO2 and breath sounds checked- equal and bilateral Secured at: 22 cm Tube secured with: Tape Dental Injury: Teeth and Oropharynx as per pre-operative assessment

## 2018-03-08 NOTE — Op Note (Signed)
03/08/2018  11:11 AM  PATIENT:  [name]  72 y.o. male  Patient Care Team: Cyndi Bender, PA-C as PCP - General (Physician Assistant) Burnell Blanks, MD as PCP - Cardiology (Cardiology) Burnell Blanks, MD as Consulting Physician (Cardiology) Philemon Kingdom, MD as Consulting Physician (Internal Medicine) Grace Isaac, MD as Consulting Physician (Cardiothoracic Surgery) Leona Singleton, RN as Brandon, Sharon Mt, MD as Consulting Physician (General Surgery)  PRE-OPERATIVE DIAGNOSIS: 1. Colostomy status 2. History of diverticulitis  POST-OPERATIVE DIAGNOSIS:  Same  PROCEDURE:  Laparoscopic Hartmann's reversal 2. Repair/closure of parastomal hernia 3. Flexible sigmoidoscopy 4. Bilateral transversus abdominus plane blocks  SURGEON:  Sharon Mt. Dema Severin, MD  ASSISTANT: Michael Boston, MD  ANESTHESIA:   local and general  COUNTS:  Sponge, needle and instrument counts were reported correct x2 at the conclusion of the operation.  EBL: 100cc  DRAINS: None  SPECIMEN: Colostomy trim  COMPLICATIONS: None  FINDINGS: Parastomal   DISPOSITION: PACU in satisfactory condition  INDICATION: Mr. 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 Path returned benign.Since that time he has been doing reasonably well. His stoma has been productive green/brown stool.    He underwent colonoscopy with Dr. Cristina Gong 10/26/17. Scattered medium-mouthed diverticula throughout entire colon GGE 12/13/17 normal appearing rectal vault  He reports his last dose of Xarelto was 03/04/18  He returned to the office for follow up and desired colostomy closure. After careful counseling, he opted to pursue this. Please refer to H&P for details regarding this discussion.  DESCRIPTION: The patient was identified in preop holding and taken to  the OR where they were placed on the operating room table and SCDs were placed. General endotracheal anesthesia was induced without difficulty. The patient was then placed in lithotomy in [place]. Pressure points were padded and verified. The patient was then prepped and draped in the usual sterile fashion. A surgical timeout was performed indicating the correct patient, procedure, positioning and need for preoperative antibiotics.   Dr. Lovena Neighbours with urology then scrubbed for his portion of the procedure-cystoscopy/stents.  Please refer to his dictation for details regarding this portion of the procedure.  Following this, the patient was then prepped and draped in the standard sterile fashion for the abdominal portion of the operation.  Prior to doing this, a figure-of-eight suture was placed in the colostomy to prevent contamination during the case.  An OG tube was then placed and confirmed to be working by anesthesia.  This was to suction.  A small stab incision was made at Palmer's point and a Veress needle was introduced into the peritoneal cavity on the first attempt.  Location was confirmed with the saline drop test and aspiration.  The peritoneal cavity was then insufflated with CO2 to a maximum pressure of 15 mmHg.  A 5 mm trocar was then placed under direct visualization in the right hemiabdomen.  The laparoscope was then inserted and inspection revealed no evidence of trocar site complication or Veress needle insertion site complication.  There is needle was then removed from the peritoneal cavity.  Bilateral transversus abdominis plane blocks were then performed using a dilute mixture of Exparel and 0.25% Marcaine.  2 additional 5 mm trochars were placed-one in the right lower abdomen and one in the supraumbilical midline.  These were placed under direct visualization.  The patient was then positioned in Trendelenburg with right side down.  The  small bowel was reflected out of the pelvis.  The  Hartmann stump was identified in the pelvis.  There was a moderate sized parastomal hernia identified which was empty and contained on the colostomy.  There was no significant adhesive disease from his prior surgery.  An EEA sizer 25 mm in size was placed in the rectum and location of the stump confirmed.  The staple line was identified.  The segment of the rectum was grasped and gently mobilized using the Enseal device taking down the lateral stalks and staying within the avascular plane between the fascia propria of the rectum and the presacral fascia.  Care was taken not to injure the [facility] stump during this portion of the dissection.  After mobilizing approximately 3 to 4 cm, the rectum was inspected.  At the location of the staple line, the tinea clearly had already splayed, there are no noted diverticula, there is no appendices epiploic a, and with the use of the EEA sizer, measured 13 cm from the anal verge.  The rectal stump did accommodate a 29 mm EEA sizer well.  Given these findings, the size was selected.  Attention was then turned to mobilizing the colostomy.  A peristomal incision was created and carried down through the hernia sac until the peritoneal cavity was entered.  After the colostomy had been circumferentially dissected to mobilize, it was reinspected and noted to be completely free of any adhesions.  3 to 4 cm from the site of the stoma, a window was created in the mesentery and the intervening mesentery was ligated using the Enseal device.  A pursestring device was then placed on the colon at this level.  This was closed in a 2-0 Prolene passed through it.  The colostomy was then removed and passed off the specimen.  The pursestring device was removed and the pursestring was buttressed using 3-0 silk suture.  EEA sizers were passed and the 29 mm EEA stapler was opened.  The anvil was placed in the pursestring was tied.  The planned staple line on the descending colon was inspected and  noted to be well perfused with a palpable pulse in the associated mesentery at the marginal artery.  The colon was pink.  There were no diverticula within the planned staple line.  The descending colon this level was quite mobile and the anvil and colon reached the pubic symphysis externally.  This was then placed back into the abdomen.  An Alexis wound protector was placed at the colostomy site and a Placed on this.  The abdomen was then reinsufflated with CO2.  I then went below while my assistant to work from above.  I passed the 29 mm stapler under direct visualization of the rectal stump.  At the staple line, the spike was deployed.  My assistant then mated the components.  I then closed the stapler, held it for 60 seconds, and then fired.  Stapler was gently removed.  The donuts were inspected and noted to be complete.  My assistant then clamped the colon just proximal to the anastomosis and filled the pelvis with water.  I passed the flexible sigmoidoscope up the rectum to the level anastomosis and insufflated.  The anastomosis was airtight, pink, and when looking from the abdomen was under no tension.  The anastomosis was hemostatic.  The stump was then decompressed and the scope removed.  The 5 mm trochars were removed under direct visualization and hemostasis was noted at the port sites.  The [employer]  wound protector was then removed and all participants went "clean."  New gowns/gloves/equipment was utilized for all participants.  The surgical field was draped out with additional sterile drapes.  At the umbilical port site, a 0 Vicryl figure-of-eight suture was placed intraperitoneally via the colostomy incision as there was mesh at this location and a 5 mm hole through it from the port.  The hernia sac at the colostomy site was then removed using electrocautery.  The fascial edges were well-defined circumferentially.  The fascia was then closed with 2 running #1 PDS sutures at the site of the  colostomy.  This was tied and the fascia palpated and noted to be completely closed.  A ursestring suture was then placed using 2-0 Vicryl at the colostomy closure site and cinched down.  This was intentionally left somewhat loose so as to facilitate wicking.  Skin sites were closed using 4-0 Monocryl sub-particular suture.  Dermabond was placed over the skin incisions.  4 x 4's and an ABD were placed over the colostomy closure site.  The ureteral stents were then removed and intact.  The patient was then awakened from general anesthesia, extubated, and transferred to stretcher for transport to PACU in satisfactory condition

## 2018-03-08 NOTE — H&P (Signed)
CC: Here today for surgery  HPI: 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 Path returned benign. He recovered well from surgery and was restarted on his Xarelto. He was readmitted 06/14/17 with bleeding from likely the skin around the stoma. This was cauterized with silver nitrate and stopped. He was admitted for 1 day for observation and his hemoglobin remained stable. He was discharged 06/15/17. Since that time he has been doing reasonably well. His stoma has been productive green/brown stool. He denies fever/chills/nausea/vomiting. He denies abominal pain or issues with constipation.  He underwent colonoscopy with Dr. Cristina Gong 10/26/17 - Hartmann's pouch noted at 20cm length and mild diversion proctopathy. Scattered medium-mouthed diverticula throughout entire colon GGE 12/13/17 normal appearing rectal vault and distal sigmoid. One subcentimeter diverticulum was visible at junciton of mid and distal sigmoid  He reports his last dose of Xarelto was 03/04/18  PMH: DM (controlled on insulin), HTN, HLD, CAD (s/p stent 2001), AFib on Xarelto  PSH: Umbilical/ventral hernia repair with Goretex mesh remotely  FHx: Denies FHx of malignancy  Social: Denies use of tobacco/EtOH/drugs  ROS: A comprehensive 10 system review of systems was completed with the patient and pertinent findings as noted above.   Past Medical History:  Diagnosis Date  . AAA (abdominal aortic aneurysm) ([place])   . Allergy   . Atrial fibrillation [name]) October 2013   Xarelto started; duration unknown  . Cataract   . Chronic kidney disease    Stage III   . Coronary artery disease    post bare-metal stent to LAD in 2001  . Diabetes mellitus    type 2   . Hypertension   . Myocardial infarct ([place]) 2001  . Obesity     Past Surgical History:  Procedure Laterality Date  . CARDIAC CATHETERIZATION  2006   stable with  primarily nonobstructive disease  . CARDIOVERSION  03/27/2012   Procedure: CARDIOVERSION;  Surgeon: Carlena Bjornstad, MD;  Location: [facility] ENDOSCOPY;  Service: Cardiovascular;  Laterality: N/A;  . CORONARY ANGIOPLASTY WITH STENT PLACEMENT  2001   bare-metal stent to the LAD  . CYSTOSCOPY WITH STENT PLACEMENT Bilateral 06/03/2017   Procedure: CYSTOSCOPY WITH STENT PLACEMENT;  Surgeon: Festus Aloe, MD;  Location: [facility] ORS;  Service: Urology;  Laterality: Bilateral;  . HERNIA REPAIR  2006  . LAPAROSCOPIC SIGMOID COLECTOMY N/A 06/03/2017   Procedure: LAPAROSCOPIC  SIGMOID COLECTOMY, COLOSTOMY;  Surgeon: Ileana Roup, MD;  Location: [facility] ORS;  Service: General;  Laterality: N/A;  . SKIN GRAFT  1981    Family History  Problem Relation Age of Onset  . Heart disease Father   . Heart attack Father   . ALS Mother   . Diabetes Sister   . Hypertension Sister   . Heart failure Brother   . Stroke Neg Hx     Social:  reports that he quit smoking about 18 years ago. His smoking use included cigarettes, pipe, and cigars. He has a 20.00 pack-year smoking history. He has never used smokeless tobacco. He reports that he does not drink alcohol or use drugs.  Allergies: No Known Allergies  Medications: I have reviewed the patient's current medications.  Results for orders placed or performed during the hospital encounter of 03/08/18 (from the past 48 hour(s))  Glucose, capillary     Status: Abnormal   Collection Time: 03/08/18  5:51 AM  Result Value Ref Range   Glucose-Capillary 310 (H) 70 -  99 mg/dL   Comment 1 Notify RN    Comment 2 Document in Chart   Glucose, capillary     Status: Abnormal   Collection Time: 03/08/18  7:00 AM  Result Value Ref Range   Glucose-Capillary 201 (H) 70 - 99 mg/dL   Comment 1 Notify RN    Comment 2 Document in Chart     No results found.  ROS - all of the below systems have been reviewed with the patient and positives are indicated with bold text General:  chills, fever or night sweats Eyes: blurry vision or double vision ENT: epistaxis or sore throat Allergy/Immunology: itchy/watery eyes or nasal congestion Hematologic/Lymphatic: bleeding problems, blood clots or swollen lymph nodes Endocrine: temperature intolerance or unexpected weight changes Breast: new or changing breast lumps or nipple discharge Resp: cough, shortness of breath, or wheezing CV: chest pain or dyspnea on exertion GI: as per HPI GU: dysuria, trouble voiding, or hematuria MSK: joint pain or joint stiffness Neuro: TIA or stroke symptoms Derm: pruritus and skin lesion changes Psych: anxiety and depression  PE Blood pressure 135/85, pulse 93, temperature 97.7 F (36.5 C), temperature source Oral, resp. rate 16, height 6\' 2"  (1.88 m), weight 105.7 kg, SpO2 99 %. Constitutional: NAD; conversant; no deformities Eyes: Moist conjunctiva; no lid lag; anicteric; PERRL Neck: Trachea midline; no thyromegaly Lungs: Normal respiratory effort; no tactile fremitus CV: RRR; no palpable thrills; no pitting edema GI: Abd soft, NT/ND; LLQ stoma with associated parastomal hernia; no palpable hepatosplenomegaly MSK: Normal gait; no clubbing/cyanosis Psychiatric: Appropriate affect; alert and oriented x3 Lymphatic: No palpable cervical or axillary lymphadenopathy  Results for orders placed or performed during the hospital encounter of 03/08/18 (from the past 48 hour(s))  Glucose, capillary     Status: Abnormal   Collection Time: 03/08/18  5:51 AM  Result Value Ref Range   Glucose-Capillary 310 (H) 70 - 99 mg/dL   Comment 1 Notify RN    Comment 2 Document in Chart   Glucose, capillary     Status: Abnormal   Collection Time: 03/08/18  7:00 AM  Result Value Ref Range   Glucose-Capillary 201 (H) 70 - 99 mg/dL   Comment 1 Notify RN    Comment 2 Document in Chart     A/P: Mr. [name] is a very pleasant 72yo gentleman with hx of HTN, HLD, hypothyroidism, CAD, AFib on Xarelto - s/p lap  Hartmann's for significant size mismatch and thickening of his colon from his stricture - 06/03/17; readmitted 2/5 for skin site bleeding. Recovering well at this time. Pathology benign  -Colonoscopy noted medium diverticulosis scattered throughout colon -GGE clear -Cardiac clearance obtained - noted old scar but no further plans that would alter optimization per cardiology. He was instructed to hold his Xarelto at least 48hrs prior to surgery - been off since 10/26 -After going over everything and options including life with a colostomy, he has requested colostomy reversal. -We will plan laparoscopic vs open Hartmann's reversal, flexible sigmoidoscopy, possible takedown of splenic flexure, repair of parastomal hernia, cystoscopy/stents (by urology). -The anatomy and physiology of the GI tract was discussed at length with the patient with associated pictures. -The planned procedure, material risks (including, but not limited to, pain, bleeding, infection, scarring, need for blood transfusion, damage to surrounding structures- blood vessels/nerves/viscus/organs, damage to ureter, urine leak, leak from anastomosis, need for additional procedures, need for stoma which may be permanent, recurrence of hernia, recurrence of diverticulitis, pneumonia, heart attack, stroke, death) benefits and alternatives  to surgery were discussed at length. The patient's questions were answered to his satisfaction, he voiced understanding and elected to proceed with surgery. Additionally, we discussed typical postoperative expectations and the recovery process.  Sharon Mt. Dema Severin, M.D. General and Colorectal Surgery [facility]

## 2018-03-08 NOTE — H&P (Signed)
H&P Physician requesting consult: Dr. Dema Severin  Chief Complaint: Request for bilateral ureteral stent  History of Present Illness: 72 year old male status post laparoscopic Hartman's pouch for significant diverticular stricture on 06/03/2017 presents for stoma reversal.  Bilateral ureteral stent placement is requested.  Patient has no complaints today.  Past Medical History:  Diagnosis Date  . AAA (abdominal aortic aneurysm) ([place])   . Allergy   . Atrial fibrillation [facility]) October 2013   Xarelto started; duration unknown  . Cataract   . Chronic kidney disease    Stage III   . Coronary artery disease    post bare-metal stent to LAD in 2001  . Diabetes mellitus    type 2   . Hypertension   . Myocardial infarct ([place]) 2001  . Obesity    Past Surgical History:  Procedure Laterality Date  . CARDIAC CATHETERIZATION  2006   stable with primarily nonobstructive disease  . CARDIOVERSION  03/27/2012   Procedure: CARDIOVERSION;  Surgeon: Carlena Bjornstad, MD;  Location: [facility] ENDOSCOPY;  Service: Cardiovascular;  Laterality: N/A;  . CORONARY ANGIOPLASTY WITH STENT PLACEMENT  2001   bare-metal stent to the LAD  . CYSTOSCOPY WITH STENT PLACEMENT Bilateral 06/03/2017   Procedure: CYSTOSCOPY WITH STENT PLACEMENT;  Surgeon: Festus Aloe, MD;  Location: [facility] ORS;  Service: Urology;  Laterality: Bilateral;  . HERNIA REPAIR  2006  . LAPAROSCOPIC SIGMOID COLECTOMY N/A 06/03/2017   Procedure: LAPAROSCOPIC  SIGMOID COLECTOMY, COLOSTOMY;  Surgeon: Ileana Roup, MD;  Location: [facility] ORS;  Service: General;  Laterality: N/A;  . SKIN GRAFT  1981    Home Medications:  Medications Prior to Admission  Medication Sig Dispense Refill Last Dose  . atorvastatin (LIPITOR) 20 MG tablet Take 20 mg by mouth at bedtime.    03/07/2018 at Unknown time  . ferrous sulfate 325 (65 FE) MG tablet Take 325 mg by mouth 3 (three) times a week.   03/07/2018 at Unknown time  . Insulin Isophane & Regular Human (HUMULIN 70/30  KWIKPEN) (70-30) 100 UNIT/ML PEN INJECT SUBCUTANEOUSLY 25  UNITS BEFORE BREAKFAST AND  20 UNITS BEFORE DINNER (Patient taking differently: Inject 20-25 Units into the skin See admin instructions. INJECT SUBCUTANEOUSLY 25  UNITS BEFORE BREAKFAST AND  20 UNITS BEFORE DINNER) 45 mL 3 Taking  . metFORMIN (GLUCOPHAGE) 500 MG tablet Take 1 tablet (500 mg total) by mouth daily with breakfast. 90 tablet 3 03/07/2018 at Unknown time  . metoprolol tartrate (LOPRESSOR) 25 MG tablet Take 12.5 mg by mouth 2 (two) times daily.    03/08/2018 at 0400  . nitroGLYCERIN (NITROSTAT) 0.4 MG SL tablet Place 0.4 mg under the tongue every 5 (five) minutes as needed for chest pain.    Taking  . omeprazole (PRILOSEC) 20 MG capsule Take 20 mg by mouth daily as needed (for acid reflux/indigestion).    Taking  . Probiotic Product (PROBIOTIC-10 PO) Take 1 capsule by mouth 3 (three) times a week.    Taking  . ramipril (ALTACE) 10 MG capsule Take 10 mg by mouth daily.   Taking  . rivaroxaban (XARELTO) 20 MG TABS tablet Take 1 tablet (20 mg total) by mouth daily. 90 tablet 1 03/04/2018  . TRULICITY 1.5 NT/6.1WE SOPN INJECT SUBCUTANEOUSLY 1.5  MG WEEKLY (Patient taking differently: Inject 1.5 mg into the skin every Sunday. ) 6 mL 1 Taking  . RELION PEN NEEDLES 32G X 4 MM MISC USE TWO TIMES A DAY AS ADVISED 100 each 7 Taking   Allergies: No Known Allergies  Family History  Problem Relation Age of Onset  . Heart disease Father   . Heart attack Father   . ALS Mother   . Diabetes Sister   . Hypertension Sister   . Heart failure Brother   . Stroke Neg Hx    Social History:  reports that he quit smoking about 18 years ago. His smoking use included cigarettes, pipe, and cigars. He has a 20.00 pack-year smoking history. He has never used smokeless tobacco. He reports that he does not drink alcohol or use drugs.  ROS: A complete review of systems was performed.  All systems are negative except for pertinent findings as  noted. ROS   Physical Exam:  Vital signs in last 24 hours: Temp:  [97.7 F (36.5 C)] 97.7 F (36.5 C) (10/30 0548) Pulse Rate:  [93] 93 (10/30 0548) Resp:  [16] 16 (10/30 0548) BP: (135)/(85) 135/85 (10/30 0548) SpO2:  [99 %] 99 % (10/30 0548) Weight:  [105.7 kg] 105.7 kg (10/30 0626) General:  Alert and oriented, No acute distress HEENT: Normocephalic, atraumatic Neck: No JVD or lymphadenopathy Cardiovascular: Regular rate and rhythm Lungs: Regular rate and effort Abdomen: Soft, nontender, nondistended, no abdominal masses Back: No CVA tenderness Extremities: No edema Neurologic: Grossly intact  Laboratory Data:  Results for orders placed or performed during the hospital encounter of 03/08/18 (from the past 24 hour(s))  Glucose, capillary     Status: Abnormal   Collection Time: 03/08/18  5:51 AM  Result Value Ref Range   Glucose-Capillary 310 (H) 70 - 99 mg/dL   Comment 1 Notify RN    Comment 2 Document in Chart   Glucose, capillary     Status: Abnormal   Collection Time: 03/08/18  7:00 AM  Result Value Ref Range   Glucose-Capillary 201 (H) 70 - 99 mg/dL   Comment 1 Notify RN    Comment 2 Document in Chart    No results found for this or any previous visit (from the past 240 hour(s)). Creatinine: Recent Labs    03/02/18 1151  CREATININE 1.46*    Impression/Assessment:  Diverticular stricture  Plan:  Proceed with bilateral ureteral stent placement prior to his reversal.  See as discussed risk which are low but include bleeding, infection, injury to surrounding structures including ureteral injury.  Marton Redwood, III 03/08/2018, 7:17 AM

## 2018-03-08 NOTE — Op Note (Signed)
Operative Note  Preoperative diagnosis:  1.  Diverticular stricture  Post operative diagnosis: 1.  Diverticular stricture  Procedure(s): 1.  Cystoscopy with bilateral retrograde pyelogram and bilateral open-ended ureteral catheter placement  Surgeon: Link Snuffer, MD  Assistants: None  Anesthesia: General  Complications: None immediate  EBL: Minimal  Specimens: 1.  None  Drains/Catheters: 1.  Bilateral open-ended ureteral catheters 2.  Foley catheter  Intraoperative findings: 1.  Normal urethra and bladder 2.  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  Intraoperative stent placement was requested.  Description of procedure:  The patient was identified and consent was obtained.  The patient was taken to the operating room and placed in the supine position.  The patient was placed under general anesthesia.  Perioperative antibiotics were administered.  The patient was placed in dorsal lithotomy.  Patient was prepped and draped in a standard sterile fashion and a timeout was performed.  A 21 French rigid cystoscope was advanced into the urethra and into the bladder.  The left distal most portion of the ureter was cannulated with an open-ended ureteral catheter.  Retrograde pyelogram was performed with the findings noted above.  A sensor wire was then advanced up to the kidney under fluoroscopic guidance.  The open-ended ureteral catheter was advanced over the wire up to the kidney and then the scope and wire were withdrawn keeping the open-ended ureteral catheter in place.   The right distal most portion of the ureter was cannulated with an open-ended ureteral catheter.  Retrograde pyelogram was performed with the findings noted above.  A sensor wire was then advanced up to the kidney under  fluoroscopic guidance.  The open-ended ureteral catheter was advanced over the wire up to the kidney and then the scope and wire were withdrawn keeping the open-ended ureteral catheter in place.Foley catheter was placed and both open-ended ureteral catheters were secured and threaded through into a drainage bag.  This concluded my portion of the operation.  Patient tolerated procedure well and the case was handed over to general surgery.  Plan: Per general surgery.  Urology will be available as needed.

## 2018-03-08 NOTE — Anesthesia Postprocedure Evaluation (Signed)
Anesthesia Post Note  Patient: [name]  Procedure(s) Performed: LAPAROSCOPIC HARTMANNS REVERSAL (N/A Abdomen) PARASTOMAL HERNIA REPAIR (N/A Abdomen) FLEXIBLE SIGMOIDOSCOPY (N/A Rectum) CYSTOSCOPY WITH RETROGRADE AND BILATERAL URETERAL STENT PLACEMENT (Bilateral Ureter)     Patient location during evaluation: PACU Anesthesia Type: General Level of consciousness: awake and alert Pain management: pain level controlled Vital Signs Assessment: post-procedure vital signs reviewed and stable Respiratory status: spontaneous breathing, nonlabored ventilation, respiratory function stable and patient connected to nasal cannula oxygen Cardiovascular status: blood pressure returned to baseline and stable Postop Assessment: no apparent nausea or vomiting Anesthetic complications: no    Last Vitals:  Vitals:   03/08/18 1601 03/08/18 1634  BP: 115/80 108/69  Pulse: 92 73  Resp: 16 14  Temp: 36.5 C 36.4 C  SpO2: 98% 100%    Last Pain:  Vitals:   03/08/18 1634  TempSrc: Oral  PainSc:                  [name] P Welles [place]

## 2018-03-09 ENCOUNTER — Encounter (HOSPITAL_COMMUNITY): Payer: Self-pay | Admitting: Surgery

## 2018-03-09 ENCOUNTER — Other Ambulatory Visit: Payer: Self-pay | Admitting: Cardiovascular Disease

## 2018-03-09 LAB — CBC
HEMATOCRIT: 34.9 % — AB (ref 39.0–52.0)
Hemoglobin: 11.2 g/dL — ABNORMAL LOW (ref 13.0–17.0)
MCH: 32.7 pg (ref 26.0–34.0)
MCHC: 32.1 g/dL (ref 30.0–36.0)
MCV: 102 fL — ABNORMAL HIGH (ref 80.0–100.0)
Platelets: 147 10*3/uL — ABNORMAL LOW (ref 150–400)
RBC: 3.42 MIL/uL — ABNORMAL LOW (ref 4.22–5.81)
RDW: 13.7 % (ref 11.5–15.5)
WBC: 10.1 10*3/uL (ref 4.0–10.5)
nRBC: 0 % (ref 0.0–0.2)

## 2018-03-09 LAB — GLUCOSE, CAPILLARY
Glucose-Capillary: 164 mg/dL — ABNORMAL HIGH (ref 70–99)
Glucose-Capillary: 149 mg/dL — ABNORMAL HIGH (ref 70–99)
Glucose-Capillary: 161 mg/dL — ABNORMAL HIGH (ref 70–99)
Glucose-Capillary: 162 mg/dL — ABNORMAL HIGH (ref 70–99)

## 2018-03-09 LAB — BASIC METABOLIC PANEL
Anion gap: 9 (ref 5–15)
BUN: 20 mg/dL (ref 8–23)
CALCIUM: 8.1 mg/dL — AB (ref 8.9–10.3)
CO2: 20 mmol/L — AB (ref 22–32)
CREATININE: 1.59 mg/dL — AB (ref 0.61–1.24)
Chloride: 107 mmol/L (ref 98–111)
GFR calc non Af Amer: 42 mL/min — ABNORMAL LOW (ref >60)
GFR, EST AFRICAN AMERICAN: 48 mL/min — AB (ref >60)
Glucose, Bld: 184 mg/dL — ABNORMAL HIGH (ref 70–99)
Potassium: 4.4 mmol/L (ref 3.5–5.1)
Sodium: 136 mmol/L (ref 135–145)

## 2018-03-09 NOTE — Evaluation (Signed)
Physical Therapy Evaluation Patient Details Name: [name] MRN: [id number] DOB: 06-27-1945 Today's Date: 03/09/2018   History of Present Illness  [name] is a very pleasant 72yo gentleman with hx of HTN, HLD, hypothyroidism, CAD, AFib on Xarelto - s/p lap Hartmann's for significant size mismatch and thickening of his colon from his stricture - 06/03/17;   s/p Cystoscopy with bilateral retrograde pyelogram and bilateral open-ended ureteral catheter placement per Urology, and  s/p Laparoscopic Hartmann's  reversal  and   repair of parastomal hernia            Clinical Impression  Patient evaluated by Physical Therapy with no further acute PT needs identified. All education has been completed and the patient has no further questions. Pt doing quite well this am, amb in hallway last night  With nursing staff and again today using RW, supervision for safety and IV pole management--cues for trunk/hip extension and correct RW position from self-->reviewed cues/technique with pt wife as well (pt should be ok to amb with wife);   See below for any follow-up Physical Therapy or equipment needs. PT is signing off. Thank you for this referral.     Follow Up Recommendations No PT follow up    Equipment Recommendations  None recommended by PT    Recommendations for Other Services       Precautions / Restrictions        Mobility  Bed Mobility               General bed mobility comments: pt received in chair  Transfers Overall transfer level: Needs assistance Equipment used: Rolling walker (2 wheeled) Transfers: Sit to/from Stand Sit to Stand: Supervision;Modified independent (Device/Increase time)         General transfer comment: inital cues for hand placement  Ambulation/Gait Ambulation/Gait assistance: Supervision;Min guard Gait Distance (Feet): 380 Feet Assistive device: Rolling walker (2 wheeled) Gait Pattern/deviations: Step-through pattern;Decreased stride length     General Gait Details: cues for trunk extension and RW position from self  Stairs            Wheelchair Mobility    Modified Rankin (Stroke Patients Only)       Balance Overall balance assessment: Needs assistance Sitting-balance support: Feet supported;No upper extremity supported Sitting balance-Leahy Scale: Good       Standing balance-Leahy Scale: Fair                               Pertinent Vitals/Pain Pain Assessment: 0-10 Pain Score: 2  Pain Location: abd with movment Pain Descriptors / Indicators: Sore Pain Intervention(s): Monitored during session;Limited activity within patient's tolerance    Home Living Family/patient expects to be discharged to:: Private residence Living Arrangements: Spouse/significant other Available Help at Discharge: Family Type of Home: House Home Access: Stairs to enter   Technical brewer of Steps: 2 Home Layout: One level Home Equipment: Environmental consultant - 2 wheels      Prior Function Level of Independence: Independent         Comments: very active at baseline     Hand Dominance        Extremity/Trunk Assessment   Upper Extremity Assessment Upper Extremity Assessment: Defer to OT evaluation    Lower Extremity Assessment Lower Extremity Assessment: Overall WFL for tasks assessed       Communication   Communication: No difficulties  Cognition Arousal/Alertness: Awake/alert Behavior During Therapy: WFL for tasks assessed/performed Overall Cognitive  Status: Within Functional Limits for tasks assessed                                        General Comments      Exercises     Assessment/Plan    PT Assessment Patent does not need any further PT services  PT Problem List         PT Treatment Interventions      PT Goals (Current goals can be found in the Care Plan section)  Acute Rehab PT Goals Patient Stated Goal: home soon PT Goal Formulation: All assessment and  education complete, DC therapy    Frequency     Barriers to discharge        Co-evaluation               AM-PAC PT "6 Clicks" Daily Activity  Outcome Measure Difficulty turning over in bed (including adjusting bedclothes, sheets and blankets)?: A Lot Difficulty moving from lying on back to sitting on the side of the bed? : A Lot Difficulty sitting down on and standing up from a chair with arms (e.g., wheelchair, bedside commode, etc,.)?: A Little Help needed moving to and from a bed to chair (including a wheelchair)?: None Help needed walking in hospital room?: A Little Help needed climbing 3-5 steps with a railing? : A Little 6 Click Score: 17    End of Session   Activity Tolerance: Patient tolerated treatment well Patient left: in chair;with call bell/phone within reach;with family/visitor present   PT Visit Diagnosis: Difficulty in walking, not elsewhere classified (R26.2)    Time: 6294-7654 PT Time Calculation (min) (ACUTE ONLY): 17 min   Charges:   PT Evaluation $PT Eval Low Complexity: 1 Low          Kenyon Ana, PT  Pager: [phone] Acute Rehab Dept [facility]): [phone]   03/09/2018   [facility] 03/09/2018, 12:00 PM

## 2018-03-09 NOTE — Evaluation (Signed)
Occupational Therapy Evaluation Patient Details Name: [name] MRN: [id number] DOB: 1945-12-09 Today's Date: 03/09/2018    History of Present Illness [name] is a very pleasant 72yo gentleman with hx of HTN, HLD, hypothyroidism, CAD, AFib on Xarelto - s/p lap Hartmann's for significant size mismatch and thickening of his colon from his stricture - 06/03/17;   s/p Cystoscopy with bilateral retrograde pyelogram and bilateral open-ended ureteral catheter placement per Urology, and  s/p Laparoscopic Hartmann's  reversal  and   repair of parastomal hernia             Clinical Impression   Pt was admitted for the above. All education was completed. Pt limited for adls by pain; he will have wife assist.  No further OT needs.    Follow Up Recommendations  Supervision - Intermittent    Equipment Recommendations  None recommended by OT    Recommendations for Other Services       Precautions / Restrictions Precautions Precautions: Fall Restrictions Weight Bearing Restrictions: No      Mobility Bed Mobility               General bed mobility comments: oob:  reviewed sidelying<>sit  Transfers Overall transfer level: Needs assistance Equipment used: Rolling walker (2 wheeled) Transfers: Sit to/from Stand Sit to Stand: Supervision         General transfer comment: for safety    Balance Overall balance assessment: Needs assistance Sitting-balance support: Feet supported;No upper extremity supported Sitting balance-Leahy Scale: Good       Standing balance-Leahy Scale: Fair                             ADL either performed or assessed with clinical judgement   ADL Overall ADL's : Needs assistance/impaired     Grooming: Supervision/safety;Standing   Upper Body Bathing: Set up   Lower Body Bathing: Minimal assistance;Moderate assistance   Upper Body Dressing : Set up   Lower Body Dressing: Maximal assistance   Toilet Transfer:  Supervision/safety;Min guard;Ambulation;RW   Toileting- Clothing Manipulation and Hygiene: Supervision/safety;Sit to/from stand         General ADL Comments: safe use of RW. Wife will assist at home: pt not interested in AE. Educated to work within pain Magazine features editor      Pertinent Vitals/Pain Pain Assessment: 0-10 Pain Score: 2  Pain Location: abd with movement Pain Descriptors / Indicators: Sore Pain Intervention(s): Monitored during session;Limited activity within patient's tolerance     Hand Dominance     Extremity/Trunk Assessment Upper Extremity Assessment Upper Extremity Assessment: Overall WFL for tasks assessed   Lower Extremity Assessment Lower Extremity Assessment: Overall WFL for tasks assessed       Communication Communication Communication: No difficulties   Cognition Arousal/Alertness: Awake/alert Behavior During Therapy: WFL for tasks assessed/performed Overall Cognitive Status: Within Functional Limits for tasks assessed                                     General Comments       Exercises     Shoulder Instructions      Home Living Family/patient expects to be discharged to:: Private residence Living Arrangements: Spouse/significant other Available Help at Discharge: Family Type of Home: House Home Access: Stairs  to enter Entrance Stairs-Number of Steps: 2   Home Layout: One level     Bathroom Shower/Tub: Occupational psychologist: Standard     Home Equipment: Environmental consultant - 2 wheels          Prior Functioning/Environment Level of Independence: Independent        Comments: very active at baseline        OT Problem List:        OT Treatment/Interventions:      OT Goals(Current goals can be found in the care plan section) Acute Rehab OT Goals Patient Stated Goal: home soon OT Goal Formulation: All assessment and education complete, DC therapy  OT Frequency:      Barriers to D/C:            Co-evaluation              AM-PAC PT "6 Clicks" Daily Activity     Outcome Measure Help from another person eating meals?: None Help from another person taking care of personal grooming?: A Little Help from another person toileting, which includes using toliet, bedpan, or urinal?: A Little Help from another person bathing (including washing, rinsing, drying)?: A Lot Help from another person to put on and taking off regular upper body clothing?: A Little Help from another person to put on and taking off regular lower body clothing?: A Lot 6 Click Score: 17   End of Session    Activity Tolerance: Patient tolerated treatment well Patient left: in chair;with call bell/phone within reach;with family/visitor present  OT Visit Diagnosis: Muscle weakness (generalized) (M62.81)                Time: 5573-2202 OT Time Calculation (min): 20 min Charges:  OT General Charges $OT Visit: 1 Visit OT Evaluation $OT Eval Low Complexity: 1 Low  Lesle Chris, OTR/L Acute Rehabilitation Services [phone] [facility] pager [phone] office 03/09/2018  [place] 03/09/2018, 3:16 PM

## 2018-03-09 NOTE — Progress Notes (Signed)
Subjective No acute events. Feeling well. Soreness at closure site, none elsewhere. Has been up walking around. Having small amount of flatus; reports 1 bm but was primarily old blood. Tolerating clears without n/v.  Objective: Vital signs in last 24 hours: Temp:  [97.5 F (36.4 C)-98 F (36.7 C)] 98 F (36.7 C) (10/31 0509) Pulse Rate:  [73-97] 81 (10/31 0509) Resp:  [13-20] 20 (10/31 0509) BP: (94-133)/(68-94) 94/68 (10/31 0509) SpO2:  [96 %-100 %] 97 % (10/31 0509) Last BM Date: 03/08/18  Intake/Output from previous day: 10/30 0701 - 10/31 0700 In: 2110 [P.O.:240; I.V.:1770; IV Piggyback:100] Out: 1890 [XHBZJ:6967; Blood:100] Intake/Output this shift: Total I/O In: -  Out: 350 [Urine:350]  Gen: NAD, comfortable CV: RRR Pulm: Normal work of breathing Abd: Soft, no significant tenderness anywhere; abdomen is ND; dressings in place and dry. Ext: SCDs in place  Lab Results: CBC  Recent Labs    03/09/18 0449  WBC 10.1  HGB 11.2*  HCT 34.9*  PLT 147*   BMET Recent Labs    03/09/18 0449  NA 136  K 4.4  CL 107  CO2 20*  GLUCOSE 184*  BUN 20  CREATININE 1.59*  CALCIUM 8.1*   PT/INR No results for input(s): LABPROT, INR in the last 72 hours. ABG No results for input(s): PHART, HCO3 in the last 72 hours.  Invalid input(s): PCO2, PO2  Studies/Results:  Anti-infectives: Anti-infectives (From admission, onward)   Start     Dose/Rate Route Frequency Ordered Stop   03/08/18 0600  cefoTEtan (CEFOTAN) 2 g in sodium chloride 0.9 % 100 mL IVPB     2 g 200 mL/hr over 30 Minutes Intravenous On call to O.R. 03/08/18 0539 03/08/18 0745       Assessment/Plan: Patient Active Problem List   Diagnosis Date Noted  . S/P colostomy takedown 03/08/2018  . Diverticular stricture s/p colectomy/colostomy 06/03/2017 06/15/2017  . Colostomy in place [facility]) 06/15/2017  . Bleeding from colostomy ([place]) 06/14/2017  . GIB (gastrointestinal bleeding) 06/14/2017  . Chronic  anticoagulation 06/14/2017  . Acute blood loss anemia 06/14/2017  . A-fib ([place]) 03/28/2017  . Diverticulitis 03/28/2017  . History of hearing problem 03/28/2017  . H/O cataract 03/28/2017  . Uncontrolled type 2 diabetes mellitus with circulatory disorder 04/15/2014  . 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 Hyperlipidemia 07/06/2008  . HYPERTENSION, BENIGN 07/06/2008  . CAD, NATIVE VESSEL 07/06/2008   s/p Procedure(s): LAPAROSCOPIC HARTMANNS REVERSAL PARASTOMAL HERNIA REPAIR FLEXIBLE SIGMOIDOSCOPY CYSTOSCOPY WITH RETROGRADE AND BILATERAL URETERAL STENT PLACEMENT 03/08/2018  -Advance to bariatric full liquids -Continue entereg until reliable bowel function - no substantial bm yet -Ambulate 5x/day -IS 10x/hr while awake -Will remove packing tomorrow from stoma site -Repeat labs tomorrow - if hgb stable, will consider heparin gtt without boluses and monitor x24hrs - if hgb remains stable, will discontinue, start oral anticoagulant  -PPx: SQH, SCDs   LOS: 1 day   Sharon Mt. Dema Severin, M.D. General and Colorectal Surgery [facility]

## 2018-03-10 ENCOUNTER — Encounter (HOSPITAL_COMMUNITY): Payer: Self-pay | Admitting: *Deleted

## 2018-03-10 LAB — HEPARIN LEVEL (UNFRACTIONATED)
HEPARIN UNFRACTIONATED: 0.25 [IU]/mL — AB (ref 0.30–0.70)
Heparin Unfractionated: <0.1 IU/mL — ABNORMAL LOW (ref 0.30–0.70)

## 2018-03-10 LAB — GLUCOSE, CAPILLARY
GLUCOSE-CAPILLARY: 160 mg/dL — AB (ref 70–99)
Glucose-Capillary: 190 mg/dL — ABNORMAL HIGH (ref 70–99)
Glucose-Capillary: 164 mg/dL — ABNORMAL HIGH (ref 70–99)
Glucose-Capillary: 117 mg/dL — ABNORMAL HIGH (ref 70–99)

## 2018-03-10 LAB — CBC
HCT: 36.4 % — ABNORMAL LOW (ref 39.0–52.0)
Hemoglobin: 11.8 g/dL — ABNORMAL LOW (ref 13.0–17.0)
MCH: 32.7 pg (ref 26.0–34.0)
MCHC: 32.4 g/dL (ref 30.0–36.0)
MCV: 100.8 fL — ABNORMAL HIGH (ref 80.0–100.0)
PLATELETS: 147 10*3/uL — AB (ref 150–400)
RBC: 3.61 MIL/uL — ABNORMAL LOW (ref 4.22–5.81)
RDW: 13.8 % (ref 11.5–15.5)
WBC: 7.3 10*3/uL (ref 4.0–10.5)
nRBC: 0 % (ref 0.0–0.2)

## 2018-03-10 LAB — BASIC METABOLIC PANEL
Anion gap: 7 (ref 5–15)
BUN: 19 mg/dL (ref 8–23)
CALCIUM: 8.3 mg/dL — AB (ref 8.9–10.3)
CO2: 24 mmol/L (ref 22–32)
CREATININE: 1.52 mg/dL — AB (ref 0.61–1.24)
Chloride: 104 mmol/L (ref 98–111)
GFR calc Af Amer: 51 mL/min — ABNORMAL LOW (ref >60)
GFR, EST NON AFRICAN AMERICAN: 44 mL/min — AB (ref >60)
Glucose, Bld: 150 mg/dL — ABNORMAL HIGH (ref 70–99)
Potassium: 4.3 mmol/L (ref 3.5–5.1)
SODIUM: 135 mmol/L (ref 135–145)

## 2018-03-10 LAB — APTT: aPTT: 32 seconds (ref 24–36)

## 2018-03-10 MED ORDER — METFORMIN HCL 500 MG PO TABS
500.0000 mg | ORAL_TABLET | Freq: Every day | ORAL | Status: DC
  Administered 2018-03-10 – 2018-03-12 (×3): 500 mg via ORAL
  Filled 2018-03-10 (×3): qty 1

## 2018-03-10 MED ORDER — HEPARIN (PORCINE) IN NACL 100-0.45 UNIT/ML-% IJ SOLN
1200.0000 [IU]/h | INTRAMUSCULAR | Status: DC
  Administered 2018-03-10: 1200 [IU]/h via INTRAVENOUS
  Filled 2018-03-10: qty 250

## 2018-03-10 NOTE — Progress Notes (Signed)
Subjective No acute events. Feeling well. Soreness at closure site, none elsewhere. Has been up walking around. Passing flatus and had bowel movements x2 - blood tinged. Tolerating fulls without n/v.  Objective: Vital signs in last 24 hours: Temp:  [98.1 F (36.7 C)-98.8 F (37.1 C)] 98.1 F (36.7 C) (11/01 0533) Pulse Rate:  [70-85] 70 (11/01 0533) Resp:  [16-18] 16 (11/01 0533) BP: (108-121)/(65-77) 121/77 (11/01 0533) SpO2:  [94 %-98 %] 95 % (11/01 0533) Weight:  [103.9 kg] 103.9 kg (11/01 0533) Last BM Date: 03/09/18  Intake/Output from previous day: 10/31 0701 - 11/01 0700 In: 3940.9 [P.O.:1020; I.V.:2920.9] Out: 3212 [Urine:4125] Intake/Output this shift: No intake/output data recorded.  Gen: NAD, comfortable CV: RRR Pulm: Normal work of breathing Abd: Soft, no significant tenderness anywhere; abdomen is ND; ostomy dressing removed; wicked with single 4x4. Looks clean/dry - no bleeding Ext: SCDs in place  Lab Results: CBC  Recent Labs    03/09/18 0449 03/10/18 0457  WBC 10.1 7.3  HGB 11.2* 11.8*  HCT 34.9* 36.4*  PLT 147* 147*   BMET Recent Labs    03/09/18 0449 03/10/18 0457  NA 136 135  K 4.4 4.3  CL 107 104  CO2 20* 24  GLUCOSE 184* 150*  BUN 20 19  CREATININE 1.59* 1.52*  CALCIUM 8.1* 8.3*   PT/INR No results for input(s): LABPROT, INR in the last 72 hours. ABG No results for input(s): PHART, HCO3 in the last 72 hours.  Invalid input(s): PCO2, PO2  Studies/Results:  Anti-infectives: Anti-infectives (From admission, onward)   Start     Dose/Rate Route Frequency Ordered Stop   03/08/18 0600  cefoTEtan (CEFOTAN) 2 g in sodium chloride 0.9 % 100 mL IVPB     2 g 200 mL/hr over 30 Minutes Intravenous On call to O.R. 03/08/18 0539 03/08/18 0745       Assessment/Plan: Patient Active Problem List   Diagnosis Date Noted  . S/P colostomy takedown 03/08/2018  . Diverticular stricture s/p colectomy/colostomy 06/03/2017 06/15/2017  . Colostomy  in place [facility]) 06/15/2017  . Bleeding from colostomy ([place]) 06/14/2017  . GIB (gastrointestinal bleeding) 06/14/2017  . Chronic anticoagulation 06/14/2017  . Acute blood loss anemia 06/14/2017  . A-fib ([place]) 03/28/2017  . Diverticulitis 03/28/2017  . History of hearing problem 03/28/2017  . H/O cataract 03/28/2017  . Uncontrolled type 2 diabetes mellitus with circulatory disorder 04/15/2014  . 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 Hyperlipidemia 07/06/2008  . HYPERTENSION, BENIGN 07/06/2008  . CAD, NATIVE VESSEL 07/06/2008   s/p Procedure(s): LAPAROSCOPIC HARTMANNS REVERSAL PARASTOMAL HERNIA REPAIR FLEXIBLE SIGMOIDOSCOPY CYSTOSCOPY WITH RETROGRADE AND BILATERAL URETERAL STENT PLACEMENT 03/08/2018  -Advance to carb controlled diet -Will d/c entereg today -Ambulate 5x/day -IS 10x/hr while awake -Ok to bathe normally and get soap/water in stoma takedown site. Change dressing twice daily -Monitor hgb; will consider starting hep gtt this afternoon if doing well - no significant bloody BMs - no boluses of heparin; if hgb stable on heparin gtt x24hrs, will transition back to PO anticoagulation.  -PPx: SQH, SCDs   LOS: 2 days   Sharon Mt. Dema Severin, M.D. General and Colorectal Surgery [facility]

## 2018-03-10 NOTE — Progress Notes (Signed)
ANTICOAGULATION CONSULT NOTE - Initial Consult  Pharmacy Consult for Heparin Indication: atrial fibrillation  No Known Allergies  Patient Measurements: Height: 6\' 2"  (188 cm) Weight: 229 lb 0.9 oz (103.9 kg) IBW/kg (Calculated) : 82.2 HEPARIN DW (KG): 103.6   Vital Signs: Temp: 98.1 F (36.7 C) (11/01 0533) Temp Source: Oral (11/01 0533) BP: 111/83 (11/01 1254) Pulse Rate: 90 (11/01 1254)  Labs: Recent Labs    03/09/18 0449 03/10/18 0457  HGB 11.2* 11.8*  HCT 34.9* 36.4*  PLT 147* 147*  CREATININE 1.59* 1.52*    Estimated Creatinine Clearance: 56.5 mL/min (A) (by C-G formula based on SCr of 1.52 mg/dL (H)).   Medical History: Past Medical History:  Diagnosis Date  . AAA (abdominal aortic aneurysm) ([place])   . Allergy   . Atrial fibrillation [facility]) October 2013   Xarelto started; duration unknown  . Cataract   . Chronic kidney disease    Stage III   . Coronary artery disease    post bare-metal stent to LAD in 2001  . Diabetes mellitus    type 2   . Hypertension   . Myocardial infarct ([place]) 2001  . Obesity     Medications:  Xarelto 20mg  PTA- LD 10/26  Assessment: 72 yo M on Xarelto for hx Afib.  This was held for planned procedure on 10/26.  He is s/p reversal of colostomy and placement of bilateral ureteral catheter on 10/30.  Hg is stable post-op on SQ heparin (LD 0537 11/1).  Pharmacy consulted to start IV heparin infusion today.  03/10/2018:  Baseline heparin level: <0.1  CBC- Hg low at 11.8 & pltc 147, but stable x24h  Some blood-tinged stool this morning   Goal of Therapy:  Heparin level 0.3-0.7 units/ml aPTT 66-102 seconds Monitor platelets by anticoagulation protocol: Yes   Plan:  Begin heparin infusion at 1200 units/hr.  NO BOLUS due to recent surgery.   Check heparin level in 8h Daily Heparin level & CBC while on heparin F/U plans to resume Xarelto  Biagio Borg 03/10/2018,1:08 PM

## 2018-03-10 NOTE — Progress Notes (Signed)
Pharmacy Brief Note - Alvimopan (Entereg)  The standing order set for alvimopan (Entereg) now includes an automatic order to discontinue the drug after the patient has had a bowel movement.  The change was approved by the [place] and the Medical Executive Committee.    This patient has had a bowel movement documented by nursing.  Therefore, alvimopan has been discontinued.  If there are questions, please contact the pharmacy at [phone].  Thank you- Biagio Borg, [facility] 03/10/2018 10:59 AM

## 2018-03-11 LAB — CBC
HCT: 34.8 % — ABNORMAL LOW (ref 39.0–52.0)
Hemoglobin: 11.2 g/dL — ABNORMAL LOW (ref 13.0–17.0)
MCH: 32.9 pg (ref 26.0–34.0)
MCHC: 32.2 g/dL (ref 30.0–36.0)
MCV: 102.4 fL — AB (ref 80.0–100.0)
PLATELETS: 140 10*3/uL — AB (ref 150–400)
RBC: 3.4 MIL/uL — ABNORMAL LOW (ref 4.22–5.81)
RDW: 13.6 % (ref 11.5–15.5)
WBC: 6.4 10*3/uL (ref 4.0–10.5)
nRBC: 0 % (ref 0.0–0.2)

## 2018-03-11 LAB — BASIC METABOLIC PANEL
Anion gap: 8 (ref 5–15)
BUN: 17 mg/dL (ref 8–23)
CALCIUM: 8.3 mg/dL — AB (ref 8.9–10.3)
CO2: 23 mmol/L (ref 22–32)
CREATININE: 1.4 mg/dL — AB (ref 0.61–1.24)
Chloride: 107 mmol/L (ref 98–111)
GFR calc Af Amer: 56 mL/min — ABNORMAL LOW (ref >60)
GFR, EST NON AFRICAN AMERICAN: 49 mL/min — AB (ref >60)
GLUCOSE: 163 mg/dL — AB (ref 70–99)
Potassium: 3.9 mmol/L (ref 3.5–5.1)
SODIUM: 138 mmol/L (ref 135–145)

## 2018-03-11 LAB — GLUCOSE, CAPILLARY
GLUCOSE-CAPILLARY: 171 mg/dL — AB (ref 70–99)
Glucose-Capillary: 135 mg/dL — ABNORMAL HIGH (ref 70–99)
Glucose-Capillary: 220 mg/dL — ABNORMAL HIGH (ref 70–99)
Glucose-Capillary: 167 mg/dL — ABNORMAL HIGH (ref 70–99)

## 2018-03-11 MED ORDER — HEPARIN (PORCINE) IN NACL 100-0.45 UNIT/ML-% IJ SOLN
1400.0000 [IU]/h | INTRAMUSCULAR | Status: AC
  Filled 2018-03-11: qty 250

## 2018-03-11 MED ORDER — TRAMADOL HCL 50 MG PO TABS: 50.0000 mg | ORAL_TABLET | Freq: Four times a day (QID) | ORAL | 0 refills | Status: AC | PRN

## 2018-03-11 MED ORDER — RIVAROXABAN 20 MG PO TABS
20.0000 mg | ORAL_TABLET | Freq: Every day | ORAL | Status: DC
  Administered 2018-03-11: 20 mg via ORAL
  Filled 2018-03-11: qty 1

## 2018-03-11 MED ORDER — HEPARIN (PORCINE) IN NACL 100-0.45 UNIT/ML-% IJ SOLN
1400.0000 [IU]/h | INTRAMUSCULAR | Status: DC
  Filled 2018-03-11: qty 250

## 2018-03-11 NOTE — Progress Notes (Signed)
3 Days Post-Op   Subjective/Chief Complaint: Comfortable this morning  Tolerating po well   Objective: Vital signs in last 24 hours: Temp:  [97.8 F (36.6 C)-98.2 F (36.8 C)] 98.2 F (36.8 C) (11/02 0523) Pulse Rate:  [64-90] 64 (11/02 0523) Resp:  [15-18] 15 (11/02 0523) BP: (111-134)/(72-86) 112/72 (11/02 0523) SpO2:  [95 %-98 %] 96 % (11/02 0523) Last BM Date: 03/10/18  Intake/Output from previous day: 11/01 0701 - 11/02 0700 In: 1277.7 [P.O.:600; I.V.:677.7] Out: 675 [Urine:675] Intake/Output this shift: No intake/output data recorded.  Exam: Looks well Awake and alert Abdomen soft, incisions stable  Lab Results:  Recent Labs    03/10/18 0457 03/11/18 0305  WBC 7.3 6.4  HGB 11.8* 11.2*  HCT 36.4* 34.8*  PLT 147* 140*   BMET Recent Labs    03/10/18 0457 03/11/18 0305  NA 135 138  K 4.3 3.9  CL 104 107  CO2 24 23  GLUCOSE 150* 163*  BUN 19 17  CREATININE 1.52* 1.40*  CALCIUM 8.3* 8.3*   PT/INR No results for input(s): LABPROT, INR in the last 72 hours. ABG No results for input(s): PHART, HCO3 in the last 72 hours.  Invalid input(s): PCO2, PO2  Studies/Results: No results found.  Anti-infectives: Anti-infectives (From admission, onward)   Start     Dose/Rate Route Frequency Ordered Stop   03/08/18 0600  cefoTEtan (CEFOTAN) 2 g in sodium chloride 0.9 % 100 mL IVPB     2 g 200 mL/hr over 30 Minutes Intravenous On call to O.R. 03/08/18 0539 03/08/18 0745      Assessment/Plan: s/p Procedure(s): LAPAROSCOPIC HARTMANNS REVERSAL (N/A) PARASTOMAL HERNIA REPAIR (N/A) FLEXIBLE SIGMOIDOSCOPY (N/A) CYSTOSCOPY WITH RETROGRADE AND BILATERAL URETERAL STENT PLACEMENT (Bilateral)  hgb stable. Transition to po anticoags Hopefully home tomorrow  LOS: 3 days    Seriah Brotzman A 03/11/2018

## 2018-03-11 NOTE — Discharge Instructions (Signed)
POST OP INSTRUCTIONS AFTER COLON SURGERY  1. DIET: Be sure to include lots of fluids daily to stay hydrated - 64oz of water per day (8, 8 oz glasses).  Avoid fast food or heavy meals for the first couple of weeks as your are more likely to get nauseated. Avoid raw/uncooked fruits or vegetables for the first 4 weeks (its ok to have these if they are blended into smoothie form). If you have fruits/vegetables, make sure they are cooked until soft enough to mash on the roof of your mouth and chew your food well. Otherwise, diet as tolerated.  2. Take your usually prescribed home medications unless otherwise directed.  3. PAIN CONTROL: a. Pain is best controlled by a usual combination of three different methods TOGETHER: i. Ice/Heat ii. Over the counter pain medication iii. Prescription pain medication b. Most patients will experience some swelling and bruising around the surgical site.  Ice packs or heating pads (30-60 minutes up to 6 times a day) will help. Some people prefer to use ice alone, heat alone, alternating between ice & heat.  Experiment to what works for you.  Swelling and bruising can take several weeks to resolve.   c. It is helpful to take an over-the-counter pain medication regularly for the first few weeks: i. Acetaminophen (Tylenol) - you may take 650mg  every 6 hours as needed. You can take this with motrin as they act differently on the body. If you are taking a narcotic pain medication that has acetaminophen in it, do not take over the counter tylenol at the same time. ii. Given issues with your kidney function at baseline, avoid NSAIDs (ibuprofen/advil/alleve/motrin) d. A  prescription for pain medication should be given to you upon discharge.  Take your pain medication as prescribed if your pain is not adequatly controlled with the over-the-counter pain reliefs mentioned above.  4. Avoid getting constipated.  Between the surgery and the pain medications, it is common to experience  some constipation.  Increasing fluid intake and taking a fiber supplement (such as Metamucil, Citrucel, FiberCon, MiraLax, etc) 1-2 times a day regularly will usually help prevent this problem from occurring.  A mild laxative (prune juice, Milk of Magnesia, MiraLax, etc) should be taken according to package directions if there are no bowel movements after 48 hours.    5. Dressing: Your incisions are covered in Dermabond which is like sterile superglue for the skin. This will come off on it's own in a couple weeks. It is waterproof and you may bathe normally starting the day after your surgery in a shower. Avoid baths/pools/lakes/oceans until your wounds have fully healed. 6. Colostomy site - the colostomy closure site can be open and washed during your shower - soap/water over your wound. You may then "wick" the wound with a moist piece of gauze. Cover with dry gauze on top. It is normal for a small amount of drainage at this location while it heals. If bleeding occurs, you may pack a piece of gauze in the site and hold pressure for 30 minutes - with anticoagulation, some bleeding will likely occur but should be able to be controlled with 30 minutes of pressure.  7. ACTIVITIES as tolerated:   a. Avoid heavy lifting (>10lbs or 1 gallon of milk) for the next 6 weeks. b. You may resume regular daily activities as tolerated--such as daily self-care, walking, climbing stairs--gradually increasing activities as tolerated.  If you can walk 30 minutes without difficulty, it is safe to try more intense  activity such as jogging, treadmill, bicycling, low-impact aerobics.  c. DO NOT PUSH THROUGH PAIN.  Let pain be your guide: If it hurts to do something, don't do it. d. Dennis Bast may drive when you are no longer taking prescription pain medication, you can comfortably wear a seatbelt, and you can safely maneuver your car and apply brakes.  8. FOLLOW UP in our office a. Please call [facility] at [phone] to set up an  appointment to see your surgeon in the office for a follow-up appointment approximately 2 weeks after your surgery. b. Make sure that you call for this appointment the day you arrive home to insure a convenient appointment time.  9. If you have disability or family leave forms that need to be completed, you may have them completed by your primary care physician's office; for return to work instructions, please ask our office staff and they will be happy to assist you in obtaining this documentation   When to call us [phone]: 1. Poor pain control 2. Reactions / problems with new medications (rash/itching, etc)  3. Fever over 101.5 F (38.5 C) 4. Inability to urinate 5. Nausea/vomiting 6. Worsening swelling or bruising 7. Continued bleeding from incision. 8. Increased pain, redness, or drainage from the incision  The clinic staff is available to answer your questions during regular business hours (8:30am-5pm).  Please dont hesitate to call and ask to speak to one of our nurses for clinical concerns.   A surgeon from [facility] is always on call at the hospitals   If you have a medical emergency, go to the nearest emergency room or call 911.  [facility], [place] [address], [place], [place], [place]  [address] MAIN: [phone] FAX: [phone] [email or web address]

## 2018-03-11 NOTE — Plan of Care (Signed)
Patient up in chair in room; no c/o pain at this time. Has been up to bathroom w/ minimal assist. Will continue to monitor.

## 2018-03-11 NOTE — Progress Notes (Signed)
ANTICOAGULATION CONSULT NOTE -  Consult  Pharmacy Consult for Heparin Indication: atrial fibrillation  No Known Allergies  Patient Measurements: Height: 6\' 2"  (188 cm) Weight: 229 lb 0.9 oz (103.9 kg) IBW/kg (Calculated) : 82.2 HEPARIN DW (KG): 103.6   Vital Signs: Temp: 97.8 F (36.6 C) (11/01 2149) Temp Source: Oral (11/01 2149) BP: 134/78 (11/01 2149) Pulse Rate: 81 (11/01 2149)  Labs: Recent Labs    03/09/18 0449 03/10/18 0457 03/10/18 1328 03/10/18 2241  HGB 11.2* 11.8*  --   --   HCT 34.9* 36.4*  --   --   PLT 147* 147*  --   --   APTT  --   --  32  --   HEPARINUNFRC  --   --  <0.10* 0.25*  CREATININE 1.59* 1.52*  --   --     Estimated Creatinine Clearance: 56.5 mL/min (A) (by C-G formula based on SCr of 1.52 mg/dL (H)).   Medical History: Past Medical History:  Diagnosis Date  . AAA (abdominal aortic aneurysm) ([place])   . Allergy   . Atrial fibrillation [facility]) October 2013   Xarelto started; duration unknown  . Cataract   . Chronic kidney disease    Stage III   . Coronary artery disease    post bare-metal stent to LAD in 2001  . Diabetes mellitus    type 2   . Hypertension   . Myocardial infarct ([place]) 2001  . Obesity     Medications:  Xarelto 20mg  PTA- LD 10/26  Assessment: 72 yo M on Xarelto for hx Afib.  This was held for planned procedure on 10/26.  He is s/p reversal of colostomy and placement of bilateral ureteral catheter on 10/30.  Hg is stable post-op on SQ heparin (LD 0537 11/1).  Pharmacy consulted to start IV heparin infusion today.  11/1  Baseline heparin level: <0.1  CBC- Hg low at 11.8 & pltc 147, but stable x24h  Some blood-tinged stool this morning   2254 HL = 0.25 below goal, no infusion issues and recent BM with no blood per RN Goal of Therapy:  Heparin level 0.3-0.7 units/ml aPTT 66-102 seconds Monitor platelets by anticoagulation protocol: Yes   Plan:  Increase heparin infusion to 1400 units/hr.  NO BOLUS due to  recent surgery.   Check heparin level in 8h Daily Heparin level & CBC while on heparin F/U plans to resume Xarelto  Lawana Pai R 03/11/2018,12:05 AM

## 2018-03-12 LAB — CBC
HCT: 35.4 % — ABNORMAL LOW (ref 39.0–52.0)
Hemoglobin: 11.3 g/dL — ABNORMAL LOW (ref 13.0–17.0)
MCH: 32.2 pg (ref 26.0–34.0)
MCHC: 31.9 g/dL (ref 30.0–36.0)
MCV: 100.9 fL — ABNORMAL HIGH (ref 80.0–100.0)
NRBC: 0 % (ref 0.0–0.2)
PLATELETS: 151 10*3/uL (ref 150–400)
RBC: 3.51 MIL/uL — AB (ref 4.22–5.81)
RDW: 13.6 % (ref 11.5–15.5)
WBC: 7.1 10*3/uL (ref 4.0–10.5)

## 2018-03-12 LAB — BASIC METABOLIC PANEL
Anion gap: 7 (ref 5–15)
BUN: 19 mg/dL (ref 8–23)
CALCIUM: 8.3 mg/dL — AB (ref 8.9–10.3)
CO2: 24 mmol/L (ref 22–32)
Chloride: 106 mmol/L (ref 98–111)
Creatinine, Ser: 1.38 mg/dL — ABNORMAL HIGH (ref 0.61–1.24)
GFR calc Af Amer: 57 mL/min — ABNORMAL LOW (ref >60)
GFR, EST NON AFRICAN AMERICAN: 50 mL/min — AB (ref >60)
Glucose, Bld: 174 mg/dL — ABNORMAL HIGH (ref 70–99)
POTASSIUM: 3.9 mmol/L (ref 3.5–5.1)
SODIUM: 137 mmol/L (ref 135–145)

## 2018-03-12 LAB — GLUCOSE, CAPILLARY
GLUCOSE-CAPILLARY: 176 mg/dL — AB (ref 70–99)
GLUCOSE-CAPILLARY: 177 mg/dL — AB (ref 70–99)

## 2018-03-12 NOTE — Discharge Summary (Signed)
Physician Discharge Summary  Patient ID: BASEM YANNUZZI MRN: [id number] DOB/AGE: [birth year] 72 y.o.  Admit date: 03/08/2018 Discharge date: 03/12/2018  Admission Diagnoses:  Discharge Diagnoses:  Active Problems:   S/P colostomy takedown   Discharged Condition: good  Hospital Course: uneventful post op recovery s/p Hartman's reversal  Consults: None  Significant Diagnostic Studies:   Treatments: surgery: see Op note in epic  Discharge Exam: Blood pressure (!) 141/94, pulse 93, temperature (!) 97.5 F (36.4 C), temperature source Oral, resp. rate 18, height 6\' 2"  (1.88 m), weight 103.9 kg, SpO2 97 %. General appearance: alert, cooperative and no distress Resp: clear to auscultation bilaterally Incision/Wound: incisions and wounds are clean  Disposition: Discharge disposition: 01-Home or Self Care        Allergies as of 03/12/2018   No Known Allergies     Medication List    TAKE these medications   atorvastatin 20 MG tablet Commonly known as:  LIPITOR Take 20 mg by mouth at bedtime.   ferrous sulfate 325 (65 FE) MG tablet Take 325 mg by mouth 3 (three) times a week.   Insulin Isophane & Regular Human (70-30) 100 UNIT/ML PEN Commonly known as:  HUMULIN 70/30 MIX INJECT SUBCUTANEOUSLY 25  UNITS BEFORE BREAKFAST AND  20 UNITS BEFORE DINNER What changed:    how much to take  how to take this  when to take this   metFORMIN 500 MG tablet Commonly known as:  GLUCOPHAGE Take 1 tablet (500 mg total) by mouth daily with breakfast.   metoprolol tartrate 25 MG tablet Commonly known as:  LOPRESSOR Take 0.5 tablets (12.5 mg total) by mouth 2 (two) times daily.   nitroGLYCERIN 0.4 MG SL tablet Commonly known as:  NITROSTAT Place 0.4 mg under the tongue every 5 (five) minutes as needed for chest pain.   omeprazole 20 MG capsule Commonly known as:  PRILOSEC Take 20 mg by mouth daily as needed (for acid reflux/indigestion).   PROBIOTIC-10 PO Take 1 capsule  by mouth 3 (three) times a week.   ramipril 10 MG capsule Commonly known as:  ALTACE Take 10 mg by mouth daily.   RELION PEN NEEDLES 32G X 4 MM Misc Generic drug:  Insulin Pen Needle USE TWO TIMES A DAY AS ADVISED   rivaroxaban 20 MG Tabs tablet Commonly known as:  XARELTO Take 1 tablet (20 mg total) by mouth daily.   traMADol 50 MG tablet Commonly known as:  ULTRAM Take 1 tablet (50 mg total) by mouth every 6 (six) hours as needed for up to 7 days (postop pain not controlled with tylenol).   TRULICITY 1.5 OF/1.2RF Sopn Generic drug:  Dulaglutide INJECT SUBCUTANEOUSLY 1.5  MG WEEKLY What changed:  See the new instructions.      Follow-up Information    Ileana Roup, MD Follow up.   Specialty:  General Surgery Why:  appointment already made Contact information: [place] [address] [phone]           Signed: Harl Bowie 03/12/2018, 9:39 AM

## 2018-03-12 NOTE — Progress Notes (Signed)
Patient ID: [name], male   DOB: 1945-10-23, 72 y.o.   MRN: [id number]  Doing well Wants to go home Tolerating po and having BM's Abdomen soft  Plan: D/c home

## 2018-03-12 NOTE — Progress Notes (Signed)
Discharge instructions reviewed with patient. All questions answered. Wound care teaching provided to wife, along with supplies, and all questions were answered. Patient wheeled out to vehicle with belongings by nurse tech

## 2018-03-20 ENCOUNTER — Encounter: Payer: Self-pay | Admitting: *Deleted

## 2018-03-20 ENCOUNTER — Other Ambulatory Visit: Payer: Self-pay | Admitting: *Deleted

## 2018-03-20 NOTE — Patient Outreach (Signed)
[facility]) Care Management  03/20/2018  [name] 1946/03/05 [id number]   [place] [facility]  Referral Date: 09/29/16 Referral Source: self referral Reason for Referral: Not able to afford prescriptions due to cost Insurance: [employer]   Outreach Attempt:  Successful telephone outreach to patient's wife for post surgical follow up.  HIPAA verified with patient's wife (per patient's request).  Wife reporting patient is doing well.  Pain is controlled.  Tolerating solid foods without constipation, states bowels are moving normally.  Wife reports wet to dry incision dressing changes and the packing of the incision is getting smaller.  States wound looks good, denies any signs or symptoms of infection or abnormal bleeding.  Denies any fever.  Reports blood sugars have been within range, unless patient eats something to elevate it.  Appointment:  Post surgical appointment with Dr. Dema Severin is this Wednesday, November 13.  Encouraged to keep and attend appointment.  Plan:  RN Health Coach will make next telephone outreach to patient in the month of January.  [place] [phone] [name]

## 2018-03-29 ENCOUNTER — Ambulatory Visit: Payer: Medicare Other | Admitting: Internal Medicine

## 2018-03-29 ENCOUNTER — Encounter: Payer: Self-pay | Admitting: Internal Medicine

## 2018-03-29 ENCOUNTER — Telehealth: Payer: Self-pay | Admitting: Internal Medicine

## 2018-03-29 VITALS — BP 110/70 | HR 87 | Ht 75.0 in | Wt 228.0 lb

## 2018-03-29 DIAGNOSIS — E1165 Type 2 diabetes mellitus with hyperglycemia: Secondary | ICD-10-CM

## 2018-03-29 DIAGNOSIS — E1151 Type 2 diabetes mellitus with diabetic peripheral angiopathy without gangrene: Secondary | ICD-10-CM | POA: Diagnosis not present

## 2018-03-29 DIAGNOSIS — E785 Hyperlipidemia, unspecified: Secondary | ICD-10-CM | POA: Diagnosis not present

## 2018-03-29 DIAGNOSIS — IMO0002 Reserved for concepts with insufficient information to code with codable children: Secondary | ICD-10-CM

## 2018-03-29 MED ORDER — DULAGLUTIDE 1.5 MG/0.5ML ~~LOC~~ SOAJ: SUBCUTANEOUS | 3 refills | Status: DC

## 2018-03-29 MED ORDER — INSULIN ISOPHANE & REGULAR (HUMAN 70-30)100 UNIT/ML KWIKPEN: PEN_INJECTOR | SUBCUTANEOUS | 3 refills | Status: DC

## 2018-03-29 NOTE — Telephone Encounter (Signed)
Patient that Dr Gherghe tried to order the patient Trulicity and tried to order a 180 day supply (6 boxes). insurance denied filling this °She stated that a 90 day supply needed to be sent in (3 boxes) for insurance to appove ° ° ° ° °[facility] - [place], [place] - [address] ° °Dulaglutide (TRULICITY) 1.5 MG/0.5ML SOPN °

## 2018-03-29 NOTE — Patient Instructions (Addendum)
Please continue: - Trulicity 1.5 mg weekly  You can try to stop Metformin but restart it if sugars are higher in am.  Please increase: - Insulin 70/30 25 units before b'fast and dinner  Please return in 4 months with your sugar log.

## 2018-03-29 NOTE — Progress Notes (Signed)
Patient ID: MALVIN MORRISH, male   DOB: Aug 01, 1945, 72 y.o.   MRN: [id number]  HPI: MIKOLAJ WOOLSTENHULME is a 72 y.o.-year-old male, returning for f/u for DM2, dx 1995, insulin-dependent, uncontrolled, with complications (CKD stage 3, CAD - AMI, s/p stent LAD 2001). Last visit 4 months ago.  He is here with his wife who offers part of the history, especially related to his blood sugars, insulin doses, and diet.  He has a history of diverticulitis (sees Dr. Revan Gendron Gong).  He had a colectomy in 05/2017 and had to have a colostomy bag.  He was feeling better after his colectomy.  He had hernia repair surgery and reversal of his ostomy 03/08/2018.  He started to exercise at the gym - started 2 weeks ago: walks on the treadmill.  Last hemoglobin A1c was: Lab Results  Component Value Date   HGBA1C 7.4 (H) 03/02/2018   HGBA1C 7.4 (A) 11/30/2017   HGBA1C 5.5 05/31/2017   He is on on: - Metformin 500 mg with dinner - re-added 27/0623 - Trulicity 1.5 mg weekly - Insulin 70/30:  20 >> 25 units before breakfast  20 units before dinner He was on Tanzeum 50 mg weekly (started 09/2015) - >200$ per month! >> Trulicity 1.5 mg weekly He was previously on glipizide-metformin but could not tolerate it due to diarrhea -stopped 08/2017  Pt checks his sugars - 3x a day: - am:116-165 >> 92, 115-187 >> 121, 145-177, 186, 190 (buttemilk + banana at night - for diarrhea) - 2h after b'fast: 131 >> n/c >> 223 >> n/c  - before lunch: 198, 215 >> n/c - 2h after lunch: 184, 231 >> 179-228 >> n/c - before dinner: 108-150, 161 >> 152-244 >> 90, 130-209, 222 - 2h after dinner:  n/c >> 176 >> 196 >> n/c - bedtime:129, 162 >> n/c >> 76, 181 >> 212 >> 142-203 - nighttime:43-67, 156 >> 74-109 >> 90, 120 >> n/c Lowest sugar was 59 at bedtime >> 90; he does not have hypoglycemia awareness! Highest sugar was 249 >> 222.  Pt's meals are: - Breakfast: eggs, toast, ham/sausage or cereals + milk - Lunch: sandwich, vegetables,  meat - Dinner: seafood, meat, vegetables - Snacks: nabs  Reviewed labs from PCP:  04/13/2017: - Lipids: 124/65/39/72 - CMP normal, glucose 98, BUN/creatinine 10/1.3, GFR 55, potassium 3.4 (3.5-5.2), Ca 8.1 (8.6-10.2) - CBC with differential: low Hb 11.3 (13-17.7), low RBC 3.57 (4.14-5.8) - ferritin 167 - PSA 0.5  09/30/2015: - Hep C virus antibody <0.1 - Lipids: 126/68/44/68 - CMP normal, except glucose 209, BUN/creatinine 22/1.39, GFR 51, potassium 5.4 (3.5-5.2), CO2 17 (18-29) - CBC with differential normal - B12 vitamin 305 (76-283)  04/01/2015: - ACR 56.3 - CBC with differential normal - Lipids: 116/56/42/63 - CMP: Glucose 193, BUN/creatinine 21/1.23, GFR 60, LFTs normal - PSA 0.5    -+ CKD stage 3: Lab Results  Component Value Date   BUN 19 03/12/2018   CREATININE 1.38 (H) 03/12/2018  On ramipril. -+ HL. On Lipitor.  - last eye exam was in 05/2017: No DR - no numbness and tingling in his feet.   ROS: Constitutional: no weight gain/no weight loss, no fatigue, no subjective hyperthermia, no subjective hypothermia Eyes: no blurry vision, no xerophthalmia ENT: no sore throat, no nodules palpated in neck, no dysphagia, no odynophagia, no hoarseness Cardiovascular: no CP/no SOB/no palpitations/no leg swelling Respiratory: no cough/no SOB/no wheezing Gastrointestinal: no N/no V/no D/no C/no acid reflux Musculoskeletal: no muscle aches/no joint aches Skin:  no rashes, no hair loss Neurological: no tremors/no numbness/no tingling/no dizziness  I reviewed pt's medications, allergies, PMH, social hx, family hx, and changes were documented in the history of present illness. Otherwise, unchanged from my initial visit note.  Past Medical History:  Diagnosis Date  . AAA (abdominal aortic aneurysm) ([place])   . Allergy   . Atrial fibrillation [facility]) October 2013   Xarelto started; duration unknown  . Cataract   . Chronic kidney disease    Stage III   . Coronary artery  disease    post bare-metal stent to LAD in 2001  . Diabetes mellitus    type 2   . Hypertension   . Myocardial infarct ([place]) 2001  . Obesity    Past Surgical History:  Procedure Laterality Date  . CARDIAC CATHETERIZATION  2006   stable with primarily nonobstructive disease  . CARDIOVERSION  03/27/2012   Procedure: CARDIOVERSION;  Surgeon: Carlena Bjornstad, MD;  Location: [facility] ENDOSCOPY;  Service: Cardiovascular;  Laterality: N/A;  . COLOSTOMY TAKEDOWN N/A 03/08/2018   Procedure: LAPAROSCOPIC HARTMANNS REVERSAL;  Surgeon: Ileana Roup, MD;  Location: [facility] ORS;  Service: General;  Laterality: N/A;  . CORONARY ANGIOPLASTY WITH STENT PLACEMENT  2001   bare-metal stent to the LAD  . CYSTOSCOPY W/ URETERAL STENT PLACEMENT Bilateral 03/08/2018   Procedure: CYSTOSCOPY WITH RETROGRADE AND BILATERAL URETERAL STENT PLACEMENT;  Surgeon: Lucas Mallow, MD;  Location: [facility] ORS;  Service: Urology;  Laterality: Bilateral;  . CYSTOSCOPY WITH STENT PLACEMENT Bilateral 06/03/2017   Procedure: CYSTOSCOPY WITH STENT PLACEMENT;  Surgeon: Festus Aloe, MD;  Location: [facility] ORS;  Service: Urology;  Laterality: Bilateral;  . FLEXIBLE SIGMOIDOSCOPY N/A 03/08/2018   Procedure: FLEXIBLE SIGMOIDOSCOPY;  Surgeon: Ileana Roup, MD;  Location: [facility] ORS;  Service: General;  Laterality: N/A;  . HERNIA REPAIR  2006  . LAPAROSCOPIC PARASTOMAL HERNIA N/A 03/08/2018   Procedure: PARASTOMAL HERNIA REPAIR;  Surgeon: Ileana Roup, MD;  Location: [facility] ORS;  Service: General;  Laterality: N/A;  . LAPAROSCOPIC SIGMOID COLECTOMY N/A 06/03/2017   Procedure: LAPAROSCOPIC  SIGMOID COLECTOMY, COLOSTOMY;  Surgeon: Ileana Roup, MD;  Location: [facility] ORS;  Service: General;  Laterality: N/A;  . SKIN GRAFT  1981   Social History Main Topics  . Smoking status: Former Smoker -- 0.50 packs/day for 40 years    Types: Cigarettes, Pipe, Cigars    Quit date: 05/11/1999  . Smokeless tobacco: Never Used  . Alcohol Use:  No  . Drug Use: No   Social History Narrative   Married    Retired: [employer]   3 grown children   Current Outpatient Medications on File Prior to Visit  Medication Sig Dispense Refill  . atorvastatin (LIPITOR) 20 MG tablet Take 20 mg by mouth at bedtime.     . ferrous sulfate 325 (65 FE) MG tablet Take 325 mg by mouth 3 (three) times a week.    . Insulin Isophane & Regular Human (HUMULIN 70/30 KWIKPEN) (70-30) 100 UNIT/ML PEN INJECT SUBCUTANEOUSLY 25  UNITS BEFORE BREAKFAST AND  20 UNITS BEFORE DINNER (Patient taking differently: Inject 20-25 Units into the skin See admin instructions. INJECT SUBCUTANEOUSLY 25  UNITS BEFORE BREAKFAST AND  20 UNITS BEFORE DINNER) 45 mL 3  . metFORMIN (GLUCOPHAGE) 500 MG tablet Take 1 tablet (500 mg total) by mouth daily with breakfast. 90 tablet 3  . metoprolol tartrate (LOPRESSOR) 25 MG tablet Take 0.5 tablets (12.5 mg total) by mouth 2 (two) times daily. 90 tablet  2  . nitroGLYCERIN (NITROSTAT) 0.4 MG SL tablet Place 0.4 mg under the tongue every 5 (five) minutes as needed for chest pain.     Marland Kitchen omeprazole (PRILOSEC) 20 MG capsule Take 20 mg by mouth daily as needed (for acid reflux/indigestion).     . Probiotic Product (PROBIOTIC-10 PO) Take 1 capsule by mouth 3 (three) times a week.     . ramipril (ALTACE) 10 MG capsule Take 10 mg by mouth daily.    Marland Kitchen RELION PEN NEEDLES 32G X 4 MM MISC USE TWO TIMES A DAY AS ADVISED 100 each 7  . rivaroxaban (XARELTO) 20 MG TABS tablet Take 1 tablet (20 mg total) by mouth daily. 90 tablet 1  . TRULICITY 1.5 WU/1.3KG SOPN INJECT SUBCUTANEOUSLY 1.5  MG WEEKLY (Patient taking differently: Inject 1.5 mg into the skin every Sunday. ) 6 mL 1   No current facility-administered medications on file prior to visit.    No Known Allergies Family History  Problem Relation Age of Onset  . Heart disease Father   . Heart attack Father   . ALS Mother   . Diabetes Sister   . Hypertension Sister   . Heart failure Brother   . Stroke Neg  Hx    PE: BP 110/70   Pulse 87   Ht 6\' 3"  (1.905 m) Comment: measured  Wt 228 lb (103.4 kg)   SpO2 97%   BMI 28.50 kg/m  Body mass index is 28.5 kg/m. Wt Readings from Last 3 Encounters:  03/29/18 228 lb (103.4 kg)  03/10/18 229 lb 0.9 oz (103.9 kg)  03/02/18 233 lb (105.7 kg)   Constitutional: overweight, in NAD Eyes: PERRLA, EOMI, no exophthalmos ENT: moist mucous membranes, no thyromegaly, no cervical lymphadenopathy Cardiovascular: RRR, No MRG Respiratory: CTA B Gastrointestinal: abdomen soft, NT, ND, BS+ Musculoskeletal: no deformities, strength intact in all 4 Skin: moist, warm, no rashes Neurological: no tremor with outstretched hands, DTR normal in all 4  ASSESSMENT: 1. DM2, insulin-dependent, uncontrolled, with complications - CAD - CKD stage 3  2. HL  3.  Overweight  PLAN:  1. Patient with longstanding, previously uncontrolled diabetes, now with improved control on a GLP-1 receptor agonist and premixed insulin regimen.  At last visit, we also added a small dose of metformin with dinner.  He was getting Trulicity through the patient assistance program but he had to pay full price for it at last visit.  He decided to continue to get this and sugars were much better on the medication.  As he was having increased output through his colectomy stoma, we stopped metformin and glipizide combination at the beginning of the year.  At that time, he was not eating very well due to his previous diverticulitis episodes.  Since last visit he had surgery for reversal of his ostomy. Latest HbA1c was stable, at 7.4% less than 1 month ago. - At this visit, sugars are still above goal in the morning.  Since he complains of occasional diarrhea with metformin (although this was mostly before the surgery), will stop metformin for now and I advised him to only started if this is that the sugars increase - I also advised him to increase the premixed insulin dose before dinner to improve the  sugars in the morning - Sugars are higher than goal before dinner but this may be because he had a snack in the afternoon.  Discussed about cutting this down.  He also has to be careful not to gain weight  after his hernia surgery. - I suggested to:  Patient Instructions  Please continue: - Trulicity 1.5 mg weekly  You can try to stop Metformin but restart it if sugars are higher in am.  Please increase: - Insulin 70/30 25 units before b'fast and dinner  Please return in 4 months with your sugar log.   - continue checking sugars at different times of the day - check 2-3x a day, rotating checks - advised for yearly eye exams >> he is UTD - Return to clinic in 4 mo with sugar log       2. HL - Reviewed latest lipid panel from 2018: LDL at goal - Continues Lipitor without side effects. -He has another lipid panel coming up at his next visit with PCP in December  3.  Overweight -He lost 5 pounds in the last month. -Continue Trulicity which should also help with weight loss.  Philemon Kingdom, MD PhD [facility]

## 2018-03-30 ENCOUNTER — Other Ambulatory Visit: Payer: Self-pay

## 2018-03-30 ENCOUNTER — Telehealth: Payer: Self-pay | Admitting: Internal Medicine

## 2018-03-30 MED ORDER — DULAGLUTIDE 1.5 MG/0.5ML ~~LOC~~ SOAJ: SUBCUTANEOUS | 3 refills | Status: DC

## 2018-03-30 NOTE — Telephone Encounter (Signed)
[name] Mora Appl for Trulicity needs to be 3 boxes for [employer] sent to [facility] previously sent was for 6 boxes for patient which was denied by insurance)

## 2018-03-30 NOTE — Telephone Encounter (Signed)
This has been resolved

## 2018-03-30 NOTE — Telephone Encounter (Signed)
LVM explaining note below

## 2018-03-30 NOTE — Telephone Encounter (Signed)
Sent in new prescription for 12 pens to [facility] which is a 90 day supply for this patient

## 2018-04-26 DIAGNOSIS — I2510 Atherosclerotic heart disease of native coronary artery without angina pectoris: Secondary | ICD-10-CM | POA: Diagnosis not present

## 2018-04-26 DIAGNOSIS — E119 Type 2 diabetes mellitus without complications: Secondary | ICD-10-CM | POA: Diagnosis not present

## 2018-04-26 DIAGNOSIS — E782 Mixed hyperlipidemia: Secondary | ICD-10-CM | POA: Diagnosis not present

## 2018-04-26 DIAGNOSIS — I10 Essential (primary) hypertension: Secondary | ICD-10-CM | POA: Diagnosis not present

## 2018-04-26 DIAGNOSIS — N183 Chronic kidney disease, stage 3 (moderate): Secondary | ICD-10-CM | POA: Diagnosis not present

## 2018-04-28 ENCOUNTER — Encounter: Payer: Self-pay | Admitting: Internal Medicine

## 2018-04-28 DIAGNOSIS — E119 Type 2 diabetes mellitus without complications: Secondary | ICD-10-CM | POA: Diagnosis not present

## 2018-04-28 DIAGNOSIS — H5203 Hypermetropia, bilateral: Secondary | ICD-10-CM | POA: Diagnosis not present

## 2018-04-28 LAB — HM DIABETES EYE EXAM

## 2018-04-28 NOTE — Progress Notes (Signed)
Received labs from 04/26/2018:  CMP normal with the exception of a slightly high glucose of 124, BUN/creatinine 19/1.5, GFR 46 and slightly higher chloride, of 107.  Lipids: 147/58/47/88  CBC with slightly low red blood cells of 4.08 (4.14-5.8), normal hemoglobin of 13.3 and slightly high MCV of 99 (79-97)  PSA 0.8

## 2018-05-08 ENCOUNTER — Other Ambulatory Visit: Payer: Self-pay | Admitting: Cardiovascular Disease

## 2018-05-09 NOTE — Telephone Encounter (Signed)
Pt is a 72 yr old male who saw Dr. Angelena Form on 12/09/17. Weight on 03/29/18 was 103.4Kg. SCr on 04/26/18 was 1.50, CrCl is 71mL/min. Will refill Xarelto 20mg  QD.

## 2018-05-23 ENCOUNTER — Encounter: Payer: Self-pay | Admitting: *Deleted

## 2018-05-23 ENCOUNTER — Other Ambulatory Visit: Payer: Self-pay | Admitting: *Deleted

## 2018-05-23 NOTE — Patient Outreach (Signed)
[facility]) Care Management  [facility] Care Manager  05/23/2018   [name] [birth year] [id number]   [place] Quarterly Outreach   Referral Date: 09/29/16 Referral Source: self referral Reason for Referral: Not able to afford prescriptions due to cost Insurance: [employer]   Outreach Attempt:  Successful telephone outreach to patient and wife for quarterly follow up.  HIPAA verified with patient's wife per patient's request.  Reports patient is healing well post surgery.  Denies any signs and symptoms of infection, trouble eating, or trouble with bowel movements.  Reports episodes of hypo and hyperglycemia.  Last Hgb A1C was 7.4 on 03/02/2018.  Fasting blood sugar this morning was 145.  Insulin dose was increased at last Endocrinologist appointment.  Patient and wife electing to discontinue with [facility] services and Disease Management outreaches.  Encouraged patient and wife to contact [facility] in the future if needs arise.  Reiterated [place] available for future needs.  Encounter Medications:  Outpatient Encounter Medications as of 05/23/2018  Medication Sig Note  . atorvastatin (LIPITOR) 20 MG tablet Take 20 mg by mouth at bedtime.    . Dulaglutide (TRULICITY) 1.5 MM/7.6KG SOPN INJECT SUBCUTANEOUSLY 1.5  MG WEEKLY   . ferrous sulfate 325 (65 FE) MG tablet Take 325 mg by mouth 3 (three) times a week.   . Insulin Isophane & Regular Human (HUMULIN 70/30 KWIKPEN) (70-30) 100 UNIT/ML PEN INJECT SUBCUTANEOUSLY 25  UNITS BEFORE BREAKFAST AND  25 UNITS BEFORE DINNER   . metoprolol tartrate (LOPRESSOR) 25 MG tablet Take 0.5 tablets (12.5 mg total) by mouth 2 (two) times daily.   . nitroGLYCERIN (NITROSTAT) 0.4 MG SL tablet Place 0.4 mg under the tongue every 5 (five) minutes as needed for chest pain.    Marland Kitchen omeprazole (PRILOSEC) 20 MG capsule Take 20 mg by mouth daily as needed (for acid reflux/indigestion).    . Probiotic Product (PROBIOTIC-10 PO) Take 1  capsule by mouth 3 (three) times a week.    . ramipril (ALTACE) 10 MG capsule Take 10 mg by mouth daily.   Marland Kitchen RELION PEN NEEDLES 32G X 4 MM MISC USE TWO TIMES A DAY AS ADVISED   . XARELTO 20 MG TABS tablet TAKE 1 TABLET BY MOUTH  DAILY   . metFORMIN (GLUCOPHAGE) 500 MG tablet Take 1 tablet (500 mg total) by mouth daily with breakfast. (Patient not taking: Reported on 05/23/2018) 05/23/2018: Not taking currently, is to resume with sustained hyperglycemia   No facility-administered encounter medications on file as of 05/23/2018.     Functional Status:  In your present state of health, do you have any difficulty performing the following activities: 03/10/2018 03/07/2018  Hearing? Y Y  Comment - bilateral hearing aides  Vision? N N  Difficulty concentrating or making decisions? N N  Walking or climbing stairs? N N  Dressing or bathing? N N  Doing errands, shopping? N N  Preparing Food and eating ? - N  Using the Toilet? - N  In the past six months, have you accidently leaked urine? - N  Do you have problems with loss of bowel control? - N  Managing your Medications? - N  Managing your Finances? - N  Housekeeping or managing your Housekeeping? - N  Some recent data might be hidden    Fall/Depression Screening: Fall Risk  05/23/2018 03/07/2018 12/05/2017  Falls in the past year? 0 No No  Follow up Falls evaluation completed;Falls prevention discussed;Education provided - -  PHQ 2/9 Scores 03/07/2018 03/02/2017 10/06/2016 10/01/2016 10/03/2014  PHQ - 2 Score 0 0 0 0 0   [facility] CM Care Plan Problem One     Most Recent Value  Care Plan Problem One  knowledge deficiet related to post operative care  Role Documenting the Problem One  [place] for Problem One  Active  [facility] Long Term Goal   Patient will decrease his Hgb A1C by 0.5 points in the next 60 days  [facility] Long Term Goal Start Date  03/07/18  [facility] CM Short Term Goal #1   Patient will verbalize no signs and symptoms of surgical  infection after colostomy reversal in the next 30 days  [facility] CM Short Term Goal #1 Start Date  03/07/18  [facility] CM Short Term Goal #1 Met Date  05/23/18     Appointments:  Last attended appointment with primary care provider on 04/26/2018 and has follow up appointment in May 2020.  Saw Dr. Cruzita Lederer, Endocrinologist on 03/29/2018 and has scheduled follow up on 08/01/2018.  Plan: RN Health Coach will send primary care provider Quarterly Update. RN Health Coach will send MD case closure letter. RN Health Coach will send patient case closure letter. RN Health Coach will close case based on wife and patient's request.  Hubert Azure RN [place] [phone] [email or web address]

## 2018-06-11 NOTE — Progress Notes (Signed)
Chief Complaint  Patient presents with  . Follow-up    CAD    History of Present Illness: 73 yo male with history of CAD, HTN, hyperlipidemia, persistent atrial fibrillation, AAA and DM who is here today for cardiac follow up. In 2001 he had an anterior STEMI treated with a bare-metal stent in the LAD. He underwent catheterization in 2006 and the stent was patent and there was an 80% narrowing in the diagonal branch which was treated medically. He was noted to be in atrial fibrillation in October 2013 and was started on Xarelto and DCCV was arranged on 03/27/12 with return to sinus rhythm. He has since converted back to atrial fibrillation and has been controlled on metoprolol.  AAA followed in [facility]. Echo July 2018 with normal LV size and function with no significant valve disease. 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 He had colostomy reversal in October 2019.   He is here today for follow up. The patient denies any chest pain, dyspnea, palpitations, lower extremity edema, orthopnea, PND, dizziness, near syncope or syncope.     Primary Care Physician: Cyndi Bender, PA-C  Past Medical History:  Diagnosis Date  . AAA (abdominal aortic aneurysm) ([place])   . Allergy   . Atrial fibrillation [facility]) October 2013   Xarelto started; duration unknown  . Cataract   . Chronic kidney disease    Stage III   . Coronary artery disease    post bare-metal stent to LAD in 2001  . Diabetes mellitus    type 2   . Hypertension   . Myocardial infarct ([place]) 2001  . Obesity     Past Surgical History:  Procedure Laterality Date  . CARDIAC CATHETERIZATION  2006   stable with primarily nonobstructive disease  . CARDIOVERSION  03/27/2012   Procedure: CARDIOVERSION;  Surgeon: Carlena Bjornstad, MD;  Location: [facility] ENDOSCOPY;  Service: Cardiovascular;  Laterality: N/A;  . COLOSTOMY TAKEDOWN N/A 03/08/2018   Procedure: LAPAROSCOPIC HARTMANNS REVERSAL;  Surgeon: Ileana Roup, MD;  Location: [facility] ORS;  Service: General;  Laterality: N/A;  . CORONARY ANGIOPLASTY WITH STENT PLACEMENT  2001   bare-metal stent to the LAD  . CYSTOSCOPY W/ URETERAL STENT PLACEMENT Bilateral 03/08/2018   Procedure: CYSTOSCOPY WITH RETROGRADE AND BILATERAL URETERAL STENT PLACEMENT;  Surgeon: Lucas Mallow, MD;  Location: [facility] ORS;  Service: Urology;  Laterality: Bilateral;  . CYSTOSCOPY WITH STENT PLACEMENT Bilateral 06/03/2017   Procedure: CYSTOSCOPY WITH STENT PLACEMENT;  Surgeon: Festus Aloe, MD;  Location: [facility] ORS;  Service: Urology;  Laterality: Bilateral;  . FLEXIBLE SIGMOIDOSCOPY N/A 03/08/2018   Procedure: FLEXIBLE SIGMOIDOSCOPY;  Surgeon: Ileana Roup, MD;  Location: [facility] ORS;  Service: General;  Laterality: N/A;  . HERNIA REPAIR  2006  . LAPAROSCOPIC PARASTOMAL HERNIA N/A 03/08/2018   Procedure: PARASTOMAL HERNIA REPAIR;  Surgeon: Ileana Roup, MD;  Location: [facility] ORS;  Service: General;  Laterality: N/A;  . LAPAROSCOPIC SIGMOID COLECTOMY N/A 06/03/2017   Procedure: LAPAROSCOPIC  SIGMOID COLECTOMY, COLOSTOMY;  Surgeon: Ileana Roup, MD;  Location: [facility] ORS;  Service: General;  Laterality: N/A;  . SKIN GRAFT  1981    Current Outpatient Medications  Medication Sig Dispense Refill  . atorvastatin (LIPITOR) 20 MG tablet Take 20 mg by mouth at bedtime.     . Dulaglutide (TRULICITY) 1.5 HT/9.7FS SOPN INJECT SUBCUTANEOUSLY 1.5  MG WEEKLY 12 pen 3  . ferrous sulfate 325 (65 FE) MG tablet Take  325 mg by mouth 3 (three) times a week.    . Insulin Isophane & Regular Human (HUMULIN 70/30 KWIKPEN) (70-30) 100 UNIT/ML PEN INJECT SUBCUTANEOUSLY 25  UNITS BEFORE BREAKFAST AND  25 UNITS BEFORE DINNER 45 mL 3  . metFORMIN (GLUCOPHAGE) 500 MG tablet Take 1 tablet (500 mg total) by mouth daily with breakfast. 90 tablet 3  . metoprolol tartrate (LOPRESSOR) 25 MG tablet Take 0.5 tablets (12.5 mg total) by mouth 2 (two) times daily. 90 tablet 2  . nitroGLYCERIN  (NITROSTAT) 0.4 MG SL tablet Place 0.4 mg under the tongue every 5 (five) minutes as needed for chest pain.     Marland Kitchen omeprazole (PRILOSEC) 20 MG capsule Take 20 mg by mouth daily as needed (for acid reflux/indigestion).     . Probiotic Product (PROBIOTIC-10 PO) Take 1 capsule by mouth 3 (three) times a week.     . ramipril (ALTACE) 10 MG capsule Take 10 mg by mouth daily.    Marland Kitchen RELION PEN NEEDLES 32G X 4 MM MISC USE TWO TIMES A DAY AS ADVISED 100 each 7  . rivaroxaban (XARELTO) 20 MG TABS tablet Take 1 tablet (20 mg total) by mouth daily. 90 tablet 3   No current facility-administered medications for this visit.     No Known Allergies  Social History   Socioeconomic History  . Marital status: Married    Spouse name: Not on file  . Number of children: Not on file  . Years of education: Not on file  . Highest education level: Not on file  Occupational History  . Not on file  Social Needs  . Financial resource strain: Not on file  . Food insecurity:    Worry: Not on file    Inability: Not on file  . Transportation needs:    Medical: Not on file    Non-medical: Not on file  Tobacco Use  . Smoking status: Former Smoker    Packs/day: 0.50    Years: 40.00    Pack years: 20.00    Types: Cigarettes, Pipe, Cigars    Last attempt to quit: 05/11/1999    Years since quitting: 19.1  . Smokeless tobacco: Never Used  Substance and Sexual Activity  . Alcohol use: No  . Drug use: No  . Sexual activity: Yes  Lifestyle  . Physical activity:    Days per week: Not on file    Minutes per session: Not on file  . Stress: Not on file  Relationships  . Social connections:    Talks on phone: Not on file    Gets together: Not on file    Attends religious service: Not on file    Active member of club or organization: Not on file    Attends meetings of clubs or organizations: Not on file    Relationship status: Not on file  . Intimate partner violence:    Fear of current or ex partner: Not on  file    Emotionally abused: Not on file    Physically abused: Not on file    Forced sexual activity: Not on file  Other Topics Concern  . Not on file  Social History Narrative   Married    Retired: [employer]   3 grown children    Family History  Problem Relation Age of Onset  . Heart disease Father   . Heart attack Father   . ALS Mother   . Diabetes Sister   . Hypertension Sister   . Heart failure  Brother   . Stroke Neg Hx     Review of Systems:  As stated in the HPI and otherwise negative.   BP 118/70   Pulse 80   Ht 6\' 3"  (1.905 m)   Wt 230 lb 1.9 oz (104.4 kg)   SpO2 97%   BMI 28.76 kg/m   Physical Examination: General: Well developed, well nourished, NAD  HEENT: OP clear, mucus membranes moist  SKIN: warm, dry. No rashes. Neuro: No focal deficits  Musculoskeletal: Muscle strength 5/5 all ext  Psychiatric: Mood and affect normal  Neck: No JVD, no carotid bruits, no thyromegaly, no lymphadenopathy.  Lungs:Clear bilaterally, no wheezes, rhonci, crackles Cardiovascular: Irreg irreg. No murmurs, gallops or rubs. Abdomen:Soft. Bowel sounds present. Non-tender.  Extremities: No lower extremity edema. Pulses are 2 + in the bilateral DP/PT.  Echo 11/17/16: .- Left ventricle: The cavity size was normal. There was moderate   concentric hypertrophy. Systolic function was normal. The   estimated ejection fraction was in the range of 50% to 55%. Mild   hypokinesis of the inferoseptal and apical inferior myocardium. - Aortic valve: Transvalvular velocity was within the normal range.   There was no stenosis. There was no regurgitation. - Mitral valve: Transvalvular velocity was within the normal range.   There was no evidence for stenosis. There was no regurgitation. - Left atrium: The atrium was severely dilated. - Right ventricle: The cavity size was normal. Wall thickness was   normal. Systolic function was normal. - Right atrium: The atrium was severely dilated. - Atrial  septum: No defect or patent foramen ovale was identified   by color flow Doppler. - Tricuspid valve: There was mild regurgitation. - Pulmonary arteries: Systolic pressure was within the normal   range. PA peak pressure: 35 mm Hg (S).  EKG:  EKG is ordered today. The ekg ordered today demonstrates  Recent Labs: 03/02/2018: ALT 17 03/12/2018: BUN 19; Creatinine, Ser 1.38; Hemoglobin 11.3; Platelets 151; Potassium 3.9; Sodium 137   Lipid Panel Followed in primary care   Wt Readings from Last 3 Encounters:  06/12/18 230 lb 1.9 oz (104.4 kg)  03/29/18 228 lb (103.4 kg)  03/10/18 229 lb 0.9 oz (103.9 kg)     Other studies Reviewed: Additional studies/ records that were reviewed today include:  Review of the above records demonstrates:  Assessment and Plan:   1. CAD without angina: No chest pain. No ASA since he is on Xarelto. Continue statin and beta blocker.    2. Atrial fibrillation, persistent: Atrial fib today. Rate is controlled. Continue beta blocker and Xarelto.     3. HYPERTENSION: BP is controlled. No changes today  Current medicines are reviewed at length with the patient today.  The patient does not have concerns regarding medicines.  The following changes have been made:  no change  Labs/ tests ordered today include:  Orders Placed This Encounter  Procedures  . EKG 12-Lead    Disposition:   FU with me in 6 months  Signed, Lauree Chandler, MD 06/12/2018 10:00 AM    [facility] [address], [place], [place]  [address] Phone: [phone]; Fax: [phone]

## 2018-06-12 ENCOUNTER — Encounter: Payer: Self-pay | Admitting: Cardiovascular Disease

## 2018-06-12 ENCOUNTER — Ambulatory Visit: Payer: Medicare Other | Admitting: Cardiovascular Disease

## 2018-06-12 VITALS — BP 118/70 | HR 80 | Ht 75.0 in | Wt 230.1 lb

## 2018-06-12 DIAGNOSIS — I4819 Other persistent atrial fibrillation: Secondary | ICD-10-CM | POA: Diagnosis not present

## 2018-06-12 DIAGNOSIS — I10 Essential (primary) hypertension: Secondary | ICD-10-CM

## 2018-06-12 DIAGNOSIS — I2510 Atherosclerotic heart disease of native coronary artery without angina pectoris: Secondary | ICD-10-CM | POA: Diagnosis not present

## 2018-06-12 MED ORDER — RIVAROXABAN 20 MG PO TABS: 20.0000 mg | ORAL_TABLET | Freq: Every day | ORAL | 3 refills | Status: DC

## 2018-06-12 NOTE — Patient Instructions (Signed)

## 2018-07-31 ENCOUNTER — Other Ambulatory Visit: Payer: Self-pay

## 2018-08-01 ENCOUNTER — Other Ambulatory Visit: Payer: Self-pay

## 2018-08-01 ENCOUNTER — Encounter: Payer: Self-pay | Admitting: Internal Medicine

## 2018-08-01 ENCOUNTER — Ambulatory Visit: Payer: Medicare Other | Admitting: Internal Medicine

## 2018-08-01 VITALS — BP 120/68 | HR 79 | Temp 98.4°F | Ht 75.0 in | Wt 232.0 lb

## 2018-08-01 DIAGNOSIS — E785 Hyperlipidemia, unspecified: Secondary | ICD-10-CM | POA: Diagnosis not present

## 2018-08-01 DIAGNOSIS — IMO0002 Reserved for concepts with insufficient information to code with codable children: Secondary | ICD-10-CM

## 2018-08-01 DIAGNOSIS — E1165 Type 2 diabetes mellitus with hyperglycemia: Secondary | ICD-10-CM | POA: Diagnosis not present

## 2018-08-01 DIAGNOSIS — E1151 Type 2 diabetes mellitus with diabetic peripheral angiopathy without gangrene: Secondary | ICD-10-CM

## 2018-08-01 DIAGNOSIS — E663 Overweight: Secondary | ICD-10-CM | POA: Diagnosis not present

## 2018-08-01 LAB — POCT GLYCOSYLATED HEMOGLOBIN (HGB A1C): Hemoglobin A1C: 6.8 % — AB (ref 4.0–5.6)

## 2018-08-01 MED ORDER — INSULIN ISOPHANE & REGULAR (HUMAN 70-30)100 UNIT/ML KWIKPEN: PEN_INJECTOR | SUBCUTANEOUS | 3 refills | Status: DC

## 2018-08-01 NOTE — Addendum Note (Signed)
Addended by: Cardell Peach I on: 08/01/2018 11:39 AM   Modules accepted: Orders

## 2018-08-01 NOTE — Patient Instructions (Addendum)
Please continue: - Trulicity 1.5 mg weekly  Please increase: - Insulin 70/30 to 28 units before breakfast and dinner  Please return in 4 months with your sugar log.

## 2018-08-01 NOTE — Progress Notes (Signed)
Patient ID: BELINDA BRINGHURST, male   DOB: [birth year], 73 y.o.   MRN: [id number]  HPI: OHM DENTLER is a 73 y.o.-year-old male, returning for f/u for DM2, dx 1995, insulin-dependent, uncontrolled, with complications (CKD stage 3, CAD - AMI, s/p stent LAD 2001). Last visit 4 months ago.  He is here with his wife offers part of the history, especially related to his blood sugars, insulin doses, and diet.  Before last visit, he started to exercise at the gym- walking on the treadmill. Now walking outside.  Last hemoglobin A1c was: Lab Results  Component Value Date   HGBA1C 7.4 (H) 03/02/2018   HGBA1C 7.4 (A) 11/30/2017   HGBA1C 5.5 05/31/2017   He is on: - Trulicity 1.5 mg weekly - Insulin 70/30 25 units before breakfast and dinner We tried low-dose metformin ER several times, but he could not tolerate it due to diarrhea >> now diarrhea is better He was on Tanzeum 50 mg weekly (started 09/2015) - >200$ per month! >> Trulicity 1.5 mg weekly He was previously on glipizide-metformin but could not tolerate it due to diarrhea -stopped 08/2017  Pt checks his sugars -3 times a day: - am92, 115-187 >> 121, 145-177, 186, 190 (buttemilk + banana at night - for diarrhea) >> 109, 131-179 - 2h after b'fast: 131 >> n/c >> 223 >> n/c  - before lunch: 198, 215 >> n/c - 2h after lunch: 184, 231 >> 179-228 >> n/c >> 191-228 - before dinner: 152-244 >> 90, 130-209, 222 >> 83, 106-175, 202, 221 - 2h after dinner:  n/c >> 176 >> 196 >> n/c - bedtime:76, 181 >> 212 >> 142-203 - nighttime:43-67, 156 >> 74-109 >> 90, 120 >> n/c Lowest sugar was 59 at bedtime >> 90 >> 85; he does not have hypoglycemia awareness! Highest sugar was 249 >> 222 >> 228.  Pt's meals are: - Breakfast: eggs, toast, ham/sausage or cereals + milk - Lunch: sandwich, vegetables, meat - Dinner: seafood, meat, vegetables - Snacks: nabs  Reviewed labs from PCP: 04/26/2018: CMP normal with the exception of a slightly high glucose of 124,  BUN/creatinine 19/1.5, GFR 46 and slightly higher chloride, of 107. Lipids: 147/58/47/88 CBC with slightly low red blood cells of 4.08 (4.14-5.8), normal hemoglobin of 13.3 and slightly high MCV of 99 (79-97) PSA 0.8  04/13/2017: - Lipids: 124/65/39/72 - CMP normal, glucose 98, BUN/creatinine 10/1.3, GFR 55, potassium 3.4 (3.5-5.2), Ca 8.1 (8.6-10.2) - CBC with differential: low Hb 11.3 (13-17.7), low RBC 3.57 (4.14-5.8) - ferritin 167 - PSA 0.5  09/30/2015: - Hep C virus antibody <0.1 - Lipids: 126/68/44/68 - CMP normal, except glucose 209, BUN/creatinine 22/1.39, GFR 51, potassium 5.4 (3.5-5.2), CO2 17 (18-29) - CBC with differential normal - B12 vitamin 305 (00-867)  04/01/2015: - ACR 56.3 - CBC with differential normal - Lipids: 116/56/42/63 - CMP: Glucose 193, BUN/creatinine 21/1.23, GFR 60, LFTs normal - PSA 0.5    -+ CKD stage III Lab Results  Component Value Date   BUN 19 03/12/2018   CREATININE 1.38 (H) 03/12/2018  On ramipril -+ HL - on Lipitor  - last eye exam was in 04/2018: No DR - no numbness and tingling in his feet.   He has a history of diverticulitis (sees Dr. Jaydan Chretien Gong).  He had a colectomy in 05/2017 and had to have a colostomy bag.  He was feeling better after his colectomy.  He had hernia repair surgery and reversal of his ostomy 03/08/2018.  ROS: Constitutional: no weight gain/no  weight loss, no fatigue, no subjective hyperthermia, no subjective hypothermia Eyes: no blurry vision, no xerophthalmia ENT: no sore throat, no nodules palpated in neck, no dysphagia, no odynophagia, no hoarseness Cardiovascular: no CP/no SOB/no palpitations/no leg swelling Respiratory: no cough/no SOB/no wheezing Gastrointestinal: no N/no V/no D/no C/no acid reflux Musculoskeletal: no muscle aches/no joint aches Skin: no rashes, no hair loss Neurological: no tremors/no numbness/no tingling/no dizziness  I reviewed pt's medications, allergies, PMH, social hx, family hx,  and changes were documented in the history of present illness. Otherwise, unchanged from my initial visit note.  Past Medical History:  Diagnosis Date  . AAA (abdominal aortic aneurysm) ([place])   . Allergy   . Atrial fibrillation [facility]) October 2013   Xarelto started; duration unknown  . Cataract   . Chronic kidney disease    Stage III   . Coronary artery disease    post bare-metal stent to LAD in 2001  . Diabetes mellitus    type 2   . Hypertension   . Myocardial infarct ([facility]) 2001  . Obesity    Past Surgical History:  Procedure Laterality Date  . CARDIAC CATHETERIZATION  2006   stable with primarily nonobstructive disease  . CARDIOVERSION  03/27/2012   Procedure: CARDIOVERSION;  Surgeon: Carlena Bjornstad, MD;  Location: [facility] ENDOSCOPY;  Service: Cardiovascular;  Laterality: N/A;  . COLOSTOMY TAKEDOWN N/A 03/08/2018   Procedure: LAPAROSCOPIC HARTMANNS REVERSAL;  Surgeon: Ileana Roup, MD;  Location: [facility] ORS;  Service: General;  Laterality: N/A;  . CORONARY ANGIOPLASTY WITH STENT PLACEMENT  2001   bare-metal stent to the LAD  . CYSTOSCOPY W/ URETERAL STENT PLACEMENT Bilateral 03/08/2018   Procedure: CYSTOSCOPY WITH RETROGRADE AND BILATERAL URETERAL STENT PLACEMENT;  Surgeon: Lucas Mallow, MD;  Location: [facility] ORS;  Service: Urology;  Laterality: Bilateral;  . CYSTOSCOPY WITH STENT PLACEMENT Bilateral 06/03/2017   Procedure: CYSTOSCOPY WITH STENT PLACEMENT;  Surgeon: Festus Aloe, MD;  Location: [facility] ORS;  Service: Urology;  Laterality: Bilateral;  . FLEXIBLE SIGMOIDOSCOPY N/A 03/08/2018   Procedure: FLEXIBLE SIGMOIDOSCOPY;  Surgeon: Ileana Roup, MD;  Location: [facility] ORS;  Service: General;  Laterality: N/A;  . HERNIA REPAIR  2006  . LAPAROSCOPIC PARASTOMAL HERNIA N/A 03/08/2018   Procedure: PARASTOMAL HERNIA REPAIR;  Surgeon: Ileana Roup, MD;  Location: [facility] ORS;  Service: General;  Laterality: N/A;  . LAPAROSCOPIC SIGMOID COLECTOMY N/A 06/03/2017   Procedure:  LAPAROSCOPIC  SIGMOID COLECTOMY, COLOSTOMY;  Surgeon: Ileana Roup, MD;  Location: [facility] ORS;  Service: General;  Laterality: N/A;  . SKIN GRAFT  1981   Social History Main Topics  . Smoking status: Former Smoker -- 0.50 packs/day for 40 years    Types: Cigarettes, Pipe, Cigars    Quit date: 05/11/1999  . Smokeless tobacco: Never Used  . Alcohol Use: No  . Drug Use: No   Social History Narrative   Married    Retired: [employer]   3 grown children   Current Outpatient Medications on File Prior to Visit  Medication Sig Dispense Refill  . atorvastatin (LIPITOR) 20 MG tablet Take 20 mg by mouth at bedtime.     . Dulaglutide (TRULICITY) 1.5 ZO/1.0RU SOPN INJECT SUBCUTANEOUSLY 1.5  MG WEEKLY 12 pen 3  . ferrous sulfate 325 (65 FE) MG tablet Take 325 mg by mouth 3 (three) times a week.    . Insulin Isophane & Regular Human (HUMULIN 70/30 KWIKPEN) (70-30) 100 UNIT/ML PEN INJECT SUBCUTANEOUSLY 25  UNITS BEFORE BREAKFAST AND  25  UNITS BEFORE DINNER 45 mL 3  . metFORMIN (GLUCOPHAGE) 500 MG tablet Take 1 tablet (500 mg total) by mouth daily with breakfast. 90 tablet 3  . metoprolol tartrate (LOPRESSOR) 25 MG tablet Take 0.5 tablets (12.5 mg total) by mouth 2 (two) times daily. 90 tablet 2  . nitroGLYCERIN (NITROSTAT) 0.4 MG SL tablet Place 0.4 mg under the tongue every 5 (five) minutes as needed for chest pain.     Marland Kitchen omeprazole (PRILOSEC) 20 MG capsule Take 20 mg by mouth daily as needed (for acid reflux/indigestion).     . Probiotic Product (PROBIOTIC-10 PO) Take 1 capsule by mouth 3 (three) times a week.     . ramipril (ALTACE) 10 MG capsule Take 10 mg by mouth daily.    Marland Kitchen RELION PEN NEEDLES 32G X 4 MM MISC USE TWO TIMES A DAY AS ADVISED 100 each 7  . rivaroxaban (XARELTO) 20 MG TABS tablet Take 1 tablet (20 mg total) by mouth daily. 90 tablet 3   No current facility-administered medications on file prior to visit.    No Known Allergies Family History  Problem Relation Age of Onset  . Heart  disease Father   . Heart attack Father   . ALS Mother   . Diabetes Sister   . Hypertension Sister   . Heart failure Brother   . Stroke Neg Hx    PE: BP 120/68   Pulse 79   Temp 98.4 F (36.9 C)   Ht 6\' 3"  (1.905 m)   Wt 232 lb (105.2 kg)   SpO2 95%   BMI 29.00 kg/m  Body mass index is 29 kg/m. Wt Readings from Last 3 Encounters:  08/01/18 232 lb (105.2 kg)  06/12/18 230 lb 1.9 oz (104.4 kg)  03/29/18 228 lb (103.4 kg)   Constitutional: overweight, in NAD Eyes: PERRLA, EOMI, no exophthalmos ENT: moist mucous membranes, no thyromegaly, no cervical lymphadenopathy Cardiovascular: RRR, No MRG Respiratory: CTA B Gastrointestinal: abdomen soft, NT, ND, BS+ Musculoskeletal: no deformities, strength intact in all 4 Skin: moist, warm, no rashes Neurological: no tremor with outstretched hands, DTR normal in all 4  ASSESSMENT: 1. DM2, insulin-dependent, uncontrolled, with complications - CAD - CKD stage 3  2. HL  3.  Overweight  PLAN:  1. Patient with longstanding, previously uncontrolled diabetes, now with improved control on the GLP-1 receptor agonist and premixed insulin regimen.  We tried small doses of metformin in the past, but he still had occasional diarrhea with metformin so I advised him to stop this at last visit.  Since we stopped metformin, I advised him to increase the 70/30 insulin doses before dinner to hopefully improve the sugars in the morning.  Sugars before dinner were also higher than goal, but this may have been because he had a snack in the afternoon.  We discussed about cutting down snacks. -At this visit, sugars are slightly better, with the exception of the times when he has a late lunch or snack before dinner, and his predinner CBGs are high.  We discussed about reducing snacks.  Since the sugars in the morning are also above target, I suggested to increase the dose of his premixed insulin.  We will continue Trulicity, which he tolerates well.  He is now  off metformin and feels that his diarrhea is improved. - I suggested to:  Patient Instructions  Please continue: - Trulicity 1.5 mg weekly  Please increase: - Insulin 70/30 to 28 units before breakfast and dinner  Please return  in 4 months with your sugar log.   - today, HbA1c is 6.8% (better) - continue checking sugars at different times of the day - check 2-3x a day, rotating checks - advised for yearly eye exams >> he is UTD - Return to clinic in 3 mo with sugar log        2. HL -Reviewed most recent lipid panel from 04/2018: All fractions at goal -Continues Lipitor without side effects  3.  Overweight -Continue Trulicity which should also help with weight loss - gained 4 lbs since last OV  Philemon Kingdom, MD PhD [facility] Endocrinology

## 2018-08-04 ENCOUNTER — Other Ambulatory Visit: Payer: Self-pay | Admitting: Internal Medicine

## 2018-08-13 IMAGING — DX DG CHEST 1V
1 series · 1 of 1 positions shown · non-contrast
Comparison: 03/21/2012

CLINICAL DATA: Status post colectomy and stent placement 2 days ago

EXAM:
CHEST 1 VIEW

[chest ap]
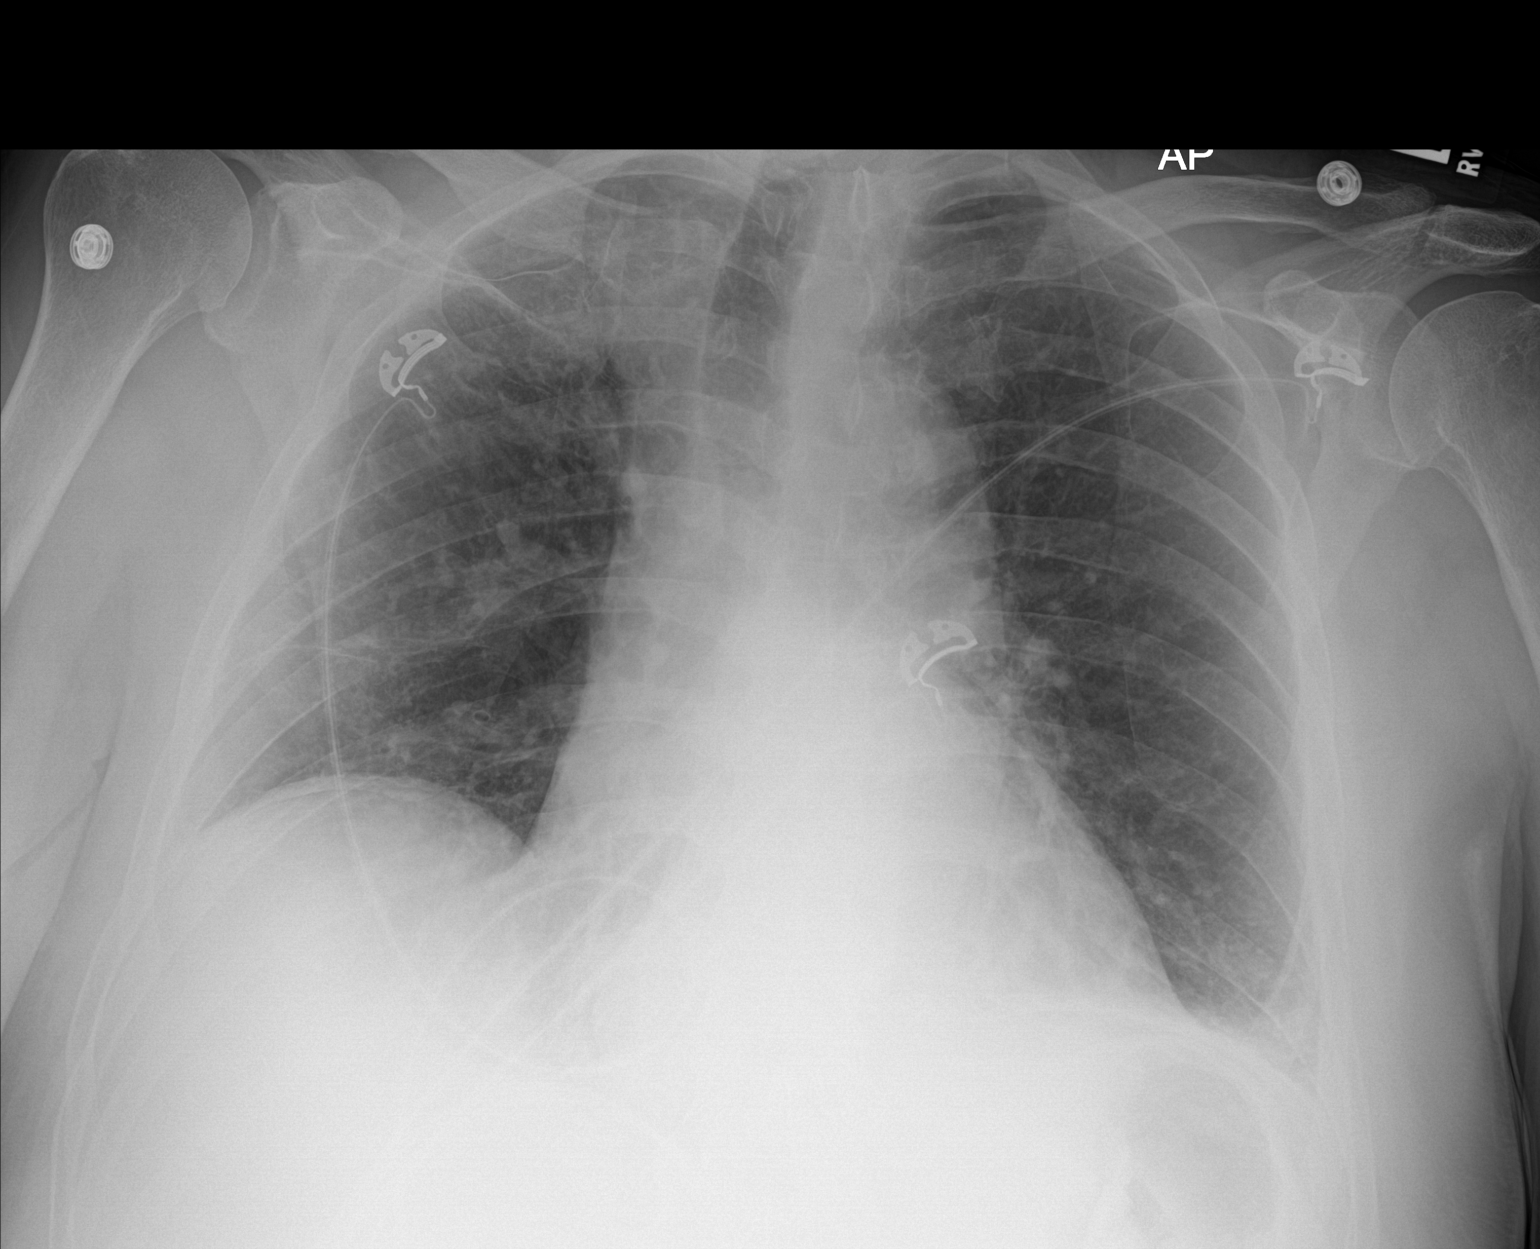

[1 of 1 positions shown; findings below may reference images not displayed]

FINDINGS: Cardiac shadow is mildly prominent accentuated by the portable
technique. Mild bibasilar changes are seen. Free air is noted
beneath the right hemidiaphragm consistent with the recent
colectomy. No focal infiltrate or sizable effusion is seen. No bony
abnormality is noted.
IMPRESSION: No acute intrathoracic abnormality noted.

Mild free air consistent with the recent colectomy.

## 2018-09-17 ENCOUNTER — Other Ambulatory Visit: Payer: Self-pay | Admitting: Internal Medicine

## 2018-11-22 ENCOUNTER — Other Ambulatory Visit: Payer: Self-pay

## 2018-11-22 ENCOUNTER — Other Ambulatory Visit: Payer: Self-pay | Admitting: Internal Medicine

## 2018-11-24 ENCOUNTER — Encounter: Payer: Self-pay | Admitting: Internal Medicine

## 2018-11-24 ENCOUNTER — Other Ambulatory Visit: Payer: Self-pay

## 2018-11-24 ENCOUNTER — Ambulatory Visit: Payer: Medicare Other | Admitting: Internal Medicine

## 2018-11-24 VITALS — BP 110/60 | HR 77 | Ht 75.0 in | Wt 237.0 lb

## 2018-11-24 DIAGNOSIS — E1151 Type 2 diabetes mellitus with diabetic peripheral angiopathy without gangrene: Secondary | ICD-10-CM | POA: Diagnosis not present

## 2018-11-24 DIAGNOSIS — E663 Overweight: Secondary | ICD-10-CM | POA: Diagnosis not present

## 2018-11-24 DIAGNOSIS — E1165 Type 2 diabetes mellitus with hyperglycemia: Secondary | ICD-10-CM | POA: Diagnosis not present

## 2018-11-24 DIAGNOSIS — E785 Hyperlipidemia, unspecified: Secondary | ICD-10-CM

## 2018-11-24 DIAGNOSIS — IMO0002 Reserved for concepts with insufficient information to code with codable children: Secondary | ICD-10-CM

## 2018-11-24 LAB — POCT GLYCOSYLATED HEMOGLOBIN (HGB A1C): Hemoglobin A1C: 7.5 % — AB (ref 4.0–5.6)

## 2018-11-24 NOTE — Patient Instructions (Signed)
Please continue: - Trulicity 1.5 mg weekly  Please increase: - Insulin 70/30 to 32 units before breakfast and dinner When you start going to the gym again, may need to decrease the insulin back to 28 units 2x a day.  Please return in 4 months with your sugar log.

## 2018-11-24 NOTE — Progress Notes (Signed)
Patient ID: [name], male   DOB: [birth year], 73 y.o.   MRN: [id number]  HPI: [name] is a 73 y.o.-year-old male, returning for f/u for DM2, dx 1995, insulin-dependent, uncontrolled, with complications (CKD stage 3, CAD - AMI, s/p stent LAD 2001). Last visit 4 months ago.  He is here with his wife (who is also my patient) and offers part of the history, especially related to his blood sugars, insulin doses, and diet.  He is less active now that he cannot go to the gym due to the coronavirus pandemic.  He is trying to walk almost 2 miles but not consistently due to the hot weather.  Last hemoglobin A1c was: Lab Results  Component Value Date   HGBA1C 6.8 (A) 08/01/2018   HGBA1C 7.4 (H) 03/02/2018   HGBA1C 7.4 (A) 11/30/2017   He is on: - Trulicity 1.5 mg weekly - Insulin 70/30 25 >> 28 units before breakfast and dinner We tried low-dose metformin ER several times, but he could not tolerate it due to diarrhea >> now diarrhea is better He was on Tanzeum 50 mg weekly (started 09/2015) - >200$ per month! >> Trulicity 1.5 mg weekly He was previously on glipizide-metformin but could not tolerate it due to diarrhea -stopped 08/2017  Pt checks his sugars -3 times a day: - am: 121, 145-177, 186, 190 >> 109, 131-179 >> 117, 146-186 - 2h after b'fast: 131 >> n/c >> 223 >> n/c  - before lunch: 198, 215 >> n/c - 2h after lunch: 184, 231 >> 179-228 >> n/c >> 191-228 >> 202, 237 - before dinner: 90, 130-209, 222 >> 83, 106-175, 202, 221 >> 107, 138-228, but lately: 155-183) - 2h after dinner:  n/c >> 176 >> 196 >> n/c - bedtime:76, 181 >> 212 >> 142-203 - nighttime:43-67, 156 >> 74-109 >> 90, 120 >> n/c Lowest sugar was 59 at bedtime >> 90 >> 85 >> 107; he does not have hypoglycemia awareness! Highest sugar was 228 >> 237.  Pt's meals are: - Breakfast: eggs, toast, ham/sausage or cereals + milk - Lunch: sandwich, vegetables, meat - Dinner: seafood, meat, vegetables - Snacks:  nabs  Reviewed labs from PCP: 04/26/2018: CMP normal with the exception of a slightly high glucose of 124, BUN/creatinine 19/1.5, GFR 46 and slightly higher chloride, of 107. Lipids: 147/58/47/88 CBC with slightly low red blood cells of 4.08 (4.14-5.8), normal hemoglobin of 13.3 and slightly high MCV of 99 (79-97) PSA 0.8  04/13/2017: - Lipids: 124/65/39/72 - CMP normal, glucose 98, BUN/creatinine 10/1.3, GFR 55, potassium 3.4 (3.5-5.2), Ca 8.1 (8.6-10.2) - CBC with differential: low Hb 11.3 (13-17.7), low RBC 3.57 (4.14-5.8) - ferritin 167 - PSA 0.5  09/30/2015: - Hep C virus antibody <0.1 - Lipids: 126/68/44/68 - CMP normal, except glucose 209, BUN/creatinine 22/1.39, GFR 51, potassium 5.4 (3.5-5.2), CO2 17 (18-29) - CBC with differential normal - B12 vitamin 305 (86-767)  04/01/2015: - ACR 56.3 - CBC with differential normal - Lipids: 116/56/42/63 - CMP: Glucose 193, BUN/creatinine 21/1.23, GFR 60, LFTs normal - PSA 0.5    - + CKD stage III Lab Results  Component Value Date   BUN 19 03/12/2018   CREATININE 1.38 (H) 03/12/2018  On ramipril. -+ HL-on Lipitor  - last eye exam was in 04/2018: No DR - no numbness and tingling in his feet.   He has a history of diverticulitis (sees Dr. Rethel Sebek Bartlett).  He had a colectomy in 05/2017 and had to have a colostomy bag.  He was feeling better after his colectomy.  He had hernia repair surgery and reversal of his ostomy 03/08/2018.  ROS: Constitutional: no weight gain/no weight loss, no fatigue, no subjective hyperthermia, no subjective hypothermia Eyes: no blurry vision, no xerophthalmia ENT: no sore throat, no nodules palpated in neck, no dysphagia, no odynophagia, no hoarseness Cardiovascular: no CP/no SOB/no palpitations/no leg swelling Respiratory: no cough/no SOB/no wheezing Gastrointestinal: no N/no V/no D/no C/no acid reflux Musculoskeletal: no muscle aches/no joint aches Skin: no rashes, no hair loss Neurological: no  tremors/no numbness/no tingling/no dizziness  I reviewed pt's medications, allergies, PMH, social hx, family hx, and changes were documented in the history of present illness. Otherwise, unchanged from my initial visit note.  Past Medical History:  Diagnosis Date  . AAA (abdominal aortic aneurysm) ([place])   . Allergy   . Atrial fibrillation [facility]) October 2013   Xarelto started; duration unknown  . Cataract   . Chronic kidney disease    Stage III   . Coronary artery disease    post bare-metal stent to LAD in 2001  . Diabetes mellitus    type 2   . Hypertension   . Myocardial infarct ([place]) 2001  . Obesity    Past Surgical History:  Procedure Laterality Date  . CARDIAC CATHETERIZATION  2006   stable with primarily nonobstructive disease  . CARDIOVERSION  03/27/2012   Procedure: CARDIOVERSION;  Surgeon: Carlena Bjornstad, MD;  Location: [facility] ENDOSCOPY;  Service: Cardiovascular;  Laterality: N/A;  . COLOSTOMY TAKEDOWN N/A 03/08/2018   Procedure: LAPAROSCOPIC HARTMANNS REVERSAL;  Surgeon: Ileana Roup, MD;  Location: [facility] ORS;  Service: General;  Laterality: N/A;  . CORONARY ANGIOPLASTY WITH STENT PLACEMENT  2001   bare-metal stent to the LAD  . CYSTOSCOPY W/ URETERAL STENT PLACEMENT Bilateral 03/08/2018   Procedure: CYSTOSCOPY WITH RETROGRADE AND BILATERAL URETERAL STENT PLACEMENT;  Surgeon: Lucas Mallow, MD;  Location: [facility] ORS;  Service: Urology;  Laterality: Bilateral;  . CYSTOSCOPY WITH STENT PLACEMENT Bilateral 06/03/2017   Procedure: CYSTOSCOPY WITH STENT PLACEMENT;  Surgeon: Festus Aloe, MD;  Location: [facility]S;  Service: Urology;  Laterality: Bilateral;  . FLEXIBLE SIGMOIDOSCOPY N/A 03/08/2018   Procedure: FLEXIBLE SIGMOIDOSCOPY;  Surgeon: Ileana Roup, MD;  Location: [facility]S;  Service: General;  Laterality: N/A;  . HERNIA REPAIR  2006  . LAPAROSCOPIC PARASTOMAL HERNIA N/A 03/08/2018   Procedure: PARASTOMAL HERNIA REPAIR;  Surgeon: Ileana Roup, MD;   Location: [facility]S;  Service: General;  Laterality: N/A;  . LAPAROSCOPIC SIGMOID COLECTOMY N/A 06/03/2017   Procedure: LAPAROSCOPIC  SIGMOID COLECTOMY, COLOSTOMY;  Surgeon: Ileana Roup, MD;  Location: [facility] ORS;  Service: General;  Laterality: N/A;  . SKIN GRAFT  1981   Social History Main Topics  . Smoking status: Former Smoker -- 0.50 packs/day for 40 years    Types: Cigarettes, Pipe, Cigars    Quit date: 05/11/1999  . Smokeless tobacco: Never Used  . Alcohol Use: No  . Drug Use: No   Social History Narrative   Married    Retired: [employer]   3 grown children   Current Outpatient Medications on File Prior to Visit  Medication Sig Dispense Refill  . atorvastatin (LIPITOR) 20 MG tablet Take 20 mg by mouth at bedtime.     . Dulaglutide (TRULICITY) 1.5 ST/4.1DQ SOPN INJECT SUBCUTANEOUSLY 1.5  MG WEEKLY 12 pen 3  . ferrous sulfate 325 (65 FE) MG tablet Take 325 mg by mouth 3 (three) times a week.    Marland Kitchen  Insulin Isophane & Regular Human (HUMULIN 70/30 KWIKPEN) (70-30) 100 UNIT/ML PEN INJECT SUBCUTANEOUSLY 28  UNITS BEFORE BREAKFAST AND 28 UNITS BEFORE DINNER 45 mL 3  . metoprolol tartrate (LOPRESSOR) 25 MG tablet Take 0.5 tablets (12.5 mg total) by mouth 2 (two) times daily. 90 tablet 2  . nitroGLYCERIN (NITROSTAT) 0.4 MG SL tablet Place 0.4 mg under the tongue every 5 (five) minutes as needed for chest pain.     Marland Kitchen omeprazole (PRILOSEC) 20 MG capsule Take 20 mg by mouth daily as needed (for acid reflux/indigestion).     . Probiotic Product (PROBIOTIC-10 PO) Take 1 capsule by mouth 3 (three) times a week.     . ramipril (ALTACE) 10 MG capsule Take 10 mg by mouth daily.    Marland Kitchen RELION PEN NEEDLES 32G X 4 MM MISC USE TWO TIMES A DAY AS ADVISED 100 each 0  . rivaroxaban (XARELTO) 20 MG TABS tablet Take 1 tablet (20 mg total) by mouth daily. 90 tablet 3   No current facility-administered medications on file prior to visit.    No Known Allergies Family History  Problem Relation Age of Onset  .  Heart disease Father   . Heart attack Father   . ALS Mother   . Diabetes Sister   . Hypertension Sister   . Heart failure Brother   . Stroke Neg Hx    PE: BP 110/60   Pulse 77   Ht 6\' 3"  (1.905 m)   Wt 237 lb (107.5 kg)   SpO2 97%   BMI 29.62 kg/m  Body mass index is 29.62 kg/m. Wt Readings from Last 3 Encounters:  11/24/18 237 lb (107.5 kg)  08/01/18 232 lb (105.2 kg)  06/12/18 230 lb 1.9 oz (104.4 kg)   Constitutional: overweight, in NAD Eyes: PERRLA, EOMI, no exophthalmos ENT: moist mucous membranes, no thyromegaly, no cervical lymphadenopathy Cardiovascular: RRR, No MRG Respiratory: CTA B Gastrointestinal: abdomen soft, NT, ND, BS+ Musculoskeletal: no deformities, strength intact in all 4 Skin: moist, warm, no rashes Neurological: no tremor with outstretched hands, DTR normal in all 4  ASSESSMENT: 1. DM2, insulin-dependent, uncontrolled, with complications - CAD - CKD stage 3  2. HL  3.  Overweight  PLAN:  1. Patient with longstanding, previously uncontrolled diabetes, now with improved control on a GLP-1 receptor agonist and premixed insulin regimen.  We tried small doses of metformin in the past but he had diarrhea with it and could not continue.  At last visit, sugars are slightly better with the exception of the times when he ate a late lunch or snack before dinner so pre-dinner and CBGs increased.  We discussed about reducing snacks.  We also increased the dose of his premixed insulin at that time.  We continue to Trulicity.  HbA1c at last visit was better, at 6.8%. -At this visit, per review of his log, sugars are higher, and he also gained weight.  He is less active as he cannot go to the gym.  He is trying to walk but is limited by the heat.  We discussed about different options for exercising at home, but in the meantime, since all sugars are higher, I advised him to increase his insulin.  We discussed that he needs to convert back to the previous doses when he  restarts exercising consistently. -He tells me that he is not snacking at night anymore. - I suggested to:  Patient Instructions  Please continue: - Trulicity 1.5 mg weekly  Please increase: - Insulin  70/30 to 32 units before breakfast and dinner When you start going to the gym again, may need to decrease the insulin back to 28 units 2x a day.  Please return in 4 months with your sugar log.   - we checked his HbA1c: 7.5% (higher) - advised to check sugars at different times of the day - 3x a day, rotating check times - advised for yearly eye exams >> he is UTD - return to clinic in 4 months        2. HL - Reviewed latest lipid panel from 04/2018: All fractions at goal Lab Results  Component Value Date   CHOL 131 03/28/2014   HDL 47 03/28/2014   LDLCALC 68 03/28/2014   TRIG 82 03/28/2014  - Continues Lipitor without side effects.  3.  Overweight -Continue Trulicity which should also help with weight loss -He gained 4 pounds before last visit and 5 since last visit  Richard Kingdom, MD PhD [facility] Endocrinology

## 2019-01-05 ENCOUNTER — Other Ambulatory Visit: Payer: Self-pay | Admitting: Internal Medicine

## 2019-01-19 ENCOUNTER — Other Ambulatory Visit: Payer: Self-pay

## 2019-01-19 MED ORDER — TRULICITY 1.5 MG/0.5ML ~~LOC~~ SOAJ: SUBCUTANEOUS | 3 refills | Status: DC

## 2019-01-24 ENCOUNTER — Other Ambulatory Visit: Payer: Self-pay

## 2019-01-24 MED ORDER — HUMULIN 70/30 KWIKPEN (70-30) 100 UNIT/ML ~~LOC~~ SUPN: PEN_INJECTOR | SUBCUTANEOUS | 3 refills | Status: DC

## 2019-01-24 NOTE — Telephone Encounter (Signed)
Last fill 08/01/18  #45ML/3 Last Ov 11/24/18

## 2019-03-02 ENCOUNTER — Other Ambulatory Visit: Payer: Self-pay | Admitting: Internal Medicine

## 2019-03-07 ENCOUNTER — Other Ambulatory Visit: Payer: Self-pay

## 2019-03-07 DIAGNOSIS — I714 Abdominal aortic aneurysm, without rupture, unspecified: Secondary | ICD-10-CM

## 2019-03-08 ENCOUNTER — Other Ambulatory Visit: Payer: Self-pay

## 2019-03-08 ENCOUNTER — Encounter: Payer: Self-pay | Admitting: Family

## 2019-03-08 ENCOUNTER — Ambulatory Visit (INDEPENDENT_AMBULATORY_CARE_PROVIDER_SITE_OTHER): Payer: Medicare Other | Admitting: Family

## 2019-03-08 ENCOUNTER — Ambulatory Visit (HOSPITAL_COMMUNITY)
Admission: RE | Admit: 2019-03-08 | Discharge: 2019-03-08 | Disposition: A | Discharge status: Home / Self Care | Payer: Medicare Other | Source: Ambulatory Visit | Attending: Family | Admitting: Family

## 2019-03-08 VITALS — BP 123/79 | HR 67 | Temp 97.8°F | Resp 20 | Ht 75.0 in | Wt 240.0 lb

## 2019-03-08 DIAGNOSIS — I714 Abdominal aortic aneurysm, without rupture, unspecified: Secondary | ICD-10-CM

## 2019-03-08 DIAGNOSIS — Z87891 Personal history of nicotine dependence: Secondary | ICD-10-CM

## 2019-03-08 NOTE — Patient Instructions (Signed)
Before your next abdominal ultrasound:  Avoid gas forming foods and beverages the day before the test.   Take two Extra-Strength Gas-X capsules at bedtime the night before the test. Take another two Extra-Strength Gas-X capsules in the middle of the night if you get up to the restroom, if not, first thing in the morning with water.  Do not chew gum.     Abdominal Aortic Aneurysm  An aneurysm is a bulge in one of the blood vessels that carry blood away from the heart (artery). It happens when blood pushes up against a weak or damaged place in the wall of an artery. An abdominal aortic aneurysm happens in the main artery of the body (aorta). Some aneurysms may not cause problems. If it grows, it can burst or tear, causing bleeding inside the body. This is an emergency. It needs to be treated right away. What are the causes? The exact cause of this condition is not known. What increases the risk? The following may make you more likely to get this condition:  Being a male who is 60 years of age or older.  Being white (Caucasian).  Using tobacco.  Having a family history of aneurysms.  Having the following conditions: ? Hardening of the arteries (arteriosclerosis). ? Inflammation of the walls of an artery (arteritis). ? Certain genetic conditions. ? Being very overweight (obesity). ? An infection in the wall of the aorta (infectious aortitis). ? High cholesterol. ? High blood pressure (hypertension). What are the signs or symptoms? Symptoms depend on the size of the aneurysm and how fast it is growing. Most grow slowly and do not cause any symptoms. If symptoms do occur, they may include:  Pain in the belly (abdomen), side, or back.  Feeling full after eating only small amounts of food.  Feeling a throbbing lump in the belly. Symptoms that the aneurysm has burst (ruptured) include:  Sudden, very bad pain in the belly, side, or back.  Feeling sick to your stomach (nauseous).   Throwing up (vomiting).  Feeling light-headed or passing out. How is this treated? Treatment for this condition depends on:  The size of the aneurysm.  How fast it is growing.  Your age.  Your risk of having it burst. If your aneurysm is smaller than 2 inches (5 cm), your doctor may manage it by:  Checking it often to see if it is getting bigger. You may have an imaging test (ultrasound) to check it every 3-6 months, every year, or every few years.  Giving you medicines to: ? Control blood pressure. ? Treat pain. ? Fight infection. If your aneurysm is larger than 2 inches (5 cm), you may need surgery to fix it. Follow these instructions at home: Lifestyle  Do not use any products that have nicotine or tobacco in them. This includes cigarettes, e-cigarettes, and chewing tobacco. If you need help quitting, ask your doctor.  Get regular exercise. Ask your doctor what types of exercise are best for you. Eating and drinking  Eat a heart-healthy diet. This includes eating plenty of: ? Fresh fruits and vegetables. ? Whole grains. ? Low-fat (lean) protein. ? Low-fat dairy products.  Avoid foods that are high in saturated fat and cholesterol. These foods include red meat and some dairy products.  Do not drink alcohol if: ? Your doctor tells you not to drink. ? You are pregnant, may be pregnant, or are planning to become pregnant.  If you drink alcohol: ? Limit how much you use   to:  0-1 drink a day for women.  0-2 drinks a day for men. ? Be aware of how much alcohol is in your drink. In the [place] one drink equals any of these:  One typical bottle of beer (12 oz).  One-half glass of wine (5 oz).  One shot of hard liquor (1 oz). General instructions  Take over-the-counter and prescription medicines only as told by your doctor.  Keep your blood pressure within normal limits. Ask your doctor what your blood pressure should be.  Have your blood sugar (glucose) level  and cholesterol levels checked regularly. Keep your blood sugar level and cholesterol levels within normal limits.  Avoid heavy lifting and activities that take a lot of effort. Ask your doctor what activities are safe for you.  Keep all follow-up visits as told by your doctor. This is important. ? Talk to your doctor about regular screenings to see if the aneurysm is getting bigger. Contact a doctor if you:  Have pain in your belly, side, or back.  Have a throbbing feeling in your belly.  Have a family history of aneurysms. Get help right away if you:  Have sudden, bad pain in your belly, side, or back.  Feel sick to your stomach.  Throw up.  Have trouble pooping (constipation).  Have trouble peeing (urinating).  Feel light-headed.  Have a fast heart rate when you stand.  Have sweaty skin that is cold to the touch (clammy).  Have shortness of breath.  Have a fever. These symptoms may be an emergency. Do not wait to see if the symptoms will go away. Get medical help right away. Call your local emergency services (911 in the [place] Do not drive yourself to the hospital. Summary  An aneurysm is a bulge in one of the blood vessels that carry blood away from the heart (artery). Some aneurysms may not cause problems.  You may need to have yours checked often. If it grows, it can burst or tear. This causes bleeding inside the body. It needs to be treated right away.  Follow instructions from your doctor about healthy lifestyle changes.  Keep all follow-up visits as told by your doctor. This is important. This information is not intended to replace advice given to you by your health care provider. Make sure you discuss any questions you have with your health care provider. Document Released: 08/21/2012 Document Revised: 08/14/2018 Document Reviewed: 12/03/2017 Elsevier Patient Education  2020 Elsevier Inc.  

## 2019-03-08 NOTE — Progress Notes (Addendum)
[facility] [place]   CC: Follow up Abdominal Aortic Aneurysm  History of Present Illness  [name] is a 73 y.o. ([birth year]) male whom Dr. Donnetta Hutching has been monitoring for a small AAA.  Pt underwent a CT scan for workup of hematuria. He had an incidental finding of a 3.6 cm infrarenal abdominal aortic aneurysm.  There is also some ectasia of the right common iliac artery.He had no known knowledge of this aneurysm. He has no symptoms of the aneurysm.  He does have a history of prior myocardial infarction 2001 and had a bare-metal stent placed that time.   The patient does not have acute back or abdominal pain. He has degenerative disc disease in his back, but this has improved with physical therapy and exercise.    The patient denies claudication type symptoms inhislegs with walking. Hedeniesanyhistory of stroke or TIA symptoms.   He had a colectomy in January 2019 for diverticulitis, colostomy was then revised; sees a gastroenterologist.   He takes Xarelto for atrial fib., Dr. Julianne Handler is his cardiologist.   Diabetic: Yes, review of records indicates his last A1C was7.5 (11-24-18), seeing an endocrinologist, Dr. Monna Fam Tobacco use: former smoker, quit 2001   Past Medical History:  Diagnosis Date  . AAA (abdominal aortic aneurysm) ([place])   . Allergy   . Atrial fibrillation [facility]) October 2013   Xarelto started; duration unknown  . Cataract   . Chronic kidney disease    Stage III   . Coronary artery disease    post bare-metal stent to LAD in 2001  . Diabetes mellitus    type 2   . Hypertension   . Myocardial infarct ([place]) 2001  . Obesity    Past Surgical History:  Procedure Laterality Date  . CARDIAC CATHETERIZATION  2006   stable with primarily nonobstructive disease  . CARDIOVERSION  03/27/2012   Procedure: CARDIOVERSION;  Surgeon: Carlena Bjornstad, MD;  Location: [facility] ENDOSCOPY;  Service: Cardiovascular;  Laterality: N/A;  .  COLOSTOMY TAKEDOWN N/A 03/08/2018   Procedure: LAPAROSCOPIC HARTMANNS REVERSAL;  Surgeon: Ileana Roup, MD;  Location: [facility] ORS;  Service: General;  Laterality: N/A;  . CORONARY ANGIOPLASTY WITH STENT PLACEMENT  2001   bare-metal stent to the LAD  . CYSTOSCOPY W/ URETERAL STENT PLACEMENT Bilateral 03/08/2018   Procedure: CYSTOSCOPY WITH RETROGRADE AND BILATERAL URETERAL STENT PLACEMENT;  Surgeon: Lucas Mallow, MD;  Location: [facility] ORS;  Service: Urology;  Laterality: Bilateral;  . CYSTOSCOPY WITH STENT PLACEMENT Bilateral 06/03/2017   Procedure: CYSTOSCOPY WITH STENT PLACEMENT;  Surgeon: Festus Aloe, MD;  Location: [facility] ORS;  Service: Urology;  Laterality: Bilateral;  . FLEXIBLE SIGMOIDOSCOPY N/A 03/08/2018   Procedure: FLEXIBLE SIGMOIDOSCOPY;  Surgeon: Ileana Roup, MD;  Location: [facility] ORS;  Service: General;  Laterality: N/A;  . HERNIA REPAIR  2006  . LAPAROSCOPIC PARASTOMAL HERNIA N/A 03/08/2018   Procedure: PARASTOMAL HERNIA REPAIR;  Surgeon: Ileana Roup, MD;  Location: [facility] ORS;  Service: General;  Laterality: N/A;  . LAPAROSCOPIC SIGMOID COLECTOMY N/A 06/03/2017   Procedure: LAPAROSCOPIC  SIGMOID COLECTOMY, COLOSTOMY;  Surgeon: Ileana Roup, MD;  Location: [facility] ORS;  Service: General;  Laterality: N/A;  . SKIN GRAFT  1981   Social History Social History   Socioeconomic History  . Marital status: Married    Spouse name: Not on file  . Number of children: Not on file  . Years of education: Not on file  . Highest education level: Not on  file  Occupational History  . Not on file  Social Needs  . Financial resource strain: Not on file  . Food insecurity    Worry: Not on file    Inability: Not on file  . Transportation needs    Medical: Not on file    Non-medical: Not on file  Tobacco Use  . Smoking status: Former Smoker    Packs/day: 0.50    Years: 40.00    Pack years: 20.00    Types: Cigarettes, Pipe, Cigars    Quit date: 05/11/1999    Years  since quitting: 19.8  . Smokeless tobacco: Never Used  Substance and Sexual Activity  . Alcohol use: No  . Drug use: No  . Sexual activity: Yes  Lifestyle  . Physical activity    Days per week: Not on file    Minutes per session: Not on file  . Stress: Not on file  Relationships  . Social Herbalist on phone: Not on file    Gets together: Not on file    Attends religious service: Not on file    Active member of club or organization: Not on file    Attends meetings of clubs or organizations: Not on file    Relationship status: Not on file  . Intimate partner violence    Fear of current or ex partner: Not on file    Emotionally abused: Not on file    Physically abused: Not on file    Forced sexual activity: Not on file  Other Topics Concern  . Not on file  Social History Narrative   Married    Retired: [employer]   3 grown children   Family History Family History  Problem Relation Age of Onset  . Heart disease Father   . Heart attack Father   . ALS Mother   . Diabetes Sister   . Hypertension Sister   . Heart failure Brother   . Stroke Neg Hx     Current Outpatient Medications on File Prior to Visit  Medication Sig Dispense Refill  . atorvastatin (LIPITOR) 20 MG tablet Take 20 mg by mouth at bedtime.     . Dulaglutide (TRULICITY) 1.5 0000000 SOPN INJECT SUBCUTANEOUSLY 1.5  MG WEEKLY 12 pen 3  . ferrous sulfate 325 (65 FE) MG tablet Take 325 mg by mouth 3 (three) times a week.    . Insulin Isophane & Regular Human (HUMULIN 70/30 KWIKPEN) (70-30) 100 UNIT/ML PEN INJECT SUBCUTANEOUSLY 25  UNITS BEFORE BREAKFAST AND  25 UNITS BEFORE DINNER 45 mL 3  . metoprolol tartrate (LOPRESSOR) 25 MG tablet Take 0.5 tablets (12.5 mg total) by mouth 2 (two) times daily. 90 tablet 2  . nitroGLYCERIN (NITROSTAT) 0.4 MG SL tablet Place 0.4 mg under the tongue every 5 (five) minutes as needed for chest pain.     Marland Kitchen omeprazole (PRILOSEC) 20 MG capsule Take 20 mg by mouth daily as needed  (for acid reflux/indigestion).     . Probiotic Product (PROBIOTIC-10 PO) Take 1 capsule by mouth 3 (three) times a week.     . ramipril (ALTACE) 10 MG capsule Take 10 mg by mouth daily.    Marland Kitchen RELION PEN NEEDLES 32G X 4 MM MISC USE 1  TWICE DAILY AS NEEDED 100 each 4  . rivaroxaban (XARELTO) 20 MG TABS tablet Take 1 tablet (20 mg total) by mouth daily. 90 tablet 3   No current facility-administered medications on file prior to visit.  No Known Allergies  ROS: See HPI for pertinent positives and negatives.  Physical Examination  Vitals:   03/08/19 0834  BP: 123/79  Pulse: 67  Resp: 20  Temp: 97.8 F (36.6 C)  SpO2: 98%  Weight: 240 lb (108.9 kg)  Height: 6\' 3"  (1.905 m)   Body mass index is 30 kg/m.  General: A&O x 3, WD, obese male. HEENT: Grossly intact and WNL.  Pulmonary: Sym exp, respirations are non labored, good air movt, CTAB, no rales, rhonchi, or wheezes Cardiac: Irregular rhythm with controlled rate, no detected murmur.  Carotid Bruits Right Left   Negative Negative   Adominal aortic pulse is not palpable Radial pulses are 2+ palpable                          VASCULAR EXAM:                                                                                                         LE Pulses Right Left       FEMORAL  2+ palpable  2+ palpable        POPLITEAL  2+ palpable   2+ palpable       POSTERIOR TIBIAL  1+ palpable   2+ palpable        DORSALIS PEDIS      ANTERIOR TIBIAL 2+ palpable  2+ palpable     Gastrointestinal: soft, NTND, -G/R, - HSM, - masses palpated, - CVAT B. Musculoskeletal: M/S 5/5 throughout, Extremities without ischemic changes. Skin: No rashes, no ulcers, no cellulitis.   Neurologic: CN 2-12 intact except haw significant hearing loss, Pain and light touch intact in extremities are intact, Motor exam as listed above. Psychiatric: Normal thought content, mood appropriate to clinical situation.    DATA  AAA Duplex  (03/08/2019): Abdominal Aorta Findings: +-----------+-------+----------+----------+--------+--------+--------+ Location   AP (cm)Trans (cm)PSV (cm/s)WaveformThrombusComments +-----------+-------+----------+----------+--------+--------+--------+ Proximal   2.32   2.62      69                                 +-----------+-------+----------+----------+--------+--------+--------+ Mid        3.73   4.28      62                                 +-----------+-------+----------+----------+--------+--------+--------+ Distal     2.26   2.42      38                                 +-----------+-------+----------+----------+--------+--------+--------+ RT CIA Prox2.0    2.1       108                                +-----------+-------+----------+----------+--------+--------+--------+ LT CIA Prox1.8  2.0       103                                +-----------+-------+----------+----------+--------+--------+--------+ Summary: Abdominal Aorta: The largest aortic measurement is 4.3 cm. The largest aortic diameter has increased compared to prior exam. Previous diameter measurement was 3.9 cm obtained on 09/12/17.   Bilateral Popliteal Artery Duplex (09/12/17): Right CFA: 1.6 cm Left CFA: 1.5 cm Right popliteal artery: 1.2 cm Left popliteal artery: 1.0 cm All triphasic waveforms No evidence of bilateral popliteal artery aneurysm  ABI (Date: 09/12/2017):  R:  ? ABI: 1.27 (no previous),  ? PT: tri ? DP: tri ? TBI:  0.88  L:  ? ABI: 1.28 (no previous),  ? PT: tri ? DP: tri ? TBI: 1.05 ? Normal bilateral ABI and TBI with all triphasic waveforms   Medical Decision Making  The patient is a 73 y.o. male who presents with asymptomatic AAA with a slight increase in size to 4.3 cm today from at 3.9 cm on 09-12-17.   Based on this patient's exam and diagnostic studies, the patient will follow up in 9 months  with the following studies: AAA duplex.  If  the AAA increases to 4.5 cm or greater, will obtain CTA abd/pelvis, and will also obtain CTA chest at that time to evaluate for ascending aortic aneurysm.   Consideration for repair of AAA would be made when the size is 5.5cm, growth > 1 cm/yr, and symptomatic status.  Abdominal aortic aneurysm less than 5-1/2 cm in diameter has less then 1/2% risk of rupture per year.        The patient was given information about AAA including signs, symptoms, treatment, and how to minimize the risk of enlargement and rupture of aneurysms.    I emphasized the importance of maximal medical management including strict control of blood pressure, blood glucose, and lipid levels, antiplatelet agents, obtaining regular exercise, and continued cessation of smoking.   The patient was advised to call 911 should the patient experience sudden onset abdominal or back pain.   Thank you for allowing [place] to participate in this patient's care.  Clemon Chambers, RN, MSN, FNP-C [facility] [place] Office: [facility] Physician: [email or web address]  03/08/2019, 8:39 AM

## 2019-03-17 ENCOUNTER — Other Ambulatory Visit: Payer: Self-pay | Admitting: Cardiovascular Disease

## 2019-03-26 ENCOUNTER — Other Ambulatory Visit: Payer: Self-pay

## 2019-03-28 ENCOUNTER — Ambulatory Visit: Payer: Medicare Other | Admitting: Internal Medicine

## 2019-03-28 ENCOUNTER — Encounter: Payer: Self-pay | Admitting: Internal Medicine

## 2019-03-28 VITALS — BP 110/72 | HR 85 | Ht 75.0 in | Wt 242.0 lb

## 2019-03-28 DIAGNOSIS — E785 Hyperlipidemia, unspecified: Secondary | ICD-10-CM

## 2019-03-28 DIAGNOSIS — IMO0002 Reserved for concepts with insufficient information to code with codable children: Secondary | ICD-10-CM

## 2019-03-28 DIAGNOSIS — E663 Overweight: Secondary | ICD-10-CM

## 2019-03-28 DIAGNOSIS — E1165 Type 2 diabetes mellitus with hyperglycemia: Secondary | ICD-10-CM | POA: Diagnosis not present

## 2019-03-28 DIAGNOSIS — E1151 Type 2 diabetes mellitus with diabetic peripheral angiopathy without gangrene: Secondary | ICD-10-CM

## 2019-03-28 LAB — POCT GLYCOSYLATED HEMOGLOBIN (HGB A1C): Hemoglobin A1C: 6.5 % — AB (ref 4.0–5.6)

## 2019-03-28 NOTE — Patient Instructions (Signed)
Please continue: - Trulicity 1.5 mg weekly - Insulin 70/30 32 units 2x a day before breakfast and dinner  Please let me know before you refill the Trulicity so we can increase the dose.  Please return in 4 months with your sugar log.

## 2019-03-28 NOTE — Progress Notes (Signed)
Patient ID: [name], male   DOB: [birth year], 73 y.o.   MRN: [id number]  HPI: [name] is a 73 y.o.-year-old male, returning for f/u for DM2, dx 1995, insulin-dependent, uncontrolled, with complications (CKD stage 3, CAD - AMI, s/p stent LAD 2001). Last visit 4 months ago.  He is here with his wife, who is also my patient, and who offers part of the history, especially related to his blood sugars, insulin doses, and diet.    At last visit sugars were higher as she was not going to the gym due to the coronavirus pandemic.  Reviewed HbA1c levels: Lab Results  Component Value Date   HGBA1C 7.5 (A) 11/24/2018   HGBA1C 6.8 (A) 08/01/2018   HGBA1C 7.4 (H) 03/02/2018   He is on: - Trulicity 1.5  mg weekly - Insulin 70/30 25 >> 28 >> 32 units before breakfast and dinner We tried low-dose metformin ER several times, but he could not tolerate it due to diarrhea >> now diarrhea is better He was on Tanzeum 50 mg weekly (started 09/2015) - >200$ per month! >> Trulicity 1.5 mg weekly He was previously on glipizide-metformin but could not tolerate it due to diarrhea -stopped 08/2017  Pt checks his sugars -3 times a day: - am: 121, 145-177, 186, 190 >> 109, 131-179 >> 117, 146-186 >> 106-183, 198, 218 - 2h after b'fast: 131 >> n/c >> 223 >> n/c  - before lunch: 198, 215 >> n/c - 2h after lunch: 179-228 >> n/c >> 191-228 >> 202, 237 >> n/c - before dinner:  83, 106-175, 202, 221 >> 107, 138-228 >> 111-203, 220, 241 - 2h after dinner:  n/c >> 176 >> 196 >> n/c - bedtime:76, 181 >> 212 >> 142-203 >> 132, 150 - nighttime:43-67, 156 >> 74-109 >> 90, 120 >> n/c Lowest sugar was 59 at bedtime >> .Marland Kitchen. 107 >> 106; he does not have hypoglycemia awareness! Highest sugar was 228 >> 237 >> 241.  Pt's meals are: - Breakfast: eggs, toast, ham/sausage or cereals + milk - Lunch: sandwich, vegetables, meat - Dinner: seafood, meat, vegetables - Snacks: nabs  Reviewed labs from PCP: 04/26/2018: CMP  normal with the exception of a slightly high glucose of 124, BUN/creatinine 19/1.5, GFR 46 and slightly higher chloride, of 107. Lipids: 147/58/47/88 CBC with slightly low red blood cells of 4.08 (4.14-5.8), normal hemoglobin of 13.3 and slightly high MCV of 99 (79-97) PSA 0.8  04/13/2017: - Lipids: 124/65/39/72 - CMP normal, glucose 98, BUN/creatinine 10/1.3, GFR 55, potassium 3.4 (3.5-5.2), Ca 8.1 (8.6-10.2) - CBC with differential: low Hb 11.3 (13-17.7), low RBC 3.57 (4.14-5.8) - ferritin 167 - PSA 0.5  09/30/2015: - Hep C virus antibody <0.1 - Lipids: 126/68/44/68 - CMP normal, except glucose 209, BUN/creatinine 22/1.39, GFR 51, potassium 5.4 (3.5-5.2), CO2 17 (18-29) - CBC with differential normal - B12 vitamin 305 [id number])  04/01/2015: - ACR 56.3 - CBC with differential normal - Lipids: 116/56/42/63 - CMP: Glucose 193, BUN/creatinine 21/1.23, GFR 60, LFTs normal - PSA 0.5    -+ CKD stage III: Lab Results  Component Value Date   BUN 19 03/12/2018   CREATININE 1.38 (H) 03/12/2018  On ramipril. -He has hyperlipidemia.  On Lipitor.  - last eye exam was in 04/2018: No DR -No numbness and tingling in his feet.   He has a history of diverticulitis (sees Dr. Jazzy Parmer Gong).  He had a colectomy in 05/2017 and had to have a colostomy bag.  He was feeling better  after his colectomy.  He had hernia repair surgery and reversal of his ostomy 03/08/2018.  ROS: Constitutional: + weight gain/no weight loss, no fatigue, no subjective hyperthermia, no subjective hypothermia Eyes: no blurry vision, no xerophthalmia ENT: no sore throat, no nodules palpated in neck, no dysphagia, no odynophagia, no hoarseness Cardiovascular: no CP/no SOB/no palpitations/no leg swelling Respiratory: no cough/no SOB/no wheezing Gastrointestinal: no N/no V/no D/no C/no acid reflux Musculoskeletal: no muscle aches/no joint aches Skin: no rashes, no hair loss Neurological: no tremors/no numbness/no tingling/no  dizziness  I reviewed pt's medications, allergies, PMH, social hx, family hx, and changes were documented in the history of present illness. Otherwise, unchanged from my initial visit note.  Past Medical History:  Diagnosis Date  . AAA (abdominal aortic aneurysm) ([place])   . Allergy   . Atrial fibrillation [facility]) October 2013   Xarelto started; duration unknown  . Cataract   . Chronic kidney disease    Stage III   . Coronary artery disease    post bare-metal stent to LAD in 2001  . Diabetes mellitus    type 2   . Hypertension   . Myocardial infarct ([place]) 2001  . Obesity    Past Surgical History:  Procedure Laterality Date  . CARDIAC CATHETERIZATION  2006   stable with primarily nonobstructive disease  . CARDIOVERSION  03/27/2012   Procedure: CARDIOVERSION;  Surgeon: Carlena Bjornstad, MD;  Location: [facility] ENDOSCOPY;  Service: Cardiovascular;  Laterality: N/A;  . COLOSTOMY TAKEDOWN N/A 03/08/2018   Procedure: LAPAROSCOPIC HARTMANNS REVERSAL;  Surgeon: Ileana Roup, MD;  Location: [facility] ORS;  Service: General;  Laterality: N/A;  . CORONARY ANGIOPLASTY WITH STENT PLACEMENT  2001   bare-metal stent to the LAD  . CYSTOSCOPY W/ URETERAL STENT PLACEMENT Bilateral 03/08/2018   Procedure: CYSTOSCOPY WITH RETROGRADE AND BILATERAL URETERAL STENT PLACEMENT;  Surgeon: Lucas Mallow, MD;  Location: [facility] ORS;  Service: Urology;  Laterality: Bilateral;  . CYSTOSCOPY WITH STENT PLACEMENT Bilateral 06/03/2017   Procedure: CYSTOSCOPY WITH STENT PLACEMENT;  Surgeon: Festus Aloe, MD;  Location: [facility] ORS;  Service: Urology;  Laterality: Bilateral;  . FLEXIBLE SIGMOIDOSCOPY N/A 03/08/2018   Procedure: FLEXIBLE SIGMOIDOSCOPY;  Surgeon: Ileana Roup, MD;  Location: [facility] ORS;  Service: General;  Laterality: N/A;  . HERNIA REPAIR  2006  . LAPAROSCOPIC PARASTOMAL HERNIA N/A 03/08/2018   Procedure: PARASTOMAL HERNIA REPAIR;  Surgeon: Ileana Roup, MD;  Location: [facility] ORS;  Service: General;   Laterality: N/A;  . LAPAROSCOPIC SIGMOID COLECTOMY N/A 06/03/2017   Procedure: LAPAROSCOPIC  SIGMOID COLECTOMY, COLOSTOMY;  Surgeon: Ileana Roup, MD;  Location: [facility] ORS;  Service: General;  Laterality: N/A;  . SKIN GRAFT  1981   Social History Main Topics  . Smoking status: Former Smoker -- 0.50 packs/day for 40 years    Types: Cigarettes, Pipe, Cigars    Quit date: 05/11/1999  . Smokeless tobacco: Never Used  . Alcohol Use: No  . Drug Use: No   Social History Narrative   Married    Retired: [employer]   3 grown children   Current Outpatient Medications on File Prior to Visit  Medication Sig Dispense Refill  . atorvastatin (LIPITOR) 20 MG tablet Take 20 mg by mouth at bedtime.     . Dulaglutide (TRULICITY) 1.5 0000000 SOPN INJECT SUBCUTANEOUSLY 1.5  MG WEEKLY 12 pen 3  . ferrous sulfate 325 (65 FE) MG tablet Take 325 mg by mouth 3 (three) times a week.    . Insulin  Isophane & Regular Human (HUMULIN 70/30 KWIKPEN) (70-30) 100 UNIT/ML PEN INJECT SUBCUTANEOUSLY 25  UNITS BEFORE BREAKFAST AND  25 UNITS BEFORE DINNER 45 mL 3  . metoprolol tartrate (LOPRESSOR) 25 MG tablet TAKE ONE-HALF TABLET BY  MOUTH TWO TIMES DAILY 90 tablet 3  . nitroGLYCERIN (NITROSTAT) 0.4 MG SL tablet Place 0.4 mg under the tongue every 5 (five) minutes as needed for chest pain.     Marland Kitchen omeprazole (PRILOSEC) 20 MG capsule Take 20 mg by mouth daily as needed (for acid reflux/indigestion).     . Probiotic Product (PROBIOTIC-10 PO) Take 1 capsule by mouth 3 (three) times a week.     . ramipril (ALTACE) 10 MG capsule Take 10 mg by mouth daily.    Marland Kitchen RELION PEN NEEDLES 32G X 4 MM MISC USE 1  TWICE DAILY AS NEEDED 100 each 4  . rivaroxaban (XARELTO) 20 MG TABS tablet Take 1 tablet (20 mg total) by mouth daily. 90 tablet 3   No current facility-administered medications on file prior to visit.    No Known Allergies Family History  Problem Relation Age of Onset  . Heart disease Father   . Heart attack Father   . ALS  Mother   . Diabetes Sister   . Hypertension Sister   . Heart failure Brother   . Stroke Neg Hx    PE: BP 110/72   Pulse 85   Ht 6\' 3"  (1.905 m)   Wt 242 lb (109.8 kg)   SpO2 99%   BMI 30.25 kg/m  Body mass index is 30.25 kg/m. Wt Readings from Last 3 Encounters:  03/28/19 242 lb (109.8 kg)  03/08/19 240 lb (108.9 kg)  11/24/18 237 lb (107.5 kg)   Constitutional: overweight, in NAD Eyes: PERRLA, EOMI, no exophthalmos ENT: moist mucous membranes, no thyromegaly, no cervical lymphadenopathy Cardiovascular: RRR, No MRG Respiratory: CTA B Gastrointestinal: abdomen soft, NT, ND, BS+ Musculoskeletal: no deformities, strength intact in all 4 Skin: moist, warm, no rashes Neurological: no tremor with outstretched hands, DTR normal in all 4  ASSESSMENT: 1. DM2, insulin-dependent, uncontrolled, with complications - CAD - CKD stage 3  2. HL  3.  Overweight  PLAN:  1. Patient with longstanding, previously uncontrolled diabetes, with improved control after starting the GLP-1 receptor agonist.  He also continues his premixed insulin regimen, of which we will increase the dose at last visit.  We have tried small doses of Metformin in the past but had diarrhea and could not continue.  At last visit, HbA1c was higher, at 7.5%.  At that time, he was planning to restart going to the gym, which he stopped during the coronavirus pandemic.  We discussed that he may need to decrease the premixed insulin doses when he restarted to go to the gym. -At this visit, his sugars are slightly improved from before, but he continues to have spikes in his blood sugars, most likely due to snacking.  We discussed about the importance of avoiding snacks.  I will also want to increase his Trulicity dose, however, he just got a refill so we will continue the current dose for now.  I advised him to get in touch with me before he needs to refill it next so we can increase to 3 mg weekly.  In the meantime, we will need  to decrease his dose of insulin. - I suggested to:  Patient Instructions  Please continue: - Trulicity 1.5 mg weekly - Insulin 70/30 32 units 2x a day  before breakfast and dinner  Please return in 4 months with your sugar log.   - we checked his HbA1c: 6.5% (better) - advised to check sugars at different times of the day - 2-3x a day, rotating check times - advised for yearly eye exams >> he is UTD - return to clinic in 4 months       2. HL -Reviewed latest lipid panel from 04/2018: All fractions at goal -Continues Lipitor without side effects  3.  Overweight -Continue Trulicity which should also help with weight loss, will plan to increase the dose before next visit -Gained 5 pounds since last visit  Philemon Kingdom, MD PhD [facility] Endocr[facility]ology

## 2019-04-24 ENCOUNTER — Other Ambulatory Visit: Payer: Self-pay | Admitting: Cardiovascular Disease

## 2019-04-25 ENCOUNTER — Other Ambulatory Visit: Payer: Self-pay | Admitting: Cardiovascular Disease

## 2019-04-25 NOTE — Telephone Encounter (Signed)
New Message     *STAT* If patient is at the pharmacy, call can be transferred to refill team.   1. Which medications need to be refilled? (please list name of each medication and dose if known) XARELTO 20 MG TABS tablet  2. Which pharmacy/location (including street and city if local pharmacy) is medication to be sent to? [id number] - [place], [place]  3. Do they need a 30 day or 90 day supply? 28  Pt is needing the medication today, pts mail order has not yet arrived

## 2019-04-25 NOTE — Telephone Encounter (Signed)
Xarelto 20mg  QD already sent to [facility] this am at 8:26am, receipt confirmed by pharmacy.  Escribed per their request. Called spoke with pt's wife, aware rx sent to pharmacy.

## 2019-04-25 NOTE — Telephone Encounter (Signed)
Pt last saw Dr Angelena Form 06/12/18, last labs 04/26/18 Creat 1.5, age 73, weight 109.8kg, CrCl 68.12, based on CrCl pt is on appropriate dosage of Xarelto 20mg  QD.  Will refill rx.

## 2019-05-02 ENCOUNTER — Encounter: Payer: Self-pay | Admitting: Internal Medicine

## 2019-05-02 NOTE — Progress Notes (Unsigned)
Received labs from PCP, drawn on 04/30/2019: Lipids: 146/55/49/85 CMP normal, except BUN/creatinine 18/1.6, GFR 42.  His glucose was 84. PSA 0.6

## 2019-05-17 ENCOUNTER — Telehealth: Payer: Self-pay

## 2019-05-17 NOTE — Telephone Encounter (Signed)
Patient called in stating that Dr told her to call in when her and husband were finished taking Dulaglutide (TRULICITY) 1.5 MG/0.5ML SOPN.  To call in so she could send in new dose and Rx for patients    [facility] - [place], [place] - [address]   Please call and advise  

## 2019-05-18 MED ORDER — TRULICITY 3 MG/0.5ML ~~LOC~~ SOAJ: 3.0000 mg | SUBCUTANEOUS | 1 refills | Status: DC

## 2019-05-18 NOTE — Telephone Encounter (Signed)
RX sent

## 2019-05-21 LAB — HM DIABETES EYE EXAM

## 2019-05-22 ENCOUNTER — Telehealth: Payer: Self-pay | Admitting: Internal Medicine

## 2019-05-22 NOTE — Telephone Encounter (Signed)
We can continue without Trulicity for now until in stock, unless we have samples to give him.

## 2019-05-22 NOTE — Telephone Encounter (Signed)
[name] [name] requests to be called at ph# [phone] re: [facility] told patient that Trulicity is out of  [employer]. Patient has been put on a waiting list to receive the medication. Patient has 1 Pen left. [facility] told patient to reach out  to Dr. Cruzita Lederer for advice on what to do.

## 2019-05-24 ENCOUNTER — Encounter: Payer: Self-pay | Admitting: Internal Medicine

## 2019-06-11 ENCOUNTER — Emergency Department (HOSPITAL_COMMUNITY): Payer: Medicare Other

## 2019-06-11 ENCOUNTER — Encounter (HOSPITAL_COMMUNITY): Payer: Self-pay | Admitting: Emergency Medicine

## 2019-06-11 ENCOUNTER — Inpatient Hospital Stay (HOSPITAL_COMMUNITY)
Admission: EM | Admit: 2019-06-11 | Discharge: 2019-06-16 | DRG: 177 | Disposition: A | Discharge status: Home Health | Payer: Medicare Other | Attending: Internal Medicine | Admitting: Internal Medicine

## 2019-06-11 ENCOUNTER — Other Ambulatory Visit: Payer: Self-pay

## 2019-06-11 DIAGNOSIS — N183 Chronic kidney disease, stage 3 unspecified: Secondary | ICD-10-CM | POA: Diagnosis present

## 2019-06-11 DIAGNOSIS — H9190 Unspecified hearing loss, unspecified ear: Secondary | ICD-10-CM | POA: Diagnosis present

## 2019-06-11 DIAGNOSIS — I4891 Unspecified atrial fibrillation: Secondary | ICD-10-CM | POA: Diagnosis present

## 2019-06-11 DIAGNOSIS — IMO0002 Reserved for concepts with insufficient information to code with codable children: Secondary | ICD-10-CM | POA: Diagnosis present

## 2019-06-11 DIAGNOSIS — N179 Acute kidney failure, unspecified: Secondary | ICD-10-CM | POA: Diagnosis present

## 2019-06-11 DIAGNOSIS — Z87891 Personal history of nicotine dependence: Secondary | ICD-10-CM

## 2019-06-11 DIAGNOSIS — Z9049 Acquired absence of other specified parts of digestive tract: Secondary | ICD-10-CM | POA: Diagnosis not present

## 2019-06-11 DIAGNOSIS — I714 Abdominal aortic aneurysm, without rupture: Secondary | ICD-10-CM | POA: Diagnosis present

## 2019-06-11 DIAGNOSIS — I5022 Chronic systolic (congestive) heart failure: Secondary | ICD-10-CM | POA: Diagnosis present

## 2019-06-11 DIAGNOSIS — Z7901 Long term (current) use of anticoagulants: Secondary | ICD-10-CM | POA: Diagnosis not present

## 2019-06-11 DIAGNOSIS — E1151 Type 2 diabetes mellitus with diabetic peripheral angiopathy without gangrene: Secondary | ICD-10-CM

## 2019-06-11 DIAGNOSIS — E785 Hyperlipidemia, unspecified: Secondary | ICD-10-CM | POA: Diagnosis present

## 2019-06-11 DIAGNOSIS — J1282 Pneumonia due to coronavirus disease 2019: Secondary | ICD-10-CM | POA: Diagnosis present

## 2019-06-11 DIAGNOSIS — Z6830 Body mass index (BMI) 30.0-30.9, adult: Secondary | ICD-10-CM | POA: Diagnosis not present

## 2019-06-11 DIAGNOSIS — U071 COVID-19: Principal | ICD-10-CM | POA: Diagnosis present

## 2019-06-11 DIAGNOSIS — Z95828 Presence of other vascular implants and grafts: Secondary | ICD-10-CM

## 2019-06-11 DIAGNOSIS — I10 Essential (primary) hypertension: Secondary | ICD-10-CM | POA: Diagnosis present

## 2019-06-11 DIAGNOSIS — E1122 Type 2 diabetes mellitus with diabetic chronic kidney disease: Secondary | ICD-10-CM | POA: Diagnosis present

## 2019-06-11 DIAGNOSIS — E669 Obesity, unspecified: Secondary | ICD-10-CM | POA: Diagnosis present

## 2019-06-11 DIAGNOSIS — I252 Old myocardial infarction: Secondary | ICD-10-CM | POA: Diagnosis not present

## 2019-06-11 DIAGNOSIS — I480 Paroxysmal atrial fibrillation: Secondary | ICD-10-CM | POA: Diagnosis not present

## 2019-06-11 DIAGNOSIS — I4821 Permanent atrial fibrillation: Secondary | ICD-10-CM | POA: Diagnosis not present

## 2019-06-11 DIAGNOSIS — Z833 Family history of diabetes mellitus: Secondary | ICD-10-CM | POA: Diagnosis not present

## 2019-06-11 DIAGNOSIS — R0602 Shortness of breath: Secondary | ICD-10-CM | POA: Diagnosis present

## 2019-06-11 DIAGNOSIS — I2510 Atherosclerotic heart disease of native coronary artery without angina pectoris: Secondary | ICD-10-CM | POA: Diagnosis present

## 2019-06-11 DIAGNOSIS — Z8249 Family history of ischemic heart disease and other diseases of the circulatory system: Secondary | ICD-10-CM

## 2019-06-11 DIAGNOSIS — J9601 Acute respiratory failure with hypoxia: Secondary | ICD-10-CM | POA: Diagnosis present

## 2019-06-11 DIAGNOSIS — Z794 Long term (current) use of insulin: Secondary | ICD-10-CM

## 2019-06-11 DIAGNOSIS — E1165 Type 2 diabetes mellitus with hyperglycemia: Secondary | ICD-10-CM | POA: Diagnosis present

## 2019-06-11 DIAGNOSIS — E1159 Type 2 diabetes mellitus with other circulatory complications: Secondary | ICD-10-CM | POA: Diagnosis present

## 2019-06-11 DIAGNOSIS — I130 Hypertensive heart and chronic kidney disease with heart failure and stage 1 through stage 4 chronic kidney disease, or unspecified chronic kidney disease: Secondary | ICD-10-CM | POA: Diagnosis present

## 2019-06-11 DIAGNOSIS — E7800 Pure hypercholesterolemia, unspecified: Secondary | ICD-10-CM | POA: Diagnosis not present

## 2019-06-11 DIAGNOSIS — Z79899 Other long term (current) drug therapy: Secondary | ICD-10-CM

## 2019-06-11 DIAGNOSIS — Z85038 Personal history of other malignant neoplasm of large intestine: Secondary | ICD-10-CM | POA: Diagnosis not present

## 2019-06-11 LAB — PROTIME-INR
INR: 1.2 (ref 0.8–1.2)
Prothrombin Time: 14.6 seconds (ref 11.4–15.2)

## 2019-06-11 LAB — TRIGLYCERIDES: Triglycerides: 87 mg/dL (ref <150)

## 2019-06-11 LAB — COMPREHENSIVE METABOLIC PANEL
ALT: 33 U/L (ref 0–44)
AST: 51 U/L — ABNORMAL HIGH (ref 15–41)
Albumin: 3.2 g/dL — ABNORMAL LOW (ref 3.5–5.0)
Alkaline Phosphatase: 40 U/L (ref 38–126)
Anion gap: 11 (ref 5–15)
BUN: 27 mg/dL — ABNORMAL HIGH (ref 8–23)
CO2: 20 mmol/L — ABNORMAL LOW (ref 22–32)
Calcium: 8.3 mg/dL — ABNORMAL LOW (ref 8.9–10.3)
Chloride: 99 mmol/L (ref 98–111)
Creatinine, Ser: 1.68 mg/dL — ABNORMAL HIGH (ref 0.61–1.24)
GFR calc Af Amer: 46 mL/min — ABNORMAL LOW (ref >60)
GFR calc non Af Amer: 40 mL/min — ABNORMAL LOW (ref >60)
Glucose, Bld: 247 mg/dL — ABNORMAL HIGH (ref 70–99)
Potassium: 4.1 mmol/L (ref 3.5–5.1)
Sodium: 130 mmol/L — ABNORMAL LOW (ref 135–145)
Total Bilirubin: 0.7 mg/dL (ref 0.3–1.2)
Total Protein: 7 g/dL (ref 6.5–8.1)

## 2019-06-11 LAB — CBC WITH DIFFERENTIAL/PLATELET
Abs Immature Granulocytes: 0.02 10*3/uL (ref 0.00–0.07)
Basophils Absolute: 0 10*3/uL (ref 0.0–0.1)
Basophils Relative: 0 %
Eosinophils Absolute: 0 10*3/uL (ref 0.0–0.5)
Eosinophils Relative: 0 %
HCT: 37.1 % — ABNORMAL LOW (ref 39.0–52.0)
Hemoglobin: 12.6 g/dL — ABNORMAL LOW (ref 13.0–17.0)
Immature Granulocytes: 1 %
Lymphocytes Relative: 9 %
Lymphs Abs: 0.4 10*3/uL — ABNORMAL LOW (ref 0.7–4.0)
MCH: 33.2 pg (ref 26.0–34.0)
MCHC: 34 g/dL (ref 30.0–36.0)
MCV: 97.6 fL (ref 80.0–100.0)
Monocytes Absolute: 0.2 10*3/uL (ref 0.1–1.0)
Monocytes Relative: 4 %
Neutro Abs: 3.5 10*3/uL (ref 1.7–7.7)
Neutrophils Relative %: 86 %
Platelets: UNDETERMINED 10*3/uL (ref 150–400)
RBC: 3.8 MIL/uL — ABNORMAL LOW (ref 4.22–5.81)
RDW: 13.2 % (ref 11.5–15.5)
WBC: 4.1 10*3/uL (ref 4.0–10.5)
nRBC: 0 % (ref 0.0–0.2)

## 2019-06-11 LAB — RESPIRATORY PANEL BY RT PCR (FLU A&B, COVID)
Influenza A by PCR: NEGATIVE
Influenza B by PCR: NEGATIVE
SARS Coronavirus 2 by RT PCR: POSITIVE — AB

## 2019-06-11 LAB — URINALYSIS, ROUTINE W REFLEX MICROSCOPIC
Bilirubin Urine: NEGATIVE
Glucose, UA: >=500 mg/dL — AB
Ketones, ur: 5 mg/dL — AB
Leukocytes,Ua: NEGATIVE
Nitrite: NEGATIVE
Protein, ur: 100 mg/dL — AB
Specific Gravity, Urine: 1.027 (ref 1.005–1.030)
pH: 5 (ref 5.0–8.0)

## 2019-06-11 LAB — FIBRINOGEN: Fibrinogen: 677 mg/dL — ABNORMAL HIGH (ref 210–475)

## 2019-06-11 LAB — PROCALCITONIN: Procalcitonin: 0.16 ng/mL

## 2019-06-11 LAB — C-REACTIVE PROTEIN: CRP: 10 mg/dL — ABNORMAL HIGH (ref <1.0)

## 2019-06-11 LAB — FERRITIN: Ferritin: 945 ng/mL — ABNORMAL HIGH (ref 24–336)

## 2019-06-11 LAB — LACTATE DEHYDROGENASE: LDH: 255 U/L — ABNORMAL HIGH (ref 98–192)

## 2019-06-11 LAB — LACTIC ACID, PLASMA: Lactic Acid, Venous: 1.7 mmol/L (ref 0.5–1.9)

## 2019-06-11 LAB — D-DIMER, QUANTITATIVE: D-Dimer, Quant: 2.55 ug/mL-FEU — ABNORMAL HIGH (ref 0.00–0.50)

## 2019-06-11 MED ORDER — DILTIAZEM HCL-DEXTROSE 125-5 MG/125ML-% IV SOLN (PREMIX)
5.0000 mg/h | INTRAVENOUS | Status: DC
  Administered 2019-06-12: 01:00:00 5 mg/h via INTRAVENOUS
  Filled 2019-06-11: qty 125

## 2019-06-11 MED ORDER — ACETAMINOPHEN 325 MG PO TABS
650.0000 mg | ORAL_TABLET | Freq: Once | ORAL | Status: AC
  Administered 2019-06-12: 650 mg via ORAL
  Filled 2019-06-11: qty 2

## 2019-06-11 MED ORDER — DEXAMETHASONE SODIUM PHOSPHATE 10 MG/ML IJ SOLN
6.0000 mg | Freq: Once | INTRAMUSCULAR | Status: AC
  Administered 2019-06-11: 21:00:00 6 mg via INTRAVENOUS
  Filled 2019-06-11: qty 1

## 2019-06-11 MED ORDER — SODIUM CHLORIDE 0.9 % IV SOLN
200.0000 mg | Freq: Once | INTRAVENOUS | Status: AC
  Administered 2019-06-12: 200 mg via INTRAVENOUS
  Filled 2019-06-11: qty 200

## 2019-06-11 MED ORDER — SODIUM CHLORIDE 0.9 % IV SOLN
100.0000 mg | Freq: Every day | INTRAVENOUS | Status: AC
  Administered 2019-06-13 – 2019-06-16 (×4): 100 mg via INTRAVENOUS
  Filled 2019-06-11 (×4): qty 20

## 2019-06-11 NOTE — H&P (Signed)
[name] [id number] DOB: 12/27/1945 DOA: 06/11/2019     PCP: Cyndi Bender, PA-C   Outpatient Specialists:   CARDS:   Dr. Lauree Chandler    Patient arrived to ER on 06/11/19 at 1934  Patient coming from: home Live  With family    Chief Complaint:  Chief Complaint  Patient presents with  . Shortness of Breath    HPI: [name] is a 74 y.o. male with medical history significant of  DM2 insulin-dependent CKD stage III CAD, hypertension, atrial fibrillation on Xarelto, obesity, Colon Ca sp Colectomy and now sp colostomy take down    Presented with   worsening shortness of breath was recently tested for Covid and awaiting results.  At baseline not on oxygen.  At primary care provider was found to have satting 9085% on room air increased generalized weakness and shortness of breath for past 5 days Has been progressively getting worse Reports light headedness and feeling of malaise. Reported outpatient positive coronavirus test today.   Infectious risk factors:  Reports  shortness of breath, dry cough, chest pain, Sore throat, URI symptoms, anosmia/change in taste, N/V/Diarrhea/abdominal pain,  Body aches, severe fatigue Reports known sick contacts, known COVID 19 exposure, inability to completely isolate   KNOWN COVID POSITIVE   In  ER  COVID TEST    POSITIVE,     Lab Results  Component Value Date   [place] (A) 06/11/2019     Regarding pertinent Chronic problems:    Hyperlipidemia -  on statins Lipitor   HTN on Lopressor ramipril   chronic CHF systolic - last echo 99991111 EF 50-55%    CAD  - On Aspirin, statin, betablocker                  -   followed by cardiology                - last cardiac cath 2001   DM 2 -  Lab Results  Component Value Date   HGBA1C 6.5 (A) 03/28/2019   on insulin,    obesity-   BMI Readings from Last 1 Encounters:  06/11/19 30.00 kg/m       A. Fib -  - CHA2DS2 vas score 4 :  current  on  anticoagulation with Xarelto,         -  Rate control:  Currently controlled with  Metoprolol,    CKD stage III  - baseline Cr 1.4 Lab Results  Component Value Date   CREATININE 1.68 (H) 06/11/2019   CREATININE 1.38 (H) 03/12/2018   CREATININE 1.40 (H) 03/11/2018       While in ER: Inflammatory markers noted to be elevated Covid initially pending    The following Work up has been ordered so far:  Orders Placed This Encounter  Procedures  . Culture, blood (Routine x 2)  . Respiratory Panel by RT PCR (Flu A&B, Covid) - Nasopharyngeal Swab  . DG Chest Port 1 View  . Comprehensive metabolic panel  . Lactic acid, plasma  . CBC with Differential  . Protime-INR  . Urinalysis, Routine w reflex microscopic  . D-dimer, quantitative  . Procalcitonin  . Lactate dehydrogenase  . Ferritin  . Triglycerides  . Fibrinogen  . C-reactive protein  . Diet NPO time specified  . Notify Physician if pt is possible Sepsis patient  . Document height and weight  . Insert / maintain saline lock  . Check Rectal Temperature  .  Cardiac monitoring  . Insert peripheral IV x 2  . Initiate Carrier Fluid Protocol  . Place surgical mask on patient  . Patient to wear surgical mask during transportation  . Assess patient for ability to self-prone. If able (can move self in bed, ambulate) and stable (SpO2 and oxygen requirement):  . RN/NT - Document specific oxygen requirements in CHL  . Notify EDP if new oxygen requirements escalates > 4L per minute [place]  . RN to draw the following extra tubes:  Marland Kitchen In and Out Cath  . Consult to hospitalist  ALL PATIENTS BEING ADMITTED/HAVING PROCEDURES NEED COVID-19 SCREENING  . Airborne and Contact precautions  . Pulse oximetry, continuous  . EKG 12-Lead  . ED EKG 12-Lead     Following Medications were ordered in ER: Medications  diltiazem (CARDIZEM) 125 mg in dextrose 5% 125 mL (1 mg/mL) infusion (has no administration in time range)  dexamethasone (DECADRON)  injection 6 mg (6 mg Intravenous Given 06/11/19 2044)        Consult Orders  (From admission, onward)         Start     Ordered   06/11/19 2152  Consult to hospitalist  ALL PATIENTS BEING ADMITTED/HAVING PROCEDURES NEED COVID-19 SCREENING  Once    Comments: ALL PATIENTS BEING ADMITTED/HAVING PROCEDURES NEED COVID-19 SCREENING  Provider:  (Not yet assigned)  Question Answer Comment  Place call to: [facility]   Reason for Consult Admit      06/11/19 2151           Significant initial  Findings: Abnormal Labs Reviewed  COMPREHENSIVE METABOLIC PANEL - Abnormal; Notable for the following components:      Result Value   Sodium 130 (*)    CO2 20 (*)    Glucose, Bld 247 (*)    BUN 27 (*)    Creatinine, Ser 1.68 (*)    Calcium 8.3 (*)    Albumin 3.2 (*)    AST 51 (*)    GFR calc non Af Amer 40 (*)    GFR calc Af Amer 46 (*)    All other components within normal limits  URINALYSIS, ROUTINE W REFLEX MICROSCOPIC - Abnormal; Notable for the following components:   Glucose, UA >=500 (*)    Hgb urine dipstick MODERATE (*)    Ketones, ur 5 (*)    Protein, ur 100 (*)    Bacteria, UA RARE (*)    All other components within normal limits  D-DIMER, QUANTITATIVE (NOT AT [facility]) - Abnormal; Notable for the following components:   D-Dimer, Quant 2.55 (*)    All other components within normal limits  LACTATE DEHYDROGENASE - Abnormal; Notable for the following components:   LDH 255 (*)    All other components within normal limits  FERRITIN - Abnormal; Notable for the following components:   Ferritin 945 (*)    All other components within normal limits  FIBRINOGEN - Abnormal; Notable for the following components:   Fibrinogen 677 (*)    All other components within normal limits  C-REACTIVE PROTEIN - Abnormal; Notable for the following components:   CRP 10.0 (*)    All other components within normal limits     Otherwise labs showing:    Recent Labs  Lab 06/11/19 2050  NA  130*  K 4.1  CO2 20*  GLUCOSE 247*  BUN 27*  CREATININE 1.68*  CALCIUM 8.3*    Cr  Up from baseline see below Lab Results  Component Value  Date   CREATININE 1.68 (H) 06/11/2019   CREATININE 1.38 (H) 03/12/2018   CREATININE 1.40 (H) 03/11/2018    Recent Labs  Lab 06/11/19 2050  AST 51*  ALT 33  ALKPHOS 40  BILITOT 0.7  PROT 7.0  ALBUMIN 3.2*   Lab Results  Component Value Date   CALCIUM 8.3 (L) 06/11/2019   PHOS 3.9 06/07/2017   WBC      Component Value Date/Time   WBC 7.1 03/12/2018 0421   ANC    Component Value Date/Time   NEUTROABS 3.9 03/02/2018 1151   ALC No components found for: LYMPHAB    Plt: Lab Results  Component Value Date   PLT 151 03/12/2018    Lactic Acid, Venous    Component Value Date/Time   LATICACIDVEN 1.7 06/11/2019 2050    Procalcitonin 0.16 COVID-19 Labs  Recent Labs    06/11/19 2050  DDIMER 2.55*  FERRITIN 945*  LDH 255*  CRP 10.0*    No results found for: SARSCOV2NAA    HG/HCT  Stable,     Component Value Date/Time   HGB 11.3 (L) 03/12/2018 0421   HCT 35.4 (L) 03/12/2018 0421    No results for input(s): LIPASE, AMYLASE in the last 168 hours. No results for input(s): AMMONIA in the last 168 hours.  No components found for: LABALBU   Troponin  ordered     ECG: Ordered Personally reviewed by me showing: HR : 138 Rhythm:   A.fib. W RVR    no evidence of ischemic changes QTC 439   DM  labs:  HbA1C: Recent Labs    08/01/18 1139 11/24/18 1045 03/28/19 1057  HGBA1C 6.8* 7.5* 6.5*     CBG (last 3)  No results for input(s): GLUCAP in the last 72 hours.     UA HEMATURIA   Urine analysis:    Component Value Date/Time   COLORURINE YELLOW 06/11/2019 2050   APPEARANCEUR CLEAR 06/11/2019 2050   LABSPEC 1.027 06/11/2019 2050   PHURINE 5.0 06/11/2019 2050   GLUCOSEU >=500 (A) 06/11/2019 2050   HGBUR MODERATE (A) 06/11/2019 2050   BILIRUBINUR NEGATIVE 06/11/2019 2050   KETONESUR 5 (A) 06/11/2019  2050   PROTEINUR 100 (A) 06/11/2019 2050   NITRITE NEGATIVE 06/11/2019 2050   LEUKOCYTESUR NEGATIVE 06/11/2019 2050      Ordered   CXR -diffuse bilateral infiltrates    ED Triage Vitals  Enc Vitals Group     BP 06/11/19 1938 (!) 131/100     Pulse Rate 06/11/19 1938 (!) 124     Resp 06/11/19 1938 (!) 26     Temp 06/11/19 1938 99.4 F (37.4 C)     Temp Source 06/11/19 1938 Oral     SpO2 06/11/19 1938 (!) 85 %     Weight 06/11/19 1940 240 lb (108.9 kg)     Height 06/11/19 1940 6\' 3"  (1.905 m)     Head Circumference --      Peak Flow --      Pain Score 06/11/19 1939 0     Pain Loc --      Pain Edu? --      Excl. in [place]? --   TMAX(24)@       Latest  Blood pressure 127/85, pulse (!) 117, temperature (!) 102.6 F (39.2 C), temperature source Rectal, resp. rate (!) 26, height 6\' 3"  (1.905 m), weight 108.9 kg, SpO2 94 %.    Hospitalist was called for admission for likely Covid infection   Review  of Systems:    Pertinent positives include: chills, fatigue  shortness of breath at rest.   dyspnea on exertion,  Constitutional:  No weight loss, night sweats, Fevers, , weight loss  HEENT:  No headaches, Difficulty swallowing,Tooth/dental problems,Sore throat,  No sneezing, itching, ear ache, nasal congestion, post nasal drip,  Cardio-vascular:  No chest pain, Orthopnea, PND, anasarca, dizziness, palpitations.no Bilateral lower extremity swelling  GI:  No heartburn, indigestion, abdominal pain, nausea, vomiting, diarrhea, change in bowel habits, loss of appetite, melena, blood in stool, hematemesis Resp:   No excess mucus, no productive cough, No non-productive cough, No coughing up of blood.No change in color of mucus.No wheezing. Skin:  no rash or lesions. No jaundice GU:  no dysuria, change in color of urine, no urgency or frequency. No straining to urinate.  No flank pain.  Musculoskeletal:  No joint pain or no joint swelling. No decreased range of motion. No back pain.   Psych:  No change in mood or affect. No depression or anxiety. No memory loss.  Neuro: no localizing neurological complaints, no tingling, no weakness, no double vision, no gait abnormality, no slurred speech, no confusion  All systems reviewed and apart from [place] all are negative  Past Medical History:   Past Medical History:  Diagnosis Date  . AAA (abdominal aortic aneurysm) ([place])   . Allergy   . Atrial fibrillation [facility]) October 2013   Xarelto started; duration unknown  . Cataract   . Chronic kidney disease    Stage III   . Coronary artery disease    post bare-metal stent to LAD in 2001  . Diabetes mellitus    type 2   . Hypertension   . Myocardial infarct ([place]) 2001  . Obesity       Past Surgical History:  Procedure Laterality Date  . CARDIAC CATHETERIZATION  2006   stable with primarily nonobstructive disease  . CARDIOVERSION  03/27/2012   Procedure: CARDIOVERSION;  Surgeon: Carlena Bjornstad, MD;  Location[facility] ENDOSCOPY;  Service: Cardiovascular;  Laterality: N/A;  . COLOSTOMY TAKEDOWN N/A 03/08/2018   Procedure: LAPAROSCOPIC HARTMANNS REVERSAL;  Surgeon: Ileana Roup, MD;  Location: [facility] ORS;  Service: General;  Laterality: N/A;  . CORONARY ANGIOPLASTY WITH STENT PLACEMENT  2001   bare-metal stent to the LAD  . CYSTOSCOPY W/ URETERAL STENT PLACEMENT Bilateral 03/08/2018   Procedure: CYSTOSCOPY WITH RETROGRADE AND BILATERAL URETERAL STENT PLACEMENT;  Surgeon: Lucas Mallow, MD;  Location: [facility] ORS;  Service: Urology;  Laterality: Bilateral;  . CYSTOSCOPY WITH STENT PLACEMENT Bilateral 06/03/2017   Procedure: CYSTOSCOPY WITH STENT PLACEMENT;  Surgeon: Festus Aloe, MD;  Location: [facility] ORS;  Service: Urology;  Laterality: Bilateral;  . FLEXIBLE SIGMOIDOSCOPY N/A 03/08/2018   Procedure: FLEXIBLE SIGMOIDOSCOPY;  Surgeon: Ileana Roup, MD;  Location: [facility] ORS;  Service: General;  Laterality: N/A;  . HERNIA REPAIR  2006  . LAPAROSCOPIC PARASTOMAL HERNIA N/A  03/08/2018   Procedure: PARASTOMAL HERNIA REPAIR;  Surgeon: Ileana Roup, MD;  Location: [facility] ORS;  Service: General;  Laterality: N/A;  . LAPAROSCOPIC SIGMOID COLECTOMY N/A 06/03/2017   Procedure: LAPAROSCOPIC  SIGMOID COLECTOMY, COLOSTOMY;  Surgeon: Ileana Roup, MD;  Location: [facility] ORS;  Service: General;  Laterality: N/A;  . SKIN GRAFT  1981    Social History:  Ambulatory   independently cane, walker  wheelchair bound, bed bound     reports that he quit smoking about 20 years ago. His smoking use included cigarettes, pipe, and cigars.  He has a 20.00 pack-year smoking history. He has never used smokeless tobacco. He reports that he does not drink alcohol or use drugs.   Family History:   Family History  Problem Relation Age of Onset  . Heart disease Father   . Heart attack Father   . ALS Mother   . Diabetes Sister   . Hypertension Sister   . Heart failure Brother   . Stroke Neg Hx     Allergies: No Known Allergies   Prior to Admission medications   Medication Sig Start Date End Date Taking? Authorizing Provider  acetaminophen (TYLENOL) 500 MG tablet Take 1,000 mg by mouth every 6 (six) hours as needed for mild pain, fever or headache.   Yes [provider]  atorvastatin (LIPITOR) 20 MG tablet Take 20 mg by mouth at bedtime.  03/26/14  Yes [provider]  Dulaglutide (TRULICITY) 3 0000000 SOPN Inject 3 mg into the skin once a week. 05/18/19  Yes Philemon Kingdom, MD  Insulin Isophane & Regular Human (HUMULIN 70/30 KWIKPEN) (70-30) 100 UNIT/ML PEN INJECT SUBCUTANEOUSLY 25  UNITS BEFORE BREAKFAST AND  25 UNITS BEFORE DINNER Patient taking differently: Inject 25 Units into the skin See admin instructions. INJECT SUBCUTANEOUSLY 25  UNITS BEFORE BREAKFAST AND  25 UNITS BEFORE DINNER 03/02/19  Yes Philemon Kingdom, MD  metoprolol tartrate (LOPRESSOR) 25 MG tablet TAKE ONE-HALF TABLET BY  MOUTH TWO TIMES DAILY Patient taking differently: Take 12.5 mg  by mouth 2 (two) times daily.  03/20/19  Yes Burnell Blanks, MD  nitroGLYCERIN (NITROSTAT) 0.4 MG SL tablet Place 0.4 mg under the tongue every 5 (five) minutes as needed for chest pain.    Yes [provider]  omeprazole (PRILOSEC) 20 MG capsule Take 20 mg by mouth daily as needed (for acid reflux/indigestion).    Yes [provider]  ramipril (ALTACE) 10 MG capsule Take 10 mg by mouth daily.   Yes [provider]  XARELTO 20 MG TABS tablet TAKE 1 TABLET BY MOUTH EVERY DAY Patient taking differently: Take 20 mg by mouth daily with supper.  04/25/19  Yes Burnell Blanks, MD  RELION PEN NEEDLES 32G X 4 MM MISC USE 1  TWICE DAILY AS NEEDED 01/05/19   Philemon Kingdom, MD   Physical Exam: Blood pressure 127/85, pulse (!) 117, temperature (!) 102.6 F (39.2 C), temperature source Rectal, resp. rate (!) 26, height 6\' 3"  (1.905 m), weight 108.9 kg, SpO2 94 %. 1. General:  in   Acute distress increased work of breathing  -appearing 2. Psychological: Alert and  Oriented 3. Head/ENT:    Dry Mucous Membranes                          Head Non traumatic, neck supple                          Poor Dentition 4. SKIN:   decreased Skin turgor,  Skin clean Dry and intact no rash 5. Heart: Regular rate and rhythm no  Murmur, no Rub or gallop 6. Lungs:  no wheezes or crackles   7. Abdomen: Soft,  non-tender, Non distended  obese  bowel sounds present 8. Lower extremities: no clubbing, cyanosis, no  edema 9. Neurologically Grossly intact, moving all 4 extremities equally   10. MSK: Normal range of motion   All other LABS:    No results for input(s): WBC, NEUTROABS, HGB,  HCT, MCV, PLT in the last 168 hours.   Recent Labs  Lab 06/11/19 2050  NA 130*  K 4.1  CL 99  CO2 20*  GLUCOSE 247*  BUN 27*  CREATININE 1.68*  CALCIUM 8.3*     Recent Labs  Lab 06/11/19 2050  AST 51*  ALT 33  ALKPHOS 40  BILITOT 0.7  PROT 7.0  ALBUMIN 3.2*       Cultures:  No results found for: SDES, SPECREQUEST, CULT, REPTSTATUS   Radiological Exams on Admission: DG Chest Port 1 View  Result Date: 06/11/2019 CLINICAL DATA:  Increasing shortness of breath EXAM: PORTABLE CHEST 1 VIEW COMPARISON:  06/04/2017 FINDINGS: Cardiac shadow is mildly prominent but accentuated by the portable technique. Aortic calcifications are noted. Patchy opacities are noted within the lungs bilaterally worse on the left than the right. Elevation of the right hemidiaphragm is again seen. No bony abnormality is noted. IMPRESSION: Patchy opacities consistent with multifocal pneumonia. Correlation with the pending COVID-19 testing is recommended. Electronically Signed   By: Inez Catalina M.D.   On: 06/11/2019 20:15    Chart has been reviewed    Assessment/Plan   74 y.o. male with medical history significant of  DM2 insulin-dependent CKD stage III CAD, hypertension, atrial fibrillation on Xarelto, obesity, Colon Ca sp Colectomy and now sp colostomy take down    Admitted for Covid  Pneumonia and acute respiratory failure hypoxia  Present on Admission: . Suspected COVID-19 virus infection -  FROM HOME  WITH KNOWN HX OF COVID19 tested today at [facility] Novel Corona Virus testing:  Ordered 06/11/19 and is positive   Following concerning LAB/ imaging findings:  CBC: leukopenia, lymphopenia   ANC/ALC ratio>3.5 BMP: increased BUN/Cr   LFTs: increased AST/ALT/Tbili   CRP, LDH: increased   IL-6 and Ferritin increased   Procalcitonin: low   CXR: hazy bilateral peripheral opacities or otherwise abnormal      -Following work-up initiated:      sputum culture Ordered 06/11/19, Blood cultures  Ordered 06/11/19,     Following complications noted:    evidence of AKI - will provide gentle rehydration   elevated LFT's likely in the setting of COVID continue to follow   acute respiratory failure with hypoxia - continue oxygen treatment  Plan of treatment: - Transfer to [facility] to Dr. Myna Hidalgo   -given severity of illness initiate steroids Decadron 6mg  q 24 hours And pharmacy consult for remdesivir   - Will follow daily d.dimer - Assess for ability to prone  - Supportive management -Fluid sparing resuscitation  -Provide oxygen as needed currently on   SpO2: 93 % O2 Flow Rate (L/min): 2 L/min    Poor Prognostic factors  74 y.o.  Personal hx of DM2, CAD,  HTN, obesity  Evidence of  organ damage  Present  AKI, respiratory failure requiring >4L [place]  tachypnea, tachycardia present on admission  ABS neutrophil to lymphocyte ratio >3.5     Will order Airborne and Contact precautions     . Uncontrolled type 2 diabetes mellitus with circulatory disorder -  - Order Sensitive   SSI   -   home insulin regimen   switch to   Levemir 20 units,  -  check TSH and HgA1C  - Hold by mouth medications    . Hyperlipidemia -chronic stable continue home medications  . HYPERTENSION, BENIGN -hold ACE inhibitor given AKI  . CAD, NATIVE VESSEL -chronic stable continue home medications  .  A-fib  w rvr (HCC)-continue diltiazem drip gently rehydrate restart home medications if able to tolerate Continue Xarelto  Other plan as per orders.  DVT prophylaxis:  Xarelto  Code Status: presumed to be FULL CODE   Unable to discuss with pt in detail, please re address in AM  Family Communication:   Family not at  Bedside   Disposition Plan:     To home once workup is complete and patient is stable                                       Consults called: none   Admission status:  ED Disposition    ED Disposition Condition Comment   Admit  The patient appears reasonably stabilized for admission considering the current resources, flow, and capabilities available in the ED at this time, and I doubt any other [facility] requiring further screening and/or treatment in the ED prior to admission is  present.         inpatient     Expect 2 midnight stay secondary to severity of  patient's current illness including   hemodynamic instability despite optimal treatment (tachycardia  Tachypnea hypoxia )   Severe lab/radiological/exam abnormalities including:   Suspicious for Covid pneumonia  and extensive comorbidities including:  DM2    CHF  CAD  CKD  history of   malignancy, .  Chronic anticoagulation  That are currently affecting medical management.   I expect  patient to be hospitalized for 2 midnights requiring inpatient medical care.  Patient is at high risk for adverse outcome (such as loss of life or disability) if not treated.  Indication for inpatient stay as follows:  Severe change from baseline regarding mental status Hemodynamic instability despite maximal medical therapy,  ongoing suicidal ideations,  severe pain requiring acute inpatient management,  inability to maintain oral hydration   persistent chest pain despite medical management Need for operative/procedural  intervention New or worsening hypoxia  Need for IV antibiotics, IV fluids, IV rate controling medications, IV antihypertensives, IV pain medications, IV anticoagulation     Level of care     SDU tele indefinitely please discontinue once patient no longer qualifies   Precautions: admitted as  covid positive Airborne and Contact precautions    PPE: Used by the provider:   P100  eye Goggles,  Gloves  gown   Jaquasia Doscher 06/11/2019, 1:05 AM    [facility]     after 2 AM please page floor coverage PA If 7AM-7PM, please contact the day team taking care of the patient using [email or web address]   Patient was evaluated in the context of the global COVID-19 pandemic, which necessitated consideration that the patient might be at risk for infection with the SARS-CoV-2 virus that causes COVID-19. Institutional protocols and algorithms that pertain to the evaluation of patients at risk for COVID-19 are in a state of rapid change based on information released by regulatory  bodies including the CDC and federal and state organizations. These policies and algorithms were followed during the patient's care.

## 2019-06-11 NOTE — ED Notes (Addendum)
Patient oxygen was low at pcp. Patient wife states patient cant move really good. Patient was having weakness and has been sick since last Wednesday. Patient has been getting worse. Patient was given a 1000mg  of tylenol at 1100 am today. [phone] Maud Deed.

## 2019-06-11 NOTE — ED Provider Notes (Signed)
[name] DEPT Provider Note   CSN: Richard:[id number] Arrival date & time: 06/11/19  1934     History Chief Complaint  Patient presents with  . Shortness of Breath    [name] is a 74 y.o. male.  The history is provided by the patient and medical records. No language interpreter was used.  Shortness of Breath  [name] is a 74 y.o. male who presents to the Emergency Department complaining of short of breath. He presents the emergency department complaining of shortness of breath, hypoxia and dizziness. He began feeling poorly last Wednesday with cough, dizziness and malaise. He was seen in his PCPs office and was told he needed oxygen. He also had an outpatient positive coronavirus test today. He denies any fevers. He does have some cough and shortness of breath. He also complains of generalized malaise. Symptoms are severe and constant nature.    Past Medical History:  Diagnosis Date  . AAA (abdominal aortic aneurysm) ([place])   . Allergy   . Atrial fibrillation [facility]) October 2013   Xarelto started; duration unknown  . Cataract   . Chronic kidney disease    Stage III   . Coronary artery disease    post bare-metal stent to LAD in 2001  . Diabetes mellitus    type 2   . Hypertension   . Myocardial infarct ([place]) 2001  . Obesity     Patient Active Problem List   Diagnosis Date Noted  . Suspected COVID-19 virus infection 06/11/2019  . Overweight (BMI 25.0-29.9) 08/01/2018  . S/P colostomy takedown 03/08/2018  . Diverticular stricture s/p colectomy/colostomy 06/03/2017 06/15/2017  . Colostomy in place [facility]) 06/15/2017  . Bleeding from colostomy ([place]) 06/14/2017  . GIB (gastrointestinal bleeding) 06/14/2017  . Chronic anticoagulation 06/14/2017  . Acute blood loss anemia 06/14/2017  . A-fib ([place]) 03/28/2017  . Diverticulitis 03/28/2017  . History of hearing problem 03/28/2017  . H/O cataract 03/28/2017  . Uncontrolled type 2 diabetes  mellitus with circulatory disorder 04/15/2014  . Aneurysm of abdominal vessel ([place]) 08/03/2011  . Hyperlipidemia 07/06/2008  . HYPERTENSION, BENIGN 07/06/2008  . CAD, NATIVE VESSEL 07/06/2008    Past Surgical History:  Procedure Laterality Date  . CARDIAC CATHETERIZATION  2006   stable with primarily nonobstructive disease  . CARDIOVERSION  03/27/2012   Procedure: CARDIOVERSION;  Surgeon: Carlena Bjornstad, MD;  Location: [facility] ENDOSCOPY;  Service: Cardiovascular;  Laterality: N/A;  . COLOSTOMY TAKEDOWN N/A 03/08/2018   Procedure: LAPAROSCOPIC HARTMANNS REVERSAL;  Surgeon: Ileana Roup, MD;  Location: [facility] ORS;  Service: General;  Laterality: N/A;  . CORONARY ANGIOPLASTY WITH STENT PLACEMENT  2001   bare-metal stent to the LAD  . CYSTOSCOPY W/ URETERAL STENT PLACEMENT Bilateral 03/08/2018   Procedure: CYSTOSCOPY WITH RETROGRADE AND BILATERAL URETERAL STENT PLACEMENT;  Surgeon: Lucas Mallow, MD;  Location: [facility] ORS;  Service: Urology;  Laterality: Bilateral;  . CYSTOSCOPY WITH STENT PLACEMENT Bilateral 06/03/2017   Procedure: CYSTOSCOPY WITH STENT PLACEMENT;  Surgeon: Festus Aloe, MD;  Location: [facility] ORS;  Service: Urology;  Laterality: Bilateral;  . FLEXIBLE SIGMOIDOSCOPY N/A 03/08/2018   Procedure: FLEXIBLE SIGMOIDOSCOPY;  Surgeon: Ileana Roup, MD;  Location: [facility] ORS;  Service: General;  Laterality: N/A;  . HERNIA REPAIR  2006  . LAPAROSCOPIC PARASTOMAL HERNIA N/A 03/08/2018   Procedure: PARASTOMAL HERNIA REPAIR;  Surgeon: Ileana Roup, MD;  Location: [facility] ORS;  Service: General;  Laterality: N/A;  . LAPAROSCOPIC SIGMOID COLECTOMY N/A 06/03/2017  Procedure: LAPAROSCOPIC  SIGMOID COLECTOMY, COLOSTOMY;  Surgeon: Ileana Roup, MD;  Location: [facility] ORS;  Service: General;  Laterality: N/A;  . SKIN GRAFT  1981       Family History  Problem Relation Age of Onset  . Heart disease Father   . Heart attack Father   . ALS Mother   . Diabetes Sister   .  Hypertension Sister   . Heart failure Brother   . Stroke Neg Hx     Social History   Tobacco Use  . Smoking status: Former Smoker    Packs/day: 0.50    Years: 40.00    Pack years: 20.00    Types: Cigarettes, Pipe, Cigars    Quit date: 05/11/1999    Years since quitting: 20.0  . Smokeless tobacco: Never Used  Substance Use Topics  . Alcohol use: No  . Drug use: No    Home Medications Prior to Admission medications   Medication Sig Start Date End Date Taking? Authorizing Provider  acetaminophen (TYLENOL) 500 MG tablet Take 1,000 mg by mouth every 6 (six) hours as needed for mild pain, fever or headache.   Yes [provider]  atorvastatin (LIPITOR) 20 MG tablet Take 20 mg by mouth at bedtime.  03/26/14  Yes [provider]  Dulaglutide (TRULICITY) 3 0000000 SOPN Inject 3 mg into the skin once a week. 05/18/19  Yes Philemon Kingdom, MD  Insulin Isophane & Regular Human (HUMULIN 70/30 KWIKPEN) (70-30) 100 UNIT/ML PEN INJECT SUBCUTANEOUSLY 25  UNITS BEFORE BREAKFAST AND  25 UNITS BEFORE DINNER Patient taking differently: Inject 25 Units into the skin See admin instructions. INJECT SUBCUTANEOUSLY 25  UNITS BEFORE BREAKFAST AND  25 UNITS BEFORE DINNER 03/02/19  Yes Philemon Kingdom, MD  metoprolol tartrate (LOPRESSOR) 25 MG tablet TAKE ONE-HALF TABLET BY  MOUTH TWO TIMES DAILY Patient taking differently: Take 12.5 mg by mouth 2 (two) times daily.  03/20/19  Yes Burnell Blanks, MD  nitroGLYCERIN (NITROSTAT) 0.4 MG SL tablet Place 0.4 mg under the tongue every 5 (five) minutes as needed for chest pain.    Yes [provider]  omeprazole (PRILOSEC) 20 MG capsule Take 20 mg by mouth daily as needed (for acid reflux/indigestion).    Yes [provider]  ramipril (ALTACE) 10 MG capsule Take 10 mg by mouth daily.   Yes [provider]  XARELTO 20 MG TABS tablet TAKE 1 TABLET BY MOUTH EVERY DAY Patient taking differently: Take 20 mg by mouth  daily with supper.  04/25/19  Yes Burnell Blanks, MD  RELION PEN NEEDLES 32G X 4 MM MISC USE 1  TWICE DAILY AS NEEDED 01/05/19   Philemon Kingdom, MD    Allergies    Patient has no known allergies.  Review of Systems   Review of Systems  Respiratory: Positive for shortness of breath.   All other systems reviewed and are negative.   Physical Exam Updated Vital Signs BP 127/90   Pulse (!) 103   Temp (!) 102.6 F (39.2 C) (Rectal)   Resp (!) 31   Ht 6\' 3"  (1.905 m)   Wt 108.9 kg   SpO2 93%   BMI 30.00 kg/m   Physical Exam Vitals and nursing note reviewed.  Constitutional:      Appearance: He is well-developed.  HENT:     Head: Normocephalic and atraumatic.  Cardiovascular:     Rate and Rhythm: Tachycardia present. Rhythm irregular.     Heart sounds: No murmur.  Pulmonary:     Effort: Pulmonary effort is normal. No respiratory distress.     Breath sounds: Normal breath sounds.     Comments: tachypnea Abdominal:     Palpations: Abdomen is soft.     Tenderness: There is no abdominal tenderness. There is no guarding or rebound.  Musculoskeletal:        General: No swelling or tenderness.  Skin:    General: Skin is warm and dry.  Neurological:     Mental Status: He is alert and oriented to person, place, and time.     Comments: Very hard of hearing.  Psychiatric:        Behavior: Behavior normal.     ED Results / Procedures / Treatments   Labs (all labs ordered are listed, but only abnormal results are displayed) Labs Reviewed  COMPREHENSIVE METABOLIC PANEL - Abnormal; Notable for the following components:      Result Value   Sodium 130 (*)    CO2 20 (*)    Glucose, Bld 247 (*)    BUN 27 (*)    Creatinine, Ser 1.68 (*)    Calcium 8.3 (*)    Albumin 3.2 (*)    AST 51 (*)    GFR calc non Af Amer 40 (*)    GFR calc Af Amer 46 (*)    All other components within normal limits  CBC WITH DIFFERENTIAL/PLATELET - Abnormal; Notable for the following  components:   RBC 3.80 (*)    Hemoglobin 12.6 (*)    HCT 37.1 (*)    Lymphs Abs 0.4 (*)    All other components within normal limits  URINALYSIS, ROUTINE W REFLEX MICROSCOPIC - Abnormal; Notable for the following components:   Glucose, UA >=500 (*)    Hgb urine dipstick MODERATE (*)    Ketones, ur 5 (*)    Protein, ur 100 (*)    Bacteria, UA RARE (*)    All other components within normal limits  D-DIMER, QUANTITATIVE (NOT AT [facility]) - Abnormal; Notable for the following components:   D-Dimer, Quant 2.55 (*)    All other components within normal limits  LACTATE DEHYDROGENASE - Abnormal; Notable for the following components:   LDH 255 (*)    All other components within normal limits  FERRITIN - Abnormal; Notable for the following components:   Ferritin 945 (*)    All other components within normal limits  FIBRINOGEN - Abnormal; Notable for the following components:   Fibrinogen 677 (*)    All other components within normal limits  C-REACTIVE PROTEIN - Abnormal; Notable for the following components:   CRP 10.0 (*)    All other components within normal limits  CULTURE, BLOOD (ROUTINE X 2)  CULTURE, BLOOD (ROUTINE X 2)  RESPIRATORY PANEL BY RT PCR (FLU A&B, COVID)  LACTIC ACID, PLASMA  PROTIME-INR  PROCALCITONIN  TRIGLYCERIDES  TROPONIN I (HIGH SENSITIVITY)    EKG EKG Interpretation  Date/Time:  Monday June 11 2019 20:04:27 EST Ventricular Rate:  138 PR Interval:    QRS Duration: 101 QT Interval:  307 QTC Calculation: 466 R Axis:   -7 Text Interpretation: Atrial fibrillation Low voltage, precordial leads Abnormal R-wave progression, early transition Consider anterior infarct ST depression, probably rate related Confirmed by Quintella Reichert [phone]) on 06/11/2019 8:30:18 PM   Radiology DG Chest Port 1 View  Result Date: 06/11/2019 CLINICAL DATA:  Increasing shortness of breath EXAM: PORTABLE CHEST 1 VIEW COMPARISON:  06/04/2017 FINDINGS: Cardiac shadow is mildly  prominent but accentuated by the portable technique. Aortic calcifications are noted. Patchy opacities are noted within the lungs bilaterally worse on the left than the right. Elevation of the right hemidiaphragm is again seen. No bony abnormality is noted. IMPRESSION: Patchy opacities consistent with multifocal pneumonia. Correlation with the pending COVID-19 testing is recommended. Electronically Signed   By: Inez Catalina M.D.   On: 06/11/2019 20:15    Procedures Procedures (including critical care time) CRITICAL CARE Performed by: Quintella Reichert   Total critical care time: 45 minutes  Critical care time was exclusive of separately billable procedures and treating other patients.  Critical care was necessary to treat or prevent imminent or life-threatening deterioration.  Critical care was time spent personally by me on the following activities: development of treatment plan with patient and/or surrogate as well as nursing, discussions with consultants, evaluation of patient's response to treatment, examination of patient, obtaining history from patient or surrogate, ordering and performing treatments and interventions, ordering and review of laboratory studies, ordering and review of radiographic studies, pulse oximetry and re-evaluation of patient's condition.  Medications Ordered in ED Medications  diltiazem (CARDIZEM) 125 mg in dextrose 5% 125 mL (1 mg/mL) infusion (has no administration in time range)  acetaminophen (TYLENOL) tablet 650 mg (has no administration in time range)  remdesivir 200 mg in sodium chloride 0.9% 250 mL IVPB (has no administration in time range)    Followed by  remdesivir 100 mg in sodium chloride 0.9 % 100 mL IVPB (has no administration in time range)  dexamethasone (DECADRON) injection 6 mg (6 mg Intravenous Given 06/11/19 2044)    ED Course  I have reviewed the triage vital signs and the nursing notes.  Pertinent labs & imaging results that were available  during my care of the patient were reviewed by me and considered in my medical decision making (see chart for details).    MDM Rules/Calculators/A&P                     Patient here for evaluation of shortness of breath. He did have an outpatient COVID test that was positive. He is ill appearing on evaluation with tachypnea, a fib with RVR. Chest x-ray with multiple infiltrates consistent with COVID-19 infection. He was treated with diltiazem for rate control, Decadron for presumed COVID pneumonia. Attempted to contact patient's wife to update her but there was no answer on call. Hospitalist consulted for admission for further evaluation and treatment.  Final Clinical Impression(s) / ED Diagnoses Final diagnoses:  Pneumonia due to COVID-19 virus  Rapid atrial fibrillation [facility])    Rx / DC Orders ED Discharge Orders    None       Quintella Reichert, MD 06/12/19 0004

## 2019-06-11 NOTE — ED Notes (Signed)
Pt assisted up to the [facility] in room to have a BM. Steady gait.

## 2019-06-11 NOTE — ED Triage Notes (Addendum)
Pt reports increasing shortness of breath. Was tested at [place] today for [place] and awaiting results. Pt denies wearing oxygen at home and O2 saturation of 85% on room air. Pt family reports increasing weakness and shortness of breath since Wed.

## 2019-06-12 ENCOUNTER — Ambulatory Visit: Payer: Medicare Other | Admitting: Physician Assistant

## 2019-06-12 DIAGNOSIS — J9601 Acute respiratory failure with hypoxia: Secondary | ICD-10-CM

## 2019-06-12 DIAGNOSIS — N179 Acute kidney failure, unspecified: Secondary | ICD-10-CM

## 2019-06-12 LAB — CBC WITH DIFFERENTIAL/PLATELET
Abs Immature Granulocytes: 0.01 10*3/uL (ref 0.00–0.07)
Basophils Absolute: 0 10*3/uL (ref 0.0–0.1)
Basophils Relative: 1 %
Eosinophils Absolute: 0 10*3/uL (ref 0.0–0.5)
Eosinophils Relative: 0 %
HCT: 34.8 % — ABNORMAL LOW (ref 39.0–52.0)
Hemoglobin: 12.1 g/dL — ABNORMAL LOW (ref 13.0–17.0)
Immature Granulocytes: 1 %
Lymphocytes Relative: 19 %
Lymphs Abs: 0.4 10*3/uL — ABNORMAL LOW (ref 0.7–4.0)
MCH: 33.8 pg (ref 26.0–34.0)
MCHC: 34.8 g/dL (ref 30.0–36.0)
MCV: 97.2 fL (ref 80.0–100.0)
Monocytes Absolute: 0.1 10*3/uL (ref 0.1–1.0)
Monocytes Relative: 6 %
Neutro Abs: 1.6 10*3/uL — ABNORMAL LOW (ref 1.7–7.7)
Neutrophils Relative %: 73 %
Platelets: ADEQUATE 10*3/uL (ref 150–400)
RBC: 3.58 MIL/uL — ABNORMAL LOW (ref 4.22–5.81)
RDW: 13.2 % (ref 11.5–15.5)
WBC: 2.1 10*3/uL — ABNORMAL LOW (ref 4.0–10.5)
nRBC: 0 % (ref 0.0–0.2)

## 2019-06-12 LAB — GLUCOSE, CAPILLARY
Glucose-Capillary: 247 mg/dL — ABNORMAL HIGH (ref 70–99)
Glucose-Capillary: 211 mg/dL — ABNORMAL HIGH (ref 70–99)
Glucose-Capillary: 284 mg/dL — ABNORMAL HIGH (ref 70–99)
Glucose-Capillary: 357 mg/dL — ABNORMAL HIGH (ref 70–99)
Glucose-Capillary: 477 mg/dL — ABNORMAL HIGH (ref 70–99)

## 2019-06-12 LAB — EXPECTORATED SPUTUM ASSESSMENT W GRAM STAIN, RFLX TO RESP C

## 2019-06-12 LAB — HEMOGLOBIN A1C
Hgb A1c MFr Bld: 7.3 % — ABNORMAL HIGH (ref 4.8–5.6)
Mean Plasma Glucose: 162.81 mg/dL

## 2019-06-12 LAB — TROPONIN I (HIGH SENSITIVITY)
Troponin I (High Sensitivity): 14 ng/L (ref <18)
Troponin I (High Sensitivity): 13 ng/L (ref <18)

## 2019-06-12 LAB — FERRITIN: Ferritin: 963 ng/mL — ABNORMAL HIGH (ref 24–336)

## 2019-06-12 LAB — MAGNESIUM: Magnesium: 1.9 mg/dL (ref 1.7–2.4)

## 2019-06-12 LAB — C-REACTIVE PROTEIN: CRP: 9.3 mg/dL — ABNORMAL HIGH (ref <1.0)

## 2019-06-12 LAB — ABO/RH
ABO/RH(D): O POS
ABO/RH(D): O POS

## 2019-06-12 LAB — PROCALCITONIN: Procalcitonin: <0.1 ng/mL

## 2019-06-12 LAB — D-DIMER, QUANTITATIVE: D-Dimer, Quant: 2.61 ug/mL-FEU — ABNORMAL HIGH (ref 0.00–0.50)

## 2019-06-12 MED ORDER — INSULIN ASPART 100 UNIT/ML ~~LOC~~ SOLN
0.0000 [IU] | Freq: Every day | SUBCUTANEOUS | Status: DC
  Administered 2019-06-12: 5 [IU] via SUBCUTANEOUS

## 2019-06-12 MED ORDER — SODIUM CHLORIDE 0.9% FLUSH
3.0000 mL | Freq: Two times a day (BID) | INTRAVENOUS | Status: DC
  Administered 2019-06-12 – 2019-06-16 (×10): 3 mL via INTRAVENOUS

## 2019-06-12 MED ORDER — RIVAROXABAN 20 MG PO TABS
20.0000 mg | ORAL_TABLET | Freq: Every day | ORAL | Status: DC
  Administered 2019-06-12 – 2019-06-15 (×4): 20 mg via ORAL
  Filled 2019-06-12 (×6): qty 1

## 2019-06-12 MED ORDER — ALBUTEROL SULFATE HFA 108 (90 BASE) MCG/ACT IN AERS
2.0000 | INHALATION_SPRAY | Freq: Four times a day (QID) | RESPIRATORY_TRACT | Status: DC
  Administered 2019-06-12 – 2019-06-13 (×6): 2 via RESPIRATORY_TRACT
  Filled 2019-06-12: qty 6.7

## 2019-06-12 MED ORDER — ONDANSETRON HCL 4 MG PO TABS: 4.0000 mg | ORAL_TABLET | Freq: Four times a day (QID) | ORAL | Status: DC | PRN

## 2019-06-12 MED ORDER — INSULIN ASPART 100 UNIT/ML ~~LOC~~ SOLN
0.0000 [IU] | Freq: Three times a day (TID) | SUBCUTANEOUS | Status: DC
  Administered 2019-06-12: 08:00:00 3 [IU] via SUBCUTANEOUS
  Administered 2019-06-12: 9 [IU] via SUBCUTANEOUS
  Administered 2019-06-12: 12:00:00 5 [IU] via SUBCUTANEOUS

## 2019-06-12 MED ORDER — INSULIN ASPART 100 UNIT/ML ~~LOC~~ SOLN
3.0000 [IU] | Freq: Three times a day (TID) | SUBCUTANEOUS | Status: DC
  Administered 2019-06-12 – 2019-06-13 (×5): 3 [IU] via SUBCUTANEOUS

## 2019-06-12 MED ORDER — LINAGLIPTIN 5 MG PO TABS
5.0000 mg | ORAL_TABLET | Freq: Every day | ORAL | Status: DC
  Administered 2019-06-12 – 2019-06-16 (×5): 5 mg via ORAL
  Filled 2019-06-12 (×5): qty 1

## 2019-06-12 MED ORDER — SODIUM CHLORIDE 0.9% IV SOLUTION: Freq: Once | INTRAVENOUS | Status: AC

## 2019-06-12 MED ORDER — DEXAMETHASONE SODIUM PHOSPHATE 10 MG/ML IJ SOLN
6.0000 mg | INTRAMUSCULAR | Status: DC
  Administered 2019-06-12 – 2019-06-15 (×4): 6 mg via INTRAVENOUS
  Filled 2019-06-12 (×5): qty 1

## 2019-06-12 MED ORDER — HYDROCODONE-ACETAMINOPHEN 5-325 MG PO TABS: 1.0000 | ORAL_TABLET | ORAL | Status: DC | PRN

## 2019-06-12 MED ORDER — ATORVASTATIN CALCIUM 10 MG PO TABS
20.0000 mg | ORAL_TABLET | Freq: Every day | ORAL | Status: DC
  Administered 2019-06-12 – 2019-06-15 (×5): 20 mg via ORAL
  Filled 2019-06-12 (×4): qty 2
  Filled 2019-06-12: qty 1
  Filled 2019-06-12: qty 2

## 2019-06-12 MED ORDER — ACETAMINOPHEN 325 MG PO TABS
650.0000 mg | ORAL_TABLET | Freq: Four times a day (QID) | ORAL | Status: DC | PRN
  Administered 2019-06-12: 650 mg via ORAL
  Filled 2019-06-12: qty 2

## 2019-06-12 MED ORDER — SODIUM CHLORIDE 0.9 % IV SOLN: INTRAVENOUS | Status: DC

## 2019-06-12 MED ORDER — INSULIN ASPART 100 UNIT/ML ~~LOC~~ SOLN
0.0000 [IU] | Freq: Three times a day (TID) | SUBCUTANEOUS | Status: DC
  Administered 2019-06-12 – 2019-06-13 (×2): 15 [IU] via SUBCUTANEOUS
  Administered 2019-06-13 – 2019-06-14 (×4): 8 [IU] via SUBCUTANEOUS
  Administered 2019-06-14 – 2019-06-15 (×2): 3 [IU] via SUBCUTANEOUS

## 2019-06-12 MED ORDER — GUAIFENESIN-DM 100-10 MG/5ML PO SYRP: 10.0000 mL | ORAL_SOLUTION | ORAL | Status: DC | PRN

## 2019-06-12 MED ORDER — INSULIN DETEMIR 100 UNIT/ML ~~LOC~~ SOLN
20.0000 [IU] | Freq: Every day | SUBCUTANEOUS | Status: DC
  Administered 2019-06-12: 05:00:00 20 [IU] via SUBCUTANEOUS
  Filled 2019-06-12 (×2): qty 0.2

## 2019-06-12 MED ORDER — ONDANSETRON HCL 4 MG/2ML IJ SOLN: 4.0000 mg | Freq: Four times a day (QID) | INTRAMUSCULAR | Status: DC | PRN

## 2019-06-12 MED ORDER — METOPROLOL TARTRATE 5 MG/5ML IV SOLN
2.5000 mg | Freq: Four times a day (QID) | INTRAVENOUS | Status: DC | PRN
  Administered 2019-06-12 – 2019-06-16 (×2): 2.5 mg via INTRAVENOUS
  Filled 2019-06-12 (×2): qty 5

## 2019-06-12 MED ORDER — SODIUM CHLORIDE 0.9 % IV SOLN: 200.0000 mg | Freq: Once | INTRAVENOUS | Status: DC

## 2019-06-12 MED ORDER — DEXAMETHASONE SODIUM PHOSPHATE 10 MG/ML IJ SOLN: 6.0000 mg | INTRAMUSCULAR | Status: DC

## 2019-06-12 MED ORDER — INSULIN ASPART 100 UNIT/ML ~~LOC~~ SOLN
0.0000 [IU] | SUBCUTANEOUS | Status: DC
  Administered 2019-06-12: 3 [IU] via SUBCUTANEOUS
  Filled 2019-06-12: qty 0.09

## 2019-06-12 MED ORDER — FAMOTIDINE IN NACL 20-0.9 MG/50ML-% IV SOLN
20.0000 mg | Freq: Two times a day (BID) | INTRAVENOUS | Status: DC
  Administered 2019-06-12 – 2019-06-15 (×6): 20 mg via INTRAVENOUS
  Filled 2019-06-12 (×6): qty 50

## 2019-06-12 MED ORDER — INSULIN ASPART 100 UNIT/ML ~~LOC~~ SOLN: 0.0000 [IU] | Freq: Three times a day (TID) | SUBCUTANEOUS | Status: DC

## 2019-06-12 MED ORDER — INSULIN DETEMIR 100 UNIT/ML ~~LOC~~ SOLN
10.0000 [IU] | Freq: Two times a day (BID) | SUBCUTANEOUS | Status: DC
  Administered 2019-06-12 – 2019-06-13 (×3): 10 [IU] via SUBCUTANEOUS
  Filled 2019-06-12 (×4): qty 0.1

## 2019-06-12 MED ORDER — SODIUM CHLORIDE 0.9 % IV SOLN: 100.0000 mg | Freq: Every day | INTRAVENOUS | Status: DC

## 2019-06-12 MED ORDER — METOPROLOL TARTRATE 25 MG PO TABS
12.5000 mg | ORAL_TABLET | Freq: Two times a day (BID) | ORAL | Status: DC
  Administered 2019-06-12 – 2019-06-16 (×10): 12.5 mg via ORAL
  Filled 2019-06-12 (×11): qty 1

## 2019-06-12 NOTE — Progress Notes (Signed)
[name] was updated by phone at patient's request. She expressed appreciation for the care he is receiving.

## 2019-06-12 NOTE — ED Notes (Signed)
[facility].

## 2019-06-12 NOTE — Progress Notes (Signed)
Notified Opyd, MD of Pt's change from green to yellow MEWS. Pt's vitals as follows: HR 100-120, R >30, 02 86-89% on 10L HFNC. Pt appears labored but does not endorse SOB.   Provider at bedside within 5 minutes of page sent. Upon providers arrival Pt's status had improved. Vitals: 02 90-93% on 9LHFNC, R 30, HR 100-105. Pt remains with labored breathing but does not complain of SOB or difficulty breathing. Will continue to monitor closely. MD to call and update Pts wife.  Neita Carp, RN

## 2019-06-12 NOTE — Progress Notes (Addendum)
[facility] PROGRESS NOTE    Progress Note  [name]  [id number] DOB: 1945-05-28 DOA: 06/11/2019 PCP: Cyndi Bender, PA-C     Brief Narrative:   [name] is an 74 y.o. male past medical history significant of diabetes mellitus type 2, chronic kidney disease stage III, CAD hypertension atrial fibrillation on Xarelto status post colectomy for colon cancer presents with shortness of breath, he went to his PCP and was found to be satting 85% on room air he said is gotten progressively worse over the last 5 days  Assessment/Plan:   Acute respiratory failure with hypoxia secondarily to pneumonia due to COVID-19: SARS-CoV-2 PCR test positive on 06/11/2019. Continue IV remdesivir and steroids currently requiring 3 L of oxygen to keep saturations greater 90%. His inflammatory markers are significantly elevated, he agreed to convalescent plasma.The treatment plan and use of medications and known side effects were discussed with patient/family, they were clearly explained that there is no proven definitive treatment for COVID-19 infection, any medications used here are based on published clinical articles/anecdotal data which are not peer-reviewed or randomized control trials.  Complete risks and long-term side effects are unknown, however in the best clinical judgment they seem to be of some clinical benefit rather than medical risks.  Patient/family agree with the treatment plan and want to receive the given medications. Try to keep the patient prone for at least 16 hours a day, not prone out of bed to chair, follow strict I's and O's and daily weights continue to use incentive spirometry and flutter valve. He has no leukocytosis, procalcitonin is pending.  A. fib with RVR: He was started on oral diltiazem drip, to keep heart rate less than 100.,  He was continue his home dose of metoprolol given now and wean him off IV diltiazem. Continue Xarelto.  Uncontrolled diabetes mellitus  type 2 with circulatory disorder and hyperglycemia: We will change sliding scale will put him on sensitive sliding scale continue Levemir. A1c is pending today.  Essential hypertension: No known baseline creatinine, previously has been 1.3-1.4 so ACE inhibitor was held he was started on gentle IV fluid hydration we will recheck a basic metabolic panel in the morning.  Seems to be well controlled we will monitor it closely.  Possible acute kidney injury: With a baseline creatinine 1.3-1.4, ACE inhibitor held he was started on gentle IV fluid hydration recheck basic metabolic panel in the morning question prerenal azotemia.  CAD of native vessels: Continue statins.    DVT prophylaxis: xarelto Family Communication:wife Disposition Plan/Barrier to D/C: Once he is off oxygen  Code Status:     Code Status Orders  (From admission, onward)         Start     Ordered   06/12/19 0159  Full code  Continuous     06/12/19 0159        Code Status History    Date Active Date Inactive Code Status Order ID Comments User Context   03/08/2018 1403 03/12/2018 1733 Full Code [id number]  Ileana Roup, MD Inpatient   06/14/2017 1116 06/15/2017 1901 Full Code [id number]  Phillips Grout, MD ED   06/03/2017 1513 06/07/2017 1954 Full Code [id number]  Ileana Roup, MD Inpatient   Advance Care Planning Activity    Advance Directive Documentation     Most Recent Value  Type of Advance Directive  Living will  Pre-existing out of facility DNR order (yellow form or pink MOST form)  --  "MOST" Form  in Place?  --        IV Access:    Peripheral IV   Procedures and diagnostic studies:   DG Chest Port 1 View  Result Date: 06/11/2019 CLINICAL DATA:  Increasing shortness of breath EXAM: PORTABLE CHEST 1 VIEW COMPARISON:  06/04/2017 FINDINGS: Cardiac shadow is mildly prominent but accentuated by the portable technique. Aortic calcifications are noted. Patchy opacities are noted within the  lungs bilaterally worse on the left than the right. Elevation of the right hemidiaphragm is again seen. No bony abnormality is noted. IMPRESSION: Patchy opacities consistent with multifocal pneumonia. Correlation with the pending COVID-19 testing is recommended. Electronically Signed   By: Inez Catalina M.D.   On: 06/11/2019 20:15     Medical Consultants:    None.  Anti-Infectives:   IV remdesivir  Subjective:    [name] relates his breathing continues to progressively get worse.  Objective:    Vitals:   06/12/19 0215 06/12/19 0259 06/12/19 0400 06/12/19 0500  BP: (!) 132/95 (!) 131/92  120/75  Pulse: 90 (!) 103    Resp: 19 20    Temp:   98.3 F (36.8 C) 97.9 F (36.6 C)  TempSrc:   Axillary Axillary  SpO2: 93% 93% 92%   Weight:      Height:       SpO2: 92 % O2 Flow Rate (L/min): 3 L/min   Intake/Output Summary (Last 24 hours) at 06/12/2019 0701 Last data filed at 06/12/2019 0600 Gross per 24 hour  Intake 347.69 ml  Output 550 ml  Net -202.31 ml   Filed Weights   06/11/19 1940  Weight: 108.9 kg    Exam: General exam: In no acute distress, extremely hard of hearing Respiratory system: Good air movement and diffuse crackles bilaterally Cardiovascular system: S1 & S2 heard, RRR. No JVD. Gastrointestinal system: Abdomen is nondistended, soft and nontender.  Central nervous system: Alert and oriented. No focal neurological deficits. Extremities: No pedal edema. Skin: No rashes, lesions or ulcers  Data Reviewed:    Labs: Basic Metabolic Panel: Recent Labs  Lab 06/11/19 2050 06/12/19 0442  NA 130*  --   K 4.1  --   CL 99  --   CO2 20*  --   GLUCOSE 247*  --   BUN 27*  --   CREATININE 1.68*  --   CALCIUM 8.3*  --   MG  --  1.9   GFR Estimated Creatinine Clearance: 52.2 mL/min (A) (by C-G formula based on SCr of 1.68 mg/dL (H)). Liver Function Tests: Recent Labs  Lab 06/11/19 2050  AST 51*  ALT 33  ALKPHOS 40  BILITOT 0.7  PROT 7.0   ALBUMIN 3.2*   No results for input(s): LIPASE, AMYLASE in the last 168 hours. No results for input(s): AMMONIA in the last 168 hours. Coagulation profile Recent Labs  Lab 06/11/19 2050  INR 1.2   COVID-19 Labs  Recent Labs    06/11/19 2050 06/12/19 0442  DDIMER 2.55* 2.61*  FERRITIN 945* 963*  LDH 255*  --   CRP 10.0* 9.3*    Lab Results  Component Value Date   SARSCOV2NAA POSITIVE (A) 06/11/2019    CBC: Recent Labs  Lab 06/11/19 2050  WBC 4.1  NEUTROABS 3.5  HGB 12.6*  HCT 37.1*  MCV 97.6  PLT PLATELET CLUMPS NOTED ON SMEAR, UNABLE TO ESTIMATE   Cardiac Enzymes: No results for input(s): CKTOTAL, CKMB, CKMBINDEX, TROPONINI in the last 168 hours. BNP (last 3  results) No results for input(s): PROBNP in the last 8760 hours. CBG: Recent Labs  Lab 06/12/19 0331  GLUCAP 247*   D-Dimer: Recent Labs    06/11/19 2050 06/12/19 0442  DDIMER 2.55* 2.61*   Hgb A1c: No results for input(s): HGBA1C in the last 72 hours. Lipid Profile: Recent Labs    06/11/19 2050  TRIG 87   Thyroid function studies: No results for input(s): TSH, T4TOTAL, T3FREE, THYROIDAB in the last 72 hours.  Invalid input(s): FREET3 Anemia work up: Recent Labs    06/11/19 2050 06/12/19 0442  FERRITIN 945* 963*   Sepsis Labs: Recent Labs  Lab 06/11/19 2050  PROCALCITON 0.16  WBC 4.1  LATICACIDVEN 1.7   Microbiology Recent Results (from the past 240 hour(s))  Culture, blood (Routine x 2)     Status: None (Preliminary result)   Collection Time: 06/11/19  7:49 PM   Specimen: BLOOD  Result Value Ref Range Status   Specimen Description BLOOD BLOOD RIGHT FOREARM  Final   Special Requests   Final    BOTTLES DRAWN AEROBIC AND ANAEROBIC Blood Culture adequate volume Performed at [facility] [address], [place], [place] [address]    Culture NO GROWTH < 12 HOURS  Final   Report Status PENDING  Incomplete  Culture, blood (Routine x 2)     Status: None  (Preliminary result)   Collection Time: 06/11/19  8:50 PM   Specimen: BLOOD  Result Value Ref Range Status   Specimen Description BLOOD RIGHT ANTECUBITAL  Final   Special Requests   Final    BOTTLES DRAWN AEROBIC AND ANAEROBIC Blood Culture results may not be optimal due to an excessive volume of blood received in culture bottles Performed at [facility], [place] [address], [place], [place] [address]    Culture NO GROWTH < 12 HOURS  Final   Report Status PENDING  Incomplete  Respiratory Panel by RT PCR (Flu A&B, Covid) - Nasopharyngeal Swab     Status: Abnormal   Collection Time: 06/11/19  8:50 PM   Specimen: Nasopharyngeal Swab  Result Value Ref Range Status   SARS Coronavirus 2 by RT PCR POSITIVE (A) NEGATIVE Final    Comment: RESULT CALLED TO, READ BACK BY AND VERIFIED WITH: BULLOCK,A @ [id number] ON [id number] BY POTEAT,S    Influenza A by PCR NEGATIVE NEGATIVE Final   Influenza B by PCR NEGATIVE NEGATIVE Final    Comment: Performed at [facility], [place] [address], [place], [place] [address]     Medications:   . albuterol  2 puff Inhalation Q6H  . atorvastatin  20 mg Oral QHS  . dexamethasone (DECADRON) injection  6 mg Intravenous Q24H  . insulin aspart  0-9 Units Subcutaneous Q4H  . insulin detemir  20 Units Subcutaneous QHS  . linagliptin  5 mg Oral Daily  . metoprolol tartrate  12.5 mg Oral BID  . rivaroxaban  20 mg Oral Q supper  . sodium chloride flush  3 mL Intravenous Q12H   Continuous Infusions: . sodium chloride 50 mL/hr at 06/12/19 0311  . diltiazem (CARDIZEM) infusion Stopped (06/12/19 0400)  . [START ON 06/13/2019] remdesivir 100 mg in NS 100 mL        LOS: 1 day   [facility]  06/12/2019, 7:01 AM

## 2019-06-12 NOTE — Plan of Care (Signed)
  Problem: Education: Goal: Knowledge of risk factors and measures for prevention of condition will improve Outcome: Progressing   

## 2019-06-12 NOTE — Progress Notes (Signed)
Pt admitted  from [place] at [place]. Pt alert and oriented. Resting comfortably on 3-4L [place]. Orders initiated and medications administered. Diltiazem gtt turned off at 0400. HR 60-80 Afibb. Wife notified of Pt's transfer to [facility].  Neita Carp, RN

## 2019-06-12 NOTE — Evaluation (Signed)
Physical Therapy Evaluation Patient Details Name: [name] MRN: [id number] DOB: [birth year] Today's Date: 06/12/2019   History of Present Illness  74 y.o. male with medical history significant of  DM2 insulin-dependent CKD stage III CAD, hypertension, atrial fibrillation on Xarelto, obesity, Colon Ca sp Colectomy and now sp colostomy take down. COVID + from ER 2/1.  Clinical Impression   Pt admitted with above hx and above dx. Prior to hospitalization pt was very independently living at home with spouse, was able to ambulate without AD and states did not need DME. He was ambulating at least 2 miles a day as tracked by his Iphone. This am, prior to PT eval pt was moving around room on his own completing morning toilette with stand by assist from nurse. At therapist arrival was sitting down and attempting to fix his hearing aides, pt is extremely [place] and usually does better with reading lips. He was able to get around room with stand by to min guard assist. Ambulated in hall approx 126ft with min guard and hand held assist, pt sats min 89% on 6L/min via Rudy, noted HR fluctuated between 110-140s with ambulation, cues needed and multi standing rest breaks to decrease HR. Pt will benefit from continued skilled acute care level PT while in hospital, at dc pt will benefit from home health PT.     Follow Up Recommendations Home health PT    Equipment Recommendations  None recommended by PT    Recommendations for Other Services OT consult     Precautions / Restrictions Precautions Precautions: Fall Precaution Comments: extemely HOH Restrictions Weight Bearing Restrictions: No      Mobility  Bed Mobility Overal bed mobility: Modified Independent                Transfers Overall transfer level: Needs assistance Equipment used: None Transfers: Sit to/from Stand;Stand Pivot Transfers Sit to Stand: Supervision;Modified independent (Device/Increase time) Stand pivot transfers:  Supervision;Modified independent (Device/Increase time)          Ambulation/Gait Ambulation/Gait assistance: Supervision;Min guard Gait Distance (Feet): 100 Feet Assistive device: 1 person hand held assist Gait Pattern/deviations: Step-through pattern Gait velocity: decreased with multi standing rest breaks   General Gait Details: ambulated on 4L/min initiallly and sats min 89% increased to 6L/min as was on 5L at rest but sats remained same with min 89%, HR increased to 140bpm but fluctuated between 110s-140s.  Stairs            Wheelchair Mobility    Modified Rankin (Stroke Patients Only)       Balance Overall balance assessment: Needs assistance Sitting-balance support: Feet supported Sitting balance-Leahy Scale: Good     Standing balance support: During functional activity;Single extremity supported Standing balance-Leahy Scale: Fair                               Pertinent Vitals/Pain Pain Assessment: No/denies pain    Home Living Family/patient expects to be discharged to:: Private residence Living Arrangements: Spouse/significant other Available Help at Discharge: Family Type of Home: House Home Access: Stairs to enter Entrance Stairs-Rails: (has built own Insurance account manager) Technical brewer of Steps: 2-3 Home Layout: One level Home Equipment: Walker - 2 wheels;Grab bars - toilet;Shower seat - built in      Prior Function Level of Independence: Independent         Comments: states that ambulated 29miles a day prior to illness  Hand Dominance        Extremity/Trunk Assessment   Upper Extremity Assessment Upper Extremity Assessment: Overall WFL for tasks assessed    Lower Extremity Assessment Lower Extremity Assessment: Overall WFL for tasks assessed    Cervical / Trunk Assessment Cervical / Trunk Assessment: Kyphotic(slight)  Communication   Communication: HOH(extreme hoh reads lips)  Cognition Arousal/Alertness:  Awake/alert Behavior During Therapy: WFL for tasks assessed/performed Overall Cognitive Status: Within Functional Limits for tasks assessed                                        General Comments      Exercises Other Exercises Other Exercises: incentive spirometer x 5  Other Exercises: flutter valve x 5   Assessment/Plan    PT Assessment Patient needs continued PT services  PT Problem List Decreased strength;Decreased activity tolerance;Decreased balance;Decreased mobility;Decreased coordination;Decreased safety awareness       PT Treatment Interventions Gait training;Functional mobility training;Therapeutic activities;Therapeutic exercise;Balance training;Neuromuscular re-education;Patient/family education    PT Goals (Current goals can be found in the Care Plan section)  Acute Rehab PT Goals Patient Stated Goal: to get moving PT Goal Formulation: With patient Time For Goal Achievement: 06/26/19 Potential to Achieve Goals: Good    Frequency Min 3X/week   Barriers to discharge        Co-evaluation               AM-PAC PT "6 Clicks" Mobility  Outcome Measure Help needed turning from your back to your side while in a flat bed without using bedrails?: None Help needed moving from lying on your back to sitting on the side of a flat bed without using bedrails?: None Help needed moving to and from a bed to a chair (including a wheelchair)?: A Little Help needed standing up from a chair using your arms (e.g., wheelchair or bedside chair)?: A Little Help needed to walk in hospital room?: A Little Help needed climbing 3-5 steps with a railing? : A Lot 6 Click Score: 19    End of Session Equipment Utilized During Treatment: Oxygen Activity Tolerance: Treatment limited secondary to medical complications (Comment);Patient limited by fatigue;Patient limited by lethargy Patient left: in chair;with call bell/phone within reach Nurse Communication: Mobility  status PT Visit Diagnosis: Other abnormalities of gait and mobility (R26.89);Muscle weakness (generalized) (M62.81)    Time: [id number] PT Time Calculation (min) (ACUTE ONLY): 28 min   Charges:   PT Evaluation $PT Eval Moderate Complexity: 1 Mod PT Treatments $Gait Training: 8-22 mins        Horald Chestnut, PT   Delford Field 06/12/2019, 12:41 PM

## 2019-06-12 NOTE — Progress Notes (Signed)
[name] was updated upon [name]' arrival to [facility].  All questions and concerns addressed.

## 2019-06-12 NOTE — ED Notes (Addendum)
ED TO INPATIENT HANDOFF REPORT  ED Nurse Name and Phone #: Fredonia Highland [phone]    S Name/Age/Gender Kathleene Hazel 74 y.o. male Room/Bed: WA16/WA16  Code Status   Code Status: Full Code  Home/SNF/Other Home Patient oriented to: self, place, time and situation Is this baseline? Yes   Triage Complete: Triage complete  Chief Complaint Suspected COVID-19 virus infection [Z20.822]  Triage Note Pt reports increasing shortness of breath. Was tested at [facility] today for [place] and awaiting results. Pt denies wearing oxygen at home and O2 saturation of 85% on room air. Pt family reports increasing weakness and shortness of breath since Wed.     Allergies No Known Allergies  Level of Care/Admitting Diagnosis ED Disposition    ED Disposition Condition [facility] Area: [place] [[id number]]  Level of Care: Progressive [102]  Covid Evaluation: Confirmed COVID Positive  Diagnosis: Suspected COVID-19 virus infection [id number]  Admitting Physician: Toy Baker [[id number]]  Attending Physician: Toy Baker [[id number]]  Estimated length of stay: 3 - 4 days  Certification:: I certify this patient will need inpatient services for at least 2 midnights       B Medical/Surgery History Past Medical History:  Diagnosis Date  . AAA (abdominal aortic aneurysm) ([place])   . Allergy   . Atrial fibrillation [facility]) October 2013   Xarelto started; duration unknown  . Cataract   . Chronic kidney disease    Stage III   . Coronary artery disease    post bare-metal stent to LAD in 2001  . Diabetes mellitus    type 2   . Hypertension   . Myocardial infarct ([place]) 2001  . Obesity    Past Surgical History:  Procedure Laterality Date  . CARDIAC CATHETERIZATION  2006   stable with primarily nonobstructive disease  . CARDIOVERSION  03/27/2012   Procedure: CARDIOVERSION;  Surgeon: Carlena Bjornstad, MD;  Location: [facility] ENDOSCOPY;  Service:  Cardiovascular;  Laterality: N/A;  . COLOSTOMY TAKEDOWN N/A 03/08/2018   Procedure: LAPAROSCOPIC HARTMANNS REVERSAL;  Surgeon: Ileana Roup, MD;  Location: [facility] ORS;  Service: General;  Laterality: N/A;  . CORONARY ANGIOPLASTY WITH STENT PLACEMENT  2001   bare-metal stent to the LAD  . CYSTOSCOPY W/ URETERAL STENT PLACEMENT Bilateral 03/08/2018   Procedure: CYSTOSCOPY WITH RETROGRADE AND BILATERAL URETERAL STENT PLACEMENT;  Surgeon: Lucas Mallow, MD;  Location: [facility] ORS;  Service: Urology;  Laterality: Bilateral;  . CYSTOSCOPY WITH STENT PLACEMENT Bilateral 06/03/2017   Procedure: CYSTOSCOPY WITH STENT PLACEMENT;  Surgeon: Festus Aloe, MD;  Location: [facility] ORS;  Service: Urology;  Laterality: Bilateral;  . FLEXIBLE SIGMOIDOSCOPY N/A 03/08/2018   Procedure: FLEXIBLE SIGMOIDOSCOPY;  Surgeon: Ileana Roup, MD;  Location: [facility] ORS;  Service: General;  Laterality: N/A;  . HERNIA REPAIR  2006  . LAPAROSCOPIC PARASTOMAL HERNIA N/A 03/08/2018   Procedure: PARASTOMAL HERNIA REPAIR;  Surgeon: Ileana Roup, MD;  Location: [facility] ORS;  Service: General;  Laterality: N/A;  . LAPAROSCOPIC SIGMOID COLECTOMY N/A 06/03/2017   Procedure: LAPAROSCOPIC  SIGMOID COLECTOMY, COLOSTOMY;  Surgeon: Ileana Roup, MD;  Location: [facility] ORS;  Service: General;  Laterality: N/A;  . SKIN GRAFT  1981     A IV Location/Drains/Wounds Patient Lines/Drains/Airways Status   Active Line/Drains/Airways    Name:   Placement date:   Placement time:   Site:   Days:   Peripheral IV 06/11/19 Right Antecubital   06/11/19    2030  Antecubital   1   Peripheral IV 06/11/19 Right Forearm   06/11/19    2051    Forearm   1   Colostomy LLQ   06/03/17    1127    LLQ   739   Incision (Closed) 06/03/17 Abdomen Other (Comment)   06/03/17    1215     739   Incision (Closed) 03/08/18 Abdomen Other (Comment)   03/08/18    1029     461   Incision - 3 Ports Abdomen Right;Lateral Right;Medial Right;Umbilicus   0000000     0830     461   Incision - 5 Ports Abdomen Upper;Left;Mid Left;Mid Mid Upper;Right;Mid Right;Mid   06/03/17    0830     739          Intake/Output Last 24 hours  Intake/Output Summary (Last 24 hours) at 06/12/2019 0231 Last data filed at 06/12/2019 0137 Gross per 24 hour  Intake 250 ml  Output 200 ml  Net 50 ml    Labs/Imaging Results for orders placed or performed during the hospital encounter of 06/11/19 (from the past 48 hour(s))  Comprehensive metabolic panel     Status: Abnormal   Collection Time: 06/11/19  8:50 PM  Result Value Ref Range   Sodium 130 (L) 135 - 145 mmol/L   Potassium 4.1 3.5 - 5.1 mmol/L   Chloride 99 98 - 111 mmol/L   CO2 20 (L) 22 - 32 mmol/L   Glucose, Bld 247 (H) 70 - 99 mg/dL   BUN 27 (H) 8 - 23 mg/dL   Creatinine, Ser 1.68 (H) 0.61 - 1.24 mg/dL   Calcium 8.3 (L) 8.9 - 10.3 mg/dL   Total Protein 7.0 6.5 - 8.1 g/dL   Albumin 3.2 (L) 3.5 - 5.0 g/dL   AST 51 (H) 15 - 41 U/L   ALT 33 0 - 44 U/L   Alkaline Phosphatase 40 38 - 126 U/L   Total Bilirubin 0.7 0.3 - 1.2 mg/dL   GFR calc non Af Amer 40 (L) >60 mL/min   GFR calc Af Amer 46 (L) >60 mL/min   Anion gap 11 5 - 15    Comment: Performed at [facility], [place] [address], [place], [place] [address]  Lactic acid, plasma     Status: None   Collection Time: 06/11/19  8:50 PM  Result Value Ref Range   Lactic Acid, Venous 1.7 0.5 - 1.9 mmol/L    Comment: Performed at [facility], [place] [address] [place], [place] [address]  CBC with Differential     Status: Abnormal   Collection Time: 06/11/19  8:50 PM  Result Value Ref Range   WBC 4.1 4.0 - 10.5 K/uL   RBC 3.80 (L) 4.22 - 5.81 MIL/uL   Hemoglobin 12.6 (L) 13.0 - 17.0 g/dL   HCT 37.1 (L) 39.0 - 52.0 %   MCV 97.6 80.0 - 100.0 fL   MCH 33.2 26.0 - 34.0 pg   MCHC 34.0 30.0 - 36.0 g/dL   RDW 13.2 11.5 - 15.5 %   Platelets PLATELET CLUMPS NOTED ON SMEAR, UNABLE TO ESTIMATE 150 - 400 K/uL    Comment: Immature  Platelet Fraction may be clinically indicated, consider ordering this additional test [id number]    nRBC 0.0 0.0 - 0.2 %   Neutrophils Relative % 86 %   Neutro Abs 3.5 1.7 - 7.7 K/uL   Lymphocytes Relative 9 %   Lymphs Abs 0.4 (L) 0.7 -  4.0 K/uL   Monocytes Relative 4 %   Monocytes Absolute 0.2 0.1 - 1.0 K/uL   Eosinophils Relative 0 %   Eosinophils Absolute 0.0 0.0 - 0.5 K/uL   Basophils Relative 0 %   Basophils Absolute 0.0 0.0 - 0.1 K/uL   Immature Granulocytes 1 %   Abs Immature Granulocytes 0.02 0.00 - 0.07 K/uL    Comment: Performed at [facility], [place] [address], [place], [place] [address]  Protime-INR     Status: None   Collection Time: 06/11/19  8:50 PM  Result Value Ref Range   Prothrombin Time 14.6 11.4 - 15.2 seconds   INR 1.2 0.8 - 1.2    Comment: (NOTE) INR goal varies based on device and disease states. Performed at [facility], [place] [address], [place], [place] [address]   Urinalysis, Routine w reflex microscopic     Status: Abnormal   Collection Time: 06/11/19  8:50 PM  Result Value Ref Range   Color, Urine YELLOW YELLOW   APPearance CLEAR CLEAR   Specific Gravity, Urine 1.027 1.005 - 1.030   pH 5.0 5.0 - 8.0   Glucose, UA >=500 (A) NEGATIVE mg/dL   Hgb urine dipstick MODERATE (A) NEGATIVE   Bilirubin Urine NEGATIVE NEGATIVE   Ketones, ur 5 (A) NEGATIVE mg/dL   Protein, ur 100 (A) NEGATIVE mg/dL   Nitrite NEGATIVE NEGATIVE   Leukocytes,Ua NEGATIVE NEGATIVE   RBC / HPF 0-5 0 - 5 RBC/hpf   WBC, UA 0-5 0 - 5 WBC/hpf   Bacteria, UA RARE (A) NONE SEEN   Mucus PRESENT     Comment: Performed at [facility], [place] [address], [place], [place] [address]  D-dimer, quantitative     Status: Abnormal   Collection Time: 06/11/19  8:50 PM  Result Value Ref Range   D-Dimer, Quant 2.55 (H) 0.00 - 0.50 ug/mL-FEU    Comment: (NOTE) At the manufacturer cut-off of 0.50 ug/mL FEU, this assay has been documented to  exclude PE with a sensitivity and negative predictive value of 97 to 99%.  At this time, this assay has not been approved by the FDA to exclude DVT/VTE. Results should be correlated with clinical presentation. Performed at [facility], [place] [address] [place],  [address]   Procalcitonin     Status: None   Collection Time: 06/11/19  8:50 PM  Result Value Ref Range   Procalcitonin 0.16 ng/mL    Comment:        Interpretation: PCT (Procalcitonin) <= 0.5 ng/mL: Systemic infection (sepsis) is not likely. Local bacterial infection is possible. (NOTE)       Sepsis PCT Algorithm           Lower Respiratory Tract                                      Infection PCT Algorithm    ----------------------------     ----------------------------         PCT < 0.25 ng/mL                PCT < 0.10 ng/mL         Strongly encourage             Strongly discourage   discontinuation of antibiotics    initiation of antibiotics    ----------------------------     -----------------------------  PCT 0.25 - 0.50 ng/mL            PCT 0.10 - 0.25 ng/mL               OR       >80% decrease in PCT            Discourage initiation of                                            antibiotics      Encourage discontinuation           of antibiotics    ----------------------------     -----------------------------         PCT >= 0.50 ng/mL              PCT 0.26 - 0.50 ng/mL               AND        <80% decrease in PCT             Encourage initiation of                                             antibiotics       Encourage continuation           of antibiotics    ----------------------------     -----------------------------        PCT >= 0.50 ng/mL                  PCT > 0.50 ng/mL               AND         increase in PCT                  Strongly encourage                                      initiation of antibiotics    Strongly encourage escalation           of antibiotics                                      -----------------------------                                           PCT <= 0.25 ng/mL                                                 OR                                        > 80% decrease in PCT  Discontinue / Do not initiate                                             antibiotics Performed at [place] [address], [place], [place] [address]   Lactate dehydrogenase     Status: Abnormal   Collection Time: 06/11/19  8:50 PM  Result Value Ref Range   LDH 255 (H) 98 - 192 U/L    Comment: Performed at [facility], [place] [address], [place], [place] [address]  Ferritin     Status: Abnormal   Collection Time: 06/11/19  8:50 PM  Result Value Ref Range   Ferritin 945 (H) 24 - 336 ng/mL    Comment: Performed at [facility], [place] [address], [place], [place] [address]  Triglycerides     Status: None   Collection Time: 06/11/19  8:50 PM  Result Value Ref Range   Triglycerides 87 <150 mg/dL    Comment: Performed at [facility], [place] [address] [place], [place] [address]  Fibrinogen     Status: Abnormal   Collection Time: 06/11/19  8:50 PM  Result Value Ref Range   Fibrinogen 677 (H) 210 - 475 mg/dL    Comment: Performed at [facility], [place] [address], [place], [place] [address]  C-reactive protein     Status: Abnormal   Collection Time: 06/11/19  8:50 PM  Result Value Ref Range   CRP 10.0 (H) <1.0 mg/dL    Comment: Performed at [facility], [place] [address], [place], [place] [address]  Respiratory Panel by RT PCR (Flu A&B, Covid) - Nasopharyngeal Swab     Status: Abnormal   Collection Time: 06/11/19  8:50 PM   Specimen: Nasopharyngeal Swab  Result Value Ref Range   SARS Coronavirus 2 by RT PCR POSITIVE (A) NEGATIVE    Comment: RESULT CALLED TO, READ BACK BY AND VERIFIED WITH: BULLOCK,A @ 2351 ON  [id number] BY POTEAT,S    Influenza A by PCR NEGATIVE NEGATIVE   Influenza B by PCR NEGATIVE NEGATIVE    Comment: Performed at [facility], [place] [address] [place], [place] [address]  ABO/Rh     Status: None   Collection Time: 06/11/19  9:04 PM  Result Value Ref Range   ABO/RH(D)      O POS Performed at [name], [place] [address] [place], [place] [address]   Troponin I (High Sensitivity)     Status: None   Collection Time: 06/12/19  1:10 AM  Result Value Ref Range   Troponin I (High Sensitivity) 14 <18 ng/L    Comment: (NOTE) Elevated high sensitivity troponin I (hsTnI) values and significant  changes across serial measurements may suggest ACS but many other  chronic and acute conditions are known to elevate hsTnI results.  Refer to the "Links" section for chest pain algorithms and additional  guidance. Performed at [facility], [place] [address], [place], [place] [address]    DG Chest Port 1 View  Result Date: 06/11/2019 CLINICAL DATA:  Increasing shortness of breath EXAM: PORTABLE CHEST 1 VIEW COMPARISON:  06/04/2017 FINDINGS: Cardiac shadow is mildly prominent but accentuated by the portable technique. Aortic calcifications are noted. Patchy opacities are noted within the lungs bilaterally worse on the left than the right. Elevation of the  right hemidiaphragm is again seen. No bony abnormality is noted. IMPRESSION: Patchy opacities consistent with multifocal pneumonia. Correlation with the pending COVID-19 testing is recommended. Electronically Signed   By: Inez Catalina M.D.   On: 06/11/2019 20:15    Pending Labs Unresulted Labs (From admission, onward)    Start     Ordered   06/12/19 0500  CBC with Differential/Platelet  Daily,   R     06/12/19 0159   06/12/19 0500  C-reactive protein  Daily,   R     06/12/19 0159   06/12/19 0500  D-dimer, quantitative (not at [facility])  Daily,   R     06/12/19 0159   06/12/19 0500   Ferritin  Daily,   R     06/12/19 0159   06/12/19 0500  Magnesium  Daily,   R     06/12/19 0159   06/12/19 0159  Culture, sputum-assessment  Once,   R     06/12/19 0159   06/11/19 1944  Culture, blood (Routine x 2)  BLOOD CULTURE X 2,   STAT     06/11/19 1943          Vitals/Pain Today's Vitals   06/12/19 0136 06/12/19 0137 06/12/19 0145 06/12/19 0215  BP:   (!) 147/99 (!) 132/95  Pulse: 95  92 90  Resp: 20  (!) 25 19  Temp:      TempSrc:      SpO2: 93%  94% 93%  Weight:      Height:      PainSc:  0-No pain      Isolation Precautions Airborne and Contact precautions  Medications Medications  diltiazem (CARDIZEM) 125 mg in dextrose 5% 125 mL (1 mg/mL) infusion (5 mg/hr Intravenous New Bag/Given 06/12/19 0106)  atorvastatin (LIPITOR) tablet 20 mg (has no administration in time range)  metoprolol tartrate (LOPRESSOR) tablet 12.5 mg (has no administration in time range)  rivaroxaban (XARELTO) tablet 20 mg (has no administration in time range)  sodium chloride flush (NS) 0.9 % injection 3 mL (has no administration in time range)  guaiFENesin-dextromethorphan (ROBITUSSIN DM) 100-10 MG/5ML syrup 10 mL (has no administration in time range)  acetaminophen (TYLENOL) tablet 650 mg (has no administration in time range)  HYDROcodone-acetaminophen (NORCO/VICODIN) 5-325 MG per tablet 1-2 tablet (has no administration in time range)  ondansetron (ZOFRAN) tablet 4 mg (has no administration in time range)    Or  ondansetron (ZOFRAN) injection 4 mg (has no administration in time range)  0.9 %  sodium chloride infusion (has no administration in time range)  albuterol (VENTOLIN HFA) 108 (90 Base) MCG/ACT inhaler 2 puff (has no administration in time range)  dexamethasone (DECADRON) injection 6 mg (has no administration in time range)  linagliptin (TRADJENTA) tablet 5 mg (has no administration in time range)  insulin aspart (novoLOG) injection 0-9 Units (has no administration in time range)   insulin detemir (LEVEMIR) injection 20 Units (has no administration in time range)  remdesivir 200 mg in sodium chloride 0.9% 250 mL IVPB (0 mg Intravenous Stopped 06/12/19 0137)    Followed by  remdesivir 100 mg in sodium chloride 0.9 % 100 mL IVPB (has no administration in time range)  dexamethasone (DECADRON) injection 6 mg (6 mg Intravenous Given 06/11/19 2044)  acetaminophen (TYLENOL) tablet 650 mg (650 mg Oral Given 06/12/19 0103)    Mobility walks with person assist Low fall risk   Focused Assessments   , Pulmonary Assessment Handoff:  Lung sounds:   O2  Device: Nasal Cannula O2 Flow Rate (L/min): 2 L/min      R Recommendations: See Admitting Provider Note  Report given to: Miquel Dunn RN  Additional Notes:

## 2019-06-13 ENCOUNTER — Inpatient Hospital Stay (HOSPITAL_COMMUNITY): Payer: Medicare Other

## 2019-06-13 ENCOUNTER — Inpatient Hospital Stay: Payer: Self-pay

## 2019-06-13 DIAGNOSIS — E7800 Pure hypercholesterolemia, unspecified: Secondary | ICD-10-CM

## 2019-06-13 DIAGNOSIS — I480 Paroxysmal atrial fibrillation: Secondary | ICD-10-CM

## 2019-06-13 DIAGNOSIS — N179 Acute kidney failure, unspecified: Secondary | ICD-10-CM

## 2019-06-13 LAB — C-REACTIVE PROTEIN: CRP: 6.3 mg/dL — ABNORMAL HIGH (ref <1.0)

## 2019-06-13 LAB — CBC WITH DIFFERENTIAL/PLATELET
Abs Immature Granulocytes: 0.04 10*3/uL (ref 0.00–0.07)
Basophils Absolute: 0 10*3/uL (ref 0.0–0.1)
Basophils Relative: 0 %
Eosinophils Absolute: 0 10*3/uL (ref 0.0–0.5)
Eosinophils Relative: 0 %
HCT: 33.4 % — ABNORMAL LOW (ref 39.0–52.0)
Hemoglobin: 11.1 g/dL — ABNORMAL LOW (ref 13.0–17.0)
Immature Granulocytes: 1 %
Lymphocytes Relative: 7 %
Lymphs Abs: 0.5 10*3/uL — ABNORMAL LOW (ref 0.7–4.0)
MCH: 33 pg (ref 26.0–34.0)
MCHC: 33.2 g/dL (ref 30.0–36.0)
MCV: 99.4 fL (ref 80.0–100.0)
Monocytes Absolute: 0.2 10*3/uL (ref 0.1–1.0)
Monocytes Relative: 3 %
Neutro Abs: 5.7 10*3/uL (ref 1.7–7.7)
Neutrophils Relative %: 89 %
Platelets: ADEQUATE 10*3/uL (ref 150–400)
RBC: 3.36 MIL/uL — ABNORMAL LOW (ref 4.22–5.81)
RDW: 13.4 % (ref 11.5–15.5)
WBC: 6.5 10*3/uL (ref 4.0–10.5)
nRBC: 0 % (ref 0.0–0.2)

## 2019-06-13 LAB — MAGNESIUM: Magnesium: 1.9 mg/dL (ref 1.7–2.4)

## 2019-06-13 LAB — COMPREHENSIVE METABOLIC PANEL
ALT: 37 U/L (ref 0–44)
AST: 44 U/L — ABNORMAL HIGH (ref 15–41)
Albumin: 2.8 g/dL — ABNORMAL LOW (ref 3.5–5.0)
Alkaline Phosphatase: 54 U/L (ref 38–126)
Anion gap: 9 (ref 5–15)
BUN: 35 mg/dL — ABNORMAL HIGH (ref 8–23)
CO2: 20 mmol/L — ABNORMAL LOW (ref 22–32)
Calcium: 8.2 mg/dL — ABNORMAL LOW (ref 8.9–10.3)
Chloride: 105 mmol/L (ref 98–111)
Creatinine, Ser: 1.57 mg/dL — ABNORMAL HIGH (ref 0.61–1.24)
GFR calc Af Amer: 50 mL/min — ABNORMAL LOW (ref >60)
GFR calc non Af Amer: 43 mL/min — ABNORMAL LOW (ref >60)
Glucose, Bld: 259 mg/dL — ABNORMAL HIGH (ref 70–99)
Potassium: 4.5 mmol/L (ref 3.5–5.1)
Sodium: 134 mmol/L — ABNORMAL LOW (ref 135–145)
Total Bilirubin: 0.2 mg/dL — ABNORMAL LOW (ref 0.3–1.2)
Total Protein: 6.1 g/dL — ABNORMAL LOW (ref 6.5–8.1)

## 2019-06-13 LAB — BPAM FFP
Blood Product Expiration Date: 202102031202
ISSUE DATE / TIME: 202102021208
Unit Type and Rh: 9500

## 2019-06-13 LAB — PREPARE FRESH FROZEN PLASMA: Unit division: 0

## 2019-06-13 LAB — D-DIMER, QUANTITATIVE: D-Dimer, Quant: 2.67 ug/mL-FEU — ABNORMAL HIGH (ref 0.00–0.50)

## 2019-06-13 LAB — GLUCOSE, CAPILLARY
Glucose-Capillary: 193 mg/dL — ABNORMAL HIGH (ref 70–99)
Glucose-Capillary: 242 mg/dL — ABNORMAL HIGH (ref 70–99)
Glucose-Capillary: 243 mg/dL — ABNORMAL HIGH (ref 70–99)
Glucose-Capillary: 292 mg/dL — ABNORMAL HIGH (ref 70–99)
Glucose-Capillary: 294 mg/dL — ABNORMAL HIGH (ref 70–99)
Glucose-Capillary: 361 mg/dL — ABNORMAL HIGH (ref 70–99)
Glucose-Capillary: 392 mg/dL — ABNORMAL HIGH (ref 70–99)

## 2019-06-13 LAB — PHOSPHORUS: Phosphorus: 2.3 mg/dL — ABNORMAL LOW (ref 2.5–4.6)

## 2019-06-13 LAB — PROCALCITONIN: Procalcitonin: <0.1 ng/mL

## 2019-06-13 LAB — FERRITIN: Ferritin: 961 ng/mL — ABNORMAL HIGH (ref 24–336)

## 2019-06-13 MED ORDER — SODIUM CHLORIDE 0.9% FLUSH
10.0000 mL | INTRAVENOUS | Status: DC | PRN
  Administered 2019-06-14: 22:00:00 10 mL

## 2019-06-13 MED ORDER — ZINC SULFATE 220 (50 ZN) MG PO CAPS
220.0000 mg | ORAL_CAPSULE | Freq: Every day | ORAL | Status: DC
  Administered 2019-06-13 – 2019-06-16 (×4): 220 mg via ORAL
  Filled 2019-06-13 (×4): qty 1

## 2019-06-13 MED ORDER — INFLUENZA VAC A&B SA ADJ QUAD 0.5 ML IM PRSY
0.5000 mL | PREFILLED_SYRINGE | INTRAMUSCULAR | Status: DC
  Filled 2019-06-13: qty 0.5

## 2019-06-13 MED ORDER — PNEUMOCOCCAL VAC POLYVALENT 25 MCG/0.5ML IJ INJ
0.5000 mL | INJECTION | INTRAMUSCULAR | Status: DC
  Filled 2019-06-13: qty 0.5

## 2019-06-13 MED ORDER — INSULIN DETEMIR 100 UNIT/ML ~~LOC~~ SOLN
15.0000 [IU] | Freq: Two times a day (BID) | SUBCUTANEOUS | Status: DC
  Administered 2019-06-13 – 2019-06-14 (×2): 15 [IU] via SUBCUTANEOUS
  Filled 2019-06-13 (×3): qty 0.15

## 2019-06-13 MED ORDER — CHLORHEXIDINE GLUCONATE CLOTH 2 % EX PADS
6.0000 | MEDICATED_PAD | Freq: Every day | CUTANEOUS | Status: DC
  Administered 2019-06-13 – 2019-06-16 (×4): 6 via TOPICAL

## 2019-06-13 MED ORDER — ASCORBIC ACID 500 MG PO TABS
500.0000 mg | ORAL_TABLET | Freq: Every day | ORAL | Status: DC
  Administered 2019-06-13 – 2019-06-16 (×4): 500 mg via ORAL
  Filled 2019-06-13 (×4): qty 1

## 2019-06-13 MED ORDER — SODIUM CHLORIDE 0.9% FLUSH
10.0000 mL | Freq: Two times a day (BID) | INTRAVENOUS | Status: DC
  Administered 2019-06-13 – 2019-06-16 (×6): 10 mL

## 2019-06-13 MED ORDER — IPRATROPIUM-ALBUTEROL 20-100 MCG/ACT IN AERS
1.0000 | INHALATION_SPRAY | Freq: Four times a day (QID) | RESPIRATORY_TRACT | Status: DC
  Administered 2019-06-13 – 2019-06-16 (×12): 1 via RESPIRATORY_TRACT
  Filled 2019-06-13: qty 4

## 2019-06-13 MED ORDER — INSULIN ASPART 100 UNIT/ML ~~LOC~~ SOLN
6.0000 [IU] | Freq: Three times a day (TID) | SUBCUTANEOUS | Status: DC
  Administered 2019-06-13 – 2019-06-14 (×3): 6 [IU] via SUBCUTANEOUS

## 2019-06-13 NOTE — Progress Notes (Signed)
Physical Therapy Treatment Patient Details Name: [name] MRN: [id number] DOB: 1945-10-10 Today's Date: 06/13/2019    History of Present Illness 74 y.o. male with medical history significant of  DM2 insulin-dependent CKD stage III CAD, hypertension, atrial fibrillation on Xarelto, obesity, Colon Ca sp Colectomy and now sp colostomy take down. COVID + from ER 2/1.    PT Comments    Pt doing better with independence and activity tolerance this am, but still highly impulsive with very poor insight into safety and own deficits. Arrived in room to find pt sats in low 80s and 02 canula not in nasal cavity, pt was attempting to use urinal. States most important thing is not to make a mess. Therapist educated patient, via writing on make shift white board to ensure maximum understanding, importance of 02 and saturations and brain functioning. He verbalizes understanding but throughout session did not demonstrate this as needed continued reinforcement of concept. He was able to complete breathing exercises with cues, able to complete stand clean up without loss of balance with set up and close stand by assist. Pt also ambulated approx 1x 146ft with hand held assist, the 1 x 280ft with IV pole and min guard assist. Pt was on 5L/min via [place] at rest and sats in low 90s with ambulation increased to 6L/min noted desaturation into low 80s and increase in HR to max 150s. Pt extensively educated on safety with mobility and need to monitor 02, he had asked by monitor kept alarming, but carry over is very minimal. Pt will benefit from continued skilled PT tx while in hospital to address remaining deficits in safety, independence, also activity tolerance.    Follow Up Recommendations  Home health PT     Equipment Recommendations  None recommended by PT    Recommendations for Other Services OT consult     Precautions / Restrictions Precautions Precautions: Fall Precaution Comments: extemely  HOH Restrictions Weight Bearing Restrictions: No    Mobility  Bed Mobility Overal bed mobility: Modified Independent                Transfers Overall transfer level: Needs assistance Equipment used: None Transfers: Sit to/from Stand;Stand Pivot Transfers Sit to Stand: Supervision Stand pivot transfers: Supervision          Ambulation/Gait Ambulation/Gait assistance: Min guard Gait Distance (Feet): 200 Feet Assistive device: IV Pole Gait Pattern/deviations: Step-through pattern;Wide base of support Gait velocity: decreased   General Gait Details: (amb on 6L/min via [place] desat to low 80s, HR max 150s)   Stairs             Wheelchair Mobility    Modified Rankin (Stroke Patients Only)       Balance Overall balance assessment: Needs assistance Sitting-balance support: Feet supported Sitting balance-Leahy Scale: Good     Standing balance support: During functional activity Standing balance-Leahy Scale: Fair Standing balance comment: stands at sink and able to do wash up w/o LOB                            Cognition Arousal/Alertness: Awake/alert Behavior During Therapy: Impulsive Overall Cognitive Status: Within Functional Limits for tasks assessed                                        Exercises Other Exercises Other Exercises: incentive spirometer x 10  Other Exercises: flutter valve x 10    General Comments        Pertinent Vitals/Pain Pain Assessment: No/denies pain    Home Living                      Prior Function            PT Goals (current goals can now be found in the care plan section) Acute Rehab PT Goals Patient Stated Goal: get out of bed PT Goal Formulation: With patient Time For Goal Achievement: 06/26/19 Potential to Achieve Goals: Good Progress towards PT goals: Progressing toward goals    Frequency    Min 3X/week      PT Plan Current plan remains appropriate     Co-evaluation              AM-PAC PT "6 Clicks" Mobility   Outcome Measure  Help needed turning from your back to your side while in a flat bed without using bedrails?: None Help needed moving from lying on your back to sitting on the side of a flat bed without using bedrails?: None Help needed moving to and from a bed to a chair (including a wheelchair)?: A Little Help needed standing up from a chair using your arms (e.g., wheelchair or bedside chair)?: A Little Help needed to walk in hospital room?: A Little Help needed climbing 3-5 steps with a railing? : A Lot 6 Click Score: 19    End of Session Equipment Utilized During Treatment: Oxygen;Gait belt Activity Tolerance: Treatment limited secondary to medical complications (Comment) Patient left: in chair;with call bell/phone within reach;with chair alarm set Nurse Communication: Mobility status PT Visit Diagnosis: Other abnormalities of gait and mobility (R26.89);Muscle weakness (generalized) (M62.81)     Time: [id number] PT Time Calculation (min) (ACUTE ONLY): 50 min  Charges:  $Gait Training: 23-37 mins $Therapeutic Exercise: 8-22 mins                     Horald Chestnut, PT    Delford Field 06/13/2019, 12:00 PM

## 2019-06-13 NOTE — Progress Notes (Signed)
PROGRESS NOTE    [name]  [id number] DOB: [birth year] DOA: 06/11/2019 PCP: Cyndi Bender, PA-C   Brief Narrative:  74 y.o. WM PMHx  diabetes mellitus type 2, chronic kidney disease stage III, CAD hypertension atrial fibrillation on Xarelto s/p Colectomy for Colon cancer   Presents with shortness of breath, he went to his PCP and was found to be satting 85% on room air he said is gotten progressively worse over the last 5 days   Subjective: A/O x4, positive hard of hearing, positive S OB   Assessment & Plan:   Active Problems:   Hyperlipidemia   HYPERTENSION, BENIGN   CAD, NATIVE VESSEL   Uncontrolled type 2 diabetes mellitus with circulatory disorder   A-fib (HCC)   Chronic anticoagulation   Suspected COVID-19 virus infection   COVID-19 virus infection   Pneumonia due to COVID-19 virus   Acute respiratory failure with hypoxia (HCC)   AKI (acute kidney injury) ([place])   Covid pneumonia/acute respiratory failure with hypoxia COVID-19 Labs  Recent Labs    06/11/19 2050 06/12/19 0442 06/13/19 0259  DDIMER 2.55* 2.61* 2.67*  FERRITIN 945* 963* 961*  LDH 255*  --   --   CRP 10.0* 9.3* 6.3*    Lab Results  Component Value Date   SARSCOV2NAA POSITIVE (A) 06/11/2019   -Decadron 6 mg daily -Remdesivir per pharmacy protocol -Vitamins per pharmacy protocol -Flutter valve -Incentive spirometry -Combivent -Titrate O2 to maintain SPO2> 88%  A. fib with RVR -Currently NSR -Metoprolol 12.5 mg BID -Metoprolol PRN -Continue Xarelto  Essential HTN -See A. fib RVR  CAD of native vessels  -Lipitor 20 mg daily -Lipid panel pending  Uncontrolled diabetes type 2 with complication -2/2 hemoglobin A1c= 7.3 -2/3 increase Levemir 15 units BID -2/3 increase NovoLog 6 units qac -Moderate SSI  Acute kidney injury (baseline Cr 1.3) -Hold ACE inhibitor -Normal saline 98ml/hr Recent Labs  Lab 06/11/19 2050 06/13/19 0259  CREATININE 1.68* 1.57*      DVT  prophylaxis: Xarelto Code Status: Full Family Communication:  Disposition Plan:    Consultants:    Procedures/Significant Events:     I have personally reviewed and interpreted all radiology studies and my findings are as above.  VENTILATOR SETTINGS: HFNC 2/3 Flow; 5+ liters per minute SPO2; 90%    Cultures 2/1 respiratory virus panel positive SARS coronavirus 2 by PCR    Antimicrobials: Anti-infectives (From admission, onward)   Start     Dose/Rate Stop   06/13/19 1000  remdesivir 100 mg in sodium chloride 0.9 % 100 mL IVPB  Status:  Discontinued     100 mg 200 mL/hr over 30 Minutes 06/12/19 0200   06/13/19 1000  remdesivir 100 mg in sodium chloride 0.9 % 100 mL IVPB     100 mg 200 mL/hr over 30 Minutes 06/17/19 0959   06/12/19 0200  remdesivir 200 mg in sodium chloride 0.9% 250 mL IVPB  Status:  Discontinued     200 mg 580 mL/hr over 30 Minutes 06/12/19 0200   06/11/19 2359  remdesivir 200 mg in sodium chloride 0.9% 250 mL IVPB     200 mg 580 mL/hr over 30 Minutes 06/12/19 0137       Devices    LINES / TUBES:      Continuous Infusions: . sodium chloride 50 mL/hr at 06/12/19 0311  . famotidine (PEPCID) IV 20 mg (06/12/19 2231)  . remdesivir 100 mg in NS 100 mL       Objective: Vitals:  06/12/19 2233 06/13/19 0001 06/13/19 0302 06/13/19 0700  BP: 116/72 104/68  117/71  Pulse: 97   94  Resp: 19 (!) 24  20  Temp: 99.3 F (37.4 C) 99.5 F (37.5 C) 98.6 F (37 C) 98.4 F (36.9 C)  TempSrc: Oral Axillary Axillary Oral  SpO2: 95%   90%  Weight:      Height:        Intake/Output Summary (Last 24 hours) at 06/13/2019 0743 Last data filed at 06/13/2019 0500 Gross per 24 hour  Intake 1823.52 ml  Output 1300 ml  Net 523.52 ml   Filed Weights   06/11/19 1940  Weight: 108.9 kg    Examination:  General: A/O x4, positive acute respiratory distress Eyes: negative scleral hemorrhage, negative anisocoria, negative icterus ENT: Negative Runny  nose, negative gingival bleeding, hard of hearing Neck:  Negative scars, masses, torticollis, lymphadenopathy, JVD Lungs: Clear to auscultation bilaterally without wheezes or crackles Cardiovascular: Regular rate and rhythm without murmur gallop or rub normal S1 and S2 Abdomen: negative abdominal pain, nondistended, positive soft, bowel sounds, no rebound, no ascites, no appreciable mass Extremities: No significant cyanosis, clubbing, or edema bilateral lower extremities Skin: Negative rashes, lesions, ulcers Psychiatric:  Negative depression, negative anxiety, negative fatigue, negative mania  Central nervous system:  Cranial nerves II through XII intact, tongue/uvula midline, all extremities muscle strength 5/5, sensation intact throughout, negative dysarthria, negative expressive aphasia, negative receptive aphasia.  .     Data Reviewed: Care during the described time interval was provided by me .  I have reviewed this patient's available data, including medical history, events of note, physical examination, and all test results as part of my evaluation.   CBC: Recent Labs  Lab 06/11/19 2050 06/12/19 0442 06/13/19 0259  WBC 4.1 2.1* 6.5  NEUTROABS 3.5 1.6* 5.7  HGB 12.6* 12.1* 11.1*  HCT 37.1* 34.8* 33.4*  MCV 97.6 97.2 99.4  PLT PLATELET CLUMPS NOTED ON SMEAR, UNABLE TO ESTIMATE PLATELET CLUMPS NOTED ON SMEAR, COUNT APPEARS ADEQUATE PLATELET CLUMPS NOTED ON SMEAR, COUNT APPEARS ADEQUATE   Basic Metabolic Panel: Recent Labs  Lab 06/11/19 2050 06/12/19 0442 06/13/19 0259  NA 130*  --   --   K 4.1  --   --   CL 99  --   --   CO2 20*  --   --   GLUCOSE 247*  --   --   BUN 27*  --   --   CREATININE 1.68*  --   --   CALCIUM 8.3*  --   --   MG  --  1.9 1.9   GFR: Estimated Creatinine Clearance: 52.2 mL/min (A) (by C-G formula based on SCr of 1.68 mg/dL (H)). Liver Function Tests: Recent Labs  Lab 06/11/19 2050  AST 51*  ALT 33  ALKPHOS 40  BILITOT 0.7  PROT 7.0   ALBUMIN 3.2*   No results for input(s): LIPASE, AMYLASE in the last 168 hours. No results for input(s): AMMONIA in the last 168 hours. Coagulation Profile: Recent Labs  Lab 06/11/19 2050  INR 1.2   Cardiac Enzymes: No results for input(s): CKTOTAL, CKMB, CKMBINDEX, TROPONINI in the last 168 hours. BNP (last 3 results) No results for input(s): PROBNP in the last 8760 hours. HbA1C: Recent Labs    06/12/19 0442  HGBA1C 7.3*   CBG: Recent Labs  Lab 06/12/19 1610 06/12/19 1925 06/12/19 2045 06/12/19 2356 06/13/19 0405  GLUCAP 357* 477* 392* 243* 242*   Lipid Profile: Recent  Labs    06/11/19 2050  TRIG 87   Thyroid Function Tests: No results for input(s): TSH, T4TOTAL, FREET4, T3FREE, THYROIDAB in the last 72 hours. Anemia Panel: Recent Labs    06/12/19 0442 06/13/19 0259  FERRITIN 963* 961*   Urine analysis:    Component Value Date/Time   COLORURINE YELLOW 06/11/2019 2050   APPEARANCEUR CLEAR 06/11/2019 2050   LABSPEC 1.027 06/11/2019 2050   PHURINE 5.0 06/11/2019 2050   GLUCOSEU >=500 (A) 06/11/2019 2050   HGBUR MODERATE (A) 06/11/2019 2050   [place] NEGATIVE 06/11/2019 2050   KETONESUR 5 (A) 06/11/2019 2050   PROTEINUR 100 (A) 06/11/2019 2050   NITRITE NEGATIVE 06/11/2019 2050   LEUKOCYTESUR NEGATIVE 06/11/2019 2050   Sepsis Labs: @LABRCNTIP (procalcitonin:4,lacticidven:4)  ) Recent Results (from the past 240 hour(s))  Culture, blood (Routine x 2)     Status: None (Preliminary result)   Collection Time: 06/11/19  7:49 PM   Specimen: BLOOD  Result Value Ref Range Status   Specimen Description BLOOD BLOOD RIGHT FOREARM  Final   Special Requests   Final    BOTTLES DRAWN AEROBIC AND ANAEROBIC Blood Culture adequate volume Performed at [facility], [place] [address], [place], [place] [address]    Culture NO GROWTH < 12 HOURS  Final   Report Status PENDING  Incomplete  Culture, blood (Routine x 2)     Status: None (Preliminary  result)   Collection Time: 06/11/19  8:50 PM   Specimen: BLOOD  Result Value Ref Range Status   Specimen Description BLOOD RIGHT ANTECUBITAL  Final   Special Requests   Final    BOTTLES DRAWN AEROBIC AND ANAEROBIC Blood Culture results may not be optimal due to an excessive volume of blood received in culture bottles Performed at [facility], [place] [address], [place], [place] [address]    Culture NO GROWTH < 12 HOURS  Final   Report Status PENDING  Incomplete  Respiratory Panel by RT PCR (Flu A&B, Covid) - Nasopharyngeal Swab     Status: Abnormal   Collection Time: 06/11/19  8:50 PM   Specimen: Nasopharyngeal Swab  Result Value Ref Range Status   SARS Coronavirus 2 by RT PCR POSITIVE (A) NEGATIVE Final    Comment: RESULT CALLED TO, READ BACK BY AND VERIFIED WITH: BULLOCK,A @ [id number] ON [id number] BY POTEAT,S    Influenza A by PCR NEGATIVE NEGATIVE Final   Influenza B by PCR NEGATIVE NEGATIVE Final    Comment: Performed at [facility], [place] [address], [place], [place] [address]  Culture, sputum-assessment     Status: None   Collection Time: 06/12/19  3:49 AM   Specimen: Sputum  Result Value Ref Range Status   Specimen Description SPU  Final   Special Requests NONE  Final   Sputum evaluation   Final    THIS SPECIMEN IS ACCEPTABLE FOR SPUTUM CULTURE Performed at [facility], [place] [address] [place], [place] [address]    Report Status 06/12/2019 FINAL  Final  Culture, respiratory     Status: None (Preliminary result)   Collection Time: 06/12/19  3:49 AM   Specimen: Sputum  Result Value Ref Range Status   Specimen Description   Final    SPU Performed at [facility] [address] [place], [place] [address]    Special Requests   Final    NONE Reflexed from [id number] Performed at [facility] [address] [place], [place]  [id number]    Gram Stain   Final    RARE WBC PRESENT,  PREDOMINANTLY PMN MODERATE SQUAMOUS EPITHELIAL CELLS PRESENT ABUNDANT GRAM POSITIVE COCCI MODERATE YEAST RARE GRAM POSITIVE RODS Performed at [facility], [place] [address], [place], [place] [address]    Culture PENDING  Incomplete   Report Status PENDING  Incomplete         Radiology Studies: DG Chest Port 1 View  Result Date: 06/11/2019 CLINICAL DATA:  Increasing shortness of breath EXAM: PORTABLE CHEST 1 VIEW COMPARISON:  06/04/2017 FINDINGS: Cardiac shadow is mildly prominent but accentuated by the portable technique. Aortic calcifications are noted. Patchy opacities are noted within the lungs bilaterally worse on the left than the right. Elevation of the right hemidiaphragm is again seen. No bony abnormality is noted. IMPRESSION: Patchy opacities consistent with multifocal pneumonia. Correlation with the pending COVID-19 testing is recommended. Electronically Signed   By: Inez Catalina M.D.   On: 06/11/2019 20:15        Scheduled Meds: . albuterol  2 puff Inhalation Q6H  . atorvastatin  20 mg Oral QHS  . dexamethasone (DECADRON) injection  6 mg Intravenous Q24H  . insulin aspart  0-15 Units Subcutaneous TID WC  . insulin aspart  0-5 Units Subcutaneous QHS  . insulin aspart  3 Units Subcutaneous TID WC  . insulin detemir  10 Units Subcutaneous BID  . linagliptin  5 mg Oral Daily  . metoprolol tartrate  12.5 mg Oral BID  . rivaroxaban  20 mg Oral Q supper  . sodium chloride flush  3 mL Intravenous Q12H   Continuous Infusions: . sodium chloride 50 mL/hr at 06/12/19 0311  . famotidine (PEPCID) IV 20 mg (06/12/19 2231)  . remdesivir 100 mg in NS 100 mL       LOS: 2 days   The patient is critically ill with multiple organ systems failure and requires high complexity decision making for assessment and support, frequent evaluation and titration of therapies, application of advanced monitoring technologies and extensive interpretation of multiple databases. Critical Care  Time devoted to patient care services described in this note  Time spent: 40 minutes     Hattye Siegfried, Geraldo Docker, MD [facility] Pager [phone]  If 7PM-7AM, please contact night-coverage [email or web address] Password [facility] 06/13/2019, 7:43 AM

## 2019-06-13 NOTE — Progress Notes (Signed)
Received order for Midline.  Due to patients medications recommend a PICC be placed.

## 2019-06-13 NOTE — Progress Notes (Signed)
Inpatient Diabetes Program Recommendations  AACE/ADA: New Consensus Statement on Inpatient Glycemic Control   Target Ranges:  Prepandial:   less than 140 mg/dL      Peak postprandial:   less than 180 mg/dL (1-2 hours)      Critically ill patients:  140 - 180 mg/dL   Results for [name], [name] (MRN [id number]) as of 06/13/2019 10:34  Ref. Range 06/12/2019 07:09 06/12/2019 11:35 06/12/2019 16:10 06/12/2019 19:25 06/12/2019 20:45 06/12/2019 23:56 06/13/2019 04:05 06/13/2019 07:33  Glucose-Capillary Latest Ref Range: 70 - 99 mg/dL 211 (H) 284 (H) 357 (H) 477 (H) 392 (H) 243 (H) 242 (H) 294 (H)   Review of Glycemic Control  Diabetes history: DM2 Outpatient Diabetes medications: Trulicity 3 mg Qweek, 0000000 25 units BID Current orders for Inpatient glycemic control: Levemir 10 units BID, Novolog 0-15 units TID with meals, Novolog 0-5 units QHS, Novolog 3 units TID with meals, Tradjenta 5 mg daily; Decadron 6 mg Q24H  Inpatient Diabetes Program Recommendations:   Insulin - Basal: If steroids are continued, please consider increasing Levemir to 20 units BID.  Insulin-Meal Coverage: If steroids are continued, please consider increasing meal coverage to Novolog 6 units TID with meals if patient eats at least 50% of meals.  Thanks, Barnie Alderman, RN, MSN, CDE Diabetes Coordinator Inpatient Diabetes Program [phone] (Team Pager from 8am to 5pm)

## 2019-06-13 NOTE — Progress Notes (Signed)
Peripherally Inserted Central Catheter/Midline Placement  The IV Nurse has discussed with the patient and/or persons authorized to consent for the patient, the purpose of this procedure and the potential benefits and risks involved with this procedure.  The benefits include less needle sticks, lab draws from the catheter, and the patient may be discharged home with the catheter. Risks include, but not limited to, infection, bleeding, blood clot (thrombus formation), and puncture of an artery; nerve damage and irregular heartbeat and possibility to perform a PICC exchange if needed/ordered by physician.  Alternatives to this procedure were also discussed.  Bard Power PICC patient education guide, fact sheet on infection prevention and patient information card has been provided to patient /or left at bedside.    PICC/Midline Placement Documentation  PICC Double Lumen 06/13/19 PICC Right Brachial 43 cm 0 cm (Active)  Indication for Insertion or Continuance of Line Limited venous access - need for IV therapy >5 days (PICC only) 06/13/19 1743  Exposed Catheter (cm) 0 cm 06/13/19 1743  Site Assessment Clean;Dry;Intact 06/13/19 1743  Lumen #1 Status Flushed;Blood return noted 06/13/19 1743  Lumen #2 Status Flushed;Blood return noted 06/13/19 1743  Dressing Type Transparent 06/13/19 1743  Dressing Status Dry;Clean;Intact;Antimicrobial disc in place;Other (Comment) 06/13/19 1743  Dressing Intervention New dressing 06/13/19 [id number]  Dressing Change Due 06/20/19 06/13/19 1743    Telephone consent signed by wife   Synthia Innocent 06/13/2019, 5:45 PM

## 2019-06-14 LAB — FERRITIN: Ferritin: 813 ng/mL — ABNORMAL HIGH (ref 24–336)

## 2019-06-14 LAB — COMPREHENSIVE METABOLIC PANEL
ALT: 39 U/L (ref 0–44)
AST: 46 U/L — ABNORMAL HIGH (ref 15–41)
Albumin: 3 g/dL — ABNORMAL LOW (ref 3.5–5.0)
Alkaline Phosphatase: 53 U/L (ref 38–126)
Anion gap: 8 (ref 5–15)
BUN: 32 mg/dL — ABNORMAL HIGH (ref 8–23)
CO2: 21 mmol/L — ABNORMAL LOW (ref 22–32)
Calcium: 8.6 mg/dL — ABNORMAL LOW (ref 8.9–10.3)
Chloride: 107 mmol/L (ref 98–111)
Creatinine, Ser: 1.42 mg/dL — ABNORMAL HIGH (ref 0.61–1.24)
GFR calc Af Amer: 56 mL/min — ABNORMAL LOW (ref >60)
GFR calc non Af Amer: 49 mL/min — ABNORMAL LOW (ref >60)
Glucose, Bld: 259 mg/dL — ABNORMAL HIGH (ref 70–99)
Potassium: 4.8 mmol/L (ref 3.5–5.1)
Sodium: 136 mmol/L (ref 135–145)
Total Bilirubin: 0.6 mg/dL (ref 0.3–1.2)
Total Protein: 6.5 g/dL (ref 6.5–8.1)

## 2019-06-14 LAB — PHOSPHORUS: Phosphorus: 3.6 mg/dL (ref 2.5–4.6)

## 2019-06-14 LAB — CBC WITH DIFFERENTIAL/PLATELET
Abs Immature Granulocytes: 0.06 10*3/uL (ref 0.00–0.07)
Basophils Absolute: 0 10*3/uL (ref 0.0–0.1)
Basophils Relative: 0 %
Eosinophils Absolute: 0 10*3/uL (ref 0.0–0.5)
Eosinophils Relative: 0 %
HCT: 35.6 % — ABNORMAL LOW (ref 39.0–52.0)
Hemoglobin: 12 g/dL — ABNORMAL LOW (ref 13.0–17.0)
Immature Granulocytes: 1 %
Lymphocytes Relative: 7 %
Lymphs Abs: 0.5 10*3/uL — ABNORMAL LOW (ref 0.7–4.0)
MCH: 33.4 pg (ref 26.0–34.0)
MCHC: 33.7 g/dL (ref 30.0–36.0)
MCV: 99.2 fL (ref 80.0–100.0)
Monocytes Absolute: 0.2 10*3/uL (ref 0.1–1.0)
Monocytes Relative: 2 %
Neutro Abs: 6 10*3/uL (ref 1.7–7.7)
Neutrophils Relative %: 90 %
Platelets: 133 10*3/uL — ABNORMAL LOW (ref 150–400)
RBC: 3.59 MIL/uL — ABNORMAL LOW (ref 4.22–5.81)
RDW: 13.5 % (ref 11.5–15.5)
WBC: 6.8 10*3/uL (ref 4.0–10.5)
nRBC: 0 % (ref 0.0–0.2)

## 2019-06-14 LAB — GLUCOSE, CAPILLARY
Glucose-Capillary: 155 mg/dL — ABNORMAL HIGH (ref 70–99)
Glucose-Capillary: 176 mg/dL — ABNORMAL HIGH (ref 70–99)
Glucose-Capillary: 246 mg/dL — ABNORMAL HIGH (ref 70–99)
Glucose-Capillary: 257 mg/dL — ABNORMAL HIGH (ref 70–99)
Glucose-Capillary: 276 mg/dL — ABNORMAL HIGH (ref 70–99)

## 2019-06-14 LAB — C-REACTIVE PROTEIN: CRP: 4.2 mg/dL — ABNORMAL HIGH (ref <1.0)

## 2019-06-14 LAB — LIPID PANEL
Cholesterol: 115 mg/dL (ref 0–200)
HDL: 35 mg/dL — ABNORMAL LOW (ref >40)
LDL Cholesterol: 67 mg/dL (ref 0–99)
Total CHOL/HDL Ratio: 3.3 RATIO
Triglycerides: 67 mg/dL (ref <150)
VLDL: 13 mg/dL (ref 0–40)

## 2019-06-14 LAB — PROCALCITONIN: Procalcitonin: <0.1 ng/mL

## 2019-06-14 LAB — D-DIMER, QUANTITATIVE: D-Dimer, Quant: 4.45 ug/mL-FEU — ABNORMAL HIGH (ref 0.00–0.50)

## 2019-06-14 LAB — MAGNESIUM: Magnesium: 1.8 mg/dL (ref 1.7–2.4)

## 2019-06-14 MED ORDER — INSULIN DETEMIR 100 UNIT/ML ~~LOC~~ SOLN
5.0000 [IU] | Freq: Once | SUBCUTANEOUS | Status: AC
  Administered 2019-06-14: 5 [IU] via SUBCUTANEOUS
  Filled 2019-06-14: qty 0.05

## 2019-06-14 MED ORDER — INSULIN DETEMIR 100 UNIT/ML ~~LOC~~ SOLN
20.0000 [IU] | Freq: Two times a day (BID) | SUBCUTANEOUS | Status: DC
  Administered 2019-06-14 – 2019-06-16 (×3): 20 [IU] via SUBCUTANEOUS
  Filled 2019-06-14 (×5): qty 0.2

## 2019-06-14 MED ORDER — INSULIN ASPART 100 UNIT/ML ~~LOC~~ SOLN
10.0000 [IU] | Freq: Three times a day (TID) | SUBCUTANEOUS | Status: DC
  Administered 2019-06-14 – 2019-06-16 (×5): 10 [IU] via SUBCUTANEOUS

## 2019-06-14 NOTE — Progress Notes (Signed)
Wife called and updated on patient condition. Wife has been on phone with patient and updated while RN was in the room as well for past 2 days. States MD calls every afternoon as well. No concerns or complaints verbalized.

## 2019-06-14 NOTE — Progress Notes (Addendum)
PROGRESS NOTE    [name]  [id number] DOB: 16-May-1945 DOA: 06/11/2019 PCP: Cyndi Bender, PA-C   Brief Narrative:  74 y.o. WM PMHx  diabetes mellitus type 2, chronic kidney disease stage III, CAD hypertension atrial fibrillation on Xarelto s/p Colectomy for Colon cancer   Presents with shortness of breath, he went to his PCP and was found to be satting 85% on room air he said is gotten progressively worse over the last 5 days   Subjective: 2/4 afebrile overnight, A/O x4, positive S OB but feels as if his WOB has improved.   Assessment & Plan:   Active Problems:   Hyperlipidemia   HYPERTENSION, BENIGN   CAD, NATIVE VESSEL   Uncontrolled type 2 diabetes mellitus with circulatory disorder   A-fib (HCC)   Chronic anticoagulation   Suspected COVID-19 virus infection   COVID-19 virus infection   Pneumonia due to COVID-19 virus   Acute respiratory failure with hypoxia (HCC)   AKI (acute kidney injury) ([place])   Covid pneumonia/acute respiratory failure with hypoxia COVID-19 Labs  Recent Labs    06/11/19 2050 06/12/19 0442 06/13/19 0259  DDIMER 2.55* 2.61* 2.67*  FERRITIN 945* 963* 961*  LDH 255*  --   --   CRP 10.0* 9.3* 6.3*    Lab Results  Component Value Date   SARSCOV2NAA POSITIVE (A) 06/11/2019   -Decadron 6 mg daily -Remdesivir per pharmacy protocol -2/2 transfuse 1 unit Covid convalescent plasma -Vitamins per pharmacy protocol -Flutter valve -Incentive spirometry -Combivent -Titrate O2 to maintain SPO2> 88%  A. fib with RVR -Currently NSR -Metoprolol 12.5 mg BID -Metoprolol PRN -Continue Xarelto  Essential HTN -See A. fib RVR  CAD of native vessels  -Lipitor 20 mg daily -2/4 LDL = 67  Uncontrolled diabetes type 2 with complication -2/2 hemoglobin A1c= 7.3 -2/4 increase Levemir 20 units BID -2/4 increase NovoLog 10 units qac  -Moderate SSI  Acute kidney injury (baseline Cr 1.3) -Hold ACE inhibitor -Normal saline 34ml/hr Recent  Labs  Lab 06/11/19 2050 06/13/19 0259  CREATININE 1.68* 1.57*  -Improving with mild hydration    DVT prophylaxis: Xarelto Code Status: Full Family Communication: 2/4 spoke [name] (wife) counseled on plan of care and answered all questions     Disposition Plan:    Consultants:    Procedures/Significant Events:     I have personally reviewed and interpreted all radiology studies and my findings are as above.  VENTILATOR SETTINGS: HFNC 2/4 Flow; 6 L/min SPO2; 95%    Cultures 2/1 respiratory virus panel positive SARS coronavirus 2 by PCR 2/2 transfuse 1 unit Covid convalescent plasma    Antimicrobials: Anti-infectives (From admission, onward)   Start     Dose/Rate Stop   06/13/19 1000  remdesivir 100 mg in sodium chloride 0.9 % 100 mL IVPB  Status:  Discontinued     100 mg 200 mL/hr over 30 Minutes 06/12/19 0200   06/13/19 1000  remdesivir 100 mg in sodium chloride 0.9 % 100 mL IVPB     100 mg 200 mL/hr over 30 Minutes 06/17/19 0959   06/12/19 0200  remdesivir 200 mg in sodium chloride 0.9% 250 mL IVPB  Status:  Discontinued     200 mg 580 mL/hr over 30 Minutes 06/12/19 0200   06/11/19 2359  remdesivir 200 mg in sodium chloride 0.9% 250 mL IVPB     200 mg 580 mL/hr over 30 Minutes 06/12/19 0137       Devices    LINES / TUBES:  Continuous Infusions: . sodium chloride 50 mL/hr at 06/13/19 1110  . famotidine (PEPCID) IV 20 mg (06/13/19 2156)  . remdesivir 100 mg in NS 100 mL Stopped (06/13/19 1200)     Objective: Vitals:   06/13/19 1900 06/14/19 0000 06/14/19 0400 06/14/19 0748  BP: 126/71 124/75 121/82 (!) 131/92  Pulse: 94     Resp:  18  16  Temp: 98.7 F (37.1 C) 98.6 F (37 C) 98.4 F (36.9 C) (!) 97.4 F (36.3 C)  TempSrc: Oral Oral  Oral  SpO2:      Weight:      Height:        Intake/Output Summary (Last 24 hours) at 06/14/2019 [id number] Last data filed at 06/14/2019 0000 Gross per 24 hour  Intake 861.88 ml  Output 800 ml  Net  61.88 ml   Filed Weights   06/11/19 1940  Weight: 108.9 kg   Physical Exam:  General: A/O x4, positive acute respiratory distress Eyes: negative scleral hemorrhage, negative anisocoria, negative icterus ENT: Negative Runny nose, negative gingival bleeding, Neck:  Negative scars, masses, torticollis, lymphadenopathy, JVD Lungs: Clear to auscultation bilaterally without wheezes or crackles Cardiovascular: Regular rate and rhythm without murmur gallop or rub normal S1 and S2 Abdomen: negative abdominal pain, nondistended, positive soft, bowel sounds, no rebound, no ascites, no appreciable mass Extremities: No significant cyanosis, clubbing, or edema bilateral lower extremities Skin: Negative rashes, lesions, ulcers Psychiatric:  Negative depression, negative anxiety, negative fatigue, negative mania  Central nervous system:  Cranial nerves II through XII intact, tongue/uvula midline, all extremities muscle strength 5/5, sensation intact throughout, negative dysarthria, negative expressive aphasia, negative receptive aphasia.   .     Data Reviewed: Care during the described time interval was provided by me .  I have reviewed this patient's available data, including medical history, events of note, physical examination, and all test results as part of my evaluation.   CBC: Recent Labs  Lab 06/11/19 2050 06/12/19 0442 06/13/19 0259  WBC 4.1 2.1* 6.5  NEUTROABS 3.5 1.6* 5.7  HGB 12.6* 12.1* 11.1*  HCT 37.1* 34.8* 33.4*  MCV 97.6 97.2 99.4  PLT PLATELET CLUMPS NOTED ON SMEAR, UNABLE TO ESTIMATE PLATELET CLUMPS NOTED ON SMEAR, COUNT APPEARS ADEQUATE PLATELET CLUMPS NOTED ON SMEAR, COUNT APPEARS ADEQUATE   Basic Metabolic Panel: Recent Labs  Lab 06/11/19 2050 06/12/19 0442 06/13/19 0259  NA 130*  --  134*  K 4.1  --  4.5  CL 99  --  105  CO2 20*  --  20*  GLUCOSE 247*  --  259*  BUN 27*  --  35*  CREATININE 1.68*  --  1.57*  CALCIUM 8.3*  --  8.2*  MG  --  1.9 1.9  PHOS   --   --  2.3*   GFR: Estimated Creatinine Clearance: 55.9 mL/min (A) (by C-G formula based on SCr of 1.57 mg/dL (H)). Liver Function Tests: Recent Labs  Lab 06/11/19 2050 06/13/19 0259  AST 51* 44*  ALT 33 37  ALKPHOS 40 54  BILITOT 0.7 0.2*  PROT 7.0 6.1*  ALBUMIN 3.2* 2.8*   No results for input(s): LIPASE, AMYLASE in the last 168 hours. No results for input(s): AMMONIA in the last 168 hours. Coagulation Profile: Recent Labs  Lab 06/11/19 2050  INR 1.2   Cardiac Enzymes: No results for input(s): CKTOTAL, CKMB, CKMBINDEX, TROPONINI in the last 168 hours. BNP (last 3 results) No results for input(s): PROBNP in the last 8760 hours. HbA1C:  Recent Labs    06/12/19 0442  HGBA1C 7.3*   CBG: Recent Labs  Lab 06/13/19 1637 06/13/19 2005 06/14/19 0014 06/14/19 0521 06/14/19 0746  GLUCAP 292* 193* 176* 246* 276*   Lipid Profile: Recent Labs    06/11/19 2050  TRIG 87   Thyroid Function Tests: No results for input(s): TSH, T4TOTAL, FREET4, T3FREE, THYROIDAB in the last 72 hours. Anemia Panel: Recent Labs    06/12/19 0442 06/13/19 0259  FERRITIN 963* 961*   Urine analysis:    Component Value Date/Time   COLORURINE YELLOW 06/11/2019 2050   APPEARANCEUR CLEAR 06/11/2019 2050   LABSPEC 1.027 06/11/2019 2050   PHURINE 5.0 06/11/2019 2050   GLUCOSEU >=500 (A) 06/11/2019 2050   HGBUR MODERATE (A) 06/11/2019 2050   [place] NEGATIVE 06/11/2019 2050   KETONESUR 5 (A) 06/11/2019 2050   PROTEINUR 100 (A) 06/11/2019 2050   NITRITE NEGATIVE 06/11/2019 2050   LEUKOCYTESUR NEGATIVE 06/11/2019 2050   Sepsis Labs: @LABRCNTIP (procalcitonin:4,lacticidven:4)  ) Recent Results (from the past 240 hour(s))  Culture, blood (Routine x 2)     Status: None (Preliminary result)   Collection Time: 06/11/19  7:49 PM   Specimen: BLOOD  Result Value Ref Range Status   Specimen Description   Final    BLOOD BLOOD RIGHT FOREARM Performed at [facility],  [place] [address] [place], [place] [address]    Special Requests   Final    BOTTLES DRAWN AEROBIC AND ANAEROBIC Blood Culture adequate volume Performed at [facility] [address] [place], [place] [address]    Culture   Final    NO GROWTH 1 DAY Performed at [facility], [place] [address], [place], [place] [address]    Report Status PENDING  Incomplete  Culture, blood (Routine x 2)     Status: None (Preliminary result)   Collection Time: 06/11/19  8:50 PM   Specimen: BLOOD  Result Value Ref Range Status   Specimen Description   Final    BLOOD RIGHT ANTECUBITAL Performed at [facility] [address], [place], [place] [address]    Special Requests   Final    BOTTLES DRAWN AEROBIC AND ANAEROBIC Blood Culture results may not be optimal due to an excessive volume of blood received in culture bottles Performed at [facility] [address], [place], [place] [address]    Culture   Final    NO GROWTH 1 DAY Performed at [facility], [place] [address] [place], [place] [address]    Report Status PENDING  Incomplete  Respiratory Panel by RT PCR (Flu A&B, Covid) - Nasopharyngeal Swab     Status: Abnormal   Collection Time: 06/11/19  8:50 PM   Specimen: Nasopharyngeal Swab  Result Value Ref Range Status   SARS Coronavirus 2 by RT PCR POSITIVE (A) NEGATIVE Final    Comment: RESULT CALLED TO, READ BACK BY AND VERIFIED WITH: BULLOCK,A @ 2351 ON [id number] BY POTEAT,S    Influenza A by PCR NEGATIVE NEGATIVE Final   Influenza B by PCR NEGATIVE NEGATIVE Final    Comment: Performed at [facility], [place] [address] [place], [place] [address]  Culture, sputum-assessment     Status: None   Collection Time: 06/12/19  3:49 AM   Specimen: Sputum  Result Value Ref Range Status   Specimen Description SPU  Final   Special Requests NONE  Final   Sputum evaluation   Final  THIS SPECIMEN IS ACCEPTABLE FOR SPUTUM  CULTURE Performed at [facility], [place] [address] [place], [place] [address]    Report Status 06/12/2019 FINAL  Final  Culture, respiratory     Status: None (Preliminary result)   Collection Time: 06/12/19  3:49 AM   Specimen: Sputum  Result Value Ref Range Status   Specimen Description   Final    SPU Performed at [facility] [address], [place], [place] [address]    Special Requests   Final    NONE Reflexed from [phone] Performed at [facility] [address] [place], [place] [address]    Gram Stain   Final    RARE WBC PRESENT, PREDOMINANTLY PMN MODERATE SQUAMOUS EPITHELIAL CELLS PRESENT ABUNDANT GRAM POSITIVE COCCI MODERATE YEAST RARE GRAM POSITIVE RODS    Culture   Final    CULTURE REINCUBATED FOR BETTER GROWTH Performed at [facility], [address] [place], [place] [address]    Report Status PENDING  Incomplete         Radiology Studies: DG Chest Port 1 View  Result Date: 06/13/2019 CLINICAL DATA:  PICC line placement EXAM: PORTABLE CHEST 1 VIEW COMPARISON:  06/11/2019 FINDINGS: Right upper extremity PICC line observed, the distal tip is somewhat indistinct but believed to be in the SVC. No pneumothorax. Continued patchy airspace opacities in both lungs, not appreciably changed. Low lung volumes with mildly elevated right hemidiaphragm. Atherosclerotic calcification of the aortic arch. Borderline cardiomegaly. IMPRESSION: 1. Right upper extremity PICC line tip: SVC. No pneumothorax. 2. Stable patchy airspace opacities in both lungs. Electronically Signed   By: Van Clines M.D.   On: 06/13/2019 18:16   [place] EKG SITE RITE  Result Date: 06/13/2019 If [facility] image not attached, placement could not be confirmed due to current cardiac rhythm.       Scheduled Meds: . vitamin C  500 mg Oral Daily  . atorvastatin  20 mg Oral QHS  . Chlorhexidine Gluconate Cloth  6 each Topical Daily  .  dexamethasone (DECADRON) injection  6 mg Intravenous Q24H  . influenza vaccine adjuvanted  0.5 mL Intramuscular Tomorrow-1000  . insulin aspart  0-15 Units Subcutaneous TID WC  . insulin aspart  0-5 Units Subcutaneous QHS  . insulin aspart  6 Units Subcutaneous TID WC  . insulin detemir  15 Units Subcutaneous BID  . Ipratropium-Albuterol  1 puff Inhalation QID  . linagliptin  5 mg Oral Daily  . metoprolol tartrate  12.5 mg Oral BID  . pneumococcal 23 valent vaccine  0.5 mL Intramuscular Tomorrow-1000  . rivaroxaban  20 mg Oral Q supper  . sodium chloride flush  10-40 mL Intracatheter Q12H  . sodium chloride flush  3 mL Intravenous Q12H  . zinc sulfate  220 mg Oral Daily   Continuous Infusions: . sodium chloride 50 mL/hr at 06/13/19 1110  . famotidine (PEPCID) IV 20 mg (06/13/19 2156)  . remdesivir 100 mg in NS 100 mL Stopped (06/13/19 1200)     LOS: 3 days   The patient is critically ill with multiple organ systems failure and requires high complexity decision making for assessment and support, frequent evaluation and titration of therapies, application of advanced monitoring technologies and extensive interpretation of multiple databases. Critical Care Time devoted to patient care services described in this note  Time spent: 40 minutes     Savier Trickett, Geraldo Docker, MD [facility] Pager [phone]  If 7PM-7AM, please contact night-coverage [email or web address]  Password [email or web address] 06/14/2019, 9:23 AM

## 2019-06-14 NOTE — Progress Notes (Signed)
Patient has been sitting up on side of the bed for most of the day on 5LHFNC with sats 96-98%. RN offered to assist with bathing multiple times but insisted on performing himself. O2 increased to 6L HFNC after ambulation with PT. Patient is very [place] even with hearing aids on but able to communicate effectively and demonstrate understanding of education provided. Patient O2 sats on 6L HFNC in 90s and denies increased SOB. Will continue to monitor.

## 2019-06-14 NOTE — Progress Notes (Signed)
Physical Therapy Treatment Patient Details Name: [name] MRN: [id number] DOB: June 18, 1945 Today's Date: 06/14/2019    History of Present Illness 74 y.o. male with medical history significant of  DM2 insulin-dependent CKD stage III CAD, hypertension, atrial fibrillation on Xarelto, obesity, Colon Ca sp Colectomy and now sp colostomy take down. COVID + from ER 2/1.    PT Comments    Pt still quite impulsive with mobility needing continued cues and education for safety. He also tends to desat and not notice therapist has also educated on this extensively, with poor carryover. Pt was able to ambulate approx 230ft with min assist today, gait was quite unsteady when asked about this pt gives long story about when he had a back injury and went to therapy. Pt was on 4L/min at rest and sats in high 80s, with ambulation on 6L/min desat to min of 81% Pt has been instructed on use of flutter valve and incentive spirometer during down times.     Follow Up Recommendations  Home health PT     Equipment Recommendations  None recommended by PT    Recommendations for Other Services OT consult     Precautions / Restrictions Precautions Precautions: Fall Precaution Comments: extemely HOH Restrictions Weight Bearing Restrictions: No    Mobility  Bed Mobility Overal bed mobility: Modified Independent                Transfers Overall transfer level: Needs assistance Equipment used: None Transfers: Sit to/from Stand Sit to Stand: Supervision            Ambulation/Gait Ambulation/Gait assistance: Min assist Gait Distance (Feet): 250 Feet Assistive device: None Gait Pattern/deviations: Wide base of support;Staggering left;Staggering right;Ataxic Gait velocity: decreased   General Gait Details: pt was staggering with ambulation also noted some proprioceptive gait   Stairs             Wheelchair Mobility    Modified Rankin (Stroke Patients Only)       Balance  Overall balance assessment: Needs assistance Sitting-balance support: Feet supported Sitting balance-Leahy Scale: Good Sitting balance - Comments: sits unsupported and completes UE activities   Standing balance support: During functional activity Standing balance-Leahy Scale: Fair                              Cognition Arousal/Alertness: Awake/alert Behavior During Therapy: Impulsive Overall Cognitive Status: Within Functional Limits for tasks assessed                                        Exercises Other Exercises Other Exercises: incentive spirometer x 10  Other Exercises: flutter valve x 10    General Comments        Pertinent Vitals/Pain Pain Assessment: No/denies pain    Home Living                      Prior Function            PT Goals (current goals can now be found in the care plan section) Acute Rehab PT Goals Patient Stated Goal: to go home PT Goal Formulation: With patient Time For Goal Achievement: 06/26/19 Potential to Achieve Goals: Good Progress towards PT goals: Progressing toward goals    Frequency    Min 3X/week      PT Plan Current plan remains  appropriate    Co-evaluation              AM-PAC PT "6 Clicks" Mobility   Outcome Measure  Help needed turning from your back to your side while in a flat bed without using bedrails?: None Help needed moving from lying on your back to sitting on the side of a flat bed without using bedrails?: None Help needed moving to and from a bed to a chair (including a wheelchair)?: A Little Help needed standing up from a chair using your arms (e.g., wheelchair or bedside chair)?: A Little Help needed to walk in hospital room?: A Little Help needed climbing 3-5 steps with a railing? : A Lot 6 Click Score: 19    End of Session Equipment Utilized During Treatment: Oxygen;Gait belt Activity Tolerance: Patient tolerated treatment well;Patient limited by  fatigue Patient left: in bed;with call bell/phone within reach Nurse Communication: Mobility status PT Visit Diagnosis: Other abnormalities of gait and mobility (R26.89);Muscle weakness (generalized) (M62.81)     Time: [id number] PT Time Calculation (min) (ACUTE ONLY): 33 min  Charges:  $Gait Training: 8-22 mins $Therapeutic Activity: 8-22 mins                     Horald Chestnut, PT    Delford Field 06/14/2019, 5:00 PM

## 2019-06-15 LAB — GLUCOSE, CAPILLARY
Glucose-Capillary: 103 mg/dL — ABNORMAL HIGH (ref 70–99)
Glucose-Capillary: 118 mg/dL — ABNORMAL HIGH (ref 70–99)
Glucose-Capillary: 157 mg/dL — ABNORMAL HIGH (ref 70–99)
Glucose-Capillary: 170 mg/dL — ABNORMAL HIGH (ref 70–99)
Glucose-Capillary: 173 mg/dL — ABNORMAL HIGH (ref 70–99)
Glucose-Capillary: 194 mg/dL — ABNORMAL HIGH (ref 70–99)
Glucose-Capillary: 55 mg/dL — ABNORMAL LOW (ref 70–99)
Glucose-Capillary: 60 mg/dL — ABNORMAL LOW (ref 70–99)
Glucose-Capillary: 67 mg/dL — ABNORMAL LOW (ref 70–99)
Glucose-Capillary: 73 mg/dL (ref 70–99)
Glucose-Capillary: 76 mg/dL (ref 70–99)
Glucose-Capillary: 88 mg/dL (ref 70–99)

## 2019-06-15 LAB — CBC WITH DIFFERENTIAL/PLATELET
Abs Immature Granulocytes: 0.09 10*3/uL — ABNORMAL HIGH (ref 0.00–0.07)
Basophils Absolute: 0 10*3/uL (ref 0.0–0.1)
Basophils Relative: 0 %
Eosinophils Absolute: 0 10*3/uL (ref 0.0–0.5)
Eosinophils Relative: 0 %
HCT: 33.9 % — ABNORMAL LOW (ref 39.0–52.0)
Hemoglobin: 11.5 g/dL — ABNORMAL LOW (ref 13.0–17.0)
Immature Granulocytes: 1 %
Lymphocytes Relative: 11 %
Lymphs Abs: 0.9 10*3/uL (ref 0.7–4.0)
MCH: 33.3 pg (ref 26.0–34.0)
MCHC: 33.9 g/dL (ref 30.0–36.0)
MCV: 98.3 fL (ref 80.0–100.0)
Monocytes Absolute: 0.2 10*3/uL (ref 0.1–1.0)
Monocytes Relative: 2 %
Neutro Abs: 6.7 10*3/uL (ref 1.7–7.7)
Neutrophils Relative %: 86 %
Platelets: 134 10*3/uL — ABNORMAL LOW (ref 150–400)
RBC: 3.45 MIL/uL — ABNORMAL LOW (ref 4.22–5.81)
RDW: 13.4 % (ref 11.5–15.5)
WBC: 7.9 10*3/uL (ref 4.0–10.5)
nRBC: 0 % (ref 0.0–0.2)

## 2019-06-15 LAB — COMPREHENSIVE METABOLIC PANEL
ALT: 42 U/L (ref 0–44)
AST: 49 U/L — ABNORMAL HIGH (ref 15–41)
Albumin: 2.9 g/dL — ABNORMAL LOW (ref 3.5–5.0)
Alkaline Phosphatase: 46 U/L (ref 38–126)
Anion gap: 8 (ref 5–15)
BUN: 31 mg/dL — ABNORMAL HIGH (ref 8–23)
CO2: 23 mmol/L (ref 22–32)
Calcium: 8.5 mg/dL — ABNORMAL LOW (ref 8.9–10.3)
Chloride: 104 mmol/L (ref 98–111)
Creatinine, Ser: 1.33 mg/dL — ABNORMAL HIGH (ref 0.61–1.24)
GFR calc Af Amer: >60 mL/min (ref >60)
GFR calc non Af Amer: 53 mL/min — ABNORMAL LOW (ref >60)
Glucose, Bld: 90 mg/dL (ref 70–99)
Potassium: 3.9 mmol/L (ref 3.5–5.1)
Sodium: 135 mmol/L (ref 135–145)
Total Bilirubin: 0.8 mg/dL (ref 0.3–1.2)
Total Protein: 6.1 g/dL — ABNORMAL LOW (ref 6.5–8.1)

## 2019-06-15 LAB — D-DIMER, QUANTITATIVE: D-Dimer, Quant: 5.83 ug/mL-FEU — ABNORMAL HIGH (ref 0.00–0.50)

## 2019-06-15 LAB — MAGNESIUM: Magnesium: 1.6 mg/dL — ABNORMAL LOW (ref 1.7–2.4)

## 2019-06-15 LAB — C-REACTIVE PROTEIN: CRP: 2.8 mg/dL — ABNORMAL HIGH (ref <1.0)

## 2019-06-15 LAB — CULTURE, RESPIRATORY W GRAM STAIN: Culture: NORMAL

## 2019-06-15 LAB — FERRITIN: Ferritin: 789 ng/mL — ABNORMAL HIGH (ref 24–336)

## 2019-06-15 LAB — PHOSPHORUS: Phosphorus: 3 mg/dL (ref 2.5–4.6)

## 2019-06-15 MED ORDER — INSULIN ASPART 100 UNIT/ML ~~LOC~~ SOLN
0.0000 [IU] | SUBCUTANEOUS | Status: DC
  Administered 2019-06-15: 2 [IU] via SUBCUTANEOUS
  Administered 2019-06-16: 9 [IU] via SUBCUTANEOUS
  Administered 2019-06-16 (×2): 3 [IU] via SUBCUTANEOUS

## 2019-06-15 MED ORDER — DEXTROSE 50 % IV SOLN
INTRAVENOUS | Status: AC
  Filled 2019-06-15: qty 50

## 2019-06-15 MED ORDER — FAMOTIDINE 20 MG PO TABS
20.0000 mg | ORAL_TABLET | Freq: Two times a day (BID) | ORAL | Status: DC
  Administered 2019-06-15 – 2019-06-16 (×2): 20 mg via ORAL
  Filled 2019-06-15 (×2): qty 1

## 2019-06-15 MED ORDER — MAGNESIUM SULFATE 2 GM/50ML IV SOLN
2.0000 g | Freq: Once | INTRAVENOUS | Status: AC
  Administered 2019-06-15: 2 g via INTRAVENOUS
  Filled 2019-06-15: qty 50

## 2019-06-15 NOTE — Progress Notes (Signed)
SATURATION QUALIFICATIONS: (This note is used to comply with regulatory documentation for home oxygen)  Patient Saturations on Room Air at Rest = 86%  Patient Saturations on 6 Liters of oxygen while Ambulating = 81-90%  Please briefly explain why patient needs home oxygen:  Pt O2 sats less than 88% on RA at rest. Pt has been on 4-6L HFNC during the day. Pt ambulated on 6L and sats ranged from 81-90%. Pt now sitting in chair on 4L, sats in the low 90's.

## 2019-06-15 NOTE — Progress Notes (Signed)
Wife updated

## 2019-06-15 NOTE — Progress Notes (Signed)
Occupational Therapy Evaluation Patient Details Name: [name] MRN: [id number] DOB: [birth year] Today's Date: 06/15/2019    History of Present Illness 74 y.o. male with medical history significant of  DM2 insulin-dependent CKD stage III CAD, hypertension, atrial fibrillation on Xarelto, obesity, Colon Ca sp Colectomy and now sp colostomy take down. COVID + from ER 2/1.   Clinical Impression   PTA, pt lived at home with his wife and was independent with ADL and mobility. Pt currently able to complete functional mobility and ADL with min A @ RW level on 6L with SpO2 desat to mid 80s. 2/4 DOE. Quick rebound to 90s using ear lobe probe. Nsg weaned pt to 4L while in room. Will follow acutely and recommend follow up [place].     Follow Up Recommendations  Home health OT;Supervision/Assistance - 24 hour    Equipment Recommendations  3 in 1 bedside commode    Recommendations for Other Services       Precautions / Restrictions Precautions Precautions: Fall Precaution Comments: extemely HOH Restrictions Weight Bearing Restrictions: No      Mobility Bed Mobility               General bed mobility comments: OOB in chair  Transfers Overall transfer level: Needs assistance Equipment used: Rolling walker (2 wheeled) Transfers: Sit to/from Stand Sit to Stand: Min guard Stand pivot transfers: Min guard            Balance     Sitting balance-Leahy Scale: Good       Standing balance-Leahy Scale: Fair                             ADL either performed or assessed with clinical judgement   ADL Overall ADL's : Needs assistance/impaired Eating/Feeding: Modified independent   Grooming: Standing;Min guard   Upper Body Bathing: Set up;Sitting   Lower Body Bathing: Minimal assistance;Sit to/from stand   Upper Body Dressing : Set up;Sitting   Lower Body Dressing: Minimal assistance;Sit to/from stand   Toilet Transfer: Minimal assistance;Ambulation(steady  assist) Toilet Transfer Details (indicate cue type and reason): vc for safety/hand placement Toileting- Clothing Manipulation and Hygiene: Set up;Supervision/safety;Sit to/from stand       Functional mobility during ADLs: Minimal assistance;Rolling walker;Cueing for safety(cues for hand placement and safe use of RW)       Vision         Perception     Praxis      Pertinent Vitals/Pain Pain Assessment: Faces Faces Pain Scale: No hurt     Hand Dominance Right   Extremity/Trunk Assessment Upper Extremity Assessment Upper Extremity Assessment: Generalized weakness   Lower Extremity Assessment Lower Extremity Assessment: Defer to PT evaluation   Cervical / Trunk Assessment Cervical / Trunk Assessment: Kyphotic   Communication Communication Communication: HOH   Cognition Arousal/Alertness: Awake/alert Behavior During Therapy: WFL for tasks assessed/performed Overall Cognitive Status: No family/caregiver present to determine baseline cognitive functioning                                 General Comments: conversation was appropriate; hearing loss most likely impacts how pt is resonding   General Comments       Exercises Other Exercises Other Exercises: incentive spirometer x 10  Other Exercises: flutter valve x 10   Shoulder Instructions      Home Living Family/patient expects to be discharged  to:: Private residence Living Arrangements: Spouse/significant other Available Help at Discharge: Family Type of Home: House Home Access: Stairs to enter Technical brewer of Steps: 2-3   [place]: One level     Bathroom Shower/Tub: Occupational psychologist: Handicapped height Bathroom Accessibility: Yes How Accessible: Accessible via walker Home Equipment: [place] - 2 wheels;Grab bars - toilet;Shower seat - built in          Prior Functioning/Environment Level of Independence: Independent        Comments: states that  ambulated 3miles a day prior to illness        OT Problem List: Decreased strength;Decreased activity tolerance;Impaired balance (sitting and/or standing);Decreased safety awareness;Decreased knowledge of use of DME or AE;Cardiopulmonary status limiting activity      OT Treatment/Interventions: Self-care/ADL training;Therapeutic exercise;Neuromuscular education;Energy conservation;DME and/or AE instruction;Therapeutic activities;Patient/family education;Balance training    OT Goals(Current goals can be found in the care plan section) Acute Rehab OT Goals Patient Stated Goal: to go home OT Goal Formulation: With patient Time For Goal Achievement: 06/29/19 Potential to Achieve Goals: Good  OT Frequency: Min 3X/week   Barriers to D/C:            Co-evaluation              AM-PAC OT "6 Clicks" Daily Activity     Outcome Measure Help from another person eating meals?: None Help from another person taking care of personal grooming?: A Little Help from another person toileting, which includes using toliet, bedpan, or urinal?: A Little Help from another person bathing (including washing, rinsing, drying)?: A Little Help from another person to put on and taking off regular upper body clothing?: A Little Help from another person to put on and taking off regular lower body clothing?: A Little 6 Click Score: 19   End of Session Equipment Utilized During Treatment: Rolling walker;Gait belt;Oxygen(6L) Nurse Communication: Mobility status;Other (comment)(O2 needs)  Activity Tolerance: Patient tolerated treatment well Patient left: in chair;with call bell/phone within reach;with chair alarm set  OT Visit Diagnosis: Unsteadiness on feet (R26.81);Muscle weakness (generalized) (M62.81)                Time: 1440-1520 OT Time Calculation (min): 40 min Charges:  OT General Charges $OT Visit: 1 Visit OT Evaluation $OT Eval Moderate Complexity: 1 Mod OT Treatments $Self Care/Home  Management : 23-37 mins  Maurie Boettcher, OT/L   Acute OT Clinical Specialist [place] Pager [phone] Office [phone]   [facility] 06/15/2019, 5:16 PM

## 2019-06-15 NOTE — Progress Notes (Signed)
Ambulation Note  Saturation Pre: 94% on 6L HFNC  Ambulation Distance: 150 ft  Saturation During Ambulation: 81-90% on 6 L HFNC  Notes: Pt walked independently. I continuously cued patient to PLB to no avail. Pt's gait was erratic at times. Pt returned to recliner with call bell within reach, SpO2 90% on 6 L HFNC.   Philis Kendall, MS, ACSM CEP 11:43 AM 06/15/2019

## 2019-06-15 NOTE — Progress Notes (Addendum)
PROGRESS NOTE    [name]  [id number] DOB: Apr 27, 1946 DOA: 06/11/2019 PCP: Richard Bender, PA-C   Brief Narrative:  74 y.o. WM PMHx  diabetes mellitus type 2, chronic kidney disease stage III, CAD hypertension atrial fibrillation on Xarelto s/p Colectomy for Colon cancer   Presents with shortness of breath, he went to his PCP and was found to be satting 85% on room air he said is gotten progressively worse over the last 5 days   Subjective: 2/5 afebrile last 24 hours A/O x4, positive S OB   Assessment & Plan:   Active Problems:   Hyperlipidemia   HYPERTENSION, BENIGN   CAD, NATIVE VESSEL   Uncontrolled type 2 diabetes mellitus with circulatory disorder   A-fib (HCC)   Chronic anticoagulation   Suspected COVID-19 virus infection   COVID-19 virus infection   Pneumonia due to COVID-19 virus   Acute respiratory failure with hypoxia (HCC)   AKI (acute kidney injury) ([place])   Covid pneumonia/acute respiratory failure with hypoxia COVID-19 Labs  Recent Labs    06/13/19 0259 06/14/19 0810 06/15/19 0525  DDIMER 2.67* 4.45* 5.83*  FERRITIN 961* 813* 789*  CRP 6.3* 4.2* 2.8*    Lab Results  Component Value Date   SARSCOV2NAA POSITIVE (A) 06/11/2019   -Decadron 6 mg daily -Remdesivir per pharmacy protocol (end date 2/6) -2/2 transfuse 1 unit Covid convalescent plasma -Vitamins per pharmacy protocol -Flutter valve -Incentive spirometry -Combivent -Titrate O2 to maintain SPO2> 88% SATURATION QUALIFICATIONS: (This note is used to comply with regulatory documentation for home oxygen) Patient Saturations on Room Air at Rest = 86% Patient Saturations on 6 Liters of oxygen while Ambulating = 81-90% Please briefly explain why patient needs home oxygen:  Pt O2 sats less than 88% on RA at rest. Pt has been on 4-6L HFNC during the day. Pt ambulated on 6L and sats ranged from 81-90%. Pt now sitting in chair on 4L, sats in the low 90's.  -Patient meets criteria for home  O2 -6 L O2 via [place] titrate to maintain SPO2> 88% -Provide Inogen home portable O2 concentrator  A. fib with RVR -Currently NSR -Metoprolol 12.5 mg BID -Metoprolol PRN -Continue Xarelto  Essential HTN -See A. fib RVR  CAD of native vessels  -Lipitor 20 mg daily -2/4 LDL = 67  Uncontrolled diabetes type 2 with complication -2/2 hemoglobin A1c= 7.3 -2/4 increase Levemir 20 units BID -2/4 increase NovoLog 10 units qac  -2/5 decrease Sensitive SSI   Acute kidney injury (baseline Cr 1.3) -Hold ACE inhibitor -Normal saline 82ml/hr Recent Labs  Lab 06/11/19 2050 06/13/19 0259 06/14/19 0810 06/15/19 0525  CREATININE 1.68* 1.57* 1.42* 1.33*  -At Baseline  Hypomagnesmia -Magnesium goal> 2 -Magnesium IV 2 g  Goals of care -2/5 PT/OT consult; PT recommends home health -2/5 face-to-face placed on    DVT prophylaxis: Xarelto Code Status: Full Family Communication: 2/4 spoke [name] (wife) counseled on plan of care and answered all questions     Disposition Plan:    Consultants:    Procedures/Significant Events:     I have personally reviewed and interpreted all radiology studies and my findings are as above.  VENTILATOR SETTINGS: HFNC 2/5 Flow; 6 L/min SPO2; 92%    Cultures 2/1 respiratory virus panel positive SARS coronavirus 2 by PCR 2/2 transfuse 1 unit Covid convalescent plasma    Antimicrobials: Anti-infectives (From admission, onward)   Start     Dose/Rate Stop   06/13/19 1000  remdesivir 100 mg in sodium chloride  0.9 % 100 mL IVPB  Status:  Discontinued     100 mg 200 mL/hr over 30 Minutes 06/12/19 0200   06/13/19 1000  remdesivir 100 mg in sodium chloride 0.9 % 100 mL IVPB     100 mg 200 mL/hr over 30 Minutes 06/17/19 0959   06/12/19 0200  remdesivir 200 mg in sodium chloride 0.9% 250 mL IVPB  Status:  Discontinued     200 mg 580 mL/hr over 30 Minutes 06/12/19 0200   06/11/19 2359  remdesivir 200 mg in sodium chloride 0.9% 250 mL IVPB      200 mg 580 mL/hr over 30 Minutes 06/12/19 0137       Devices    LINES / TUBES:      Continuous Infusions: . sodium chloride 50 mL/hr at 06/14/19 1625  . famotidine (PEPCID) IV Stopped (06/15/19 0745)  . remdesivir 100 mg in NS 100 mL Stopped (06/14/19 1110)     Objective: Vitals:   06/14/19 2347 06/14/19 2348 06/15/19 0423 06/15/19 0742  BP:  107/78 119/81 125/88  Pulse:  98 73 94  Resp:  (!) 23 (!) 23 18  Temp: 97.8 F (36.6 C) 97.8 F (36.6 C) 97.6 F (36.4 C) 98.2 F (36.8 C)  TempSrc: Oral Oral Oral Oral  SpO2:  95% 95% 92%  Weight:      Height:        Intake/Output Summary (Last 24 hours) at 06/15/2019 0818 Last data filed at 06/15/2019 0747 Gross per 24 hour  Intake 2044.87 ml  Output 400 ml  Net 1644.87 ml   Filed Weights   06/11/19 1940  Weight: 108.9 kg   Physical Exam:  General: A/O x4, positive acute respiratory distress Eyes: negative scleral hemorrhage, negative anisocoria, negative icterus ENT: Negative Runny nose, negative gingival bleeding, Neck:  Negative scars, masses, torticollis, lymphadenopathy, JVD Lungs: Clear to auscultation bilaterally without wheezes or crackles Cardiovascular: Regular rate and rhythm without murmur gallop or rub normal S1 and S2 Abdomen: negative abdominal pain, nondistended, positive soft, bowel sounds, no rebound, no ascites, no appreciable mass Extremities: No significant cyanosis, clubbing, or edema bilateral lower extremities Skin: Negative rashes, lesions, ulcers Psychiatric:  Negative depression, negative anxiety, negative fatigue, negative mania  Central nervous system:  Cranial nerves II through XII intact, tongue/uvula midline, all extremities muscle strength 5/5, sensation intact throughout, negative dysarthria, negative expressive aphasia, negative receptive aphasia.  .     Data Reviewed: Care during the described time interval was provided by me .  I have reviewed this patient's available data,  including medical history, events of note, physical examination, and all test results as part of my evaluation.   CBC: Recent Labs  Lab 06/11/19 2050 06/12/19 0442 06/13/19 0259 06/14/19 0810 06/15/19 0525  WBC 4.1 2.1* 6.5 6.8 7.9  NEUTROABS 3.5 1.6* 5.7 6.0 6.7  HGB 12.6* 12.1* 11.1* 12.0* 11.5*  HCT 37.1* 34.8* 33.4* 35.6* 33.9*  MCV 97.6 97.2 99.4 99.2 98.3  PLT PLATELET CLUMPS NOTED ON SMEAR, UNABLE TO ESTIMATE PLATELET CLUMPS NOTED ON SMEAR, COUNT APPEARS ADEQUATE PLATELET CLUMPS NOTED ON SMEAR, COUNT APPEARS ADEQUATE 133* [id number]*   Basic Metabolic Panel: Recent Labs  Lab 06/11/19 2050 06/12/19 0442 06/13/19 0259 06/14/19 0810 06/15/19 0525  NA 130*  --  134* 136 135  K 4.1  --  4.5 4.8 3.9  CL 99  --  105 107 104  CO2 20*  --  20* 21* 23  GLUCOSE 247*  --  259* 259* 90  BUN 27*  --  35* 32* 31*  CREATININE 1.68*  --  1.57* 1.42* 1.33*  CALCIUM 8.3*  --  8.2* 8.6* 8.5*  MG  --  1.9 1.9 1.8 1.6*  PHOS  --   --  2.3* 3.6 3.0   GFR: Estimated Creatinine Clearance: 66 mL/min (A) (by C-G formula based on SCr of 1.33 mg/dL (H)). Liver Function Tests: Recent Labs  Lab 06/11/19 2050 06/13/19 0259 06/14/19 0810 06/15/19 0525  AST 51* 44* 46* 49*  ALT 33 37 39 42  ALKPHOS 40 54 53 46  BILITOT 0.7 0.2* 0.6 0.8  PROT 7.0 6.1* 6.5 6.1*  ALBUMIN 3.2* 2.8* 3.0* 2.9*   No results for input(s): LIPASE, AMYLASE in the last 168 hours. No results for input(s): AMMONIA in the last 168 hours. Coagulation Profile: Recent Labs  Lab 06/11/19 2050  INR 1.2   Cardiac Enzymes: No results for input(s): CKTOTAL, CKMB, CKMBINDEX, TROPONINI in the last 168 hours. BNP (last 3 results) No results for input(s): PROBNP in the last 8760 hours. HbA1C: No results for input(s): HGBA1C in the last 72 hours. CBG: Recent Labs  Lab 06/14/19 0746 06/14/19 1156 06/14/19 2011 06/14/19 2344 06/15/19 0424  GLUCAP 276* 257* 155* 118* 73   Lipid Profile: Recent Labs    06/14/19 0810   CHOL 115  HDL 35*  LDLCALC 67  TRIG 67  CHOLHDL 3.3   Thyroid Function Tests: No results for input(s): TSH, T4TOTAL, FREET4, T3FREE, THYROIDAB in the last 72 hours. Anemia Panel: Recent Labs    06/14/19 0810 06/15/19 0525  FERRITIN 813* 789*   Urine analysis:    Component Value Date/Time   COLORURINE YELLOW 06/11/2019 2050   APPEARANCEUR CLEAR 06/11/2019 2050   LABSPEC 1.027 06/11/2019 2050   PHURINE 5.0 06/11/2019 2050   GLUCOSEU >=500 (A) 06/11/2019 2050   HGBUR MODERATE (A) 06/11/2019 2050   [place] NEGATIVE 06/11/2019 2050   KETONESUR 5 (A) 06/11/2019 2050   PROTEINUR 100 (A) 06/11/2019 2050   NITRITE NEGATIVE 06/11/2019 2050   LEUKOCYTESUR NEGATIVE 06/11/2019 2050   Sepsis Labs: @LABRCNTIP (procalcitonin:4,lacticidven:4)  ) Recent Results (from the past 240 hour(s))  Culture, blood (Routine x 2)     Status: None (Preliminary result)   Collection Time: 06/11/19  7:49 PM   Specimen: BLOOD  Result Value Ref Range Status   Specimen Description   Final    BLOOD BLOOD RIGHT FOREARM Performed at [facility], [place] [address], [place], [place] [address]    Special Requests   Final    BOTTLES DRAWN AEROBIC AND ANAEROBIC Blood Culture adequate volume Performed at [facility] [address] [place], [place] [address]    Culture   Final    NO GROWTH 2 DAYS Performed at [facility] [address], [place], [place] [address]    Report Status PENDING  Incomplete  Culture, blood (Routine x 2)     Status: None (Preliminary result)   Collection Time: 06/11/19  8:50 PM   Specimen: BLOOD  Result Value Ref Range Status   Specimen Description   Final    BLOOD RIGHT ANTECUBITAL Performed at [facility] [address], [place], [place] [address]    Special Requests   Final    BOTTLES DRAWN AEROBIC AND ANAEROBIC Blood Culture results may not be optimal due to an excessive volume of blood received in  culture bottles Performed at [facility] [address] [place],  [place] [address]    Culture   Final    NO GROWTH 2 DAYS Performed at [facility], [place] [address], [place], [place] [address]    Report Status PENDING  Incomplete  Respiratory Panel by RT PCR (Flu A&B, Covid) - Nasopharyngeal Swab     Status: Abnormal   Collection Time: 06/11/19  8:50 PM   Specimen: Nasopharyngeal Swab  Result Value Ref Range Status   SARS Coronavirus 2 by RT PCR POSITIVE (A) NEGATIVE Final    Comment: RESULT CALLED TO, READ BACK BY AND VERIFIED WITH: BULLOCK,A @ 2351 ON [id number] BY POTEAT,S    Influenza A by PCR NEGATIVE NEGATIVE Final   Influenza B by PCR NEGATIVE NEGATIVE Final    Comment: Performed at [facility], [place] [address] [place], [place] [address]  Culture, sputum-assessment     Status: None   Collection Time: 06/12/19  3:49 AM   Specimen: Sputum  Result Value Ref Range Status   Specimen Description SPU  Final   Special Requests NONE  Final   Sputum evaluation   Final    THIS SPECIMEN IS ACCEPTABLE FOR SPUTUM CULTURE Performed at [facility], [place] [address] [place], [place] [address]    Report Status 06/12/2019 FINAL  Final  Culture, respiratory     Status: None (Preliminary result)   Collection Time: 06/12/19  3:49 AM   Specimen: Sputum  Result Value Ref Range Status   Specimen Description   Final    SPU Performed at [facility] [address] [place], [place] [address]    Special Requests   Final    NONE Reflexed from [id number] Performed at [facility] [address], [place], [place] [address]    Gram Stain   Final    RARE WBC PRESENT, PREDOMINANTLY PMN MODERATE SQUAMOUS EPITHELIAL CELLS PRESENT ABUNDANT GRAM POSITIVE COCCI MODERATE YEAST RARE GRAM POSITIVE RODS    Culture   Final    CULTURE REINCUBATED FOR BETTER GROWTH Performed at [facility], [address] [place], [place] [address]    Report Status PENDING  Incomplete         Radiology Studies: DG Chest Port 1 View  Result Date: 06/13/2019 CLINICAL DATA:  PICC line placement EXAM: PORTABLE CHEST 1 VIEW COMPARISON:  06/11/2019 FINDINGS: Right upper extremity PICC line observed, the distal tip is somewhat indistinct but believed to be in the SVC. No pneumothorax. Continued patchy airspace opacities in both lungs, not appreciably changed. Low lung volumes with mildly elevated right hemidiaphragm. Atherosclerotic calcification of the aortic arch. Borderline cardiomegaly. IMPRESSION: 1. Right upper extremity PICC line tip: SVC. No pneumothorax. 2. Stable patchy airspace opacities in both lungs. Electronically Signed   By: Van Clines M.D.   On: 06/13/2019 18:16   [place] EKG SITE RITE  Result Date: 06/13/2019 If [facility] image not attached, placement could not be confirmed due to current cardiac rhythm.       Scheduled Meds: . vitamin C  500 mg Oral Daily  . atorvastatin  20 mg Oral QHS  . Chlorhexidine Gluconate Cloth  6 each Topical Daily  . dexamethasone (DECADRON) injection  6 mg Intravenous Q24H  . influenza vaccine adjuvanted  0.5 mL Intramuscular Tomorrow-1000  . insulin aspart  0-15 Units Subcutaneous TID WC  . insulin aspart  0-5 Units Subcutaneous QHS  . insulin aspart  10 Units Subcutaneous TID WC  . insulin detemir  20  Units Subcutaneous BID  . Ipratropium-Albuterol  1 puff Inhalation QID  . linagliptin  5 mg Oral Daily  . metoprolol tartrate  12.5 mg Oral BID  . pneumococcal 23 valent vaccine  0.5 mL Intramuscular Tomorrow-1000  . rivaroxaban  20 mg Oral Q supper  . sodium chloride flush  10-40 mL Intracatheter Q12H  . sodium chloride flush  3 mL Intravenous Q12H  . zinc sulfate  220 mg Oral Daily   Continuous Infusions: . sodium chloride 50 mL/hr at 06/14/19 1625  . famotidine (PEPCID) IV Stopped (06/15/19 0745)  . remdesivir 100 mg in NS 100 mL Stopped  (06/14/19 1110)     LOS: 4 days   The patient is critically ill with multiple organ systems failure and requires high complexity decision making for assessment and support, frequent evaluation and titration of therapies, application of advanced monitoring technologies and extensive interpretation of multiple databases. Critical Care Time devoted to patient care services described in this note  Time spent: 40 minutes     Samiyah Stupka, Geraldo Docker, MD [facility] Pager [phone]  If 7PM-7AM, please contact night-coverage [email or web address] Password [facility] 06/15/2019, 8:18 AM

## 2019-06-16 ENCOUNTER — Other Ambulatory Visit: Payer: Self-pay | Admitting: Cardiovascular Disease

## 2019-06-16 LAB — COMPREHENSIVE METABOLIC PANEL
ALT: 44 U/L (ref 0–44)
AST: 42 U/L — ABNORMAL HIGH (ref 15–41)
Albumin: 2.8 g/dL — ABNORMAL LOW (ref 3.5–5.0)
Alkaline Phosphatase: 50 U/L (ref 38–126)
Anion gap: 14 (ref 5–15)
BUN: 29 mg/dL — ABNORMAL HIGH (ref 8–23)
CO2: 19 mmol/L — ABNORMAL LOW (ref 22–32)
Calcium: 8.1 mg/dL — ABNORMAL LOW (ref 8.9–10.3)
Chloride: 102 mmol/L (ref 98–111)
Creatinine, Ser: 1.32 mg/dL — ABNORMAL HIGH (ref 0.61–1.24)
GFR calc Af Amer: >60 mL/min (ref >60)
GFR calc non Af Amer: 53 mL/min — ABNORMAL LOW (ref >60)
Glucose, Bld: 198 mg/dL — ABNORMAL HIGH (ref 70–99)
Potassium: 4.6 mmol/L (ref 3.5–5.1)
Sodium: 135 mmol/L (ref 135–145)
Total Bilirubin: 0.8 mg/dL (ref 0.3–1.2)
Total Protein: 6.4 g/dL — ABNORMAL LOW (ref 6.5–8.1)

## 2019-06-16 LAB — CBC WITH DIFFERENTIAL/PLATELET
Abs Immature Granulocytes: 0.08 10*3/uL — ABNORMAL HIGH (ref 0.00–0.07)
Basophils Absolute: 0 10*3/uL (ref 0.0–0.1)
Basophils Relative: 0 %
Eosinophils Absolute: 0 10*3/uL (ref 0.0–0.5)
Eosinophils Relative: 0 %
HCT: 35.5 % — ABNORMAL LOW (ref 39.0–52.0)
Hemoglobin: 12.2 g/dL — ABNORMAL LOW (ref 13.0–17.0)
Immature Granulocytes: 1 %
Lymphocytes Relative: 6 %
Lymphs Abs: 0.4 10*3/uL — ABNORMAL LOW (ref 0.7–4.0)
MCH: 33.8 pg (ref 26.0–34.0)
MCHC: 34.4 g/dL (ref 30.0–36.0)
MCV: 98.3 fL (ref 80.0–100.0)
Monocytes Absolute: 0.2 10*3/uL (ref 0.1–1.0)
Monocytes Relative: 3 %
Neutro Abs: 5 10*3/uL (ref 1.7–7.7)
Neutrophils Relative %: 90 %
Platelets: 142 10*3/uL — ABNORMAL LOW (ref 150–400)
RBC: 3.61 MIL/uL — ABNORMAL LOW (ref 4.22–5.81)
RDW: 13.2 % (ref 11.5–15.5)
WBC: 5.6 10*3/uL (ref 4.0–10.5)
nRBC: 0 % (ref 0.0–0.2)

## 2019-06-16 LAB — MAGNESIUM: Magnesium: 2 mg/dL (ref 1.7–2.4)

## 2019-06-16 LAB — GLUCOSE, CAPILLARY
Glucose-Capillary: 210 mg/dL — ABNORMAL HIGH (ref 70–99)
Glucose-Capillary: 215 mg/dL — ABNORMAL HIGH (ref 70–99)
Glucose-Capillary: 351 mg/dL — ABNORMAL HIGH (ref 70–99)

## 2019-06-16 LAB — C-REACTIVE PROTEIN: CRP: 11.7 mg/dL — ABNORMAL HIGH (ref <1.0)

## 2019-06-16 LAB — PHOSPHORUS: Phosphorus: 4.3 mg/dL (ref 2.5–4.6)

## 2019-06-16 LAB — D-DIMER, QUANTITATIVE: D-Dimer, Quant: 11.97 ug/mL-FEU — ABNORMAL HIGH (ref 0.00–0.50)

## 2019-06-16 LAB — FERRITIN: Ferritin: 803 ng/mL — ABNORMAL HIGH (ref 24–336)

## 2019-06-16 MED ORDER — LINAGLIPTIN 5 MG PO TABS: 5.0000 mg | ORAL_TABLET | Freq: Every day | ORAL | 0 refills | Status: DC

## 2019-06-16 MED ORDER — GUAIFENESIN-DM 100-10 MG/5ML PO SYRP: 10.0000 mL | ORAL_SOLUTION | ORAL | 0 refills | Status: DC | PRN

## 2019-06-16 MED ORDER — INSULIN PEN NEEDLE 29G X 12MM MISC: 0 refills | Status: DC

## 2019-06-16 MED ORDER — INSULIN ASPART 100 UNIT/ML FLEXPEN: 10.0000 [IU] | PEN_INJECTOR | Freq: Three times a day (TID) | SUBCUTANEOUS | 11 refills | Status: DC

## 2019-06-16 MED ORDER — METOPROLOL TARTRATE 25 MG PO TABS: 12.5000 mg | ORAL_TABLET | Freq: Two times a day (BID) | ORAL | 0 refills | Status: DC

## 2019-06-16 MED ORDER — ZINC SULFATE 220 (50 ZN) MG PO CAPS: 220.0000 mg | ORAL_CAPSULE | Freq: Every day | ORAL | 0 refills | Status: DC

## 2019-06-16 MED ORDER — DEXAMETHASONE 6 MG PO TABS: 6.0000 mg | ORAL_TABLET | Freq: Every day | ORAL | 0 refills | Status: DC

## 2019-06-16 MED ORDER — INSULIN DETEMIR 100 UNIT/ML FLEXPEN: 20.0000 [IU] | PEN_INJECTOR | Freq: Two times a day (BID) | SUBCUTANEOUS | 11 refills | Status: DC

## 2019-06-16 MED ORDER — IPRATROPIUM-ALBUTEROL 20-100 MCG/ACT IN AERS: 1.0000 | INHALATION_SPRAY | Freq: Four times a day (QID) | RESPIRATORY_TRACT | 0 refills | Status: DC

## 2019-06-16 MED ORDER — ASCORBIC ACID 500 MG PO TABS: 500.0000 mg | ORAL_TABLET | Freq: Every day | ORAL | 0 refills | Status: AC

## 2019-06-16 MED ORDER — ONDANSETRON HCL 4 MG PO TABS: 4.0000 mg | ORAL_TABLET | Freq: Four times a day (QID) | ORAL | 0 refills | Status: DC | PRN

## 2019-06-16 NOTE — Discharge Instructions (Signed)
Person Under Monitoring Name: [name]  Location: [place] [id number]   Infection Prevention Recommendations for Individuals Confirmed to have, or Being Evaluated for, 2019 Novel Coronavirus (COVID-19) Infection Who Receive Care at Home  Individuals who are confirmed to have, or are being evaluated for, COVID-19 should follow the prevention steps below until a healthcare provider or local or state health department says they can return to normal activities.  Stay home except to get medical care You should restrict activities outside your home, except for getting medical care. Do not go to work, school, or public areas, and do not use public transportation or taxis.  Call ahead before visiting your doctor Before your medical appointment, call the healthcare provider and tell them that you have, or are being evaluated for, COVID-19 infection. This will help the healthcare provider's office take steps to keep other people from getting infected. Ask your healthcare provider to call the local or state health department.  Monitor your symptoms Seek prompt medical attention if your illness is worsening (e.g., difficulty breathing). Before going to your medical appointment, call the healthcare provider and tell them that you have, or are being evaluated for, COVID-19 infection. Ask your healthcare provider to call the local or state health department.  Wear a facemask You should wear a facemask that covers your nose and mouth when you are in the same room with other people and when you visit a healthcare provider. People who live with or visit you should also wear a facemask while they are in the same room with you.  Separate yourself from other people in your home As much as possible, you should stay in a different room from other people in your home. Also, you should use a separate bathroom, if available.  Avoid sharing household items You should not share  dishes, drinking glasses, cups, eating utensils, towels, bedding, or other items with other people in your home. After using these items, you should wash them thoroughly with soap and water.  Cover your coughs and sneezes Cover your mouth and nose with a tissue when you cough or sneeze, or you can cough or sneeze into your sleeve. Throw used tissues in a lined trash can, and immediately wash your hands with soap and water for at least 20 seconds or use an alcohol-based hand rub.  Wash your [facility] your hands often and thoroughly with soap and water for at least 20 seconds. You can use an alcohol-based hand sanitizer if soap and water are not available and if your hands are not visibly dirty. Avoid touching your eyes, nose, and mouth with unwashed hands.   Prevention Steps for Caregivers and Household Members of Individuals Confirmed to have, or Being Evaluated for, COVID-19 Infection Being Cared for in the Home  If you live with, or provide care at home for, a person confirmed to have, or being evaluated for, COVID-19 infection please follow these guidelines to prevent infection:  Follow healthcare provider's instructions Make sure that you understand and can help the patient follow any healthcare provider instructions for all care.  Provide for the patient's basic needs You should help the patient with basic needs in the home and provide support for getting groceries, prescriptions, and other personal needs.  Monitor the patient's symptoms If they are getting sicker, call his or her medical provider and tell them that the patient has, or is being evaluated for, COVID-19 infection. This will help the healthcare provider's office  take steps to keep other people from getting infected. Ask the healthcare provider to call the local or state health department.  Limit the number of people who have contact with the patient  If possible, have only one caregiver for the patient.  Other  household members should stay in another home or place of residence. If this is not possible, they should stay  in another room, or be separated from the patient as much as possible. Use a separate bathroom, if available.  Restrict visitors who do not have an essential need to be in the home.  Keep older adults, very young children, and other sick people away from the patient Keep older adults, very young children, and those who have compromised immune systems or chronic health conditions away from the patient. This includes people with chronic heart, lung, or kidney conditions, diabetes, and cancer.  Ensure good ventilation Make sure that shared spaces in the home have good air flow, such as from an air conditioner or an opened window, weather permitting.  Wash your hands often  Wash your hands often and thoroughly with soap and water for at least 20 seconds. You can use an alcohol based hand sanitizer if soap and water are not available and if your hands are not visibly dirty.  Avoid touching your eyes, nose, and mouth with unwashed hands.  Use disposable paper towels to dry your hands. If not available, use dedicated cloth towels and replace them when they become wet.  Wear a facemask and gloves  Wear a disposable facemask at all times in the room and gloves when you touch or have contact with the patient's blood, body fluids, and/or secretions or excretions, such as sweat, saliva, sputum, nasal mucus, vomit, urine, or feces.  Ensure the mask fits over your nose and mouth tightly, and do not touch it during use.  Throw out disposable facemasks and gloves after using them. Do not reuse.  Wash your hands immediately after removing your facemask and gloves.  If your personal clothing becomes contaminated, carefully remove clothing and launder. Wash your hands after handling contaminated clothing.  Place all used disposable facemasks, gloves, and other waste in a lined container before  disposing them with other household waste.  Remove gloves and wash your hands immediately after handling these items.  Do not share dishes, glasses, or other household items with the patient  Avoid sharing household items. You should not share dishes, drinking glasses, cups, eating utensils, towels, bedding, or other items with a patient who is confirmed to have, or being evaluated for, COVID-19 infection.  After the person uses these items, you should wash them thoroughly with soap and water.  Wash laundry thoroughly  Immediately remove and wash clothes or bedding that have blood, body fluids, and/or secretions or excretions, such as sweat, saliva, sputum, nasal mucus, vomit, urine, or feces, on them.  Wear gloves when handling laundry from the patient.  Read and follow directions on labels of laundry or clothing items and detergent. In general, wash and dry with the warmest temperatures recommended on the label.  Clean all areas the individual has used often  Clean all touchable surfaces, such as counters, tabletops, doorknobs, bathroom fixtures, toilets, phones, keyboards, tablets, and bedside tables, every day. Also, clean any surfaces that may have blood, body fluids, and/or secretions or excretions on them.  Wear gloves when cleaning surfaces the patient has come in contact with.  Use a diluted bleach solution (e.g., dilute bleach with 1 part  bleach and 10 parts water) or a household disinfectant with a label that says EPA-registered for coronaviruses. To make a bleach solution at home, add 1 tablespoon of bleach to 1 quart (4 cups) of water. For a larger supply, add  cup of bleach to 1 gallon (16 cups) of water.  Read labels of cleaning products and follow recommendations provided on product labels. Labels contain instructions for safe and effective use of the cleaning product including precautions you should take when applying the product, such as wearing gloves or eye protection  and making sure you have good ventilation during use of the product.  Remove gloves and wash hands immediately after cleaning.  Monitor yourself for signs and symptoms of illness Caregivers and household members are considered close contacts, should monitor their health, and will be asked to limit movement outside of the home to the extent possible. Follow the monitoring steps for close contacts listed on the symptom monitoring form.   ? If you have additional questions, contact your local health department or call the epidemiologist on call at [phone] (available 24/7). ? This guidance is subject to change. For the most up-to-date guidance from [facility], please refer to their website: [email or web address]

## 2019-06-16 NOTE — Progress Notes (Signed)
Written and verbal discharge instructions given to patient.  Patient verbalized understanding of instructions.  Patient to be transported home via [facility] [place].  Home health to follow patient at home.

## 2019-06-16 NOTE — Discharge Summary (Addendum)
Physician Discharge Summary  [name] [id number] DOB: July 07, 1945 DOA: 06/11/2019  PCP: Cyndi Bender, PA-C  Admit date: 06/11/2019 Discharge date: 06/16/2019  Time spent: 30 minutes  Recommendations for Outpatient Follow-up:   Covid pneumonia/acute respiratory failure with hypoxia COVID-19 Labs  Recent Labs    06/14/19 0810 06/15/19 0525 06/16/19 0340  DDIMER 4.45* 5.83* 11.97*  FERRITIN 813* 789* 803*  CRP 4.2* 2.8* 11.7*    Lab Results  Component Value Date   SARSCOV2NAA POSITIVE (A) 06/11/2019   -Decadron 6 mg daily -Remdesivir per pharmacy protocol (end date 2/6) -2/2 transfuse 1 unit Covid convalescent plasma -Vitamins per pharmacy protocol -Flutter valve -Incentive spirometry -Combivent -Titrate O2 to maintain SPO2> 88% SATURATION QUALIFICATIONS: (Thisnote is usedto comply with regulatory documentation for home oxygen) Patient Saturations on Room Air at Rest =86% Patient Saturations on6Liters of oxygen while Ambulating = 81-90% Please briefly explain why patient needs home oxygen: Pt O2 sats less than 88% on RA at rest. Pt has been on 4-6L HFNC during the day. Pt ambulated on 6L and sats ranged from 81-90%. Pt now sitting in chair on 4L, sats in the low 90's. -Patient meets criteria for home O2 -6 L O2 via [place] titrate to maintain SPO2> 88% -Provide Inogen home portable O2 concentrator -Patient has been counseled that he will be considered contagious until 2/21.  Patient will need to take appropriate precautions i.e. wearing of mask, quarantine as much as possible, washing hands frequently, wiping down surfaces with disinfectant frequently etc. -Follow-up with PCP in 3 weeks   A. fib with RVR -Currently NSR -Metoprolol 12.5 mg BID -Continue Xarelto  Essential HTN -See A. fib RVR  CAD of native vessels  -Lipitor 20 mg daily -2/4 LDL = 67  Uncontrolled diabetes type 2 with complication -2/2 hemoglobin A1c= 7.3 -2/4 increase Levemir 20  units BID -2/4 increase NovoLog 10 units qac   Acute kidney injury (baseline Cr 1.3) -Hold ACE inhibitor.  PCP to decide when/if to restart Recent Labs  Lab 06/11/19 2050 06/13/19 0259 06/14/19 0810 06/15/19 0525 06/16/19 0340  CREATININE 1.68* 1.57* 1.42* 1.33* 1.32*  -At baseline  Hypomagnesmia -Magnesium goal> 2      Discharge Diagnoses:  Active Problems:   Hyperlipidemia   HYPERTENSION, BENIGN   CAD, NATIVE VESSEL   Uncontrolled type 2 diabetes mellitus with circulatory disorder   A-fib (HCC)   Chronic anticoagulation   Suspected COVID-19 virus infection   COVID-19 virus infection   Pneumonia due to COVID-19 virus   Acute respiratory failure with hypoxia (HCC)   AKI (acute kidney injury) ([place])   Discharge Condition: Stable  Diet recommendation: Carb modified  Filed Weights   06/11/19 1940  Weight: 108.9 kg    History of present illness:  74 y.o. WM PMHx  diabetes mellitus type 2, chronic kidney disease stage III, CAD hypertension atrial fibrillation on Xarelto s/p Colectomy for Colon cancer   Presents with shortness of breath, he went to his PCP and was found to be satting 85% on room air he said is gotten progressively worse over the last 5 days  Hospital Course:  During his hospitalization patient treated for Covid pneumonia/acute respiratory failure with hypoxia.  Patient responded well to treatment however will need to go home on oxygen.      VENTILATOR SETTINGS: Nasal cannula 2/6 Flow; 2 L/min  SPO2;  90%   Cultures  2/1 respiratory virus panel positive SARS coronavirus 2 by PCR 2/2 transfuse 1 unit Covid convalescent plasma  Antibiotics Anti-infectives (From admission, onward)   Start     Dose/Rate Stop   06/13/19 1000  remdesivir 100 mg in sodium chloride 0.9 % 100 mL IVPB  Status:  Discontinued     100 mg 200 mL/hr over 30 Minutes 06/12/19 0200   06/13/19 1000  remdesivir 100 mg in sodium chloride 0.9 % 100 mL IVPB     100  mg 200 mL/hr over 30 Minutes 06/17/19 0959   06/12/19 0200  remdesivir 200 mg in sodium chloride 0.9% 250 mL IVPB  Status:  Discontinued     200 mg 580 mL/hr over 30 Minutes 06/12/19 0200   06/11/19 2359  remdesivir 200 mg in sodium chloride 0.9% 250 mL IVPB     200 mg 580 mL/hr over 30 Minutes 06/12/19 0137       Discharge Exam: Vitals:   06/15/19 2100 06/16/19 0000 06/16/19 0345 06/16/19 0737  BP:  (!) 140/95 117/86 129/86  Pulse: 97 78 76 89  Resp: (!) 21 (!) 22 (!) 22 (!) 21  Temp:  97.7 F (36.5 C) 97.8 F (36.6 C) (!) 97.5 F (36.4 C)  TempSrc:  Oral Oral Oral  SpO2: 96% 96% 95% 94%  Weight:      Height:        General: A/O x4, positive acute respiratory distress Eyes: negative scleral hemorrhage, negative anisocoria, negative icterus ENT: Negative Runny nose, negative gingival bleeding, Neck:  Negative scars, masses, torticollis, lymphadenopathy, JVD Lungs: Clear to auscultation bilaterally without wheezes or crackles Cardiovascular: Regular rate and rhythm without murmur gallop or rub normal S1 and S2  Discharge Instructions   Allergies as of 06/16/2019   No Known Allergies     Medication List    STOP taking these medications   HumuLIN 70/30 KwikPen (70-30) 100 UNIT/ML PEN Generic drug: Insulin Isophane & Regular Human   ramipril 10 MG capsule Commonly known as: ALTACE   Trulicity 3 0000000 Sopn Generic drug: Dulaglutide     TAKE these medications   acetaminophen 500 MG tablet Commonly known as: TYLENOL Take 1,000 mg by mouth every 6 (six) hours as needed for mild pain, fever or headache.   ascorbic acid 500 MG tablet Commonly known as: VITAMIN C Take 1 tablet (500 mg total) by mouth daily.   atorvastatin 20 MG tablet Commonly known as: LIPITOR Take 20 mg by mouth at bedtime.   dexamethasone 6 MG tablet Commonly known as: DECADRON Take 1 tablet (6 mg total) by mouth daily.   guaiFENesin-dextromethorphan 100-10 MG/5ML syrup Commonly known  as: ROBITUSSIN DM Take 10 mLs by mouth every 4 (four) hours as needed for cough.   insulin aspart 100 UNIT/ML FlexPen Commonly known as: NOVOLOG Inject 10 Units into the skin 3 (three) times daily with meals.   Insulin Detemir 100 UNIT/ML Pen Commonly known as: LEVEMIR Inject 20 Units into the skin 2 (two) times daily.   Insulin Pen Needle 29G X 12MM Misc Use as directed What changed:   medication strength  See the new instructions.   Ipratropium-Albuterol 20-100 MCG/ACT Aers respimat Commonly known as: COMBIVENT Inhale 1 puff into the lungs 4 (four) times daily.   linagliptin 5 MG Tabs tablet Commonly known as: TRADJENTA Take 1 tablet (5 mg total) by mouth daily.   metoprolol tartrate 25 MG tablet Commonly known as: LOPRESSOR Take 0.5 tablets (12.5 mg total) by mouth 2 (two) times daily. What changed: See the new instructions.   nitroGLYCERIN 0.4 MG SL tablet Commonly known as:  NITROSTAT Place 0.4 mg under the tongue every 5 (five) minutes as needed for chest pain.   omeprazole 20 MG capsule Commonly known as: PRILOSEC Take 20 mg by mouth daily as needed (for acid reflux/indigestion).   ondansetron 4 MG tablet Commonly known as: ZOFRAN Take 1 tablet (4 mg total) by mouth every 6 (six) hours as needed for nausea.   Xarelto 20 MG Tabs tablet Generic drug: rivaroxaban TAKE 1 TABLET BY MOUTH EVERY DAY What changed:   how much to take  when to take this   zinc sulfate 220 (50 Zn) MG capsule Take 1 capsule (220 mg total) by mouth daily.            Durable Medical Equipment  (From admission, onward)         Start     Ordered   06/15/19 1759  For home use only DME oxygen  Once    Comments: SATURATION QUALIFICATIONS: (This note is used to comply with regulatory documentation for home oxygen) Patient Saturations on Room Air at Rest = 86% Patient Saturations on 6 Liters of oxygen while Ambulating = 81-90% Please briefly explain why patient needs home  oxygen:  Pt O2 sats less than 88% on RA at rest. Pt has been on 4-6L HFNC during the day. Pt ambulated on 6L and sats ranged from 81-90%. Pt now sitting in chair on 4L, sats in the low 90's.  -Patient meets criteria for home O2 -6 L O2 via [place] titrate to maintain SPO2> 88% -Provide Inogen home portable O2 concentrator  Question Answer Comment  Length of Need 12 Months   Mode or (Route) Nasal cannula   Liters per Minute 6   Frequency Continuous (stationary and portable oxygen unit needed)   Oxygen conserving device Yes   Oxygen delivery system Gas      06/15/19 1759         No Known Allergies    The results of significant diagnostics from this hospitalization (including imaging, microbiology, ancillary and laboratory) are listed below for reference.    Significant Diagnostic Studies: DG Chest Port 1 View  Result Date: 06/13/2019 CLINICAL DATA:  PICC line placement EXAM: PORTABLE CHEST 1 VIEW COMPARISON:  06/11/2019 FINDINGS: Right upper extremity PICC line observed, the distal tip is somewhat indistinct but believed to be in the SVC. No pneumothorax. Continued patchy airspace opacities in both lungs, not appreciably changed. Low lung volumes with mildly elevated right hemidiaphragm. Atherosclerotic calcification of the aortic arch. Borderline cardiomegaly. IMPRESSION: 1. Right upper extremity PICC line tip: SVC. No pneumothorax. 2. Stable patchy airspace opacities in both lungs. Electronically Signed   By: Van Clines M.D.   On: 06/13/2019 18:16   DG Chest Port 1 View  Result Date: 06/11/2019 CLINICAL DATA:  Increasing shortness of breath EXAM: PORTABLE CHEST 1 VIEW COMPARISON:  06/04/2017 FINDINGS: Cardiac shadow is mildly prominent but accentuated by the portable technique. Aortic calcifications are noted. Patchy opacities are noted within the lungs bilaterally worse on the left than the right. Elevation of the right hemidiaphragm is again seen. No bony abnormality is noted.  IMPRESSION: Patchy opacities consistent with multifocal pneumonia. Correlation with the pending COVID-19 testing is recommended. Electronically Signed   By: Inez Catalina M.D.   On: 06/11/2019 20:15   [place] EKG SITE RITE  Result Date: 06/13/2019 If [facility] image not attached, placement could not be confirmed due to current cardiac rhythm.   Microbiology: Recent Results (from the past 240 hour(s))  Culture,  blood (Routine x 2)     Status: None (Preliminary result)   Collection Time: 06/11/19  7:49 PM   Specimen: BLOOD  Result Value Ref Range Status   Specimen Description   Final    BLOOD BLOOD RIGHT FOREARM Performed at [facility] [address], [place], [place] [address]    Special Requests   Final    BOTTLES DRAWN AEROBIC AND ANAEROBIC Blood Culture adequate volume Performed at [facility] [address], [place], [place] [address]    Culture   Final    NO GROWTH 4 DAYS Performed at [facility], [place] [address] [place], [place] [address]    Report Status PENDING  Incomplete  Culture, blood (Routine x 2)     Status: None (Preliminary result)   Collection Time: 06/11/19  8:50 PM   Specimen: BLOOD  Result Value Ref Range Status   Specimen Description   Final    BLOOD RIGHT ANTECUBITAL Performed at [facility] [address], [place], [place] [address]    Special Requests   Final    BOTTLES DRAWN AEROBIC AND ANAEROBIC Blood Culture results may not be optimal due to an excessive volume of blood received in culture bottles Performed at [facility] [address], [place], [place] [address]    Culture   Final    NO GROWTH 4 DAYS Performed at [facility], [place] [address], [place], [place] [address]    Report Status PENDING  Incomplete  Respiratory Panel by RT PCR (Flu A&B, Covid) - Nasopharyngeal Swab     Status: Abnormal   Collection Time: 06/11/19  8:50 PM   Specimen: Nasopharyngeal  Swab  Result Value Ref Range Status   SARS Coronavirus 2 by RT PCR POSITIVE (A) NEGATIVE Final    Comment: RESULT CALLED TO, READ BACK BY AND VERIFIED WITH: [place],A @ 2351 ON [id number] BY POTEAT,S    Influenza A by PCR NEGATIVE NEGATIVE Final   Influenza B by PCR NEGATIVE NEGATIVE Final    Comment: Performed at [facility], [place] [address] [place], [place] [address]  Culture, sputum-assessment     Status: None   Collection Time: 06/12/19  3:49 AM   Specimen: Sputum  Result Value Ref Range Status   Specimen Description SPU  Final   Special Requests NONE  Final   Sputum evaluation   Final    THIS SPECIMEN IS ACCEPTABLE FOR SPUTUM CULTURE Performed at [facility], [place] [address], [place], [place] [address]    Report Status 06/12/2019 FINAL  Final  Culture, respiratory     Status: None   Collection Time: 06/12/19  3:49 AM   Specimen: Sputum  Result Value Ref Range Status   Specimen Description   Final    SPU Performed at [facility] [address], [place], [place] [address]    Special Requests   Final    NONE Reflexed from [id number] Performed at [facility] [address], [place], [place] [address]    Gram Stain   Final    RARE WBC PRESENT, PREDOMINANTLY PMN MODERATE SQUAMOUS EPITHELIAL CELLS PRESENT ABUNDANT GRAM POSITIVE COCCI MODERATE YEAST RARE GRAM POSITIVE RODS    Culture   Final    ABUNDANT Consistent with normal respiratory flora. Performed at [facility], [place] [address] [place],  [address]    Report Status 06/15/2019 FINAL  Final  Labs: Basic Metabolic Panel: Recent Labs  Lab 06/11/19 2050 06/12/19 0442 06/13/19 0259 06/14/19 0810 06/15/19 0525 06/16/19 0340  NA 130*  --  134* 136 135 135  K 4.1  --  4.5 4.8 3.9 4.6  CL 99  --  105 107 104 102  CO2 20*  --  20* 21* 23 19*  GLUCOSE 247*  --  259* 259* 90 198*  BUN 27*  --  35* 32* 31* 29*  CREATININE  1.68*  --  1.57* 1.42* 1.33* 1.32*  CALCIUM 8.3*  --  8.2* 8.6* 8.5* 8.1*  MG  --  1.9 1.9 1.8 1.6* 2.0  PHOS  --   --  2.3* 3.6 3.0 4.3   Liver Function Tests: Recent Labs  Lab 06/11/19 2050 06/13/19 0259 06/14/19 0810 06/15/19 0525 06/16/19 0340  AST 51* 44* 46* 49* 42*  ALT 33 37 39 42 44  ALKPHOS 40 54 53 46 50  BILITOT 0.7 0.2* 0.6 0.8 0.8  PROT 7.0 6.1* 6.5 6.1* 6.4*  ALBUMIN 3.2* 2.8* 3.0* 2.9* 2.8*   No results for input(s): LIPASE, AMYLASE in the last 168 hours. No results for input(s): AMMONIA in the last 168 hours. CBC: Recent Labs  Lab 06/12/19 0442 06/13/19 0259 06/14/19 0810 06/15/19 0525 06/16/19 0340  WBC 2.1* 6.5 6.8 7.9 5.6  NEUTROABS 1.6* 5.7 6.0 6.7 5.0  HGB 12.1* 11.1* 12.0* 11.5* 12.2*  HCT 34.8* 33.4* 35.6* 33.9* 35.5*  MCV 97.2 99.4 99.2 98.3 98.3  PLT PLATELET CLUMPS NOTED ON SMEAR, COUNT APPEARS ADEQUATE PLATELET CLUMPS NOTED ON SMEAR, COUNT APPEARS ADEQUATE 133* 134* 142*   Cardiac Enzymes: No results for input(s): CKTOTAL, CKMB, CKMBINDEX, TROPONINI in the last 168 hours. BNP: BNP (last 3 results) No results for input(s): BNP in the last 8760 hours.  ProBNP (last 3 results) No results for input(s): PROBNP in the last 8760 hours.  CBG: Recent Labs  Lab 06/15/19 2011 06/15/19 2042 06/15/19 2320 06/16/19 0329 06/16/19 0736  GLUCAP 60* 88 103* 210* 215*       Signed:  Dia [place], MD [facility] [phone] pager

## 2019-06-16 NOTE — Progress Notes (Signed)
Spoke with [name] and answered all questions at this about the patient's condition at this time.

## 2019-06-16 NOTE — TOC Transition Note (Signed)
Transition of Care [facility]) - CM/SW Discharge Note   Patient Details  Name: [name] MRN: [id number] Date of Birth: 05-18-1945  [facility] CM/SW Contact:  Atilano Median, LCSW Phone Number: 06/16/2019, 11:20 AM   Clinical Narrative:     Discharged home with oxygen. Concentrator will be delivered to patient's home by [employer]. Wife to accept at home.   Patient and wife aware and agreeable to this plan. No other needs at this time. Case closed to this CSW.  Final next level of care: Home w Home Health Services Barriers to Discharge: Barriers Resolved   Patient Goals and CMS Choice   CMS Medicare.gov Compare Post Acute Care list provided to:: Patient Choice offered to / list presented to : Patient  Discharge Placement                Patient to be transferred to facility by: [place] Name of family member notified: [name] Patient and family notified of of transfer: 06/16/19  Discharge Plan and Services                DME Arranged: Oxygen DME Agency: [facility] Date DME Agency Contacted: 06/16/19 Time DME Agency Contacted: 1119 Representative spoke with at DME Agency: Magda Paganini HH Arranged: PT, OT, RN [facility] Agency: [facility] Date [place]: 06/16/19 Time [place]: 1119 Representative spoke with at [place]: Cassie  Social Determinants of Health ([place]) Interventions     Readmission Risk Interventions No flowsheet data found.

## 2019-06-17 LAB — CULTURE, BLOOD (ROUTINE X 2)
Culture: NO GROWTH
Culture: NO GROWTH
Special Requests: ADEQUATE

## 2019-06-18 ENCOUNTER — Telehealth: Payer: Medicare Other | Admitting: Physician Assistant

## 2019-06-18 NOTE — Telephone Encounter (Signed)
Pt has an appointment scheduled for 07/11/2019. Will refill to get to appointment.

## 2019-06-18 NOTE — Telephone Encounter (Signed)
Prescription refill request for Xarelto received.   Last office visit: 2/3/2020[name] Weight: 108.9kg Age: 74 yo Scr:1.32, 06/16/2019 CrCl: 77 ml/min  Pt overdue for an office visit.

## 2019-06-18 NOTE — Telephone Encounter (Signed)
Called and spoke to pt's wife. Informed them that pt was overdue for an appointment with the cardiologist. Transferred her to the mainline to schedule an appointment.

## 2019-06-19 ENCOUNTER — Telehealth: Payer: Self-pay | Admitting: Internal Medicine

## 2019-06-19 NOTE — Telephone Encounter (Signed)
Patient had to go to the hospital and Dr Sherral Hammers at [facility] prescribed patient with a few new medications and the patients wife called stating the prescribing Dr did not give any instructions as to how long to take these RX's and the at home nurse said to call Dr Cruzita Lederer to find out if patient needs to stay on RX until his appointment on March 30th.   wifes ph# [phone]

## 2019-06-19 NOTE — Telephone Encounter (Signed)
Hospital DM report:   Results for [name], [name] (MRN [id number]) as of 06/13/2019 10:34  Ref. Range 06/12/2019 07:09 06/12/2019 11:35 06/12/2019 16:10 06/12/2019 19:25 06/12/2019 20:45 06/12/2019 23:56 06/13/2019 04:05 06/13/2019 07:33  Glucose-Capillary Latest Ref Range: 70 - 99 mg/dL 211 (H) 284 (H) 357 (H) 477 (H) 392 (H) 243 (H) 242 (H) 294 (H)   Review of Glycemic Control  Diabetes history: DM2 Outpatient Diabetes medications: Trulicity 3 mg Qweek, 0000000 25 units BID Current orders for Inpatient glycemic control: Levemir 10 units BID, Novolog 0-15 units TID with meals, Novolog 0-5 units QHS, Novolog 3 units TID with meals, Tradjenta 5 mg daily; Decadron 6 mg Q24H  Inpatient Diabetes Program Recommendations:   Insulin - Basal: If steroids are continued, please consider increasing Levemir to 20 units BID.  Insulin-Meal Coverage: If steroids are continued, please consider increasing meal coverage to Novolog 6 units TID with meals if patient eats at least 50% of meals.  STOP taking these medications       HumuLIN 70/30 KwikPen (70-30) 100 UNIT/ML PEN Generic drug: Insulin Isophane & Regular Human   ramipril 10 MG capsule Commonly known as: ALTACE   Trulicity 3 0000000 Sopn Generic drug: Dulaglutide

## 2019-06-19 NOTE — Telephone Encounter (Signed)
Agree with the plan.

## 2019-06-19 NOTE — Telephone Encounter (Signed)
na

## 2019-06-20 NOTE — Telephone Encounter (Signed)
Notified patient's wife to continue with the DC meds and we will review at his upcoming appointment.

## 2019-07-06 ENCOUNTER — Other Ambulatory Visit: Payer: Self-pay | Admitting: Internal Medicine

## 2019-07-09 NOTE — Progress Notes (Signed)
Virtual Visit via Telephone Note   This visit type was conducted due to national recommendations for restrictions regarding the COVID-19 Pandemic (e.g. social distancing) in an effort to limit this patient's exposure and mitigate transmission in our community.  Due to his co-morbid illnesses, this patient is at least at moderate risk for complications without adequate follow up.  This format is felt to be most appropriate for this patient at this time.  The patient did not have access to video technology/had technical difficulties with video requiring transitioning to audio format only (telephone).  All issues noted in this document were discussed and addressed.  No physical exam could be performed with this format.  Please refer to the patient's chart for his  consent to telehealth for [facility].   Date:  07/11/2019   ID:  TORRANCE HON, DOB 01/20/1946, MRN [id number]  Patient Location: Home Provider Location: Home  PCP:  Cyndi Bender, PA-C  Cardiologist:  Lauree Chandler, MD   Electrophysiologist:  None   Evaluation Performed:  Follow-Up Visit  Chief Complaint: F/U  History of Present Illness:    HUDSYN SCHIAPPA is a 74 y.o. male with history of CAD status post anterior STEMI 2001 treated with BMS to the LAD, cath in 2006 LAD stent patent and 80% narrowing of a diagonal branch treated medically.  Atrial fibrillation 02/2012 underwent cardioversion but reverted back to A. fib and has been controlled with metoprolol and has been on Xarelto, AAA followed by VVS, hypertension, HLD, DM, sigmoid stricture in 2019 status post sigmoidectomy.  Patient last saw Dr. Angelena Form 06/12/2018 at which time he was doing well.  Patient was discharged from the hospital 06/16/2019 with Covid pneumonia/acute respiratory failure with hypoxia treated with remdesivir, Decadron and Covid convalescent plasma and O2.  He also had some A. fib with RVR treated with metoprolol and continued on  Xarelto.  Patient still gets short of breath with activity and HR goes up with drop in O2. Is on a Zpack for sinus problems. HR went up to 117 yesterday when he walked in to Dr.'s office from walking in and O2 dropped with walking to 83% went up quickly. O2 90% now.Denies chest pain.   The patient does not have symptoms concerning for COVID-19 infection (fever, chills, cough, or new shortness of breath).    Past Medical History:  Diagnosis Date  . AAA (abdominal aortic aneurysm) ([place])   . Allergy   . Atrial fibrillation [facility]) October 2013   Xarelto started; duration unknown  . Cataract   . Chronic kidney disease    Stage III   . Coronary artery disease    post bare-metal stent to LAD in 2001  . Diabetes mellitus    type 2   . Hypertension   . Myocardial infarct ([place]) 2001  . Obesity    Past Surgical History:  Procedure Laterality Date  . CARDIAC CATHETERIZATION  2006   stable with primarily nonobstructive disease  . CARDIOVERSION  03/27/2012   Procedure: CARDIOVERSION;  Surgeon: Carlena Bjornstad, MD;  Location: [facility] ENDOSCOPY;  Service: Cardiovascular;  Laterality: N/A;  . COLOSTOMY TAKEDOWN N/A 03/08/2018   Procedure: LAPAROSCOPIC HARTMANNS REVERSAL;  Surgeon: Ileana Roup, MD;  Location: [facility] ORS;  Service: General;  Laterality: N/A;  . CORONARY ANGIOPLASTY WITH STENT PLACEMENT  2001   bare-metal stent to the LAD  . CYSTOSCOPY W/ URETERAL STENT PLACEMENT Bilateral 03/08/2018   Procedure: CYSTOSCOPY WITH RETROGRADE AND BILATERAL URETERAL STENT PLACEMENT;  Surgeon: Gloriann Loan,  Desiree Hane, MD;  Location: [facility] ORS;  Service: Urology;  Laterality: Bilateral;  . CYSTOSCOPY WITH STENT PLACEMENT Bilateral 06/03/2017   Procedure: CYSTOSCOPY WITH STENT PLACEMENT;  Surgeon: Festus Aloe, MD;  Location: [facility] ORS;  Service: Urology;  Laterality: Bilateral;  . FLEXIBLE SIGMOIDOSCOPY N/A 03/08/2018   Procedure: FLEXIBLE SIGMOIDOSCOPY;  Surgeon: Ileana Roup, MD;  Location: [facility] ORS;   Service: General;  Laterality: N/A;  . HERNIA REPAIR  2006  . LAPAROSCOPIC PARASTOMAL HERNIA N/A 03/08/2018   Procedure: PARASTOMAL HERNIA REPAIR;  Surgeon: Ileana Roup, MD;  Location: [facility] ORS;  Service: General;  Laterality: N/A;  . LAPAROSCOPIC SIGMOID COLECTOMY N/A 06/03/2017   Procedure: LAPAROSCOPIC  SIGMOID COLECTOMY, COLOSTOMY;  Surgeon: Ileana Roup, MD;  Location: [facility] ORS;  Service: General;  Laterality: N/A;  . SKIN GRAFT  1981     Current Meds  Medication Sig  . acetaminophen (TYLENOL) 500 MG tablet Take 1,000 mg by mouth every 6 (six) hours as needed for mild pain, fever or headache.  Marland Kitchen ascorbic acid (VITAMIN C) 500 MG tablet Take 1 tablet (500 mg total) by mouth daily.  Marland Kitchen atorvastatin (LIPITOR) 20 MG tablet Take 20 mg by mouth at bedtime.   Marland Kitchen dexamethasone (DECADRON) 6 MG tablet Take 1 tablet (6 mg total) by mouth daily.  . Dulaglutide (TRULICITY) 3 0000000 SOPN Inject 1 Dose into the skin once a week.  Marland Kitchen guaiFENesin-dextromethorphan (ROBITUSSIN DM) 100-10 MG/5ML syrup Take 10 mLs by mouth every 4 (four) hours as needed for cough.  . insulin NPH-regular Human (70-30) 100 UNIT/ML injection Inject 100 Units into the skin in the morning and at bedtime.  . Insulin Pen Needle 29G X 12MM MISC Use as directed  . Ipratropium-Albuterol (COMBIVENT) 20-100 MCG/ACT AERS respimat Inhale 1 puff into the lungs 4 (four) times daily.  . metoprolol tartrate (LOPRESSOR) 25 MG tablet Take 0.5 tablets (12.5 mg total) by mouth 2 (two) times daily.  . nitroGLYCERIN (NITROSTAT) 0.4 MG SL tablet Place 0.4 mg under the tongue every 5 (five) minutes as needed for chest pain.   Marland Kitchen omeprazole (PRILOSEC) 20 MG capsule Take 20 mg by mouth daily as needed (for acid reflux/indigestion).   . ondansetron (ZOFRAN) 4 MG tablet Take 1 tablet (4 mg total) by mouth every 6 (six) hours as needed for nausea.  . rivaroxaban (XARELTO) 20 MG TABS tablet Take 1 tablet (20 mg total) by mouth daily with supper.      Allergies:   Patient has no known allergies.   Social History   Tobacco Use  . Smoking status: Former Smoker    Packs/day: 0.50    Years: 40.00    Pack years: 20.00    Types: Cigarettes, Pipe, Cigars    Quit date: 05/11/1999    Years since quitting: 20.1  . Smokeless tobacco: Never Used  Substance Use Topics  . Alcohol use: No  . Drug use: No     Family Hx: The patient's family history includes ALS in his mother; Diabetes in his sister; Heart attack in his father; Heart disease in his father; Heart failure in his brother; Hypertension in his sister. There is no history of Stroke.  ROS:   Please see the history of present illness.      All other systems reviewed and are negative.   Prior CV studies:   The following studies were reviewed today:  Lexiscan Myoview 12/21/2017  Nuclear stress EF: 50%. There is dyskinesis anteroapical segment.  There was no  ST segment deviation noted during stress.  Defect 1: There is a medium defect of moderate severity present in the mid anteroseptal, apical anterior and apical septal location.  Findings consistent with prior myocardial infarction with peri-infarct ischemia.  This is an intermediate risk study.   Ultrasound of abdominal aortic aneurysm 03/08/2019  Summary:  Abdominal Aorta: The largest aortic measurement is 4.3 cm. The largest  aortic diameter has increased compared to prior exam. Previous diameter  measurement was 3.9 cm obtained on 09/12/17.      Labs/Other Tests and Data Reviewed:    EKG:  An ECG dated 06/11/19 and 06/12/19 was personally reviewed today and demonstrated:  AFib with RVR on 2/1 and AFib controlled 2/2 nonspecific ST changes.  Recent Labs: 06/16/2019: ALT 44; BUN 29; Creatinine, Ser 1.32; Hemoglobin 12.2; Magnesium 2.0; Platelets 142; Potassium 4.6; Sodium 135   Recent Lipid Panel Lab Results  Component Value Date/Time   CHOL 115 06/14/2019 08:10 AM   TRIG 67 06/14/2019 08:10 AM   HDL 35 (L)  06/14/2019 08:10 AM   CHOLHDL 3.3 06/14/2019 08:10 AM   LDLCALC 67 06/14/2019 08:10 AM    Wt Readings from Last 3 Encounters:  07/11/19 224 lb (101.6 kg)  06/11/19 240 lb (108.9 kg)  03/28/19 242 lb (109.8 kg)     Objective:    Vital Signs:  BP 106/64   Pulse (!) 102   Ht 6\' 3"  (1.905 m)   Wt 224 lb (101.6 kg)   SpO2 94%   BMI 28.00 kg/m    VITAL SIGNS:  reviewed  ASSESSMENT & PLAN:    1. Recent hospitalization for Covid pneumonia treated with Decadron Covid convalescent plasma and remdesivir. Still having low O2 sats with exertion. Being followed closely by PCP. 2. CAD status post anterior STEMI 2001 treated with BMS to the LAD, cath in 2006 LAD stent patent, 80% narrowing of the diagonal branch treated medically.  Lexiscan Myoview 12/2017 scar no ischemia LVEF 50%. No angina 3. Persistent atrial fibrillation on metoprolol and Xarelto-some fast rates with exertion since covid19 4. AAA followed by VVS 4.3 cm 03/08/2019 5. Hypertension BP running low since 20 lb weight loss  6. Hyperlipidemia LDL 67 06/14/19 on lipitor 7. Diabetes mellitus uncontrolled but starting to come down since back on meds  COVID-19 Education: The signs and symptoms of COVID-19 were discussed with the patient and how to seek care for testing (follow up with PCP or arrange E-visit).   The importance of social distancing was discussed today.  Time:   Today, I have spent 19:40 minutes with the patient with telehealth technology discussing the above problems.     Medication Adjustments/Labs and Tests Ordered: Current medicines are reviewed at length with the patient today.  Concerns regarding medicines are outlined above.   Tests Ordered: No orders of the defined types were placed in this encounter.   Medication Changes: No orders of the defined types were placed in this encounter.   Follow Up:  Either In Person or Virtual in 6 month(s) Dr. Angelena Form  Signed, Ermalinda Barrios, PA-C  07/11/2019 10:44 AM     [place]

## 2019-07-11 ENCOUNTER — Other Ambulatory Visit: Payer: Self-pay

## 2019-07-11 ENCOUNTER — Telehealth (INDEPENDENT_AMBULATORY_CARE_PROVIDER_SITE_OTHER): Payer: Medicare Other | Admitting: Physician Assistant

## 2019-07-11 ENCOUNTER — Encounter: Payer: Self-pay | Admitting: Physician Assistant

## 2019-07-11 ENCOUNTER — Telehealth: Payer: Medicare Other | Admitting: Physician Assistant

## 2019-07-11 VITALS — BP 106/64 | HR 102 | Ht 75.0 in | Wt 224.0 lb

## 2019-07-11 DIAGNOSIS — Z8701 Personal history of pneumonia (recurrent): Secondary | ICD-10-CM

## 2019-07-11 DIAGNOSIS — I2510 Atherosclerotic heart disease of native coronary artery without angina pectoris: Secondary | ICD-10-CM

## 2019-07-11 DIAGNOSIS — I252 Old myocardial infarction: Secondary | ICD-10-CM | POA: Diagnosis not present

## 2019-07-11 DIAGNOSIS — Z8616 Personal history of COVID-19: Secondary | ICD-10-CM

## 2019-07-11 DIAGNOSIS — IMO0002 Reserved for concepts with insufficient information to code with codable children: Secondary | ICD-10-CM

## 2019-07-11 DIAGNOSIS — I4821 Permanent atrial fibrillation: Secondary | ICD-10-CM

## 2019-07-11 DIAGNOSIS — E1165 Type 2 diabetes mellitus with hyperglycemia: Secondary | ICD-10-CM

## 2019-07-11 DIAGNOSIS — I714 Abdominal aortic aneurysm, without rupture, unspecified: Secondary | ICD-10-CM

## 2019-07-11 DIAGNOSIS — J1282 Pneumonia due to coronavirus disease 2019: Secondary | ICD-10-CM

## 2019-07-11 DIAGNOSIS — E785 Hyperlipidemia, unspecified: Secondary | ICD-10-CM

## 2019-07-11 DIAGNOSIS — I4819 Other persistent atrial fibrillation: Secondary | ICD-10-CM

## 2019-07-11 DIAGNOSIS — E1151 Type 2 diabetes mellitus with diabetic peripheral angiopathy without gangrene: Secondary | ICD-10-CM

## 2019-07-11 DIAGNOSIS — I10 Essential (primary) hypertension: Secondary | ICD-10-CM

## 2019-07-11 NOTE — Progress Notes (Signed)
@Patient  ID: [name], male    DOB: May 27, 1945, 74 y.o.   MRN: [id number]  Chief Complaint  Patient presents with  . Shortness of Breath    due to COVID    Referring provider: Cyndi Bender, PA-C   74 year old male with history of DM2 (insulin dependent), CKD stage III, CAD, A-fib (on Xarelto), obesity, colon cancer (sp colostomy take down). Recently hospitalized for COVID pneumonia/respiratory failure.  Recent Significant Encounters:   Hospital Admission 2/1/1- 06/16/19 - Patient was hospitalized for Covid Pnuemonia and respiratory failure with hypoxia. He was treated with remdesivir (end date 2/6), decadron, Covid convalescent plasma, and O2. He was noted to have A-fib with RVR and was treated with metroprolol and continued on Xarelto  Follow up with PCP: 07/02/19 - Labs ordered, O2 was D/C'd  Follow up Cardiology 07/11/19 - Continue xarelto, continue metropolol 12.5 mg BID, Follow up in 6 months   Tests:   06/11/19: SARS Coronavirus 2 - Positive  Imaging:   Chest xray 06/11/19: Patchy opacities consistent with multifocal pneumonia.   Chest xray 06/13/19: 1. Right upper extremity PICC line tip: SVC. No pneumothorax. 2. Stable patchy airspace opacities in both lungs.  Cardiac:   EKG 06/11/19 and 06/12/19 reviewed by cardiology on 07/11/19 demonstrated A-fib with RVR on 2/1 and A-fib controlled on 06/12/19 with non specific ST changes.   Labs:   06/16/2019: ALT 44; BUN 29; Creatinine, Ser 1.32; Hemoglobin 12.2; Magnesium 2.0; Platelets 142; Potassium 4.6; Sodium 135       HPI   Patient presents today for post covid care follow-up visit. Patient states that since hospital discharge he has been doing well overall. Patient was discharged home from the hospital on home O2, but this was recently D/C'd by PCP at hospital follow-up appointment. He states that he has home PT, OT, and home health nursing coming out to the house. He states that his O2 sats have been dropping with exertion down  to around 83% with PT. Patient states that while at rest his O2 sats stay in the upper 90's on room air. Patient was recently placed on azithromycin for sinus infection by PCP- he is on day 3 of this prescription. Patient is compliant with using flutter valve and incentive spirometry given to him at hospital discharge. Patient states that he has had loss of appetite recently. He admits that he hasn't been drinking enough water either recently.  Denies f/c/s, n/v/d, hemoptysis, PND, leg swelling. He denies chest pain or edema. Patient has combivent inhaler to use as needed and states that he has been using it around once per day. Patient has already had follow-up with PCP including labs. He has also had follow- up with cardiology. He is scheduled for a follow up with endocrinology for diabetic management later this month. Patient states that he is complaint with medications.     Note: patient was walked in office today: O2 sats dropped to 86% on room air, Sats returned to 95% with 4L [place]      No Known Allergies  Immunization History  Administered Date(s) Administered  . Fluad Quad(high Dose 65+) 02/08/2019  . Influenza, High Dose Seasonal PF 02/12/2015, 02/12/2015, 02/11/2018  . Influenza-Unspecified 02/06/2014, 02/23/2016  . Pneumococcal Conjugate-13 03/26/2014  . Pneumococcal Polysaccharide-23 10/18/2009  . Td 03/18/2009  . Zoster 02/07/2013  . Zoster Recombinat (Shingrix) 06/11/2018, 12/04/2018    Past Medical History:  Diagnosis Date  . AAA (abdominal aortic aneurysm) ([place])   . Allergy   .  Atrial fibrillation [facility]) October 2013   Xarelto started; duration unknown  . Cataract   . Chronic kidney disease    Stage III   . Coronary artery disease    post bare-metal stent to LAD in 2001  . Diabetes mellitus    type 2   . Hypertension   . Myocardial infarct ([place]) 2001  . Obesity     Tobacco History: Social History   Tobacco Use  Smoking Status Former Smoker  . Packs/day: 0.50   . Years: 40.00  . Pack years: 20.00  . Types: Cigarettes, Pipe, Cigars  . Quit date: 05/11/1999  . Years since quitting: 20.1  Smokeless Tobacco Never Used   Counseling given: Yes   Outpatient Encounter Medications as of 07/12/2019  Medication Sig  . acetaminophen (TYLENOL) 500 MG tablet Take 1,000 mg by mouth every 6 (six) hours as needed for mild pain, fever or headache.  Marland Kitchen ascorbic acid (VITAMIN C) 500 MG tablet Take 1 tablet (500 mg total) by mouth daily.  Marland Kitchen atorvastatin (LIPITOR) 20 MG tablet Take 20 mg by mouth at bedtime.   . insulin NPH-regular Human (70-30) 100 UNIT/ML injection Inject 100 Units into the skin in the morning and at bedtime.  . Ipratropium-Albuterol (COMBIVENT) 20-100 MCG/ACT AERS respimat Inhale 1 puff into the lungs 4 (four) times daily.  . metoprolol tartrate (LOPRESSOR) 25 MG tablet Take 0.5 tablets (12.5 mg total) by mouth 2 (two) times daily.  . rivaroxaban (XARELTO) 20 MG TABS tablet Take 1 tablet (20 mg total) by mouth daily with supper.  . dexamethasone (DECADRON) 6 MG tablet Take 1 tablet (6 mg total) by mouth daily. (Patient not taking: Reported on 07/12/2019)  . Dulaglutide (TRULICITY) 3 0000000 SOPN Inject 1 Dose into the skin once a week.  Marland Kitchen guaiFENesin-dextromethorphan (ROBITUSSIN DM) 100-10 MG/5ML syrup Take 10 mLs by mouth every 4 (four) hours as needed for cough.  . Insulin Pen Needle 29G X 12MM MISC Use as directed  . nitroGLYCERIN (NITROSTAT) 0.4 MG SL tablet Place 0.4 mg under the tongue every 5 (five) minutes as needed for chest pain.   Marland Kitchen omeprazole (PRILOSEC) 20 MG capsule Take 20 mg by mouth daily as needed (for acid reflux/indigestion).   . ondansetron (ZOFRAN) 4 MG tablet Take 1 tablet (4 mg total) by mouth every 6 (six) hours as needed for nausea.   No facility-administered encounter medications on file as of 07/12/2019.     Review of Systems  Review of Systems  Constitutional: Positive for appetite change (decreased) and fatigue.  Negative for chills, diaphoresis, fever and unexpected weight change.  Respiratory: Positive for shortness of breath. Negative for cough and wheezing.   Cardiovascular: Negative for chest pain, palpitations and leg swelling.  Musculoskeletal: Negative for arthralgias and myalgias.  All other systems reviewed and are negative.      Physical Exam  BP 118/80   Wt 221 lb (100.2 kg)   SpO2 90%   BMI 27.62 kg/m   Wt Readings from Last 5 Encounters:  07/12/19 221 lb (100.2 kg)  07/11/19 224 lb (101.6 kg)  06/11/19 240 lb (108.9 kg)  03/28/19 242 lb (109.8 kg)  03/08/19 240 lb (108.9 kg)     Physical Exam Vitals and nursing note reviewed.  Constitutional:      General: He is not in acute distress.    Appearance: He is well-developed and well-groomed. He is not ill-appearing, toxic-appearing or diaphoretic.  Cardiovascular:     Rate and Rhythm: Normal rate.  Rhythm irregular.     Comments: Chronic a-fib  Pulmonary:     Effort: Pulmonary effort is normal.     Breath sounds: Normal breath sounds.     Comments: Crackles to bilateral bases greater on left side Skin:    General: Skin is warm and dry.  Neurological:     Mental Status: He is alert and oriented to person, place, and time.  Psychiatric:        Behavior: Behavior is cooperative.      Lab Results:  CBC    Component Value Date/Time   WBC 5.6 06/16/2019 0340   RBC 3.61 (L) 06/16/2019 0340   HGB 12.2 (L) 06/16/2019 0340   HCT 35.5 (L) 06/16/2019 0340   PLT 142 (L) 06/16/2019 0340   MCV 98.3 06/16/2019 0340   MCH 33.8 06/16/2019 0340   MCHC 34.4 06/16/2019 0340   RDW 13.2 06/16/2019 0340   LYMPHSABS 0.4 (L) 06/16/2019 0340   MONOABS 0.2 06/16/2019 0340   EOSABS 0.0 06/16/2019 0340   BASOSABS 0.0 06/16/2019 0340    BMET    Component Value Date/Time   NA 135 06/16/2019 0340   NA 142 03/28/2014 0000   K 4.6 06/16/2019 0340   CL 102 06/16/2019 0340   CO2 19 (L) 06/16/2019 0340   GLUCOSE 198 (H)  06/16/2019 0340   BUN 29 (H) 06/16/2019 0340   BUN 14 03/28/2014 0000   CREATININE 1.32 (H) 06/16/2019 0340   CALCIUM 8.1 (L) 06/16/2019 0340   GFRNONAA 53 (L) 06/16/2019 0340   GFRAA >60 06/16/2019 0340    BNP No results found for: BNP  ProBNP No results found for: PROBNP  Imaging: DG Chest Port 1 View  Result Date: 06/13/2019 CLINICAL DATA:  PICC line placement EXAM: PORTABLE CHEST 1 VIEW COMPARISON:  06/11/2019 FINDINGS: Right upper extremity PICC line observed, the distal tip is somewhat indistinct but believed to be in the SVC. No pneumothorax. Continued patchy airspace opacities in both lungs, not appreciably changed. Low lung volumes with mildly elevated right hemidiaphragm. Atherosclerotic calcification of the aortic arch. Borderline cardiomegaly. IMPRESSION: 1. Right upper extremity PICC line tip: SVC. No pneumothorax. 2. Stable patchy airspace opacities in both lungs. Electronically Signed   By: Van Clines M.D.   On: 06/13/2019 18:16   [place] EKG SITE RITE  Result Date: 06/13/2019 [facility] image not attached, placement could not be confirmed due to current cardiac rhythm.    Assessment & Plan:   Acute respiratory failure with hypoxia (HCC) Assessment: Patient is doing well overall, but O2 sats have recently been dropping with exertion. Patient walked in office today - O2 sats dropped to 86% on room air, Sats returned to 95% with 4L Will order portable O2 to wear at 4 L with exertion  Plan: Patient Instructions  Will order portable O2 to wear at 4L with exertion - room air with rest through [place] Will place referral to pulmonary Will order chest x ray and will call with results Continue combivent inaler as needed    Pneumonia due to COVID-19 virus Assessment: Patient noted to have crackles to bilateral bases.   Plan:  Will place referral to pulmonary Will order chest x ray and will call with results Continue combivent inaler as needed Patient is  currently finishing prescription for azithromycin ordered by PCP earlier this week. Please finish entire course.   Uncontrolled type 2 diabetes mellitus with circulatory disorder Please keep follow up as scheduled with Endocrinology for diabetes management.  Patient Instructions  Will order portable O2 to wear at 4L [place] with exertion - room air with rest through [place] Will place referral to pulmonary Will order chest x ray and will call with results Continue combivent inaler as needed Continue Incentive Spirometry and Flutter  Keep follow-up with cardiology  Keep follow-up for diabetic management with endocrinology  Hand out given  - guide to covid-19 nutritional rehabilitation - Diet high in protein and nutritional supplements to help with recovery  Continue current meds  Continue Home PT, OT, and home health nursing visits   10 Things You Can Do to Manage Your COVID-19 Symptoms at Home If you have possible or confirmed COVID-19: 1. Stay home from work and school. And stay away from other public places. If you must go out, avoid using any kind of public transportation, ridesharing, or taxis. 2. Monitor your symptoms carefully. If your symptoms get worse, call your healthcare provider immediately. 3. Get rest and stay hydrated. 4. If you have a medical appointment, call the healthcare provider ahead of time and tell them that you have or may have COVID-19. 5. For medical emergencies, call 911 and notify the dispatch personnel that you have or may have COVID-19. 6. Cover your cough and sneezes with a tissue or use the inside of your elbow. 7. Wash your hands often with soap and water for at least 20 seconds or clean your hands with an alcohol-based hand sanitizer that contains at least 60% alcohol. 8. As much as possible, stay in a specific room and away from other people in your home. Also, you should use a separate bathroom, if available. If you need to be around other people in  or outside of the home, wear a mask. 9. Avoid sharing personal items with other people in your household, like dishes, towels, and bedding. 10. Clean all surfaces that are touched often, like counters, tabletops, and doorknobs. Use household cleaning sprays or wipes according to the label instructions. [email or web address] 11/08/2018 This information is not intended to replace advice given to you by your health care provider. Make sure you discuss any questions you have with your health care provider. Document Revised: 04/12/2019 Document Reviewed: 04/12/2019 Elsevier Patient Education  2020 [place], [place] 07/12/2019

## 2019-07-11 NOTE — Patient Instructions (Addendum)
Medication Instructions:  Your physician recommends that you continue on your current medications as directed. Please refer to the Current Medication list given to you today.  *If you need a refill on your cardiac medications before your next appointment, please call your pharmacy*   Lab Work: None ordered  If you have labs (blood work) drawn today and your tests are completely normal, you will receive your results only by: Marland Kitchen MyChart Message (if you have MyChart) OR . A paper copy in the mail If you have any lab test that is abnormal or we need to change your treatment, we will call you to review the results.   Testing/Procedures: None ordered   Follow-Up: At [facility], you and your health needs are our priority.  As part of our continuing mission to provide you with exceptional heart care, we have created designated Provider Care Teams.  These Care Teams include your primary Cardiologist (physician) and Advanced Practice Providers (APPs -  Physician Assistants and Nurse Practitioners) who all work together to provide you with the care you need, when you need it.  We recommend signing up for the patient portal called "MyChart".  Sign up information is provided on this After Visit Summary.  MyChart is used to connect with patients for Virtual Visits (Telemedicine).  Patients are able to view lab/test results, encounter notes, upcoming appointments, etc.  Non-urgent messages can be sent to your provider as well.   To learn more about what you can do with MyChart, go to [email or web address].    Your next appointment:   6 month(s)  The format for your next appointment:   In Person  Provider:   You may see Lauree Chandler, MD or one of the following Advanced Practice Providers on your designated Care Team:    Melina Copa, PA-C  Ermalinda Barrios, PA-C    Other Instructions Call and let us know if your heart rate is greater than 100  Call and make an appointment in the  [facility] at [phone] or [phone]

## 2019-07-12 ENCOUNTER — Ambulatory Visit (HOSPITAL_COMMUNITY)
Admission: RE | Admit: 2019-07-12 | Discharge: 2019-07-12 | Disposition: A | Discharge status: Home / Self Care | Payer: Medicare Other | Source: Ambulatory Visit | Attending: Nurse Practitioner | Admitting: Nurse Practitioner

## 2019-07-12 ENCOUNTER — Ambulatory Visit (INDEPENDENT_AMBULATORY_CARE_PROVIDER_SITE_OTHER): Payer: Medicare Other | Admitting: Nurse Practitioner

## 2019-07-12 ENCOUNTER — Other Ambulatory Visit: Payer: Self-pay

## 2019-07-12 ENCOUNTER — Telehealth: Payer: Self-pay | Admitting: Nurse Practitioner

## 2019-07-12 VITALS — BP 118/80 | Wt 221.0 lb

## 2019-07-12 DIAGNOSIS — Z8616 Personal history of COVID-19: Secondary | ICD-10-CM | POA: Diagnosis not present

## 2019-07-12 DIAGNOSIS — J1282 Pneumonia due to coronavirus disease 2019: Secondary | ICD-10-CM | POA: Insufficient documentation

## 2019-07-12 DIAGNOSIS — E1151 Type 2 diabetes mellitus with diabetic peripheral angiopathy without gangrene: Secondary | ICD-10-CM

## 2019-07-12 DIAGNOSIS — J9601 Acute respiratory failure with hypoxia: Secondary | ICD-10-CM

## 2019-07-12 DIAGNOSIS — IMO0002 Reserved for concepts with insufficient information to code with codable children: Secondary | ICD-10-CM

## 2019-07-12 DIAGNOSIS — R630 Anorexia: Secondary | ICD-10-CM

## 2019-07-12 DIAGNOSIS — U071 COVID-19: Secondary | ICD-10-CM | POA: Insufficient documentation

## 2019-07-12 DIAGNOSIS — E1165 Type 2 diabetes mellitus with hyperglycemia: Secondary | ICD-10-CM

## 2019-07-12 NOTE — Telephone Encounter (Signed)
Called patient on home and mobile number for [place] clinic appoinment. Phone rang once and went to voicemail

## 2019-07-12 NOTE — Assessment & Plan Note (Addendum)
Assessment: Patient is doing well overall, but O2 sats have recently been dropping with exertion. Patient walked in office today - O2 sats dropped to 86% on room air, Sats returned to 95% with 4L Will order portable O2 to wear at 4 L with exertion  Plan: Patient Instructions  Will order portable O2 to wear at 4L with exertion - room air with rest through [place] Will place referral to pulmonary Will order chest x ray and will call with results Continue combivent inaler as needed

## 2019-07-12 NOTE — Assessment & Plan Note (Signed)
Please keep follow up as scheduled with Endocrinology for diabetes management.

## 2019-07-12 NOTE — Patient Instructions (Addendum)
Will order portable O2 to wear at 4L [place] with exertion - room air with rest through [place] Will place referral to pulmonary Will order chest x ray and will call with results Continue combivent inaler as needed Continue Incentive Spirometry and Flutter  Keep follow-up with cardiology  Keep follow-up for diabetic management with endocrinology  Hand out given  - guide to covid-19 nutritional rehabilitation - Diet high in protein and nutritional supplements to help with recovery  Continue current meds  Continue Home PT, OT, and home health nursing visits   10 Things You Can Do to Manage Your COVID-19 Symptoms at Home If you have possible or confirmed COVID-19: 1. Stay home from work and school. And stay away from other public places. If you must go out, avoid using any kind of public transportation, ridesharing, or taxis. 2. Monitor your symptoms carefully. If your symptoms get worse, call your healthcare provider immediately. 3. Get rest and stay hydrated. 4. If you have a medical appointment, call the healthcare provider ahead of time and tell them that you have or may have COVID-19. 5. For medical emergencies, call 911 and notify the dispatch personnel that you have or may have COVID-19. 6. Cover your cough and sneezes with a tissue or use the inside of your elbow. 7. Wash your hands often with soap and water for at least 20 seconds or clean your hands with an alcohol-based hand sanitizer that contains at least 60% alcohol. 8. As much as possible, stay in a specific room and away from other people in your home. Also, you should use a separate bathroom, if available. If you need to be around other people in or outside of the home, wear a mask. 9. Avoid sharing personal items with other people in your household, like dishes, towels, and bedding. 10. Clean all surfaces that are touched often, like counters, tabletops, and doorknobs. Use household cleaning sprays or wipes according to the  label instructions. [email or web address] 11/08/2018 This information is not intended to replace advice given to you by your health care provider. Make sure you discuss any questions you have with your health care provider. Document Revised: 04/12/2019 Document Reviewed: 04/12/2019 Elsevier Patient Education  [place].

## 2019-07-12 NOTE — Assessment & Plan Note (Addendum)
Assessment: Patient noted to have crackles to bilateral bases.   Plan:  Will place referral to pulmonary Will order chest x ray and will call with results Continue combivent inaler as needed Patient is currently finishing prescription for azithromycin ordered by PCP earlier this week. Please finish entire course.

## 2019-07-13 NOTE — Progress Notes (Signed)
Spoke with patient wife. Verbally understood results. Had no additional questions.   Patient did receive oxygen take but they were unable to get portable tank per [employer] that tank can only be up to 3L due to battery life.   Pulmonary Appointment 3/17 will be in [place]. [place] appointments were too far out.

## 2019-07-20 ENCOUNTER — Telehealth: Payer: Medicare Other | Admitting: Physician Assistant

## 2019-07-24 ENCOUNTER — Other Ambulatory Visit: Payer: Self-pay

## 2019-07-25 ENCOUNTER — Encounter: Payer: Self-pay | Admitting: Internal Medicine

## 2019-07-25 ENCOUNTER — Ambulatory Visit (INDEPENDENT_AMBULATORY_CARE_PROVIDER_SITE_OTHER): Payer: Medicare Other

## 2019-07-25 ENCOUNTER — Ambulatory Visit: Payer: Medicare Other | Admitting: Internal Medicine

## 2019-07-25 ENCOUNTER — Other Ambulatory Visit: Payer: Self-pay

## 2019-07-25 DIAGNOSIS — Z933 Colostomy status: Secondary | ICD-10-CM

## 2019-07-25 DIAGNOSIS — J9601 Acute respiratory failure with hypoxia: Secondary | ICD-10-CM

## 2019-07-25 DIAGNOSIS — J1282 Pneumonia due to coronavirus disease 2019: Secondary | ICD-10-CM

## 2019-07-25 DIAGNOSIS — U071 COVID-19: Secondary | ICD-10-CM

## 2019-07-25 MED ORDER — IPRATROPIUM-ALBUTEROL 20-100 MCG/ACT IN AERS: INHALATION_SPRAY | RESPIRATORY_TRACT | 0 refills | Status: DC

## 2019-07-25 NOTE — Progress Notes (Signed)
Spoke with [name], pt's spouse and notified of results/recs and she verbalized understanding  I have pt set up for 3 month rov with cxr.

## 2019-07-25 NOTE — Patient Instructions (Addendum)
Only use your combivent  as a rescue medication to be used if you can't catch your breath by resting or doing a relaxed purse lip breathing pattern.  - The less you use it, the better it will work when you need it. - Ok to use up to 1 puffs  every 4 hours if you must but call for immediate appointment if use goes up over your usual need - Don't leave home without it !!  (think of it like the spare tire for your car)   Try combivent  15 min before an activity that you know would make you short of breath and see if it makes any difference and if makes none then don't take it after activity unless you can't catch your breath.  Make sure you check your oxygen saturations at highest level of activity to be sure it stays over 90% let me know if trending down over time but should gradually improve even at highest level of activity as lungs fully recover (typically closer to 95%)   Please remember to go to the x-ray department   for your tests - we will call you with the results when they are available.  Pulmonary follow up is as needed

## 2019-07-25 NOTE — Assessment & Plan Note (Addendum)
See admit 06/11/19  - no desats walking 07/25/2019 with mild / mod residual Fibropoliferative pattern   07/25/2019  After extensive coaching inhaler device,  effectiveness =    75% so ok to just use combivent q 4 h prn at this point     As long as his ex tol and sats gradually normalize over the next 3 months pulmonary f/u can be prn

## 2019-07-25 NOTE — Assessment & Plan Note (Addendum)
Dx at covid 19 admit  06/11/19  - 07/25/2019   Walked RA x two laps =  approx 553ft @ moderate (his nl) pace - stopped due to end of study s sob  with sats of 92 % at the end of the study > 02 discontinued  Clinically he had no significant underlying lung dz prior to covid 19 pna and would not really expect him to have it now so no need for 02 or pulmonary f/u at this point.          Each maintenance medication was reviewed in detail including emphasizing most importantly the difference between maintenance and prns and under what circumstances the prns are to be triggered using an action plan format where appropriate.  Total time for H and P, chart review, counseling,   directly observing portions of ambulatory 02 saturation study/  and generating customized AVS unique to this office visit / charting = 45 min

## 2019-07-25 NOTE — Progress Notes (Signed)
[name], male    DOB: 08/19/1945,    MRN: [id number]   Brief patient profile:  74 yowm quit smoking 05/11/1999 due to heart dz no resp symptoms   Then acutely ill with COVID 19:   Admit date: 06/11/2019 Discharge date: 06/16/2019        Covid pneumonia/acute respiratory failure with hypoxia COVID-19 Labs  Recent Labs (last 2 labs)        Recent Labs    06/14/19 0810 06/15/19 0525 06/16/19 0340  DDIMER 4.45* 5.83* 11.97*  FERRITIN 813* 789* 803*  CRP 4.2* 2.8* 11.7*      Recent Labs       Lab Results  Component Value Date   SARSCOV2NAA POSITIVE (A) 06/11/2019     -Decadron6 mg daily -Remdesivirper pharmacy protocol(end date 2/6) -2/2 transfuse 1 unit Covid convalescent plasma -Vitamins per pharmacy protocol -Flutter valve -Incentive spirometry -Combivent -Titrate O2 to maintain SPO2> 88% SATURATION QUALIFICATIONS: (Thisnote is usedto comply with regulatory documentation for home oxygen) Patient Saturations on Room Air at Rest =86% Patient Saturations on6Liters of oxygen while Ambulating = 81-90% Please briefly explain why patient needs home oxygen: Pt O2 sats less than 88% on RA at rest. Pt has been on 4-6L HFNC during the day. Pt ambulated on 6L and sats ranged from 81-90%. Pt now sitting in chair on 4L, sats in the low 90's. -Patient meets criteria for home O2 -6 L O2 via [place] titrate to maintain SPO2>88% -ProvideInogenhome portable O2 concentrator -Patient has been counseled that he will be considered contagious until 2/21.  Patient will need to take appropriate precautions i.e. wearing of mask, quarantine as much as possible, washing hands frequently, wiping down surfaces with disinfectant frequently etc. -Follow-up with PCP in 3 weeks   A. fib with RVR -Currently NSR -Metoprolol 12.5 mg BID -Continue Xarelto  Essential HTN -See A. fib RVR  CAD of native vessels -Lipitor 20 mg daily -2/4 LDL = 67  Uncontrolled diabetes  type 2 with complication -2/2 hemoglobin A1c= 7.3 -2/4 increase Levemir 20 units BID -2/4 increase NovoLog 10 units qac   Acute kidney injury (baseline Cr 1.3) -Hold ACE inhibitor.  PCP to decide when/if to restart Last Labs          Recent Labs  Lab 06/11/19 2050 06/13/19 0259 06/14/19 0810 06/15/19 0525 06/16/19 0340  CREATININE 1.68* 1.57* 1.42* 1.33* 1.32*    -At baseline        Discharge Diagnoses:  Active Problems:   Hyperlipidemia   HYPERTENSION, BENIGN   CAD, NATIVE VESSEL   Uncontrolled type 2 diabetes mellitus with circulatory disorder   A-fib (HCC)   Chronic anticoagulation   Suspected COVID-19 virus infection   COVID-19 virus infection   Pneumonia due to COVID-19 virus   Acute respiratory failure with hypoxia (HCC)   AKI (acute kidney injury) ([place])   Discharge Condition: Stable  Diet recommendation: Carb modified     Filed Weights   06/11/19 1940  Weight: 108.9 kg    History of present illness:  74 y.o. WM PMHx diabetes mellitus type 2, chronic kidney disease stage III, CAD hypertension atrial fibrillation on Xareltos/p Colectomy for Colon cancer   Presents with shortness of breath, he went to his PCP and was found to be satting 85% on room air he said is gotten progressively worse over the last 5 days  Hospital Course:  During his hospitalization patient treated for Covid pneumonia/acute respiratory failure with hypoxia.  Patient responded well to  treatment however will need to go home on oxygen.      History of Present Illness  07/25/2019  Pulmonary/ 1st office eval/Yajaira Doffing  Chief Complaint  Patient presents with  . Consult    post covid positive 06/11/19, shortness of breath after his 1 mile walk,    Dyspnea:  Can walk a half mile s stopping pace is little slower only checks 02 after sits  Cough: none  Sleep: sleeps flat, 2 pillows  SABA use: none  No obvious day to day or daytime variability or assoc excess/ purulent  sputum or mucus plugs or hemoptysis or cp or chest tightness, subjective wheeze or overt sinus or hb symptoms.   Sleeping now without nocturnal  or early am exacerbation  of respiratory  c/o's or need for noct saba. Also denies any obvious fluctuation of symptoms with weather or environmental changes or other aggravating or alleviating factors except as outlined above   No unusual exposure hx or h/o childhood pna/ asthma or knowledge of premature birth.  Current Allergies, Complete Past Medical History, Past Surgical History, Family History, and Social History were reviewed in [employer] record.  ROS  The following are not active complaints unless bolded Hoarseness, sore throat, dysphagia, dental problems, itching, sneezing,  nasal congestion or discharge of excess mucus or purulent secretions, ear ache,   fever, chills, sweats, unintended wt loss or wt gain, classically pleuritic or exertional cp,  orthopnea pnd or arm/hand swelling  or leg swelling, presyncope, palpitations, abdominal pain, anorexia, nausea, vomiting, diarrhea  or change in bowel habits or change in bladder habits, change in stools or change in urine, dysuria, hematuria,  rash, arthralgias, visual complaints, headache, numbness, weakness or ataxia or problems with walking or coordination,  change in mood or  memory.           Past Medical History:  Diagnosis Date  . AAA (abdominal aortic aneurysm) ([place])   . Allergy   . Atrial fibrillation [facility]) October 2013   Xarelto started; duration unknown  . Cataract   . Chronic kidney disease    Stage III   . Coronary artery disease    post bare-metal stent to LAD in 2001  . Diabetes mellitus    type 2   . Hypertension   . Myocardial infarct ([place]) 2001  . Obesity     Outpatient Medications Prior to Visit  Medication Sig Dispense Refill  . acetaminophen (TYLENOL) 500 MG tablet Take 1,000 mg by mouth every 6 (six) hours as needed for mild pain, fever or  headache.    Marland Kitchen ascorbic acid (VITAMIN C) 500 MG tablet Take 1 tablet (500 mg total) by mouth daily. 30 tablet 0  . atorvastatin (LIPITOR) 20 MG tablet Take 20 mg by mouth at bedtime.     . Dulaglutide (TRULICITY) 3 0000000 SOPN Inject 1 Dose into the skin once a week.    Marland Kitchen guaiFENesin-dextromethorphan (ROBITUSSIN DM) 100-10 MG/5ML syrup Take 10 mLs by mouth every 4 (four) hours as needed for cough. 118 mL 0  . insulin NPH-regular Human (70-30) 100 UNIT/ML injection Inject 100 Units into the skin in the morning and at bedtime.    . Insulin Pen Needle 29G X 12MM MISC Use as directed 200 each 0  . Ipratropium-Albuterol (COMBIVENT) 20-100 MCG/ACT AERS respimat Inhale 1 puff into the lungs 4 (four) times daily. (Patient taking differently: Inhale 1 puff into the lungs in the morning and at bedtime. ) 4 g 0  .  metoprolol tartrate (LOPRESSOR) 25 MG tablet Take 0.5 tablets (12.5 mg total) by mouth 2 (two) times daily. 30 tablet 0  . nitroGLYCERIN (NITROSTAT) 0.4 MG SL tablet Place 0.4 mg under the tongue every 5 (five) minutes as needed for chest pain.     Marland Kitchen ondansetron (ZOFRAN) 4 MG tablet Take 1 tablet (4 mg total) by mouth every 6 (six) hours as needed for nausea. 20 tablet 0  . rivaroxaban (XARELTO) 20 MG TABS tablet Take 1 tablet (20 mg total) by mouth daily with supper. 90 tablet 0  .     0  . omeprazole (PRILOSEC) 20 MG capsule Take 20 mg by mouth daily as needed (for acid reflux/indigestion).         Objective:     BP 128/78 (BP Location: Left Arm, Patient Position: Sitting, Cuff Size: Normal)   Pulse 95   Temp (!) 97 F (36.1 C) (Temporal)   Ht 6' (1.829 m)   Wt 219 lb 6.4 oz (99.5 kg)   SpO2 94%   BMI 29.76 kg/m   SpO2: 94 %      Pleasant elderly wm nad but very hard of hearing    HEENT : pt wearing mask not removed for exam due to covid -19 concerns.    NECK :  without JVD/Nodes/TM/ nl carotid upstrokes bilaterally   LUNGS: no acc muscle use,  Nl contour chest which is  clear to A and P bilaterally without cough on insp or exp maneuvers   CV:  IRIR   no s3 or murmur or increase in P2, and no edema   ABD:  soft and nontender with nl inspiratory excursion in the supine position. No bruits or organomegaly appreciated, bowel sounds nl  MS:  Nl gait/ ext warm without deformities, calf tenderness, cyanosis or clubbing No obvious joint restrictions   SKIN: warm and dry without lesions    NEURO:  alert, approp, nl sensorium with  no motor or cerebellar deficits apparent.      CXR PA and Lateral:   07/25/2019 :    I personally reviewed images and agree with radiology impression as follows:   Serial improvement still has background Fibroproliferative pattern R > L with blunting R CP angle         Assessment  Pneumonia due to COVID-19 virus See admit 06/11/19  - no desats walking 07/25/2019 with mild / mod residual Fibropoliferative pattern   07/25/2019  After extensive coaching inhaler device,  effectiveness =    75% so ok to just use combivent q 4 h prn at this point   As long as his ex tol and sats gradually normalize over the next 3 months pulmonary f/u can be prn      Acute respiratory failure with hypoxia ([place]) Dx at covid 19 admit  06/11/19  - 07/25/2019   Walked RA x two laps =  approx 564ft @ moderate (his nl) pace - stopped due to end of study s sob  with sats of 92 % at the end of the study > 02 discontinued  Clinically he had no significant underlying lung dz prior to covid 19 pna and would not really expect him to have it now so no need for 02 or pulmonary f/u at this point.      Each maintenance medication was reviewed in detail including emphasizing most importantly the difference between maintenance and prns and under what circumstances the prns are to be triggered using an action plan format  where appropriate.  Total time for H and P, chart review, counseling,   directly observing portions of ambulatory 02 saturation study/  and  generating customized AVS unique to this office visit / charting = 45 min        Christinia Gully, MD 07/25/2019

## 2019-08-06 ENCOUNTER — Other Ambulatory Visit: Payer: Self-pay

## 2019-08-07 ENCOUNTER — Ambulatory Visit: Payer: Medicare Other | Admitting: Internal Medicine

## 2019-08-07 ENCOUNTER — Encounter: Payer: Self-pay | Admitting: Internal Medicine

## 2019-08-07 VITALS — BP 122/80 | HR 106 | Ht 72.0 in | Wt 224.0 lb

## 2019-08-07 DIAGNOSIS — E1151 Type 2 diabetes mellitus with diabetic peripheral angiopathy without gangrene: Secondary | ICD-10-CM | POA: Diagnosis not present

## 2019-08-07 DIAGNOSIS — E7800 Pure hypercholesterolemia, unspecified: Secondary | ICD-10-CM

## 2019-08-07 DIAGNOSIS — E663 Overweight: Secondary | ICD-10-CM | POA: Diagnosis not present

## 2019-08-07 DIAGNOSIS — IMO0002 Reserved for concepts with insufficient information to code with codable children: Secondary | ICD-10-CM

## 2019-08-07 DIAGNOSIS — E1165 Type 2 diabetes mellitus with hyperglycemia: Secondary | ICD-10-CM

## 2019-08-07 LAB — POCT GLYCOSYLATED HEMOGLOBIN (HGB A1C): Hemoglobin A1C: 6.6 % — AB (ref 4.0–5.6)

## 2019-08-07 MED ORDER — INSULIN PEN NEEDLE 32G X 4 MM MISC: 3 refills | Status: DC

## 2019-08-07 NOTE — Patient Instructions (Addendum)
Please continue: - Trulicity 3 mg weekly  Please increase: - 70/30 insulin to 32 units before b'fast and 36 units before dinner  Please return in 4 months with your sugar log.

## 2019-08-07 NOTE — Progress Notes (Signed)
Patient ID: [name], male   DOB: Sep 07, 1945, 74 y.o.   MRN: [id number]  This visit occurred during the SARS-CoV-2 public health emergency.  Safety protocols were in place, including screening questions prior to the visit, additional usage of staff PPE, and extensive cleaning of exam room while observing appropriate contact time as indicated for disinfecting solutions.   HPI: RICKO MACDONELL is a 74 y.o.-year-old male, returning for f/u for DM2, dx 1995, insulin-dependent, uncontrolled, with complications (CKD stage 3, CAD - AMI, s/p stent LAD 2001). Last visit 4 months ago.  He is here with his wife, who is also my patient, and offers part of the history especially regarding to his blood sugars, insulin doses, and past medical history.  He had [id number] syndrome complicated with pneumonia in 05/2019.  He was admitted at that time and was also found to be in A. fib with RVR.  He was given the remdesivir, convalescent plasma, dexamethasone.  He is also on Xarelto.  On discharge, his premixed insulin regimen was changed to basal-bolus insulin regimen:  Levemir 20 units 2x a day, Novolog 6 units 3x a day before meals. He felt this was not helping his CBGs and it was also expensive >> now back on 70/30 insulin 32 units 2x a day.  Reviewed HbA1c levels: Lab Results  Component Value Date   HGBA1C 7.3 (H) 06/12/2019   HGBA1C 6.5 (A) 03/28/2019   HGBA1C 7.5 (A) 11/24/2018   He is on: - Trulicity 1.5  mg weekly >> was off after his hospitalization and also because this was out of stock at [facility] - restarted 1 mo ago: 3 mg - Insulin 70/30 insulin 32 units 2x a day. He was on the insulin 70/30 before admission for COVID-19 + pneumonia in 05/2019. We tried low-dose metformin ER several times, but he could not tolerate it due to diarrhea >> now diarrhea is better He was on Tanzeum 50 mg weekly (started 09/2015) - >200$ per month! >> Trulicity 1.5 mg weekly He was previously on glipizide-metformin but  could not tolerate it due to diarrhea -stopped 08/2017  Pt checks his sugars - 2 times a day: - am: 109, 131-179 >> 117, 146-186 >> 106-183, 198, 218 >> 133-163, 178 - 2h after b'fast: 131 >> n/c >> 223 >> n/c  - before lunch: 198, 215 >> n/c - 2h after lunch:n/c >> 191-228 >> 202, 237 >> n/c - before dinner: 107, 138-228 >> 111-203, 220, 241 >> 93-167, 187 - 2h after dinner:  n/c >> 176 >> 196 >> n/c - bedtime:76, 181 >> 212 >> 142-203 >> 132, 150 >> n/c - nighttime:43-67, 156 >> 74-109 >> 90, 120 >> n/c Lowest sugar was 59 at bedtime >> .Marland Kitchen. 107 >> 106; he does not have hypoglycemia awareness.! Highest sugar was 228 >> 237 >> 241.  Pt's meals are: - Breakfast: eggs, toast, ham/sausage or cereals + milk - Lunch: sandwich, vegetables, meat - Dinner: seafood, meat, vegetables - Snacks: nabs   Reviewed labs from PCP: 04/30/2019: Lipids: 146/55/49/85 CMP normal, except BUN/creatinine 18/1.6, GFR 42.  His glucose was 84. PSA 0.6  04/26/2018: CMP normal with the exception of a slightly high glucose of 124, BUN/creatinine 19/1.5, GFR 46 and slightly higher chloride, of 107. Lipids: 147/58/47/88 CBC with slightly low red blood cells of 4.08 (4.14-5.8), normal hemoglobin of 13.3 and slightly high MCV of 99 (79-97) PSA 0.8  04/13/2017: - Lipids: 124/65/39/72 - CMP normal, glucose 98, BUN/creatinine 10/1.3, GFR 55,  potassium 3.4 (3.5-5.2), Ca 8.1 (8.6-10.2) - CBC with differential: low Hb 11.3 (13-17.7), low RBC 3.57 (4.14-5.8) - ferritin 167 - PSA 0.5  09/30/2015: - Hep C virus antibody <0.1 - Lipids: 126/68/44/68 - CMP normal, except glucose 209, BUN/creatinine 22/1.39, GFR 51, potassium 5.4 (3.5-5.2), CO2 17 (18-29) - CBC with differential normal - B12 vitamin 305 [id number])  04/01/2015: - ACR 56.3 - CBC with differential normal - Lipids: 116/56/42/63 - CMP: Glucose 193, BUN/creatinine 21/1.23, GFR 60, LFTs normal - PSA 0.5    -+ CKD stage III: Lab Results  Component  Value Date   BUN 29 (H) 06/16/2019   CREATININE 1.32 (H) 06/16/2019  On Neurontin.. -He has hyperlipidemia.  He is on Lipitor.  - last eye exam was on 05/21/2019: No DR -No numbness and tingling in his feet.   He has a history of diverticulitis (sees Dr. Shavontae Gibeault Gong).  He had a colectomy in 05/2017 and had to have a colostomy bag.  He was feeling better after his colectomy.  He had hernia repair surgery and reversal of his ostomy 03/08/2018.  ROS: Constitutional: no weight gain/+ weight loss, no fatigue, no subjective hyperthermia, no subjective hypothermia Eyes: no blurry vision, no xerophthalmia ENT: no sore throat, no nodules palpated in neck, no dysphagia, no odynophagia, no hoarseness Cardiovascular: no CP/+ SOB/no palpitations/no leg swelling Respiratory: no cough/+ SOB/no wheezing Gastrointestinal: no N/no V/no D/no C/no acid reflux Musculoskeletal: no muscle aches/no joint aches Skin: no rashes, no hair loss Neurological: no tremors/no numbness/no tingling/no dizziness  I reviewed pt's medications, allergies, PMH, social hx, family hx, and changes were documented in the history of present illness. Otherwise, unchanged from my initial visit note.  Past Medical History:  Diagnosis Date  . AAA (abdominal aortic aneurysm) ([place])   . Allergy   . Atrial fibrillation [facility]) October 2013   Xarelto started; duration unknown  . Cataract   . Chronic kidney disease    Stage III   . Coronary artery disease    post bare-metal stent to LAD in 2001  . Diabetes mellitus    type 2   . Hypertension   . Myocardial infarct ([place]) 2001  . Obesity    Past Surgical History:  Procedure Laterality Date  . CARDIAC CATHETERIZATION  2006   stable with primarily nonobstructive disease  . CARDIOVERSION  03/27/2012   Procedure: CARDIOVERSION;  Surgeon: Carlena Bjornstad, MD;  Location: [facility] ENDOSCOPY;  Service: Cardiovascular;  Laterality: N/A;  . COLOSTOMY TAKEDOWN N/A 03/08/2018   Procedure:  LAPAROSCOPIC HARTMANNS REVERSAL;  Surgeon: Ileana Roup, MD;  Location: [facility] ORS;  Service: General;  Laterality: N/A;  . CORONARY ANGIOPLASTY WITH STENT PLACEMENT  2001   bare-metal stent to the LAD  . CYSTOSCOPY W/ URETERAL STENT PLACEMENT Bilateral 03/08/2018   Procedure: CYSTOSCOPY WITH RETROGRADE AND BILATERAL URETERAL STENT PLACEMENT;  Surgeon: Lucas Mallow, MD;  Location: [facility] ORS;  Service: Urology;  Laterality: Bilateral;  . CYSTOSCOPY WITH STENT PLACEMENT Bilateral 06/03/2017   Procedure: CYSTOSCOPY WITH STENT PLACEMENT;  Surgeon: Festus Aloe, MD;  Location: [facility] ORS;  Service: Urology;  Laterality: Bilateral;  . FLEXIBLE SIGMOIDOSCOPY N/A 03/08/2018   Procedure: FLEXIBLE SIGMOIDOSCOPY;  Surgeon: Ileana Roup, MD;  Location: [facility];  Service: General;  Laterality: N/A;  . HERNIA REPAIR  2006  . LAPAROSCOPIC PARASTOMAL HERNIA N/A 03/08/2018   Procedure: PARASTOMAL HERNIA REPAIR;  Surgeon: Ileana Roup, MD;  Location: [facility];  Service: General;  Laterality: N/A;  . LAPAROSCOPIC SIGMOID  COLECTOMY N/A 06/03/2017   Procedure: LAPAROSCOPIC  SIGMOID COLECTOMY, COLOSTOMY;  Surgeon: Ileana Roup, MD;  Location: [facility];  Service: General;  Laterality: N/A;  . SKIN GRAFT  1981   Social History Main Topics  . Smoking status: Former Smoker -- 0.50 packs/day for 40 years    Types: Cigarettes, Pipe, Cigars    Quit date: 05/11/1999  . Smokeless tobacco: Never Used  . Alcohol Use: No  . Drug Use: No   Social History Narrative   Married    Retired: [employer]   3 grown children   Current Outpatient Medications on File Prior to Visit  Medication Sig Dispense Refill  . acetaminophen (TYLENOL) 500 MG tablet Take 1,000 mg by mouth every 6 (six) hours as needed for mild pain, fever or headache.    Marland Kitchen ascorbic acid (VITAMIN C) 500 MG tablet Take 1 tablet (500 mg total) by mouth daily. 30 tablet 0  . atorvastatin (LIPITOR) 20 MG tablet Take 20 mg by mouth at bedtime.      . Dulaglutide (TRULICITY) 3 0000000 SOPN Inject 1 Dose into the skin once a week.    Marland Kitchen guaiFENesin-dextromethorphan (ROBITUSSIN DM) 100-10 MG/5ML syrup Take 10 mLs by mouth every 4 (four) hours as needed for cough. 118 mL 0  . insulin NPH-regular Human (70-30) 100 UNIT/ML injection Inject 100 Units into the skin in the morning and at bedtime.    . Insulin Pen Needle 29G X 12MM MISC Use as directed 200 each 0  . Ipratropium-Albuterol (COMBIVENT) 20-100 MCG/ACT AERS respimat On puff up to every 4 hours only if needed for breathing. 4 g 0  . metoprolol tartrate (LOPRESSOR) 25 MG tablet Take 0.5 tablets (12.5 mg total) by mouth 2 (two) times daily. 30 tablet 0  . nitroGLYCERIN (NITROSTAT) 0.4 MG SL tablet Place 0.4 mg under the tongue every 5 (five) minutes as needed for chest pain.     Marland Kitchen omeprazole (PRILOSEC) 20 MG capsule Take 20 mg by mouth daily as needed (for acid reflux/indigestion).     . ondansetron (ZOFRAN) 4 MG tablet Take 1 tablet (4 mg total) by mouth every 6 (six) hours as needed for nausea. 20 tablet 0  . rivaroxaban (XARELTO) 20 MG TABS tablet Take 1 tablet (20 mg total) by mouth daily with supper. 90 tablet 0   No current facility-administered medications on file prior to visit.   No Known Allergies Family History  Problem Relation Age of Onset  . Heart disease Father   . Heart attack Father   . ALS Mother   . Diabetes Sister   . Hypertension Sister   . Heart failure Brother   . Stroke Neg Hx    PE: BP 122/80   Pulse (!) 106   Ht 6' (1.829 m)   Wt 224 lb (101.6 kg)   SpO2 95%   BMI 30.38 kg/m  Body mass index is 30.38 kg/m. Wt Readings from Last 3 Encounters:  08/07/19 224 lb (101.6 kg)  07/25/19 219 lb 6.4 oz (99.5 kg)  07/12/19 221 lb (100.2 kg)   Constitutional: overweight, in NAD Eyes: PERRLA, EOMI, no exophthalmos ENT: moist mucous membranes, no thyromegaly, no cervical lymphadenopathy Cardiovascular: RRR, No MRG Respiratory: CTA B Gastrointestinal:  abdomen soft, NT, ND, BS+ Musculoskeletal: no deformities, strength intact in all 4 Skin: moist, warm, no rashes Neurological: no tremor with outstretched hands, DTR normal in all 4  ASSESSMENT: 1. DM2, insulin-dependent, uncontrolled, with complications - CAD - CKD stage 3  2. HL  3.  Overweight  PLAN:  1. Patient with longstanding, previously uncontrolled type 2 diabetes, with improved control after starting the GLP-1 receptor agonist in the past.  We continued his premixed regimen.  However, after his admission for COVID-19 in 0000000, he was off Trulicity (this was also out of stock at [place]) and he was switched to a basal-bolus insulin regimen.  We continued this regimen after his discharge.  However, he mentions that this was expensive and also was not lowering his sugars as much as the premixed regimen so he switched back to the 70/30 regimen -Latest HbA1c was reviewed and this was 7.3%, higher than at last visit; at this visit: 6.6% (better) -At this visit, sugars have improved compared to last visit.  CBGs before dinner are lower, mostly at goal, however, his CBGs in the morning are higher than goal.  I advised him to increase his 70/30 insulin dose before dinner.  Otherwise, we will continue his Trulicity at the same dose 100 mg daily.  He tolerates this well. - I suggested to:  Patient Instructions  Please continue: - Trulicity 3 mg weekly  Please increase: - 70/30 insulin to 32 units before b'fast and 36 units before dinner  Please return in 4 months with your sugar log.   - advised to check sugars at different times of the day - 3x a day, rotating check times - advised for yearly eye exams >> he is UTD - return to clinic in 4 months      2. HL -Reviewed latest lipid panel from 04/2019: LDL above goal of less than 70: 146/55/49/85 -Continues Lipitor without side effects  3.  Overweight -He restarted Trulicity after discharge from the hospital.  This is also helping  with weight loss. -He gained 5 pounds before last visit >> 242 lbs.  At today's visit, he weighs much less: 224 lbs (lost 18 pounds since last visit)  Philemon Kingdom, MD PhD [facility] Endocrinology

## 2019-09-30 ENCOUNTER — Other Ambulatory Visit: Payer: Self-pay | Admitting: Internal Medicine

## 2019-10-25 ENCOUNTER — Ambulatory Visit (INDEPENDENT_AMBULATORY_CARE_PROVIDER_SITE_OTHER): Payer: Medicare Other

## 2019-10-25 ENCOUNTER — Other Ambulatory Visit: Payer: Self-pay

## 2019-10-25 ENCOUNTER — Encounter: Payer: Self-pay | Admitting: Primary Care

## 2019-10-25 ENCOUNTER — Ambulatory Visit: Payer: Medicare Other | Admitting: Primary Care

## 2019-10-25 ENCOUNTER — Ambulatory Visit: Payer: Medicare Other | Admitting: Internal Medicine

## 2019-10-25 VITALS — BP 142/76 | HR 77 | Temp 98.3°F | Ht 74.0 in | Wt 232.0 lb

## 2019-10-25 DIAGNOSIS — U071 COVID-19: Secondary | ICD-10-CM | POA: Diagnosis not present

## 2019-10-25 DIAGNOSIS — J1282 Pneumonia due to coronavirus disease 2019: Secondary | ICD-10-CM | POA: Diagnosis not present

## 2019-10-25 NOTE — Progress Notes (Signed)
@Patient  ID: [name], male    DOB: 27-May-1945, 74 y.o.   MRN: [id number]  Chief Complaint  Patient presents with   Follow-up    sob with exertion-same    Referring provider: Fae Pippin  HPI: 74 year old male, former smoker quit in 2001 (20-pack-year history).  Past medical history significant for pneumonia due to COVID-19, acute respiratory failure with hypoxia, A. fib, aneurysm of abdominal vessel, coronary artery disease, hypertension, acute kidney injury, type 2 diabetes.  Patient of Dr. Melvyn Novas, seen for initial consult on 07/25/19. Dx with covid 19 February 1st 2021. Clinically he had no underlying lung disease prior to covid-19 pneumonia. No desaturation on ambulatory walk during March visit.. If exercise tolerance and sats normalize pulmonary fu can be as needed.   10/25/2019 Patient presents today for 3 months follow-up with CXR. Accompanied by his wife. He is doing well, he only reports experiencing shortness of breath with exertion. He has combivent rescue inhaler but he hasn't had to use this recently. CXR 10/25/2019 showed slight decreased prominence of interstitial markings throughout mid-lower lungs bilaterally, likely reflects resolving pneumonia or residual post infection pulmonary fibrosis. Denies fevers, cough, chest tightness, wheezing.   No Known Allergies  Immunization History  Administered Date(s) Administered   Fluad Quad(high Dose 65+) 02/08/2019   Influenza, High Dose Seasonal PF 02/12/2015, 02/12/2015, 02/11/2018   Influenza-Unspecified 02/06/2014, 02/23/2016   Pneumococcal Conjugate-13 03/26/2014   Pneumococcal Polysaccharide-23 10/18/2009   Td 03/18/2009   Zoster 02/07/2013   Zoster Recombinat (Shingrix) 06/11/2018, 12/04/2018    Past Medical History:  Diagnosis Date   AAA (abdominal aortic aneurysm) ([place])    Allergy    Atrial fibrillation [facility]) October 2013   Xarelto started; duration unknown   Cataract    Chronic kidney  disease    Stage III    Coronary artery disease    post bare-metal stent to LAD in 2001   Diabetes mellitus    type 2    Hypertension    Myocardial infarct ([place]) 2001   Obesity     Tobacco History: Social History   Tobacco Use  Smoking Status Former Smoker   Packs/day: 0.50   Years: 40.00   Pack years: 20.00   Types: Cigarettes, Pipe, Cigars   Quit date: 05/11/1999   Years since quitting: 20.4  Smokeless Tobacco Never Used   Counseling given: Not Answered   Outpatient Medications Prior to Visit  Medication Sig Dispense Refill   acetaminophen (TYLENOL) 500 MG tablet Take 1,000 mg by mouth every 6 (six) hours as needed for mild pain, fever or headache.     ascorbic acid (VITAMIN C) 500 MG tablet Take 1 tablet (500 mg total) by mouth daily. 30 tablet 0   atorvastatin (LIPITOR) 20 MG tablet Take 20 mg by mouth at bedtime.      insulin NPH-regular Human (70-30) 100 UNIT/ML injection Inject 32-36 Units into the skin in the morning and at bedtime.     Insulin Pen Needle 32G X 4 MM MISC Use 2x a day for insulin 200 each 3   metoprolol tartrate (LOPRESSOR) 25 MG tablet Take 0.5 tablets (12.5 mg total) by mouth 2 (two) times daily. 30 tablet 0   nitroGLYCERIN (NITROSTAT) 0.4 MG SL tablet Place 0.4 mg under the tongue every 5 (five) minutes as needed for chest pain.      omeprazole (PRILOSEC) 20 MG capsule Take 20 mg by mouth daily as needed (for acid reflux/indigestion).      ondansetron (  ZOFRAN) 4 MG tablet Take 1 tablet (4 mg total) by mouth every 6 (six) hours as needed for nausea. 20 tablet 0   rivaroxaban (XARELTO) 20 MG TABS tablet Take 1 tablet (20 mg total) by mouth daily with supper. 90 tablet 0   TRULICITY 3 SH/7.74YO SOPN INJECT THE CONTENTS OF 1  PEN (3MG ) SUBCUTANEOUSLY  WEEKLY AS DIRECTED 6 mL 1   guaiFENesin-dextromethorphan (ROBITUSSIN DM) 100-10 MG/5ML syrup Take 10 mLs by mouth every 4 (four) hours as needed for cough. (Patient not taking: Reported  on 10/25/2019) 118 mL 0   Ipratropium-Albuterol (COMBIVENT) 20-100 MCG/ACT AERS respimat On puff up to every 4 hours only if needed for breathing. (Patient not taking: Reported on 10/25/2019) 4 g 0   No facility-administered medications prior to visit.    Review of Systems  Review of Systems  Constitutional: Negative.   Respiratory: Negative for cough, chest tightness and wheezing.        DOE  Cardiovascular: Negative.     Physical Exam  BP (!) 142/76 (BP Location: Right Arm, Cuff Size: Normal)    Pulse 77    Temp 98.3 F (36.8 C) (Oral)    Ht 6\' 2"  (1.88 m)    Wt 232 lb (105.2 kg)    SpO2 97%    BMI 29.79 kg/m  Physical Exam Constitutional:      Appearance: Normal appearance.  HENT:     Mouth/Throat:     Mouth: Mucous membranes are moist.     Pharynx: Oropharynx is clear.  Cardiovascular:     Rate and Rhythm: Normal rate and regular rhythm.  Pulmonary:     Effort: Pulmonary effort is normal.     Breath sounds: Rales present.  Neurological:     General: No focal deficit present.     Mental Status: He is alert and oriented to person, place, and time. Mental status is at baseline.  Psychiatric:        Mood and Affect: Mood normal.        Behavior: Behavior normal.        Thought Content: Thought content normal.      Lab Results:  CBC    Component Value Date/Time   WBC 5.6 06/16/2019 0340   RBC 3.61 (L) 06/16/2019 0340   HGB 12.2 (L) 06/16/2019 0340   HCT 35.5 (L) 06/16/2019 0340   PLT 142 (L) 06/16/2019 0340   MCV 98.3 06/16/2019 0340   MCH 33.8 06/16/2019 0340   MCHC 34.4 06/16/2019 0340   RDW 13.2 06/16/2019 0340   LYMPHSABS 0.4 (L) 06/16/2019 0340   MONOABS 0.2 06/16/2019 0340   EOSABS 0.0 06/16/2019 0340   BASOSABS 0.0 06/16/2019 0340    BMET    Component Value Date/Time   NA 135 06/16/2019 0340   NA 142 03/28/2014 0000   K 4.6 06/16/2019 0340   CL 102 06/16/2019 0340   CO2 19 (L) 06/16/2019 0340   GLUCOSE 198 (H) 06/16/2019 0340   BUN 29 (H)  06/16/2019 0340   BUN 14 03/28/2014 0000   CREATININE 1.32 (H) 06/16/2019 0340   CALCIUM 8.1 (L) 06/16/2019 0340   GFRNONAA 53 (L) 06/16/2019 0340   GFRAA >60 06/16/2019 0340    BNP No results found for: BNP  ProBNP No results found for: PROBNP  Imaging: DG Chest 2 View  Result Date: 10/25/2019 CLINICAL DATA:  75 year old male with history of COVID-19 pneumonia. Follow-up study. EXAM: CHEST - 2 VIEW COMPARISON:  Chest x-ray 07/25/2019. FINDINGS:  Lung volumes are normal. No consolidative airspace disease. No pleural effusions. Widespread areas of interstitial prominence are noted, most evident throughout the mid to lower lungs bilaterally, improved compared to the prior examination. No pneumothorax. No evidence of pulmonary edema. Heart size is normal. Upper mediastinal contours are within normal limits. Aortic atherosclerosis. IMPRESSION: 1. Slight decreased prominence of interstitial markings throughout the mid to lower lungs bilaterally, likely to reflect resolving pneumonia and some residual post infectious pulmonary fibrosis. Follow-up high-resolution chest CT is suggested to better evaluate these findings if clinically appropriate. 2. Aortic atherosclerosis. Electronically Signed   By: Vinnie Langton M.D.   On: 10/25/2019 10:56     Assessment & Plan:   Pneumonia due to COVID-19 virus - Clinically improved, residual dyspnea on exertion  - CXR today showed decreased prominence of interstitial markings throughout lower lungs bilaterally, likely to reflect resolving pneumonia and some residual post infectious pulmonary fibrosis - Recommendations: Continue Combivent 1 puff every 4 hours as needed only for shortness of breath or wheezing  - Orders: HRCT in 3 months ; Spirometry with DLCO in 3 months (no PFTs on file) - Follow-up: 3-4 months with Dr. Melvyn Novas or APP; if dyspnea/exercise tolerance improves can likely follow-up with pulmonary after as needed only   Martyn Ehrich,  NP 10/25/2019

## 2019-10-25 NOTE — Patient Instructions (Addendum)
CXR today showed decreased prominence of interstitial markings throughout lower lungs bilaterally, likely to reflect resolving pneumonia and some residual post infectious pulmonary fibrosis  Recommendations: Continue Combivent 1 puff every 4 hours as needed only for shortness of breath or wheezing   Orders: HRCT in 3 months  Spirometry with DLCO in 3 months   Follow-up: 4 months with Dr. Melvyn Novas OR sooner if needed

## 2019-10-25 NOTE — Assessment & Plan Note (Addendum)
-   Clinically improved, residual dyspnea on exertion  - CXR today showed decreased prominence of interstitial markings throughout lower lungs bilaterally, likely to reflect resolving pneumonia and some residual post infectious pulmonary fibrosis - Recommendations: Continue Combivent 1 puff every 4 hours as needed only for shortness of breath or wheezing  - Orders: HRCT in 3 months ; Spirometry with DLCO in 3 months (no PFTs on file) - Follow-up: 3-4 months with Dr. Melvyn Novas or APP; if dyspnea/exercise tolerance improves can likely follow-up with pulmonary after as needed only

## 2019-10-31 ENCOUNTER — Other Ambulatory Visit: Payer: Self-pay | Admitting: Internal Medicine

## 2019-12-28 ENCOUNTER — Other Ambulatory Visit: Payer: Self-pay

## 2019-12-28 DIAGNOSIS — I714 Abdominal aortic aneurysm, without rupture, unspecified: Secondary | ICD-10-CM

## 2020-01-01 ENCOUNTER — Ambulatory Visit: Payer: Medicare Other | Admitting: Internal Medicine

## 2020-01-01 ENCOUNTER — Other Ambulatory Visit: Payer: Self-pay

## 2020-01-01 ENCOUNTER — Encounter: Payer: Self-pay | Admitting: Internal Medicine

## 2020-01-01 VITALS — BP 122/80 | HR 85 | Ht 74.0 in | Wt 239.0 lb

## 2020-01-01 DIAGNOSIS — E7800 Pure hypercholesterolemia, unspecified: Secondary | ICD-10-CM | POA: Diagnosis not present

## 2020-01-01 DIAGNOSIS — E663 Overweight: Secondary | ICD-10-CM | POA: Diagnosis not present

## 2020-01-01 DIAGNOSIS — E1165 Type 2 diabetes mellitus with hyperglycemia: Secondary | ICD-10-CM

## 2020-01-01 DIAGNOSIS — E1151 Type 2 diabetes mellitus with diabetic peripheral angiopathy without gangrene: Secondary | ICD-10-CM

## 2020-01-01 DIAGNOSIS — IMO0002 Reserved for concepts with insufficient information to code with codable children: Secondary | ICD-10-CM

## 2020-01-01 LAB — POCT GLYCOSYLATED HEMOGLOBIN (HGB A1C): Hemoglobin A1C: 7 % — AB (ref 4.0–5.6)

## 2020-01-01 MED ORDER — GLIPIZIDE 5 MG PO TABS: 5.0000 mg | ORAL_TABLET | Freq: Two times a day (BID) | ORAL | 3 refills | Status: DC

## 2020-01-01 NOTE — Patient Instructions (Signed)
Please continue: - Trulicity 3 mg weekly - 70/30 insulin 32 units before b'fast and 36 units before dinner  Please add: - Glipizide 5 mg 15-30 min before b'fast and dinner  In 2 weeks, if the sugars have not improved, stop Glipizide and increase insulin doses by 4 units.  Please return in 4 months with your sugar log.

## 2020-01-01 NOTE — Progress Notes (Signed)
Patient ID: [name], male   DOB: May 25, 1945, 74 y.o.   MRN: [id number]  This visit occurred during the SARS-CoV-2 public health emergency.  Safety protocols were in place, including screening questions prior to the visit, additional usage of staff PPE, and extensive cleaning of exam room while observing appropriate contact time as indicated for disinfecting solutions.   HPI: [name] is a 74 y.o.-year-old male, returning for f/u for DM2, dx 1995, insulin-dependent, uncontrolled, with complications (CKD stage 3, CAD - AMI, s/p stent LAD 2001). Last visit 5 months ago.  He is here with his wife, who is also my patient and offers part of the history especially related to his blood sugars, insulin doses and past medical history.  He had IONGE-95 complicated with pneumonia in 05/2019.  He was admitted at that time and was also found to be in A. fib with RVR.  He was given the remdesivir, convalescent plasma, dexamethasone.  He is also on Xarelto.  His Trulicity was held in the hospital.  We restarted this after discharge.  Also, he was discharged from the hospital on a basal-bolus insulin regimen: Levemir 20 units 2x a day, Novolog 6 units 3x a day before meals. He felt this was not helping his CBGs and it was also expensive >> he changed back to 70/30 insulin.  Reviewed HbA1c levels: Lab Results  Component Value Date   HGBA1C 6.6 (A) 08/07/2019   HGBA1C 7.3 (H) 06/12/2019   HGBA1C 6.5 (A) 03/28/2019   He is on: - Trulicity 1.5  mg weekly >> was off after his hospitalization and also because this was out of stock at [facility] -restarted at 3 mg weekly - Insulin 70/30 insulin 32 units 2x a day >> 32 units in a.m. and 36 units before dinner He was on the insulin 70/30 before admission for COVID-19 + pneumonia in 05/2019. We tried low-dose metformin ER several times, but he could not tolerate it due to diarrhea >> now diarrhea is better He was on Tanzeum 50 mg weekly (started 09/2015) - >200$  per month! >> Trulicity 1.5 mg weekly He was previously on glipizide-metformin but could not tolerate it due to diarrhea -stopped 08/2017  Pt checks his sugars twice a day: - am: 106-183, 198, 218 >> 133-163, 178 >> 110, 138-196 - 2h after b'fast: 131 >> n/c >> 223 >> n/c >> 219 - before lunch: 198, 215 >> n/c - 2h after lunch:n/c >> 191-228 >> 202, 237 >> n/c >> 162-202 - before dinner: 111-203, 220, 241 >> 93-167, 187 >> 115, 119, 146-247, 260 - 2h after dinner:  n/c >> 176 >> 196 >> n/c - bedtime: 212 >> 142-203 >> 132, 150 >> n/c - nighttime:43-67, 156 >> 74-109 >> 90, 120 >> n/c >> 105-186 Lowest sugar was 59 at bedtime >> .Marland Kitchen. 107 >> 106 >> 110; he does not have hypoglycemia awareness! Highest sugar was 237 >> 241 >> 260.  Pt's meals are: - Breakfast: eggs, toast, ham/sausage or cereals + milk - Lunch: sandwich, vegetables, meat - Dinner: seafood, meat, vegetables - Snacks: nabs  Reviewed previous labs: 04/30/2019: Lipids: 146/55/49/85 CMP normal, except BUN/creatinine 18/1.6, GFR 42.  His glucose was 84. PSA 0.6  04/26/2018: CMP normal with the exception of a slightly high glucose of 124, BUN/creatinine 19/1.5, GFR 46 and slightly higher chloride, of 107. Lipids: 147/58/47/88 CBC with slightly low red blood cells of 4.08 (4.14-5.8), normal hemoglobin of 13.3 and slightly high MCV of 99 (79-97)  PSA 0.8  04/13/2017: - Lipids: 124/65/39/72 - CMP normal, glucose 98, BUN/creatinine 10/1.3, GFR 55, potassium 3.4 (3.5-5.2), Ca 8.1 (8.6-10.2) - CBC with differential: low Hb 11.3 (13-17.7), low RBC 3.57 (4.14-5.8) - ferritin 167 - PSA 0.5  09/30/2015: - Hep C virus antibody <0.1 - Lipids: 126/68/44/68 - CMP normal, except glucose 209, BUN/creatinine 22/1.39, GFR 51, potassium 5.4 (3.5-5.2), CO2 17 (18-29) - CBC with differential normal - B12 vitamin 305 (51-761)  04/01/2015: - ACR 56.3 - CBC with differential normal - Lipids: 116/56/42/63 - CMP: Glucose 193,  BUN/creatinine 21/1.23, GFR 60, LFTs normal - PSA 0.5    -+ CKD stage III: Lab Results  Component Value Date   BUN 29 (H) 06/16/2019   CREATININE 1.32 (H) 06/16/2019   -+ HL.  On Lipitor.  He is on Neurontin.  - last eye exam was on 05/21/2019: No DR -He denies numbness and tingling in his feet.   He has a history of diverticulitis (sees Dr. Keen Ewalt Gong).  He had a colectomy in 05/2017 and had to have a colostomy bag.  He was feeling better after his colectomy.  He had hernia repair surgery and reversal of his ostomy 03/08/2018.  ROS: Constitutional: + weight gain/no weight loss, no fatigue, no subjective hyperthermia, no subjective hypothermia Eyes: no blurry vision, no xerophthalmia ENT: no sore throat, no nodules palpated in neck, no dysphagia, no odynophagia, no hoarseness Cardiovascular: no CP/no SOB/no palpitations/no leg swelling Respiratory: no cough/no SOB/no wheezing Gastrointestinal: no N/no V/no D/no C/no acid reflux Musculoskeletal: no muscle aches/no joint aches Skin: no rashes, no hair loss Neurological: no tremors/no numbness/no tingling/no dizziness  I reviewed pt's medications, allergies, PMH, social hx, family hx, and changes were documented in the history of present illness. Otherwise, unchanged from my initial visit note.  Past Medical History:  Diagnosis Date  . AAA (abdominal aortic aneurysm) ([place])   . Allergy   . Atrial fibrillation [facility]) October 2013   Xarelto started; duration unknown  . Cataract   . Chronic kidney disease    Stage III   . Coronary artery disease    post bare-metal stent to LAD in 2001  . Diabetes mellitus    type 2   . Hypertension   . Myocardial infarct ([place]) 2001  . Obesity    Past Surgical History:  Procedure Laterality Date  . CARDIAC CATHETERIZATION  2006   stable with primarily nonobstructive disease  . CARDIOVERSION  03/27/2012   Procedure: CARDIOVERSION;  Surgeon: Carlena Bjornstad, MD;  Location: [facility] ENDOSCOPY;  Service:  Cardiovascular;  Laterality: N/A;  . COLOSTOMY TAKEDOWN N/A 03/08/2018   Procedure: LAPAROSCOPIC HARTMANNS REVERSAL;  Surgeon: Ileana Roup, MD;  Location: [facility] ORS;  Service: General;  Laterality: N/A;  . CORONARY ANGIOPLASTY WITH STENT PLACEMENT  2001   bare-metal stent to the LAD  . CYSTOSCOPY W/ URETERAL STENT PLACEMENT Bilateral 03/08/2018   Procedure: CYSTOSCOPY WITH RETROGRADE AND BILATERAL URETERAL STENT PLACEMENT;  Surgeon: Lucas Mallow, MD;  Location: [facility] ORS;  Service: Urology;  Laterality: Bilateral;  . CYSTOSCOPY WITH STENT PLACEMENT Bilateral 06/03/2017   Procedure: CYSTOSCOPY WITH STENT PLACEMENT;  Surgeon: Festus Aloe, MD;  Location: [facility];  Service: Urology;  Laterality: Bilateral;  . FLEXIBLE SIGMOIDOSCOPY N/A 03/08/2018   Procedure: FLEXIBLE SIGMOIDOSCOPY;  Surgeon: Ileana Roup, MD;  Location: [facility];  Service: General;  Laterality: N/A;  . HERNIA REPAIR  2006  . LAPAROSCOPIC PARASTOMAL HERNIA N/A 03/08/2018   Procedure: PARASTOMAL HERNIA REPAIR;  Surgeon:  Ileana Roup, MD;  Location: [facility];  Service: General;  Laterality: N/A;  . LAPAROSCOPIC SIGMOID COLECTOMY N/A 06/03/2017   Procedure: LAPAROSCOPIC  SIGMOID COLECTOMY, COLOSTOMY;  Surgeon: Ileana Roup, MD;  Location: [facility];  Service: General;  Laterality: N/A;  . SKIN GRAFT  1981   Social History Main Topics  . Smoking status: Former Smoker -- 0.50 packs/day for 40 years    Types: Cigarettes, Pipe, Cigars    Quit date: 05/11/1999  . Smokeless tobacco: Never Used  . Alcohol Use: No  . Drug Use: No   Social History Narrative   Married    Retired: [employer]   3 grown children   Current Outpatient Medications on File Prior to Visit  Medication Sig Dispense Refill  . acetaminophen (TYLENOL) 500 MG tablet Take 1,000 mg by mouth every 6 (six) hours as needed for mild pain, fever or headache.    Marland Kitchen ascorbic acid (VITAMIN C) 500 MG tablet Take 1 tablet (500 mg total) by mouth daily.  30 tablet 0  . atorvastatin (LIPITOR) 20 MG tablet Take 20 mg by mouth at bedtime.     Marland Kitchen guaiFENesin-dextromethorphan (ROBITUSSIN DM) 100-10 MG/5ML syrup Take 10 mLs by mouth every 4 (four) hours as needed for cough. (Patient not taking: Reported on 10/25/2019) 118 mL 0  . insulin NPH-regular Human (70-30) 100 UNIT/ML injection Inject 32-36 Units into the skin in the morning and at bedtime.    . Ipratropium-Albuterol (COMBIVENT) 20-100 MCG/ACT AERS respimat On puff up to every 4 hours only if needed for breathing. (Patient not taking: Reported on 10/25/2019) 4 g 0  . metoprolol tartrate (LOPRESSOR) 25 MG tablet Take 0.5 tablets (12.5 mg total) by mouth 2 (two) times daily. 30 tablet 0  . nitroGLYCERIN (NITROSTAT) 0.4 MG SL tablet Place 0.4 mg under the tongue every 5 (five) minutes as needed for chest pain.     Marland Kitchen omeprazole (PRILOSEC) 20 MG capsule Take 20 mg by mouth daily as needed (for acid reflux/indigestion).     . ondansetron (ZOFRAN) 4 MG tablet Take 1 tablet (4 mg total) by mouth every 6 (six) hours as needed for nausea. 20 tablet 0  . RELION PEN NEEDLES 32G X 4 MM MISC USE 1 SYRINGE  TWICE DAILY AS NEEDED 100 each 11  . rivaroxaban (XARELTO) 20 MG TABS tablet Take 1 tablet (20 mg total) by mouth daily with supper. 90 tablet 0  . TRULICITY 3 SE/8.3TD SOPN INJECT THE CONTENTS OF 1  PEN (3MG ) SUBCUTANEOUSLY  WEEKLY AS DIRECTED 6 mL 1   No current facility-administered medications on file prior to visit.   No Known Allergies Family History  Problem Relation Age of Onset  . Heart disease Father   . Heart attack Father   . ALS Mother   . Diabetes Sister   . Hypertension Sister   . Heart failure Brother   . Stroke Neg Hx    PE: BP 122/80   Pulse 85   Ht 6\' 2"  (1.88 m)   Wt 239 lb (108.4 kg)   SpO2 96%   BMI 30.69 kg/m  Body mass index is 30.69 kg/m. Wt Readings from Last 3 Encounters:  01/01/20 239 lb (108.4 kg)  10/25/19 232 lb (105.2 kg)  08/07/19 224 lb (101.6 kg)    Constitutional: overweight, in NAD Eyes: PERRLA, EOMI, no exophthalmos ENT: moist mucous membranes, no thyromegaly, no cervical lymphadenopathy Cardiovascular: RRR, No MRG Respiratory: CTA B Gastrointestinal: abdomen soft, NT, ND, BS+ Musculoskeletal:  no deformities, strength intact in all 4 Skin: moist, warm, no rashes Neurological: no tremor with outstretched hands, DTR normal in all 4  ASSESSMENT: 1. DM2, insulin-dependent, uncontrolled, with complications - CAD - CKD stage 3  2. HL  3.  Overweight  PLAN:  1. Patient with longstanding, previously uncontrolled type 2 diabetes, with improved control after starting GLP-1 receptor agonist.  He is HbA1c increased after his Trulicity was held around his hospitalization for COVID-19 in 05/2019.  We restarted this after discharge.  At last visit, sugars improved and they were mostly at goal, but CBGs in the morning were higher than goal.  We increased his 70/30 insulin dose before dinner.  HbA1c was 6.6% (improved). -Since last visit, per review of his log, his sugars are higher.  No low blood sugars but he does have spikes in the upper 200s.  I believe this is correlated with his weight gain of 15 pounds in the last 5 months.  We discussed that he absolutely needs to improve his diet and lose weight.  For now, we discussed about adding glipizide before breakfast and dinner but if the sugars do not improve on this regimen within the next 2 weeks, to increase both doses of 70/30 insulin by 4 units. - I suggested to:  Patient Instructions  Please continue: - Trulicity 3 mg weekly - 70/30 insulin 32 units before b'fast and 36 units before dinner  Please add: - Glipizide 5 mg 15-30 min before b'fast and dinner  In 2 weeks, if the sugars have not improved, stop Glipizide and increase insulin doses by 4 units.  Please return in 4 months with your sugar log.   - we checked his HbA1c: 7.0% (higher) - advised to check sugars at different  times of the day - 2-3x a day, rotating check times - advised for yearly eye exams >> he is UTD - return to clinic in 4 months     2. HL -Reviewed latest lipid panel from 04/2019: LDL above our goal of less than 70 for him: 146/55/49/85 -Continues Lipitor without side effects  3.  Overweight -Continues Trulicity which should also help with weight loss -Before last visit, he lost 18 pounds after he restarted Trulicity, but he gained approximately 15 lbs in last 5 mo!  Discussed about the importance of improving his diet.  Philemon Kingdom, MD PhD [facility] Endocrinology

## 2020-01-01 NOTE — Addendum Note (Signed)
Addended by: Cardell Peach I on: 01/01/2020 11:19 AM   Modules accepted: Orders

## 2020-01-08 ENCOUNTER — Other Ambulatory Visit: Payer: Self-pay | Admitting: Primary Care

## 2020-01-08 DIAGNOSIS — U071 COVID-19: Secondary | ICD-10-CM

## 2020-01-08 DIAGNOSIS — J849 Interstitial pulmonary disease, unspecified: Secondary | ICD-10-CM

## 2020-01-08 DIAGNOSIS — J1282 Pneumonia due to coronavirus disease 2019: Secondary | ICD-10-CM

## 2020-01-09 ENCOUNTER — Ambulatory Visit: Payer: Medicare Other | Admitting: Physician Assistant

## 2020-01-09 ENCOUNTER — Other Ambulatory Visit: Payer: Self-pay

## 2020-01-09 ENCOUNTER — Ambulatory Visit (HOSPITAL_COMMUNITY)
Admission: RE | Admit: 2020-01-09 | Discharge: 2020-01-09 | Disposition: A | Discharge status: Home / Self Care | Payer: Medicare Other | Source: Ambulatory Visit | Attending: Vascular Surgery | Admitting: Vascular Surgery

## 2020-01-09 VITALS — BP 132/90 | HR 93 | Temp 98.1°F | Resp 20 | Ht 74.0 in | Wt 238.2 lb

## 2020-01-09 DIAGNOSIS — I714 Abdominal aortic aneurysm, without rupture, unspecified: Secondary | ICD-10-CM

## 2020-01-09 NOTE — Progress Notes (Signed)
Office Note     CC:  follow up Requesting Provider:  Cyndi Bender, PA-C  HPI: [name] is a 74 y.o. ([birth year]) male who presents for routine follow-up of AAA.  He is without complaints and has had no episodes of back or abdominal pain.   Significant medical history includes DM, hypertension and CAD.  His wife accompanies him today.  3.6 cm infrarenal AAA found incidentally on CT and was evaluated by Dr. Donnetta Hutching in 2013.  He has been followed annually.  At last exam on 03/07/2020, the largest diameter was 4.3 cm.  He quit smoking in 2001. He takes rivaroxaban for atrial fibrillation. Past Medical History:  Diagnosis Date   AAA (abdominal aortic aneurysm) ([place])    Allergy    Atrial fibrillation [facility]) October 2013   Xarelto started; duration unknown   Cataract    Chronic kidney disease    Stage III    Coronary artery disease    post bare-metal stent to LAD in 2001   Diabetes mellitus    type 2    Hypertension    Myocardial infarct [facility]) 2001   Obesity     Past Surgical History:  Procedure Laterality Date   CARDIAC CATHETERIZATION  2006   stable with primarily nonobstructive disease   CARDIOVERSION  03/27/2012   Procedure: CARDIOVERSION;  Surgeon: Carlena Bjornstad, MD;  Location: [facility] ENDOSCOPY;  Service: Cardiovascular;  Laterality: N/A;   COLOSTOMY TAKEDOWN N/A 03/08/2018   Procedure: LAPAROSCOPIC HARTMANNS REVERSAL;  Surgeon: Ileana Roup, MD;  Location: [facility];  Service: General;  Laterality: N/A;   CORONARY ANGIOPLASTY WITH STENT PLACEMENT  2001   bare-metal stent to the LAD   CYSTOSCOPY W/ URETERAL STENT PLACEMENT Bilateral 03/08/2018   Procedure: CYSTOSCOPY WITH RETROGRADE AND BILATERAL URETERAL STENT PLACEMENT;  Surgeon: Lucas Mallow, MD;  Location: [facility];  Service: Urology;  Laterality: Bilateral;   CYSTOSCOPY WITH STENT PLACEMENT Bilateral 06/03/2017   Procedure: CYSTOSCOPY WITH STENT PLACEMENT;  Surgeon: Festus Aloe, MD;   Location: [facility];  Service: Urology;  Laterality: Bilateral;   FLEXIBLE SIGMOIDOSCOPY N/A 03/08/2018   Procedure: FLEXIBLE SIGMOIDOSCOPY;  Surgeon: Ileana Roup, MD;  Location: [facility];  Service: General;  Laterality: N/A;   HERNIA REPAIR  2006   LAPAROSCOPIC PARASTOMAL HERNIA N/A 03/08/2018   Procedure: PARASTOMAL HERNIA REPAIR;  Surgeon: Ileana Roup, MD;  Location: [facility];  Service: General;  Laterality: N/A;   LAPAROSCOPIC SIGMOID COLECTOMY N/A 06/03/2017   Procedure: LAPAROSCOPIC  SIGMOID COLECTOMY, COLOSTOMY;  Surgeon: Ileana Roup, MD;  Location: [facility];  Service: General;  Laterality: N/A;   SKIN GRAFT  1981    Social History   Socioeconomic History   Marital status: Married    Spouse name: Not on file   Number of children: Not on file   Years of education: Not on file   Highest education level: Not on file  Occupational History   Not on file  Tobacco Use   Smoking status: Former Smoker    Packs/day: 0.50    Years: 40.00    Pack years: 20.00    Types: Cigarettes, Pipe, Cigars    Quit date: 05/11/1999    Years since quitting: 20.6   Smokeless tobacco: Never Used  Scientific laboratory technician Use: Never used  Substance and Sexual Activity   Alcohol use: No   Drug use: No   Sexual activity: Yes  Other Topics Concern   Not on file  Social History Narrative   Married    Retired: [employer]   3 grown children   Social Determinants of Radio broadcast assistant Strain:    Difficulty of Paying Living Expenses: Not on file  Food Insecurity:    Worried About Charity fundraiser in the Last Year: Not on file   [employer] of Food in the Last Year: Not on file  Transportation Needs:    Lack of Transportation (Medical): Not on file   Lack of Transportation (Non-Medical): Not on file  Physical Activity:    Days of Exercise per Week: Not on file   Minutes of Exercise per Session: Not on file  Stress:    Feeling of Stress : Not on file    Social Connections:    Frequency of Communication with Friends and Family: Not on file   Frequency of Social Gatherings with Friends and Family: Not on file   Attends Religious Services: Not on file   Active Member of Clubs or Organizations: Not on file   Attends Archivist Meetings: Not on file   Marital Status: Not on file  Intimate Partner Violence:    Fear of Current or Ex-Partner: Not on file   Emotionally Abused: Not on file   Physically Abused: Not on file   Sexually Abused: Not on file   Family History  Problem Relation Age of Onset   Heart disease Father    Heart attack Father    ALS Mother    Diabetes Sister    Hypertension Sister    Heart failure Brother    Stroke Neg Hx     Current Outpatient Medications  Medication Sig Dispense Refill   acetaminophen (TYLENOL) 500 MG tablet Take 1,000 mg by mouth every 6 (six) hours as needed for mild pain, fever or headache.     ascorbic acid (VITAMIN C) 500 MG tablet Take 1 tablet (500 mg total) by mouth daily. 30 tablet 0   atorvastatin (LIPITOR) 20 MG tablet Take 20 mg by mouth at bedtime.      glipiZIDE (GLUCOTROL) 5 MG tablet Take 1 tablet (5 mg total) by mouth 2 (two) times daily before a meal. 180 tablet 3   guaiFENesin-dextromethorphan (ROBITUSSIN DM) 100-10 MG/5ML syrup Take 10 mLs by mouth every 4 (four) hours as needed for cough. 118 mL 0   insulin NPH-regular Human (70-30) 100 UNIT/ML injection Inject 32-36 Units into the skin in the morning and at bedtime.     Ipratropium-Albuterol (COMBIVENT) 20-100 MCG/ACT AERS respimat On puff up to every 4 hours only if needed for breathing. 4 g 0   metoprolol tartrate (LOPRESSOR) 25 MG tablet Take 0.5 tablets (12.5 mg total) by mouth 2 (two) times daily. 30 tablet 0   nitroGLYCERIN (NITROSTAT) 0.4 MG SL tablet Place 0.4 mg under the tongue every 5 (five) minutes as needed for chest pain.      omeprazole (PRILOSEC) 20 MG capsule Take 20 mg by  mouth daily as needed (for acid reflux/indigestion).      ondansetron (ZOFRAN) 4 MG tablet Take 1 tablet (4 mg total) by mouth every 6 (six) hours as needed for nausea. 20 tablet 0   RELION PEN NEEDLES 32G X 4 MM MISC USE 1 SYRINGE  TWICE DAILY AS NEEDED 100 each 11   rivaroxaban (XARELTO) 20 MG TABS tablet Take 1 tablet (20 mg total) by mouth daily with supper. 90 tablet 0   TRULICITY 3 FK/8.1EX SOPN INJECT THE CONTENTS OF  1  PEN (3MG ) SUBCUTANEOUSLY  WEEKLY AS DIRECTED 6 mL 1   No current facility-administered medications for this visit.    No Known Allergies   REVIEW OF SYSTEMS:   [X]  denotes positive finding, [ ]  denotes negative finding Cardiac  Comments:  Chest pain or chest pressure:    Shortness of breath upon exertion:    Short of breath when lying flat:    Irregular heart rhythm: x  hx a. fib      Vascular    Pain in calf, thigh, or hip brought on by ambulation: x Hx DDD  Pain in feet at night that wakes you up from your sleep:     Blood clot in your veins:    Leg swelling:         Pulmonary    Oxygen at home:    Productive cough:     Wheezing:         Neurologic    Sudden weakness in arms or legs:     Sudden numbness in arms or legs:     Sudden onset of difficulty speaking or slurred speech:    Temporary loss of vision in one eye:     Problems with dizziness:         Gastrointestinal    Blood in stool:     Vomited blood:         Genitourinary    Burning when urinating:     Blood in urine:        Psychiatric    Major depression:         Hematologic    Bleeding problems:    Problems with blood clotting too easily:        Skin    Rashes or ulcers:        Constitutional    Fever or chills:      PHYSICAL EXAMINATION:  Vitals:   01/09/20 0924  BP: 132/90  Pulse: 93  Resp: 20  Temp: 98.1 F (36.7 C)  SpO2: 97%   General:  WDWN in NAD; vital signs documented above Gait: unaided, no ataxia HENT: WNL, normocephalic Pulmonary: normal  non-labored breathing , without Rales, rhonchi,  wheezing Cardiac: irregular HR, without  Murmurs without carotid bruit Abdomen: soft, NT, no masses Skin: without rashes Vascular Exam/Pulses: 2+ brachial, radial, femoral and dorsalis pedis pulses bilaterally Extremities: without ischemic changes, without Gangrene , without cellulitis; without open wounds;  Musculoskeletal: no muscle wasting or atrophy  Neurologic: A&O X 3;  No focal weakness or paresthesias are detected Psychiatric:  The pt has Normal affect.   Non-Invasive Vascular Imaging:   01/09/2020 Abdominal Aorta: There is evidence of abnormal dilatation of the mid  Abdominal aorta.Mid aorta measurement of 4.19 cm. There is evidence of  abnormal dilation of the Right Common Iliac artery. The largest aortic  diameter remains essentially unchanged  compared to prior exam. Previous diameter measurement was obtained on  03/08/19.   Right CIA 2.3 cm Left CIA 1.6 cm  ASSESSMENT/PLAN:: 74 y.o. male here for follow up for infrarenal abdominal aortic aneurysm.  Maximal aorta measurement today is 4.2 cm.  He is asymptomatic.  Slight increase in diameter of right common iliac artery.  No significant change in left common iliac artery measurements.  Stable size of infrarenal AAA.  We discussed continued control of blood pressure and blood glucose levels.  Seek immediate emergent medical attention for onset of acute abdominal or back pain.  Follow-up in 1 year with  ultrasound.  Barbie Banner, PA-C [facility] [phone]  Clinic MD:   Scot Dock

## 2020-01-17 ENCOUNTER — Other Ambulatory Visit: Payer: Self-pay

## 2020-01-17 ENCOUNTER — Ambulatory Visit (INDEPENDENT_AMBULATORY_CARE_PROVIDER_SITE_OTHER)
Admission: RE | Admit: 2020-01-17 | Discharge: 2020-01-17 | Disposition: A | Discharge status: Home / Self Care | Payer: Medicare Other | Source: Ambulatory Visit | Attending: Primary Care | Admitting: Primary Care

## 2020-01-17 DIAGNOSIS — J1282 Pneumonia due to coronavirus disease 2019: Secondary | ICD-10-CM | POA: Diagnosis not present

## 2020-01-17 DIAGNOSIS — J849 Interstitial pulmonary disease, unspecified: Secondary | ICD-10-CM

## 2020-01-17 DIAGNOSIS — U071 COVID-19: Secondary | ICD-10-CM

## 2020-01-20 NOTE — Progress Notes (Signed)
Chief Complaint  Patient presents with  . Follow-up    CAD    History of Present Illness: 74 yo male with history of CAD, HTN, hyperlipidemia, persistent atrial fibrillation, AAA and DM who is here today for cardiac follow up. In 2001 he had an anterior STEMI treated with a bare-metal stent in the LAD. He underwent catheterization in 2006 and the stent was patent and there was an 80% narrowing in the diagonal branch which was treated medically. He was noted to be in atrial fibrillation in October 2013 and was started on Xarelto and DCCV was arranged on 03/27/12 with return to sinus rhythm. He has since converted back to atrial fibrillation and has been controlled on metoprolol.  AAA followed in [facility]. Echo July 2018 with normal LV size and function with no significant valve disease. 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 He had colostomy reversal in October 2019. He had Covid pneumonia in February 2021. Atrial fib with RVR while hospitalized.   He is here today for follow up. The patient denies any chest pain, dyspnea, palpitations, lower extremity edema, orthopnea, PND, dizziness, near syncope or syncope. He has recovered fully from his Covid pneumonia.     Primary Care Physician: Cyndi Bender, PA-C  Past Medical History:  Diagnosis Date  . AAA (abdominal aortic aneurysm) ([place])   . Allergy   . Atrial fibrillation [facility]) October 2013   Xarelto started; duration unknown  . Cataract   . Chronic kidney disease    Stage III   . Coronary artery disease    post bare-metal stent to LAD in 2001  . Diabetes mellitus    type 2   . Hypertension   . Myocardial infarct ([place]) 2001  . Obesity     Past Surgical History:  Procedure Laterality Date  . CARDIAC CATHETERIZATION  2006   stable with primarily nonobstructive disease  . CARDIOVERSION  03/27/2012   Procedure: CARDIOVERSION;  Surgeon: Carlena Bjornstad, MD;  Location: [facility] ENDOSCOPY;  Service:  Cardiovascular;  Laterality: N/A;  . COLOSTOMY TAKEDOWN N/A 03/08/2018   Procedure: LAPAROSCOPIC HARTMANNS REVERSAL;  Surgeon: Ileana Roup, MD;  Location: [facility] ORS;  Service: General;  Laterality: N/A;  . CORONARY ANGIOPLASTY WITH STENT PLACEMENT  2001   bare-metal stent to the LAD  . CYSTOSCOPY W/ URETERAL STENT PLACEMENT Bilateral 03/08/2018   Procedure: CYSTOSCOPY WITH RETROGRADE AND BILATERAL URETERAL STENT PLACEMENT;  Surgeon: Lucas Mallow, MD;  Location: [facility] ORS;  Service: Urology;  Laterality: Bilateral;  . CYSTOSCOPY WITH STENT PLACEMENT Bilateral 06/03/2017   Procedure: CYSTOSCOPY WITH STENT PLACEMENT;  Surgeon: Festus Aloe, MD;  Location: [facility] ORS;  Service: Urology;  Laterality: Bilateral;  . FLEXIBLE SIGMOIDOSCOPY N/A 03/08/2018   Procedure: FLEXIBLE SIGMOIDOSCOPY;  Surgeon: Ileana Roup, MD;  Location: [facility] ORS;  Service: General;  Laterality: N/A;  . HERNIA REPAIR  2006  . LAPAROSCOPIC PARASTOMAL HERNIA N/A 03/08/2018   Procedure: PARASTOMAL HERNIA REPAIR;  Surgeon: Ileana Roup, MD;  Location: [facility] ORS;  Service: General;  Laterality: N/A;  . LAPAROSCOPIC SIGMOID COLECTOMY N/A 06/03/2017   Procedure: LAPAROSCOPIC  SIGMOID COLECTOMY, COLOSTOMY;  Surgeon: Ileana Roup, MD;  Location: [facility]S;  Service: General;  Laterality: N/A;  . SKIN GRAFT  1981    Current Outpatient Medications  Medication Sig Dispense Refill  . acetaminophen (TYLENOL) 500 MG tablet Take 1,000 mg by mouth every 6 (six) hours as needed for mild pain, fever  or headache.    Marland Kitchen ascorbic acid (VITAMIN C) 500 MG tablet Take 1 tablet (500 mg total) by mouth daily. 30 tablet 0  . atorvastatin (LIPITOR) 20 MG tablet Take 20 mg by mouth at bedtime.     Marland Kitchen glipiZIDE (GLUCOTROL) 5 MG tablet Take 1 tablet (5 mg total) by mouth 2 (two) times daily before a meal. 180 tablet 3  . insulin NPH-regular Human (70-30) 100 UNIT/ML injection Inject 32-36 Units into the skin in the morning and at  bedtime.    . metoprolol tartrate (LOPRESSOR) 25 MG tablet Take 0.5 tablets (12.5 mg total) by mouth 2 (two) times daily. 30 tablet 0  . nitroGLYCERIN (NITROSTAT) 0.4 MG SL tablet Place 0.4 mg under the tongue every 5 (five) minutes as needed for chest pain.     Marland Kitchen omeprazole (PRILOSEC) 20 MG capsule Take 20 mg by mouth daily as needed (for acid reflux/indigestion).     . RELION PEN NEEDLES 32G X 4 MM MISC USE 1 SYRINGE  TWICE DAILY AS NEEDED 100 each 11  . rivaroxaban (XARELTO) 20 MG TABS tablet Take 1 tablet (20 mg total) by mouth daily with supper. 90 tablet 0  . TRULICITY 3 TO/6.7TI SOPN INJECT THE CONTENTS OF 1  PEN (3MG ) SUBCUTANEOUSLY  WEEKLY AS DIRECTED 6 mL 1   No current facility-administered medications for this visit.    No Known Allergies  Social History   Socioeconomic History  . Marital status: Married    Spouse name: Not on file  . Number of children: Not on file  . Years of education: Not on file  . Highest education level: Not on file  Occupational History  . Not on file  Tobacco Use  . Smoking status: Former Smoker    Packs/day: 0.50    Years: 40.00    Pack years: 20.00    Types: Cigarettes, Pipe, Cigars    Quit date: 05/11/1999    Years since quitting: 20.7  . Smokeless tobacco: Never Used  Vaping Use  . Vaping Use: Never used  Substance and Sexual Activity  . Alcohol use: No  . Drug use: No  . Sexual activity: Yes  Other Topics Concern  . Not on file  Social History Narrative   Married    Retired: [employer]   3 grown children   Social Determinants of Radio broadcast assistant Strain:   . Difficulty of Paying Living Expenses: Not on file  Food Insecurity:   . Worried About Charity fundraiser in the Last Year: Not on file  . Ran Out of Food in the Last Year: Not on file  Transportation Needs:   . Lack of Transportation (Medical): Not on file  . Lack of Transportation (Non-Medical): Not on file  Physical Activity:   . Days of Exercise per Week: Not  on file  . Minutes of Exercise per Session: Not on file  Stress:   . Feeling of Stress : Not on file  Social Connections:   . Frequency of Communication with Friends and Family: Not on file  . Frequency of Social Gatherings with Friends and Family: Not on file  . Attends Religious Services: Not on file  . Active Member of Clubs or Organizations: Not on file  . Attends Archivist Meetings: Not on file  . Marital Status: Not on file  Intimate Partner Violence:   . Fear of Current or Ex-Partner: Not on file  . Emotionally Abused: Not on file  .  Physically Abused: Not on file  . Sexually Abused: Not on file    Family History  Problem Relation Age of Onset  . Heart disease Father   . Heart attack Father   . ALS Mother   . Diabetes Sister   . Hypertension Sister   . Heart failure Brother   . Stroke Neg Hx     Review of Systems:  As stated in the HPI and otherwise negative.   BP 120/84   Pulse 68   Ht 6\' 2"  (1.88 m)   Wt 242 lb (109.8 kg)   SpO2 97%   BMI 31.07 kg/m   Physical Examination: General: Well developed, well nourished, NAD  HEENT: OP clear, mucus membranes moist  SKIN: warm, dry. No rashes. Neuro: No focal deficits  Musculoskeletal: Muscle strength 5/5 all ext  Psychiatric: Mood and affect normal  Neck: No JVD, no carotid bruits, no thyromegaly, no lymphadenopathy.  Lungs:Clear bilaterally, no wheezes, rhonci, crackles Cardiovascular: Regular rate and rhythm. No murmurs, gallops or rubs. Abdomen:Soft. Bowel sounds present. Non-tender.  Extremities: No lower extremity edema. Pulses are 2 + in the bilateral DP/PT.  Echo 11/17/16: .- Left ventricle: The cavity size was normal. There was moderate   concentric hypertrophy. Systolic function was normal. The   estimated ejection fraction was in the range of 50% to 55%. Mild   hypokinesis of the inferoseptal and apical inferior myocardium. - Aortic valve: Transvalvular velocity was within the normal  range.   There was no stenosis. There was no regurgitation. - Mitral valve: Transvalvular velocity was within the normal range.   There was no evidence for stenosis. There was no regurgitation. - Left atrium: The atrium was severely dilated. - Right ventricle: The cavity size was normal. Wall thickness was   normal. Systolic function was normal. - Right atrium: The atrium was severely dilated. - Atrial septum: No defect or patent foramen ovale was identified   by color flow Doppler. - Tricuspid valve: There was mild regurgitation. - Pulmonary arteries: Systolic pressure was within the normal   range. PA peak pressure: 35 mm Hg (S).  EKG:  EKG is not ordered today. The ekg ordered today demonstrates  Recent Labs: 06/16/2019: ALT 44; BUN 29; Creatinine, Ser 1.32; Hemoglobin 12.2; Magnesium 2.0; Platelets 142; Potassium 4.6; Sodium 135   Lipid Panel Followed in primary care   Wt Readings from Last 3 Encounters:  01/21/20 242 lb (109.8 kg)  01/09/20 238 lb 3.2 oz (108 kg)  01/01/20 239 lb (108.4 kg)     Other studies Reviewed: Additional studies/ records that were reviewed today include:  Review of the above records demonstrates:  Assessment and Plan:   1. CAD without angina: He has no chest pain. Will continue statin and beta blocker. No ASA since he is on Xarelto.  2. Atrial fibrillation, persistent: Rate controlled atrial fib today. Continue Xarelto and beta blocker.      3. HYPERTENSION: BP is controlled. No changes  Current medicines are reviewed at length with the patient today.  The patient does not have concerns regarding medicines.  The following changes have been made:  no change  Labs/ tests ordered today include:  No orders of the defined types were placed in this encounter.   Disposition:   FU with me in 6 months  Signed, Lauree Chandler, MD 01/21/2020 4:21 PM    [facility] [place], [place], [place]  [address] Phone:  [phone]; Fax: [phone]

## 2020-01-21 ENCOUNTER — Encounter: Payer: Self-pay | Admitting: Cardiovascular Disease

## 2020-01-21 ENCOUNTER — Ambulatory Visit: Payer: Medicare Other | Admitting: Cardiovascular Disease

## 2020-01-21 ENCOUNTER — Other Ambulatory Visit: Payer: Self-pay

## 2020-01-21 VITALS — BP 120/84 | HR 68 | Ht 74.0 in | Wt 242.0 lb

## 2020-01-21 DIAGNOSIS — I4819 Other persistent atrial fibrillation: Secondary | ICD-10-CM | POA: Diagnosis not present

## 2020-01-21 DIAGNOSIS — I2510 Atherosclerotic heart disease of native coronary artery without angina pectoris: Secondary | ICD-10-CM

## 2020-01-21 DIAGNOSIS — I10 Essential (primary) hypertension: Secondary | ICD-10-CM | POA: Diagnosis not present

## 2020-01-21 NOTE — Patient Instructions (Signed)
Medication Instructions:  Your provider recommends that you continue on your current medications as directed. Please refer to the Current Medication list given to you today.   *If you need a refill on your cardiac medications before your next appointment, please call your pharmacy*  Follow-Up: At [facility], you and your health needs are our priority.  As part of our continuing mission to provide you with exceptional heart care, we have created designated Provider Care Teams.  These Care Teams include your primary Cardiologist (physician) and Advanced Practice Providers (APPs -  Physician Assistants and Nurse Practitioners) who all work together to provide you with the care you need, when you need it. Your next appointment:   12 month(s) The format for your next appointment:   In Person Provider:   Dr. Angelena Form

## 2020-01-22 ENCOUNTER — Other Ambulatory Visit (HOSPITAL_COMMUNITY): Payer: Medicare Other

## 2020-01-24 NOTE — Progress Notes (Signed)
Called patient to go over results, no answer. He has apt tomorrow with [employer]

## 2020-01-24 NOTE — Progress Notes (Signed)
ILD appears new, he had covid in February. 2021. He has upcoming PFTs tomorrow. Do you want me to get him in with [facility] or MR. Or do you want to manage?

## 2020-01-25 ENCOUNTER — Encounter: Payer: Self-pay | Admitting: Internal Medicine

## 2020-01-25 ENCOUNTER — Other Ambulatory Visit: Payer: Self-pay

## 2020-01-25 ENCOUNTER — Ambulatory Visit: Payer: Medicare Other | Admitting: Internal Medicine

## 2020-01-25 DIAGNOSIS — J1282 Pneumonia due to coronavirus disease 2019: Secondary | ICD-10-CM

## 2020-01-25 DIAGNOSIS — U071 COVID-19: Secondary | ICD-10-CM

## 2020-01-25 DIAGNOSIS — J449 Chronic obstructive pulmonary disease, unspecified: Secondary | ICD-10-CM | POA: Insufficient documentation

## 2020-01-25 DIAGNOSIS — J9601 Acute respiratory failure with hypoxia: Secondary | ICD-10-CM | POA: Diagnosis not present

## 2020-01-25 LAB — PULMONARY FUNCTION TEST
DL/VA % pred: 107 %
DL/VA: 4.18 ml/min/mmHg/L
DLCO cor % pred: 61 %
DLCO cor: 17.93 ml/min/mmHg
DLCO unc % pred: 61 %
DLCO unc: 17.93 ml/min/mmHg
FEF 25-75 Pre: 1.81 L/sec
FEF2575-%Pred-Pre: 65 %
FEV1-%Pred-Pre: 66 %
FEV1-Pre: 2.52 L
FEV1FVC-%Pred-Pre: 90 %
FEV6-%Pred-Pre: 78 %
FEV6-Pre: 3.82 L
FEV6FVC-%Pred-Pre: 105 %
FVC-%Pred-Pre: 74 %
FVC-Pre: 3.82 L
Pre FEV1/FVC ratio: 66 %
Pre FEV6/FVC Ratio: 100 %

## 2020-01-25 NOTE — Patient Instructions (Addendum)
Make sure you check your oxygen saturations at highest level of activity to be sure it stays over 90% and adjust upward to maintain this level if needed but remember to turn it back to previous settings when you stop (to conserve your supply).   Only use your combivent as a rescue medication to be used if you can't catch your breath by resting or doing a relaxed purse lip breathing pattern.  - The less you use it, the better it will work when you need it. - Ok to use up to 1 puff every 4 hours if you must but call for immediate appointment if use goes up over your usual need - Don't leave home without it !!  (think of it like the spare tire for your car)   Consider the booster for moderna if and when it's offered.    If you are satisfied with your treatment plan,  let your doctor know and he/she can either refill your medications or you can return here when your prescription runs out.     If in any way you are not 100% satisfied,  please tell us.  If 100% better, tell your friends!  Pulmonary follow up is as needed

## 2020-01-25 NOTE — Assessment & Plan Note (Addendum)
Quit smoking 2001 -  PFT's  01/25/2020  FEV1 2.52 (66 % ) ratio 0.66    with DLCO  17.93 (61%) corrects to 4.18  (106%)  for alv volume and FV curve somewhat non-physilogic    Final discussion As I explained to this patient /wife in detail:  although there is copd present, it may not be clinically relevant:   it does not appear to be limiting activity .  That is to say:   this pt is so sedentary I don't recommend aggressive pulmonary rx at this point unless limiting symptoms arise or acute exacerbations become as issue, neither of which is the case now.  I asked the patient to contact this office at any time in the future should either of these problems arise.    I spent extra time with pt today reviewing appropriate use of albuterol for prn use on exertion with the following points: 1) saba is for relief of sob that does not improve by walking a slower pace or resting but rather if the pt does not improve after trying this first. 2) If the pt is convinced, as many are, that saba helps recover from activity faster then it's easy to tell if this is the case by re-challenging : ie stop, take the inhaler, then p 5 minutes try the exact same activity (intensity of workload) that just caused the symptoms and see if they are substantially diminished or not after saba 3) if there is an activity that reproducibly causes the symptoms, try the saba 15 min before the activity on alternate days   If in fact the saba really does help, then fine to continue to use it prn but advised may need to look closer at the maintenance regimen being used to achieve better control of airways disease with exertion.    Pulmonary f/u can be prn           Each maintenance medication was reviewed in detail including emphasizing most importantly the difference between maintenance and prns and under what circumstances the prns are to be triggered using an action plan format where appropriate.  Total time for H and P, chart  review, counseling, teaching device and generating customized AVS unique to this office visit / charting = 31 min

## 2020-01-25 NOTE — Assessment & Plan Note (Signed)
Dx at covid 19 admit  06/11/19  - 07/25/2019   Walked RA x two laps =  approx 524ft @ moderate (his nl) pace - stopped due to end of study s sob  with sats of 92 % at the end of the study    Again advised:' Make sure you check your oxygen saturations at highest level of activity to be sure it stays over 90% and keep track of it at least once a week, more often if breathing getting worse, and let me know if losing ground.

## 2020-01-25 NOTE — Assessment & Plan Note (Signed)
See admit 06/11/19  - no desats walking 07/25/2019   - HRCT 01/17/20 mild /mod PF  prob uip vs NSIP  - PFTs  C/w GOLD II copd with DLCO  17.93 (61%)  Corrects to 4.18 ( 106%)   Doubt UIP in this setting and best way to be sure is serial sats at highest level of exercise   Advised was not infected with the delta variant previously and reinfection as his moderna wanes could be very serious in this setting > stay tuned for new recs coming on booster rx.

## 2020-01-25 NOTE — Progress Notes (Signed)
Kathleene Hazel, male    DOB: [birth year],    MRN: [id number]   Brief patient profile:  74yowm quit smoking 05/11/1999 due to heart dz no resp symptoms   Then acutely ill with COVID 19:   Admit date: 06/11/2019 Discharge date: 06/16/2019        Covid pneumonia/acute respiratory failure with hypoxia COVID-19 Labs  Recent Labs (last 2 labs)        Recent Labs    06/14/19 0810 06/15/19 0525 06/16/19 0340  DDIMER 4.45* 5.83* 11.97*  FERRITIN 813* 789* 803*  CRP 4.2* 2.8* 11.7*      Recent Labs       Lab Results  Component Value Date   SARSCOV2NAA POSITIVE (A) 06/11/2019     -Decadron6 mg daily -Remdesivirper pharmacy protocol(end date 2/6) -2/2 transfuse 1 unit Covid convalescent plasma -Vitamins per pharmacy protocol -Flutter valve -Incentive spirometry -Combivent -Titrate O2 to maintain SPO2> 88% SATURATION QUALIFICATIONS: (Thisnote is usedto comply with regulatory documentation for home oxygen) Patient Saturations on Room Air at Rest =86% Patient Saturations on6Liters of oxygen while Ambulating = 81-90% Please briefly explain why patient needs home oxygen: Pt O2 sats less than 88% on RA at rest. Pt has been on 4-6L HFNC during the day. Pt ambulated on 6L and sats ranged from 81-90%. Pt now sitting in chair on 4L, sats in the low 90's. -Patient meets criteria for home O2 -6 L O2 via [place] titrate to maintain SPO2>88% -ProvideInogenhome portable O2 concentrator -Patient has been counseled that he will be considered contagious until 2/21.  Patient will need to take appropriate precautions i.e. wearing of mask, quarantine as much as possible, washing hands frequently, wiping down surfaces with disinfectant frequently etc. -Follow-up with PCP in 3 weeks   A. fib with RVR -Currently NSR -Metoprolol 12.5 mg BID -Continue Xarelto  Essential HTN -See A. fib RVR  CAD of native vessels -Lipitor 20 mg daily -2/4 LDL = 67  Uncontrolled diabetes  type 2 with complication -2/2 hemoglobin A1c= 7.3 -2/4 increase Levemir 20 units BID -2/4 increase NovoLog 10 units qac   Acute kidney injury (baseline Cr 1.3) -Hold ACE inhibitor.  PCP to decide when/if to restart Last Labs          Recent Labs  Lab 06/11/19 2050 06/13/19 0259 06/14/19 0810 06/15/19 0525 06/16/19 0340  CREATININE 1.68* 1.57* 1.42* 1.33* 1.32*    -At baseline        Discharge Diagnoses:  Active Problems:   Hyperlipidemia   HYPERTENSION, BENIGN   CAD, NATIVE VESSEL   Uncontrolled type 2 diabetes mellitus with circulatory disorder   A-fib (HCC)   Chronic anticoagulation   Suspected COVID-19 virus infection   COVID-19 virus infection   Pneumonia due to COVID-19 virus   Acute respiratory failure with hypoxia (HCC)   AKI (acute kidney injury) ([place])   Discharge Condition: Stable  Diet recommendation: Carb modified     Filed Weights   06/11/19 1940  Weight: 108.9 kg    History of present illness:  74 y.o. WM PMHx diabetes mellitus type 2, chronic kidney disease stage III, CAD hypertension atrial fibrillation on Xareltos/p Colectomy for Colon cancer   Presents with shortness of breath, he went to his PCP and was found to be satting 85% on room air he said is gotten progressively worse over the last 5 days  Hospital Course:  During his hospitalization patient treated for Covid pneumonia/acute respiratory failure with hypoxia.  Patient responded well to treatment  however will need to go home on oxygen.      History of Present Illness  07/25/2019  Pulmonary/ 1st office eval/Adea Geisel  Chief Complaint  Patient presents with  . Consult    post covid positive 06/11/19, shortness of breath after his 1 mile walk,    Dyspnea:  Can walk a half mile s stopping pace is little slower only checks 02 after sits  Cough: none  Sleep: sleeps flat, 2 pillows  SABA use: none rec Only use your combivent  as a rescue medication Try combivent  15  min before an activity that you know would make you short of breath  Make sure you check your oxygen saturations at highest level of activity to be sure it stays over 90%    01/25/2020  f/u ov/Samin Milke re:  Copd II/ post covid mild PF  Chief Complaint  Patient presents with  . Follow-up    Breathing is overall doing well. No new co's today.   Dyspnea: not really limited from desired activity  Cough: none  Sleeping: flat bed / 2 pillows  SABA use: not using  02: not using/ not checking as rec    No obvious day to day or daytime variability or assoc excess/ purulent sputum or mucus plugs or hemoptysis or cp or chest tightness, subjective wheeze or overt sinus or hb symptoms.   Sleeping  without nocturnal  or early am exacerbation  of respiratory  c/o's or need for noct saba. Also denies any obvious fluctuation of symptoms with weather or environmental changes or other aggravating or alleviating factors except as outlined above   No unusual exposure hx or h/o childhood pna/ asthma or knowledge of premature birth.  Current Allergies, Complete Past Medical History, Past Surgical History, Family History, and Social History were reviewed in [employer] record.  ROS  The following are not active complaints unless bolded Hoarseness, sore throat, dysphagia, dental problems, itching, sneezing,  nasal congestion or discharge of excess mucus or purulent secretions, ear ache,   fever, chills, sweats, unintended wt loss or wt gain, classically pleuritic or exertional cp,  orthopnea pnd or arm/hand swelling  or leg swelling, presyncope, palpitations, abdominal pain, anorexia, nausea, vomiting, diarrhea  or change in bowel habits or change in bladder habits, change in stools or change in urine, dysuria, hematuria,  rash, arthralgias, visual complaints, headache, numbness, weakness or ataxia or problems with walking or coordination,  change in mood or  memory.        Current Meds    Medication Sig  . acetaminophen (TYLENOL) 500 MG tablet Take 1,000 mg by mouth every 6 (six) hours as needed for mild pain, fever or headache.  Marland Kitchen ascorbic acid (VITAMIN C) 500 MG tablet Take 1 tablet (500 mg total) by mouth daily.  Marland Kitchen atorvastatin (LIPITOR) 20 MG tablet Take 20 mg by mouth at bedtime.   Marland Kitchen glipiZIDE (GLUCOTROL) 5 MG tablet Take 1 tablet (5 mg total) by mouth 2 (two) times daily before a meal.  . insulin NPH-regular Human (70-30) 100 UNIT/ML injection Inject 32-36 Units into the skin in the morning and at bedtime.  . metoprolol tartrate (LOPRESSOR) 25 MG tablet Take 0.5 tablets (12.5 mg total) by mouth 2 (two) times daily.  . nitroGLYCERIN (NITROSTAT) 0.4 MG SL tablet Place 0.4 mg under the tongue every 5 (five) minutes as needed for chest pain.   Marland Kitchen omeprazole (PRILOSEC) 20 MG capsule Take 20 mg by mouth daily as needed (for acid  reflux/indigestion).   . RELION PEN NEEDLES 32G X 4 MM MISC USE 1 SYRINGE  TWICE DAILY AS NEEDED  . rivaroxaban (XARELTO) 20 MG TABS tablet Take 1 tablet (20 mg total) by mouth daily with supper.  . TRULICITY 3 UM/3.5TI SOPN INJECT THE CONTENTS OF 1  PEN (3MG ) SUBCUTANEOUSLY  WEEKLY AS DIRECTED                     Past Medical History:  Diagnosis Date  . AAA (abdominal aortic aneurysm) ([place])   . Allergy   . Atrial fibrillation [facility]) October 2013   Xarelto started; duration unknown  . Cataract   . Chronic kidney disease    Stage III   . Coronary artery disease    post bare-metal stent to LAD in 2001  . Diabetes mellitus    type 2   . Hypertension   . Myocardial infarct ([place]) 2001  . Obesity        Objective:    Pleasant amb mod obese wm nad    Wt Readings from Last 3 Encounters:  01/25/20 240 lb (108.9 kg)  01/21/20 242 lb (109.8 kg)  01/09/20 238 lb 3.2 oz (108 kg)     Vital signs reviewed - Note on arrival 01/25/2020  02 sats  98% on RA  HEENT : pt wearing mask not removed for exam due to covid - 19 concerns.   NECK :   without JVD/Nodes/TM/ nl carotid upstrokes bilaterally   LUNGS: no acc muscle use,  Min barrel  contour chest wall with bilateral  slightly decreased bs and subtle end insp crackles  and  without cough on insp or exp maneuvers and min  Hyperresonant  to  percussion bilaterally     CV:  RRR  no s3 or murmur or increase in P2, and no edema   ABD:  soft and nontender with pos end  insp Hoover's  in the supine position. No bruits or organomegaly appreciated, bowel sounds nl  MS:   Nl gait/  ext warm without deformities, calf tenderness, cyanosis or clubbing No obvious joint restrictions   SKIN: warm and dry without lesions    NEURO:  alert, approp, nl sensorium with  no motor or cerebellar deficits apparent.            I personally reviewed images and agree with radiology impression as follows:   Chest CT HRCT  01/17/20 There is mild to moderate pulmonary fibrosis in a pattern with apical to basal gradient, featuring irregular peripheral interstitial opacity, bibasilar bronchiolectasis, and no definitive evidence of honeycombing. No significant air trapping on expiratory phase imaging. 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          Assessment

## 2020-01-29 ENCOUNTER — Other Ambulatory Visit: Payer: Medicare Other

## 2020-02-18 ENCOUNTER — Other Ambulatory Visit: Payer: Self-pay | Admitting: Cardiovascular Disease

## 2020-02-19 NOTE — Telephone Encounter (Signed)
Pt last saw Dr Angelena Form 01/21/20, last labs 06/16/19 Creat 1.32, age 74, weight 108.9kg, CrCl 75.63, based on CrCl pt is on appropriate dosage of Xarelto 20mg  QD.  Will refill rx.

## 2020-03-27 ENCOUNTER — Other Ambulatory Visit: Payer: Self-pay | Admitting: Internal Medicine

## 2020-03-31 ENCOUNTER — Other Ambulatory Visit: Payer: Self-pay | Admitting: Internal Medicine

## 2020-05-07 ENCOUNTER — Ambulatory Visit: Payer: Medicare Other | Admitting: Internal Medicine

## 2020-05-07 ENCOUNTER — Other Ambulatory Visit: Payer: Self-pay

## 2020-05-07 ENCOUNTER — Encounter: Payer: Self-pay | Admitting: Internal Medicine

## 2020-05-07 VITALS — BP 120/78 | HR 81 | Ht 75.0 in | Wt 240.4 lb

## 2020-05-07 DIAGNOSIS — E7800 Pure hypercholesterolemia, unspecified: Secondary | ICD-10-CM | POA: Diagnosis not present

## 2020-05-07 DIAGNOSIS — E663 Overweight: Secondary | ICD-10-CM | POA: Diagnosis not present

## 2020-05-07 DIAGNOSIS — E1165 Type 2 diabetes mellitus with hyperglycemia: Secondary | ICD-10-CM

## 2020-05-07 DIAGNOSIS — E1151 Type 2 diabetes mellitus with diabetic peripheral angiopathy without gangrene: Secondary | ICD-10-CM | POA: Diagnosis not present

## 2020-05-07 DIAGNOSIS — IMO0002 Reserved for concepts with insufficient information to code with codable children: Secondary | ICD-10-CM

## 2020-05-07 LAB — POCT GLYCOSYLATED HEMOGLOBIN (HGB A1C): Hemoglobin A1C: 6.6 % — AB (ref 4.0–5.6)

## 2020-05-07 NOTE — Progress Notes (Addendum)
Patient ID: [name], male   DOB: Nov 24, 1945, 74 y.o.   MRN: [id number]  This visit occurred during the SARS-CoV-2 public health emergency.  Safety protocols were in place, including screening questions prior to the visit, additional usage of staff PPE, and extensive cleaning of exam room while observing appropriate contact time as indicated for disinfecting solutions.   HPI: [name] is a 74 y.o.-year-old male, returning for f/u for DM2, dx 1995, insulin-dependent, uncontrolled, with complications (CKD stage 3, CAD - AMI, s/p stent LAD 2001). Last visit 4 months ago.  He is here with his wife, who is also my patient, and offers part of the history especially related to his blood sugars, insulin doses, and past medical history.  He had [id number] complicated with pneumonia in 05/2019.  At that time, he was admitted with A. fib with RVR and was treated with remdesivir, convalescent plasma, dexamethasone.  History recently was held in the hospital and was discharged without it.  Sugars started to increase afterwards.  However, we changed back his regimen to the previous regimen, before his admission and sugars started to improve afterwards.   Reviewed HbA1c levels: Lab Results  Component Value Date   HGBA1C 7.0 (A) 01/01/2020   HGBA1C 6.6 (A) 08/07/2019   HGBA1C 7.3 (H) 06/12/2019   He is on: - Trulicity 1.5  mg weekly >> 3 mg weekly  - Insulin 70/30 insulin 32 units 2x a day >> 32 >> 30 units in a.m. and 36 units before dinner - Glipizide 5 mg 15-30 min before b'fast and dinner - restarted 12/2019 He was on the insulin 70/30 before admission for COVID-19 + pneumonia in 05/2019. We tried low-dose metformin ER several times, but he could not tolerate it due to diarrhea >> now diarrhea is better He was on Tanzeum 50 mg weekly (started 09/2015) - >200$ per month! >> Trulicity 1.5 mg weekly He was previously on glipizide-metformin but could not tolerate it due to diarrhea -stopped  08/2017  Pt checks his sugars twice a day: - am: 133-163, 178 >> 110, 138-196 >> 104-172, 182, 215 - 2h after b'fast: 131 >> n/c >> 223 >> n/c >> 219 >> n/c - before lunch: 198, 215 >> n/c - 2h after lunch: 202, 237 >> n/c >> 162-202 >> 185-226 - before dinner: 115, 119, 146-247, 260 >> 67, 79-175, 187 - 2h after dinner:  n/c >> 176 >> 196 >> n/c >> 179, 180 - bedtime: 212 >> 142-203 >> 132, 150 >> n/c >> 179, 180 - nighttime:43-67, 156 >> 74-109 >> 90, 120 >> n/c >> 105-186 >> 63 x1, 105-161 Lowest sugar was 59 at bedtime ...  >> 110 >> 63; he does not have hypoglycemia awareness! Highest sugar was 241 >> 260 >> 226  Pt's meals are: - Breakfast: eggs, toast, ham/sausage or cereals + milk - Lunch: sandwich, vegetables, meat - Dinner: seafood, meat, vegetables - Snacks: nabs  Reviewed previous labs: 04/30/2019: Lipids: 146/55/49/85 CMP normal, except BUN/creatinine 18/1.6, GFR 42.  His glucose was 84. PSA 0.6  04/26/2018: CMP normal with the exception of a slightly high glucose of 124, BUN/creatinine 19/1.5, GFR 46 and slightly higher chloride, of 107. Lipids: 147/58/47/88 CBC with slightly low red blood cells of 4.08 (4.14-5.8), normal hemoglobin of 13.3 and slightly high MCV of 99 (79-97) PSA 0.8  04/13/2017: - Lipids: 124/65/39/72 - CMP normal, glucose 98, BUN/creatinine 10/1.3, GFR 55, potassium 3.4 (3.5-5.2), Ca 8.1 (8.6-10.2) - CBC with differential: low Hb 11.3 (  13-17.7), low RBC 3.57 (4.14-5.8) - ferritin 167 - PSA 0.5  09/30/2015: - Hep C virus antibody <0.1 - Lipids: 126/68/44/68 - CMP normal, except glucose 209, BUN/creatinine 22/1.39, GFR 51, potassium 5.4 (3.5-5.2), CO2 17 (18-29) - CBC with differential normal - B12 vitamin 305 [id number])  04/01/2015: - ACR 56.3 - CBC with differential normal - Lipids: 116/56/42/63 - CMP: Glucose 193, BUN/creatinine 21/1.23, GFR 60, LFTs normal - PSA 0.5    -He has CKD stage III: Lab Results  Component Value Date    BUN 29 (H) 06/16/2019   CREATININE 1.32 (H) 06/16/2019   -+ HL.  On Lipitor 20.  He is on Neurontin.  - last eye exam was on 05/21/2019: No DR - nonumbness and tingling in his feet.   He has a history of diverticulitis (sees Dr. Calianne Larue Gong).  He had a colectomy in 05/2017 and had to have a colostomy bag.  He was feeling better after his colectomy.  He had hernia repair surgery and reversal of his ostomy 03/08/2018.  ROS: Constitutional: no weight gain/no weight loss, no fatigue, no subjective hyperthermia, no subjective hypothermia Eyes: no blurry vision, no xerophthalmia ENT: no sore throat, no nodules palpated in neck, no dysphagia, no odynophagia, no hoarseness Cardiovascular: no CP/no SOB/no palpitations/no leg swelling Respiratory: no cough/no SOB/no wheezing Gastrointestinal: no N/no V/no D/no C/no acid reflux Musculoskeletal: no muscle aches/no joint aches Skin: no rashes, no hair loss Neurological: no tremors/no numbness/no tingling/no dizziness  I reviewed pt's medications, allergies, PMH, social hx, family hx, and changes were documented in the history of present illness. Otherwise, unchanged from my initial visit note.  Past Medical History:  Diagnosis Date  . AAA (abdominal aortic aneurysm) ([place])   . Allergy   . Atrial fibrillation [facility]) October 2013   Xarelto started; duration unknown  . Cataract   . Chronic kidney disease    Stage III   . Coronary artery disease    post bare-metal stent to LAD in 2001  . Diabetes mellitus    type 2   . Hypertension   . Myocardial infarct ([place]) 2001  . Obesity    Past Surgical History:  Procedure Laterality Date  . CARDIAC CATHETERIZATION  2006   stable with primarily nonobstructive disease  . CARDIOVERSION  03/27/2012   Procedure: CARDIOVERSION;  Surgeon: Carlena Bjornstad, MD;  Location: [facility] ENDOSCOPY;  Service: Cardiovascular;  Laterality: N/A;  . COLOSTOMY TAKEDOWN N/A 03/08/2018   Procedure: LAPAROSCOPIC HARTMANNS REVERSAL;   Surgeon: Ileana Roup, MD;  Location: [facility] ORS;  Service: General;  Laterality: N/A;  . CORONARY ANGIOPLASTY WITH STENT PLACEMENT  2001   bare-metal stent to the LAD  . CYSTOSCOPY W/ URETERAL STENT PLACEMENT Bilateral 03/08/2018   Procedure: CYSTOSCOPY WITH RETROGRADE AND BILATERAL URETERAL STENT PLACEMENT;  Surgeon: Lucas Mallow, MD;  Location: [facility] ORS;  Service: Urology;  Laterality: Bilateral;  . CYSTOSCOPY WITH STENT PLACEMENT Bilateral 06/03/2017   Procedure: CYSTOSCOPY WITH STENT PLACEMENT;  Surgeon: Festus Aloe, MD;  Location: [facility] ORS;  Service: Urology;  Laterality: Bilateral;  . FLEXIBLE SIGMOIDOSCOPY N/A 03/08/2018   Procedure: FLEXIBLE SIGMOIDOSCOPY;  Surgeon: Ileana Roup, MD;  Location: [facility] ORS;  Service: General;  Laterality: N/A;  . HERNIA REPAIR  2006  . LAPAROSCOPIC PARASTOMAL HERNIA N/A 03/08/2018   Procedure: PARASTOMAL HERNIA REPAIR;  Surgeon: Ileana Roup, MD;  Location: [facility] ORS;  Service: General;  Laterality: N/A;  . LAPAROSCOPIC SIGMOID COLECTOMY N/A 06/03/2017   Procedure: LAPAROSCOPIC  SIGMOID COLECTOMY,  COLOSTOMY;  Surgeon: Ileana Roup, MD;  Location: [facility] ORS;  Service: General;  Laterality: N/A;  . SKIN GRAFT  1981   Social History Main Topics  . Smoking status: Former Smoker -- 0.50 packs/day for 40 years    Types: Cigarettes, Pipe, Cigars    Quit date: 05/11/1999  . Smokeless tobacco: Never Used  . Alcohol Use: No  . Drug Use: No   Social History Narrative   Married    Retired: [employer]   3 grown children   Current Outpatient Medications on File Prior to Visit  Medication Sig Dispense Refill  . acetaminophen (TYLENOL) 500 MG tablet Take 1,000 mg by mouth every 6 (six) hours as needed for mild pain, fever or headache.    Marland Kitchen ascorbic acid (VITAMIN C) 500 MG tablet Take 1 tablet (500 mg total) by mouth daily. 30 tablet 0  . atorvastatin (LIPITOR) 20 MG tablet Take 20 mg by mouth at bedtime.     Marland Kitchen glipiZIDE (GLUCOTROL) 5 MG  tablet Take 1 tablet (5 mg total) by mouth 2 (two) times daily before a meal. 180 tablet 3  . HUMULIN 70/30 KWIKPEN (70-30) 100 UNIT/ML KwikPen INJECT SUBCUTANEOUSLY 25  UNITS BEFORE BREAKFAST AND  25 UNITS BEFORE DINNER 45 mL 3  . insulin NPH-regular Human (70-30) 100 UNIT/ML injection Inject 32-36 Units into the skin in the morning and at bedtime.    . metoprolol tartrate (LOPRESSOR) 25 MG tablet Take 0.5 tablets (12.5 mg total) by mouth 2 (two) times daily. 30 tablet 0  . nitroGLYCERIN (NITROSTAT) 0.4 MG SL tablet Place 0.4 mg under the tongue every 5 (five) minutes as needed for chest pain.     Marland Kitchen omeprazole (PRILOSEC) 20 MG capsule Take 20 mg by mouth daily as needed (for acid reflux/indigestion).     . RELION PEN NEEDLES 32G X 4 MM MISC USE 1 SYRINGE  TWICE DAILY AS NEEDED 100 each 11  . TRULICITY 3 0000000 SOPN INJECT THE CONTENTS OF 1  PEN (3MG ) SUBCUTANEOUSLY  WEEKLY AS DIRECTED 6 mL 3  . XARELTO 20 MG TABS tablet TAKE 1 TABLET BY MOUTH EVERY DAY 90 tablet 1   No current facility-administered medications on file prior to visit.   No Known Allergies Family History  Problem Relation Age of Onset  . Heart disease Father   . Heart attack Father   . ALS Mother   . Diabetes Sister   . Hypertension Sister   . Heart failure Brother   . Stroke Neg Hx    PE: BP 120/78   Pulse 81   Ht 6\' 3"  (1.905 m)   Wt 240 lb 6.4 oz (109 kg)   SpO2 96%   BMI 30.05 kg/m  Body mass index is 30.05 kg/m. Wt Readings from Last 3 Encounters:  05/07/20 240 lb 6.4 oz (109 kg)  01/25/20 240 lb (108.9 kg)  01/21/20 242 lb (109.8 kg)   Constitutional: overweight, in NAD Eyes: PERRLA, EOMI, no exophthalmos ENT: moist mucous membranes, no thyromegaly, no cervical lymphadenopathy Cardiovascular: Irregularly irregular rhythm, RR, No MRG Respiratory: CTA B Gastrointestinal: abdomen soft, NT, ND, BS+ Musculoskeletal: no deformities, strength intact in all 4 Skin: moist, warm, no rashes Neurological: no  tremor with outstretched hands, DTR normal in all 4  ASSESSMENT: 1. DM2, insulin-dependent, uncontrolled, with complications - CAD - CKD stage 3  2. HL  3.  Overweight  PLAN:  1. Patient with longstanding, previously uncontrolled type 2 diabetes, with improved control after starting  a GLP-1 receptor agonist.  His Trulicity was held at the beginning of the year, when he was hospitalized for COVID-19, and sugars increased afterwards.  We were able to restart Trulicity and sugars improved to an HbA1c of 6.6%.  At last visit, however the blood sugars are higher with spikes in the upper 200s.  This was correlated with a 15 pound weight loss in the previous 5 months.  We discussed about improving diet and the need to lose weight but at that time we also added glipizide before breakfast and dinner and I advised him that if the sugars did not improve within the following 2 weeks, to increase both doses of premixed insulin by 4 units.  HbA1c at that time was 7%, higher. -At today's visit, sugars are improved in the morning, but many of them are still above target.  Upon questioning, he is having snacks at night, which I advised him to stop.  He had one low blood sugar at night, at 63, since last visit, almost 2 months ago.  Therefore, we have to be careful with his insulin doses, but for now, I would recommend to continue the current nighttime dose.  Sugars are also high after lunch and I believe that this is related to eating his lunch close to breakfast. Wife is telling me that they have b'fast around 10 and lunch ~2h later.  We discussed about ideally spacing these meals out by 5 hours. Sugars improve later in the day with only occasional values above target. For now, I advised him to continue the current regimen, but to let me know if he develops any more lows, in which case, will need to back off the insulin, glipizide, or both. - I suggested to:  Patient Instructions  Please continue: - Trulicity 3 mg  weekly - 70/30 insulin 30 units before b'fast and 36 units before dinner - Glipizide 5 mg 15-30 min before b'fast and dinner  Please return in 4 months with your sugar log.   - we checked his HbA1c: 6.6% (lower) - advised to check sugars at different times of the day - 2-3x a day, rotating check times - advised for yearly eye exams >> he is UTD - return to clinic in 4 months    2. HL -Reviewed latest lipid panel from 04/2019: LDL above our goal of less than 70: 146/55/49/85 -He continues on Lipitor 20 without side effects. -He has a new appointment with PCP coming up and he will have him send me the results  3.  Overweight -Continue Trulicity, which should also help with weight loss -He gained approximately 15 pounds in the 5 months before last visit, previously lost 18 pounds after he restarted Trulicity -At last visit we discussed about the importance of improving his diet.  At today's visit, we again discussed about how to improve his diet.  I strongly advised him to cut out the late night snack and to move lunch later or breakfast earlier -Weight is stable since last visit  Addendum (05/23/2020): Received labs from PCP drawn on 11/01/2019: Lipids: 128/52/49/67 Glucose 143, BUN/creatinine 16/1.48, GFR 46, CMP otherwise normal  Addendum (05/29/2020): Received labs from PCP drawn on 05/22/2020: Lipids: 140/50/53/75 Glucose 131, BUN/creatinine 16/1.68, GFR 42  Carlus Pavlov, MD PhD [facility] Endocrinology

## 2020-05-07 NOTE — Patient Instructions (Addendum)
Please continue: - Trulicity 3 mg weekly - 70/30 insulin 30 units before b'fast and 36 units before dinner - Glipizide 5 mg 15-30 min before b'fast and dinner  Please return in 4 months with your sugar log.

## 2020-06-05 LAB — HM DIABETES EYE EXAM

## 2020-07-06 ENCOUNTER — Other Ambulatory Visit: Payer: Self-pay | Admitting: Cardiovascular Disease

## 2020-08-13 ENCOUNTER — Other Ambulatory Visit: Payer: Self-pay | Admitting: Physician Assistant

## 2020-08-13 DIAGNOSIS — R1110 Vomiting, unspecified: Secondary | ICD-10-CM

## 2020-08-21 ENCOUNTER — Other Ambulatory Visit: Payer: Self-pay | Admitting: Cardiovascular Disease

## 2020-08-21 NOTE — Telephone Encounter (Signed)
Prescription refill request for Xarelto received.  Indication: Atrial Fib Last office visit: 01/21/20 Weight: 109.8kg Age: 75 Scr: 1.58 on 05/22/20 CrCl: 63.70  Based on above findings Xarelto 20mg  daily is the appropriate dose.  Refill approved.

## 2020-08-28 ENCOUNTER — Other Ambulatory Visit: Payer: Medicare Other

## 2020-09-02 ENCOUNTER — Ambulatory Visit
Admission: RE | Admit: 2020-09-02 | Discharge: 2020-09-02 | Disposition: A | Discharge status: Home / Self Care | Payer: Medicare Other | Source: Ambulatory Visit | Attending: Physician Assistant | Admitting: Physician Assistant

## 2020-09-02 ENCOUNTER — Other Ambulatory Visit: Payer: Self-pay

## 2020-09-02 DIAGNOSIS — R1110 Vomiting, unspecified: Secondary | ICD-10-CM

## 2020-09-05 ENCOUNTER — Ambulatory Visit: Payer: Medicare Other | Admitting: Internal Medicine

## 2020-09-05 ENCOUNTER — Other Ambulatory Visit: Payer: Self-pay

## 2020-09-05 ENCOUNTER — Encounter: Payer: Self-pay | Admitting: Internal Medicine

## 2020-09-05 VITALS — BP 120/78 | HR 75 | Ht 75.0 in | Wt 242.4 lb

## 2020-09-05 DIAGNOSIS — E1151 Type 2 diabetes mellitus with diabetic peripheral angiopathy without gangrene: Secondary | ICD-10-CM | POA: Diagnosis not present

## 2020-09-05 DIAGNOSIS — E663 Overweight: Secondary | ICD-10-CM | POA: Diagnosis not present

## 2020-09-05 DIAGNOSIS — E7800 Pure hypercholesterolemia, unspecified: Secondary | ICD-10-CM | POA: Diagnosis not present

## 2020-09-05 DIAGNOSIS — IMO0002 Reserved for concepts with insufficient information to code with codable children: Secondary | ICD-10-CM

## 2020-09-05 DIAGNOSIS — E1165 Type 2 diabetes mellitus with hyperglycemia: Secondary | ICD-10-CM | POA: Diagnosis not present

## 2020-09-05 LAB — POCT GLYCOSYLATED HEMOGLOBIN (HGB A1C): Hemoglobin A1C: 6.6 % — AB (ref 4.0–5.6)

## 2020-09-05 NOTE — Patient Instructions (Addendum)
Please continue: - Trulicity 3 mg weekly - 70/30 insulin 30 units before b'fast and 36 units before dinner  Please change: - Glipizide 5 mg 15-30 min before b'fast (stop the evening Glipizide)  If the sugars continue to drop overnight, then decrease 70/30 insulin at night to 30.  Please return in 4 months with your sugar log.

## 2020-09-05 NOTE — Progress Notes (Signed)
Patient ID: [name], male   DOB: Dec 05, 1945, 75 y.o.   MRN: [id number]  This visit occurred during the SARS-CoV-2 public health emergency.  Safety protocols were in place, including screening questions prior to the visit, additional usage of staff PPE, and extensive cleaning of exam room while observing appropriate contact time as indicated for disinfecting solutions.   HPI: [name] is a 75 y.o.-year-old male, returning for f/u for DM2, dx 1995, insulin-dependent, uncontrolled, with complications (CKD stage 3, CAD - AMI, s/p stent LAD 2001). Last visit 4 months ago.  He is here with his wife, who is also my patient, and offers part of the history especially related to his blood sugars, insulin doses, and past medical history.  Interim history: No increased urination, blurry vision, nausea. He has mm cramps. He was dx'ed with pancreatic insufficiency by Dr. Azhar Knope Gong. He has occasional diarrhea. Started on Creon but $$$. Zenpep was tried but this was even more $$$.  They did go ahead and buy Creon.  This is helping.  Reviewed HbA1c levels: Lab Results  Component Value Date   HGBA1C 6.6 (A) 05/07/2020   HGBA1C 7.0 (A) 01/01/2020   HGBA1C 6.6 (A) 08/07/2019   He is on: - Trulicity 1.5  mg weekly >> 3 mg weekly  - Insulin 70/30 insulin 32 units 2x a day >> 32 >> 30 units in a.m. and 36 units before dinner - Glipizide 5 mg 15-30 min before b'fast and dinner - restarted 12/2019 He was on the insulin 70/30 before admission for COVID-19 + pneumonia in 05/2019. We tried low-dose metformin ER several times, but he could not tolerate it due to diarrhea >> now diarrhea is better He was on Tanzeum 50 mg weekly (started 09/2015) - >200$ per month! >> Trulicity 1.5 mg weekly He was previously on glipizide-metformin but could not tolerate it due to diarrhea -stopped 08/2017  Pt checks his sugars twice a day: - am: 133-163, 178 >> 110, 138-196 >> 104-172, 182, 215 >> 104-191, 241 - 2h after  b'fast: 131 >> n/c >> 223 >> n/c >> 219 >> n/c >> 195 - before lunch: 198, 215 >> n/c - 2h after lunch: 202, 237 >> n/c >> 162-202 >> 185-226 >> 166-226 - before dinner: 115, 119, 146-247, 260 >> 67, 79-175, 187 >> 65, 121-209 - 2h after dinner:  n/c >> 176 >> 196 >> n/c >> 179, 180 >> n/c - bedtime: 212 >> 142-203 >> 132, 150 >> n/c >> 179, 180 >> 181 - nighttime:90, 120 >> n/c >> 105-186 >> 63 x1, 105-161 >> 58 x2, 70, 71, 98, 182 Lowest sugar was 59 at bedtime ...  >> 110 >> 63 >> 58 (diarrhea); he does not have hypoglycemia awareness! Highest sugar was 241 >> 260 >> 226  Pt's meals are: - Breakfast: eggs, toast, ham/sausage or cereals + milk - Lunch: sandwich, vegetables, meat - Dinner: seafood, meat, vegetables - Snacks: nabs  Reviewed previous labs: 05/22/2020: Lipids: 140/50/53/75 Glucose 131, BUN/creatinine 16/1.68, GFR 42  11/01/2019: Lipids: 128/52/49/67 Glucose 143, BUN/creatinine 16/1.48, GFR 46, CMP otherwise normal  04/30/2019: Lipids: 146/55/49/85 CMP normal, except BUN/creatinine 18/1.6, GFR 42.  His glucose was 84. PSA 0.6  04/26/2018: CMP normal with the exception of a slightly high glucose of 124, BUN/creatinine 19/1.5, GFR 46 and slightly higher chloride, of 107. Lipids: 147/58/47/88 CBC with slightly low red blood cells of 4.08 (4.14-5.8), normal hemoglobin of 13.3 and slightly high MCV of 99 (79-97) PSA 0.8  04/13/2017: -  Lipids: 124/65/39/72 - CMP normal, glucose 98, BUN/creatinine 10/1.3, GFR 55, potassium 3.4 (3.5-5.2), Ca 8.1 (8.6-10.2) - CBC with differential: low Hb 11.3 (13-17.7), low RBC 3.57 (4.14-5.8) - ferritin 167 - PSA 0.5  09/30/2015: - Hep C virus antibody <0.1 - Lipids: 126/68/44/68 - CMP normal, except glucose 209, BUN/creatinine 22/1.39, GFR 51, potassium 5.4 (3.5-5.2), CO2 17 (18-29) - CBC with differential normal - B12 vitamin 305 (02-725)  04/01/2015: - ACR 56.3 - CBC with differential normal - Lipids: 116/56/42/63 - CMP:  Glucose 193, BUN/creatinine 21/1.23, GFR 60, LFTs normal - PSA 0.5    -He has CKD stage III.  -+ HL.  On Lipitor 20.  He is on Neurontin.  - last eye exam was on 06/05/2020: No DR - nonumbness and tingling in his feet.   He has a history of diverticulitis (sees Dr. Lashawne Dura Gong).  He had a colectomy in 05/2017 and had to have a colostomy bag.  He was feeling better after his colectomy.  He had hernia repair surgery and reversal of his ostomy 03/08/2018.  He had [id number] complicated with pneumonia in 05/2019.  At that time, he was admitted with A. fib with RVR and was treated with remdesivir, convalescent plasma, dexamethasone.   ROS: Constitutional: no weight gain/no weight loss, no fatigue, no subjective hyperthermia, no subjective hypothermia Eyes: no blurry vision, no xerophthalmia ENT: no sore throat, no nodules palpated in neck, no dysphagia, no odynophagia, no hoarseness Cardiovascular: no CP/no SOB/no palpitations/no leg swelling Respiratory: no cough/no SOB/no wheezing Gastrointestinal: no N/no V/+ D/no C/no acid reflux Musculoskeletal: no muscle aches/no joint aches Skin: no rashes, no hair loss Neurological: no tremors/no numbness/no tingling/no dizziness  I reviewed pt's medications, allergies, PMH, social hx, family hx, and changes were documented in the history of present illness. Otherwise, unchanged from my initial visit note.  Past Medical History:  Diagnosis Date  . AAA (abdominal aortic aneurysm) ([place])   . Allergy   . Atrial fibrillation [facility]) October 2013   Xarelto started; duration unknown  . Cataract   . Chronic kidney disease    Stage III   . Coronary artery disease    post bare-metal stent to LAD in 2001  . Diabetes mellitus    type 2   . Hypertension   . Myocardial infarct ([place]) 2001  . Obesity    Past Surgical History:  Procedure Laterality Date  . CARDIAC CATHETERIZATION  2006   stable with primarily nonobstructive disease  . CARDIOVERSION   03/27/2012   Procedure: CARDIOVERSION;  Surgeon: Carlena Bjornstad, MD;  Location: [facility] ENDOSCOPY;  Service: Cardiovascular;  Laterality: N/A;  . COLOSTOMY TAKEDOWN N/A 03/08/2018   Procedure: LAPAROSCOPIC HARTMANNS REVERSAL;  Surgeon: Ileana Roup, MD;  Location: [facility] ORS;  Service: General;  Laterality: N/A;  . CORONARY ANGIOPLASTY WITH STENT PLACEMENT  2001   bare-metal stent to the LAD  . CYSTOSCOPY W/ URETERAL STENT PLACEMENT Bilateral 03/08/2018   Procedure: CYSTOSCOPY WITH RETROGRADE AND BILATERAL URETERAL STENT PLACEMENT;  Surgeon: Lucas Mallow, MD;  Location: [facility] ORS;  Service: Urology;  Laterality: Bilateral;  . CYSTOSCOPY WITH STENT PLACEMENT Bilateral 06/03/2017   Procedure: CYSTOSCOPY WITH STENT PLACEMENT;  Surgeon: Festus Aloe, MD;  Location: [facility] ORS;  Service: Urology;  Laterality: Bilateral;  . FLEXIBLE SIGMOIDOSCOPY N/A 03/08/2018   Procedure: FLEXIBLE SIGMOIDOSCOPY;  Surgeon: Ileana Roup, MD;  Location: [facility] ORS;  Service: General;  Laterality: N/A;  . HERNIA REPAIR  2006  . LAPAROSCOPIC PARASTOMAL HERNIA N/A 03/08/2018  Procedure: PARASTOMAL HERNIA REPAIR;  Surgeon: Ileana Roup, MD;  Location: [facility] ORS;  Service: General;  Laterality: N/A;  . LAPAROSCOPIC SIGMOID COLECTOMY N/A 06/03/2017   Procedure: LAPAROSCOPIC  SIGMOID COLECTOMY, COLOSTOMY;  Surgeon: Ileana Roup, MD;  Location: [facility] ORS;  Service: General;  Laterality: N/A;  . SKIN GRAFT  1981   Social History Main Topics  . Smoking status: Former Smoker -- 0.50 packs/day for 40 years    Types: Cigarettes, Pipe, Cigars    Quit date: 05/11/1999  . Smokeless tobacco: Never Used  . Alcohol Use: No  . Drug Use: No   Social History Narrative   Married    Retired: [employer]   3 grown children   Current Outpatient Medications on File Prior to Visit  Medication Sig Dispense Refill  . acetaminophen (TYLENOL) 500 MG tablet Take 1,000 mg by mouth every 6 (six) hours as needed for mild pain, fever  or headache.    Marland Kitchen ascorbic acid (VITAMIN C) 500 MG tablet Take 1 tablet (500 mg total) by mouth daily. 30 tablet 0  . atorvastatin (LIPITOR) 20 MG tablet Take 20 mg by mouth at bedtime.     Marland Kitchen glipiZIDE (GLUCOTROL) 5 MG tablet Take 1 tablet (5 mg total) by mouth 2 (two) times daily before a meal. 180 tablet 3  . HUMULIN 70/30 KWIKPEN (70-30) 100 UNIT/ML KwikPen INJECT SUBCUTANEOUSLY 25  UNITS BEFORE BREAKFAST AND  25 UNITS BEFORE DINNER 45 mL 3  . insulin NPH-regular Human (70-30) 100 UNIT/ML injection Inject 32-36 Units into the skin in the morning and at bedtime.    . metoprolol tartrate (LOPRESSOR) 25 MG tablet TAKE ONE-HALF TABLET BY  MOUTH TWICE DAILY 90 tablet 1  . nitroGLYCERIN (NITROSTAT) 0.4 MG SL tablet Place 0.4 mg under the tongue every 5 (five) minutes as needed for chest pain.     Marland Kitchen omeprazole (PRILOSEC) 20 MG capsule Take 20 mg by mouth daily as needed (for acid reflux/indigestion).     . RELION PEN NEEDLES 32G X 4 MM MISC USE 1 SYRINGE  TWICE DAILY AS NEEDED 100 each 11  . TRULICITY 3 IO/9.7DZ SOPN INJECT THE CONTENTS OF 1  PEN (3MG ) SUBCUTANEOUSLY  WEEKLY AS DIRECTED 6 mL 3  . XARELTO 20 MG TABS tablet TAKE 1 TABLET BY MOUTH EVERY DAY 90 tablet 1   No current facility-administered medications on file prior to visit.   No Known Allergies Family History  Problem Relation Age of Onset  . Heart disease Father   . Heart attack Father   . ALS Mother   . Diabetes Sister   . Hypertension Sister   . Heart failure Brother   . Stroke Neg Hx    PE: BP 120/78 (BP Location: Left Arm, Patient Position: Sitting, Cuff Size: Normal)   Pulse 75   Ht 6\' 3"  (1.905 m)   Wt 242 lb 6.4 oz (110 kg)   SpO2 96%   BMI 30.30 kg/m  Body mass index is 30.3 kg/m. Wt Readings from Last 3 Encounters:  09/05/20 242 lb 6.4 oz (110 kg)  05/07/20 240 lb 6.4 oz (109 kg)  01/25/20 240 lb (108.9 kg)   Constitutional: overweight, in NAD Eyes: PERRLA, EOMI, no exophthalmos ENT: moist mucous membranes,  no thyromegaly, no cervical lymphadenopathy Cardiovascular: Irregularly irregular rhythm, RR, No MRG Respiratory: CTA B Gastrointestinal: abdomen soft, NT, ND, BS+ Musculoskeletal: no deformities, strength intact in all 4 Skin: moist, warm, no rashes Neurological: no tremor with outstretched hands, DTR  normal in all 4  ASSESSMENT: 1. DM2, insulin-dependent, uncontrolled, with complications - CAD - CKD stage 3  2. HL  3.  Overweight  PLAN:  1. Patient with longstanding, previously uncontrolled type 2 diabetes, with improved control after adding a GLP-1 receptor agonist.  At last visit, HbA1c was improved, to 6.6%, from 7.0%.  At that time, he was having breakfast to close to lunch, only separated by 2 hours, and we discussed about ideally spacing these meals out by 5 hours.  Sugars were high after lunch but they were improving later in the day, with only occasional values above target.  We did not change the regimen otherwise. -At today's visit, sugars are slightly elevated at all times of the day, however, he does have occasional blood sugars in the 60s, 70s, and even 50s especially during the night.  He does not associate these with changes made in his diet or activity the night before.  Therefore, at this visit, I suggested that he stopped glipizide before dinner.  I advised him that if sugars continue to drop during the night to back off the 70/30 insulin dose before dinner. - I suggested to:  Patient Instructions  Please continue: - Trulicity 3 mg weekly - 70/30 insulin 30 units before b'fast and 36 units before dinner  Please change: - Glipizide 5 mg 15-30 min before b'fast (stop the evening Glipizide)  If the sugars continue to drop overnight, then decrease 70/30 insulin at night to 30.  Please return in 4 months with your sugar log.   - we checked his HbA1c: 6.6% (stable) - advised to check sugars at different times of the day - 2-3x a day, rotating check times - advised for  yearly eye exams >> he is UTD - return to clinic in 4 months   2. HL -Reviewed latest lipid panel from 05/2020: 140/50/53/75- LDL slightly elevated, above our target of less than 70 -He continues on Lipitor 20 mg daily without side effects  3.  Overweight -We will continue the GLP-1 receptor agonist, which should also help with weight loss -He previously lost 18 pounds after he started Trulicity. -Last visit, we discussed about the importance of improving his diet and also cutting out the late night snack -Weight was stable at last visit, now gained 2 pounds since last visit   Philemon Kingdom, MD PhD [facility]

## 2020-10-05 ENCOUNTER — Other Ambulatory Visit: Payer: Self-pay

## 2020-10-05 DIAGNOSIS — I714 Abdominal aortic aneurysm, without rupture, unspecified: Secondary | ICD-10-CM

## 2020-11-14 ENCOUNTER — Other Ambulatory Visit: Payer: Self-pay | Admitting: Cardiovascular Disease

## 2020-12-04 ENCOUNTER — Telehealth: Payer: Self-pay | Admitting: Cardiovascular Disease

## 2020-12-04 NOTE — Telephone Encounter (Signed)
Aware I will send this to Michalene to address overdue follow up needed. Wife reports that she has been calling every 2 weeks since May to try and schedule pt, she was told to keep calling  back by our schedulers but she says schedule is not open every time she calls back. Aware that reported issues would be better suited discussing w/ PCP, occurrence is not heart related.   Aware Michalene would follow up. She is agreeable

## 2020-12-04 NOTE — Telephone Encounter (Signed)
[name]'s wife is calling stating they had to take [name] to urgent care on 12/02/20 due to him complaining of pain in his stomach, side of neck and back when he took a deep breathe. She reports at the urgent care he was rambling and slow to respond. He was able to tell them the day "07/26", but was unable to tell them the year. They reported they found he has had a stroke several years ago in the past and has held from it. [name] states they were not aware of this and wanted to notify Dr. Angelena Form. This was at [facility] urgent care and [facility]. Please advise.

## 2020-12-09 NOTE — Telephone Encounter (Signed)
Pt was seen 01/2020.  Due for one year follow up per after visit summary.  He is not scheduled currently.  On wait list.  I have added him to my list to work in with Dr. Angelena Form.

## 2021-01-06 NOTE — Progress Notes (Signed)
Patient ID: [name], male   DOB: 1946/04/26, 75 y.o.   MRN: [id number]  This visit occurred during the SARS-CoV-2 public health emergency.  Safety protocols were in place, including screening questions prior to the visit, additional usage of staff PPE, and extensive cleaning of exam room while observing appropriate contact time as indicated for disinfecting solutions.   HPI: [name] is a 75 y.o.-year-old male, returning for f/u for DM2, dx 1995, insulin-dependent, uncontrolled, with complications (CKD stage 3, CAD - AMI, s/p stent LAD 2001). Last visit 4 months ago.  He is here with his wife, who is also my patient, and offers part of the history especially related to his blood sugars, insulin doses, and past medical history.  Interim history: No increased urination, blurry vision, nausea. Before last visit, he was diagnosed with pancreatic insufficiency by Dr. Jonee Lamore Gong.  He continues to have occasional diarrhea.  Creon is very expensive, but they did go ahead and buy it and this is helping. Still has diarrhea, though.  Reviewed HbA1c levels: Lab Results  Component Value Date   HGBA1C 6.6 (A) 09/05/2020   HGBA1C 6.6 (A) 05/07/2020   HGBA1C 7.0 (A) 01/01/2020   He is on: - Trulicity 1.5  mg weekly >> 3 mg weekly  - Insulin 70/30 insulin 32 units 2x a day >> 32 >> 30 units in a.m. and 36 units before dinner - Glipizide 5 mg 15-30 min before b'fast and dinner He was on the insulin 70/30 before admission for COVID-19 + pneumonia in 05/2019. We tried low-dose metformin ER several times, but he could not tolerate it due to diarrhea >> now diarrhea is better He was on Tanzeum 50 mg weekly (started 09/2015) - >200$ per month! >> Trulicity 1.5 mg weekly He was previously on glipizide-metformin but could not tolerate it due to diarrhea -stopped 08/2017  Pt checks his sugars twice a day: - am:  110, 138-196 >> 104-172, 182, 215 >> 104-191, 241 >> 146-186 - 2h after b'fast: n/c >> 223 >>  n/c >> 219 >> n/c >> 195 >> n/c - before lunch: 198, 215 >> n/c - 2h after lunch: n/c >> 162-202 >> 185-226 >> 166-226 >> 134-292 - before dinner: 67, 79-175, 187 >> 65, 121-209 >> 150-198, 244 - 2h after dinner: 196 >> n/c >> 179, 180 >> n/c - bedtime: 132, 150 >> n/c >> 179, 180 >> 181 >> n/c - nighttime:63 x1, 105-161 >> 58 x2, 70, 71, 98, 182 >> 47, 56, 57, 100-173 Lowest sugar was 59 at bedtime ...  >> 63 >> 58 (diarrhea) >> 47; he does not have hypoglycemia awareness! Highest sugar was 241 >> 260 >> 226 >> 186  Pt's meals are: - Breakfast: eggs, toast, ham/sausage or cereals + milk - Lunch: sandwich, vegetables, meat - Dinner: seafood, meat, vegetables - Snacks: nabs  Reviewed previous labs: 05/22/2020: Lipids: 140/50/53/75 Glucose 131, BUN/creatinine 16/1.68, GFR 42  11/01/2019: Lipids: 128/52/49/67 Glucose 143, BUN/creatinine 16/1.48, GFR 46, CMP otherwise normal  04/30/2019: Lipids: 146/55/49/85 CMP normal, except BUN/creatinine 18/1.6, GFR 42.  His glucose was 84. PSA 0.6  04/26/2018: CMP normal with the exception of a slightly high glucose of 124, BUN/creatinine 19/1.5, GFR 46 and slightly higher chloride, of 107. Lipids: 147/58/47/88 CBC with slightly low red blood cells of 4.08 (4.14-5.8), normal hemoglobin of 13.3 and slightly high MCV of 99 (79-97) PSA 0.8  04/13/2017: - Lipids: 124/65/39/72 - CMP normal, glucose 98, BUN/creatinine 10/1.3, GFR 55, potassium 3.4 (3.5-5.2), Ca  8.1 (8.6-10.2) - CBC with differential: low Hb 11.3 (13-17.7), low RBC 3.57 (4.14-5.8) - ferritin 167 - PSA 0.5  09/30/2015: - Hep C virus antibody <0.1 - Lipids: 126/68/44/68 - CMP normal, except glucose 209, BUN/creatinine 22/1.39, GFR 51, potassium 5.4 (3.5-5.2), CO2 17 (18-29) - CBC with differential normal - B12 vitamin 305 [id number])  04/01/2015: - ACR 56.3 - CBC with differential normal - Lipids: 116/56/42/63 - CMP: Glucose 193, BUN/creatinine 21/1.23, GFR 60, LFTs normal -  PSA 0.5    -He has CKD stage III.  -+ HL.  On Lipitor 20.  He is on Neurontin.  - last eye exam was on 06/05/2020: No DR  - nonumbness and tingling in his feet.   He has a history of diverticulitis (sees Dr. Floye Fesler Gong).  He had a colectomy in 05/2017 and had to have a colostomy bag.  He was feeling better after his colectomy.  He had hernia repair surgery and reversal of his ostomy 03/08/2018.  He had [id number] complicated with pneumonia in 05/2019.  At that time, he was admitted with A. fib with RVR and was treated with remdesivir, convalescent plasma, dexamethasone.   ROS: Constitutional: no weight gain/no weight loss, no fatigue, no subjective hyperthermia, no subjective hypothermia Eyes: no blurry vision, no xerophthalmia ENT: no sore throat, no nodules palpated in neck, no dysphagia, no odynophagia, no hoarseness Cardiovascular: no CP/no SOB/no palpitations/no leg swelling Respiratory: no cough/no SOB/no wheezing Gastrointestinal: no N/no V/+ D/no C/no acid reflux Musculoskeletal: no muscle aches/no joint aches Skin: no rashes, no hair loss Neurological: no tremors/no numbness/no tingling/no dizziness  I reviewed pt's medications, allergies, PMH, social hx, family hx, and changes were documented in the history of present illness. Otherwise, unchanged from my initial visit note.  Past Medical History:  Diagnosis Date   AAA (abdominal aortic aneurysm) ([employer])    Allergy    Atrial fibrillation [facility]) October 2013   Xarelto started; duration unknown   Cataract    Chronic kidney disease    Stage III    Coronary artery disease    post bare-metal stent to LAD in 2001   Diabetes mellitus    type 2    Hypertension    Myocardial infarct [facility]) 2001   Obesity    Past Surgical History:  Procedure Laterality Date   CARDIAC CATHETERIZATION  2006   stable with primarily nonobstructive disease   CARDIOVERSION  03/27/2012   Procedure: CARDIOVERSION;  Surgeon: Carlena Bjornstad, MD;   Location: [facility] ENDOSCOPY;  Service: Cardiovascular;  Laterality: N/A;   COLOSTOMY TAKEDOWN N/A 03/08/2018   Procedure: LAPAROSCOPIC HARTMANNS REVERSAL;  Surgeon: Ileana Roup, MD;  Location: [facility] ORS;  Service: General;  Laterality: N/A;   CORONARY ANGIOPLASTY WITH STENT PLACEMENT  2001   bare-metal stent to the LAD   CYSTOSCOPY W/ URETERAL STENT PLACEMENT Bilateral 03/08/2018   Procedure: CYSTOSCOPY WITH RETROGRADE AND BILATERAL URETERAL STENT PLACEMENT;  Surgeon: Lucas Mallow, MD;  [facility]: [facility]S;  Service: Urology;  Laterality: Bilateral;   CYSTOSCOPY WITH STENT PLACEMENT Bilateral 06/03/2017   Procedure: CYSTOSCOPY WITH STENT PLACEMENT;  Surgeon: Festus Aloe, MD;  [facility];  Service: Urology;  Laterality: Bilateral;   FLEXIBLE SIGMOIDOSCOPY N/A 03/08/2018   Procedure: FLEXIBLE SIGMOIDOSCOPY;  Surgeon: Ileana Roup, MD;  [facility]S;  Service: General;  Laterality: N/A;   HERNIA REPAIR  2006   LAPAROSCOPIC PARASTOMAL HERNIA N/A 03/08/2018   Procedure: PARASTOMAL HERNIA REPAIR;  Surgeon: Ileana Roup, MD;  Location: [facility];  Service: General;  Laterality: N/A;   LAPAROSCOPIC SIGMOID COLECTOMY N/A 06/03/2017   Procedure: LAPAROSCOPIC  SIGMOID COLECTOMY, COLOSTOMY;  Surgeon: Ileana Roup, MD;  [facility];  Service: General;  Laterality: N/A;   SKIN GRAFT  1981   Social History Main Topics   Smoking status: Former Smoker -- 0.50 packs/day for 40 years    Types: Cigarettes, Pipe, Cigars    Quit date: 05/11/1999   Smokeless tobacco: Never Used   Alcohol Use: No   Drug Use: No   Social History Narrative   Married    Retired: [employer]   3 grown children   Current Outpatient Medications on File Prior to Visit  Medication Sig Dispense Refill   acetaminophen (TYLENOL) 500 MG tablet Take 1,000 mg by mouth every 6 (six) hours as needed for mild pain, fever or headache.     ascorbic acid (VITAMIN C) 500 MG tablet Take 1 tablet (500 mg  total) by mouth daily. 30 tablet 0   atorvastatin (LIPITOR) 20 MG tablet Take 20 mg by mouth at bedtime.      glipiZIDE (GLUCOTROL) 5 MG tablet Take 1 tablet (5 mg total) by mouth 2 (two) times daily before a meal. 180 tablet 3   HUMULIN 70/30 KWIKPEN (70-30) 100 UNIT/ML KwikPen INJECT SUBCUTANEOUSLY 25  UNITS BEFORE BREAKFAST AND  25 UNITS BEFORE DINNER 45 mL 3   insulin NPH-regular Human (70-30) 100 UNIT/ML injection Inject 32-36 Units into the skin in the morning and at bedtime. (Patient not taking: Reported on 09/05/2020)     metoprolol tartrate (LOPRESSOR) 25 MG tablet TAKE ONE-HALF TABLET BY  MOUTH TWICE DAILY 90 tablet 3   nitroGLYCERIN (NITROSTAT) 0.4 MG SL tablet Place 0.4 mg under the tongue every 5 (five) minutes as needed for chest pain.      omeprazole (PRILOSEC) 20 MG capsule Take 20 mg by mouth daily as needed (for acid reflux/indigestion).      RELION PEN NEEDLES 32G X 4 MM MISC USE 1 SYRINGE  TWICE DAILY AS NEEDED 123XX123 each 11   TRULICITY 3 0000000 SOPN INJECT THE CONTENTS OF 1  PEN ('3MG'$ ) SUBCUTANEOUSLY  WEEKLY AS DIRECTED 6 mL 3   XARELTO 20 MG TABS tablet TAKE 1 TABLET BY MOUTH EVERY DAY 90 tablet 1   No current facility-administered medications on file prior to visit.   No Known Allergies Family History  Problem Relation Age of Onset   Heart disease Father    Heart attack Father    ALS Mother    Diabetes Sister    Hypertension Sister    Heart failure Brother    Stroke Neg Hx    PE: BP 132/84   Pulse 76   Ht 6' 1.5" (1.867 m)   Wt 246 lb (111.6 kg)   SpO2 97%   BMI 32.02 kg/m  Body mass index is 32.02 kg/m. Wt Readings from Last 3 Encounters:  01/07/21 246 lb (111.6 kg)  09/05/20 242 lb 6.4 oz (110 kg)  05/07/20 240 lb 6.4 oz (109 kg)   Constitutional: overweight, in NAD Eyes: PERRLA, EOMI, no exophthalmos ENT: moist mucous membranes, no thyromegaly, no cervical lymphadenopathy Cardiovascular: Irregularly irregular rhythm, RR, No MRG Respiratory: CTA  B Gastrointestinal: abdomen soft, NT, ND, BS+ Musculoskeletal: no deformities, strength intact in all 4 Skin: moist, warm, no rashes Neurological: no tremor with outstretched hands, DTR normal in all 4  ASSESSMENT: 1. DM2, insulin-dependent, uncontrolled, with complications - CAD - CKD stage 3  2. HL  3.  Overweight  PLAN:  1. Patient with longstanding, previously uncontrolled type 2 diabetes, with improved control after adding a GLP-1 receptor agonist.  At last visit, sugars were slightly elevated at all times of the day, but he had occasional blood sugars in the 60s or 70s but even rarely, 50s, especially during the night.  He did not associate these with changes made in his diet or activity.  Therefore, I suggested that he stop the glipizide before dinner and we discussed that if sugars continue to drop during the night to back off the 70/30 insulin before dinner.  HbA1c at last visit was excellent, at 6.6%, stable. -At today's visit, sugars appear to be higher than before, mostly in the 140-180 range before meals.  However, during the night, he continues to have low blood sugars, in the 40s and 50s.  Upon questioning, he did not decrease the dose of 70/30 insulin as advised at last visit.  Also, he tells me that he is still taking the glipizide before dinner, which I advised him to stop long time ago.  Therefore, for now, I advised him to stop the glipizide before dinner and we also discussed about switching from a premixed insulin regimen to a basal-bolus insulin regimen, which is more adjustable and less likely to cause low blood sugars.  I suggested [place] and Humalog.  He was on a similar regimen after discharge from the hospital, but is taking Levemir and Humalog.  The wife thinks that they still have some of this insulin at home so they can start right away.  I gave them a written prescription for Tresiba and Humalog.  For now, we will continue Trulicity. -[name] would also be  interested for him to start the freestyle libre CGM and we discussed about contacting the insurance to see to which suppliers should be sent the prescription. - I suggested to:  Patient Instructions  Please continue:  - Glipizide 5 mg 15-30 min before b'fast but stop the dinnertime dose - Trulicity 3 mg weekly  Stop: - 70/30 insulin   Start: - Tresiba 32 units daily - Humalog 8-12 units 15 min before the main meals  The most common suppliers for the continuous glucose monitor (FreeStyle [place] 2) are:  [facility] [phone], extension [phone]  -[employer] [phone]  -[place] [phone]    Please return in 4 months with your sugar log.   - we checked his HbA1c: 7.2% (higher) - advised to check sugars at different times of the day - 2-3x a day, rotating check times - advised for yearly eye exams >> he is UTD - return to clinic in 4 months  2. HL -Reviewed latest lipid panel from 05/2020: 140/50/53/75-LDL slightly elevated, but close to our target of less than 70 -He continues on Lipitor 20 mg daily without side effects  3.  Overweight -We will continue his GLP-1 receptor agonist, which should also help with weight loss -He previously lost 18 pounds after starting Trulicity -At last visit we discussed about cutting out the late night snack -He gained 2 pounds before last visit and gained 4 pounds since then.   Philemon Kingdom, MD PhD [facility]

## 2021-01-07 ENCOUNTER — Other Ambulatory Visit: Payer: Self-pay

## 2021-01-07 ENCOUNTER — Ambulatory Visit (INDEPENDENT_AMBULATORY_CARE_PROVIDER_SITE_OTHER): Payer: Medicare Other | Admitting: Internal Medicine

## 2021-01-07 ENCOUNTER — Encounter: Payer: Self-pay | Admitting: Internal Medicine

## 2021-01-07 ENCOUNTER — Telehealth: Payer: Self-pay | Admitting: Internal Medicine

## 2021-01-07 VITALS — BP 132/84 | HR 76 | Ht 73.5 in | Wt 246.0 lb

## 2021-01-07 DIAGNOSIS — E1151 Type 2 diabetes mellitus with diabetic peripheral angiopathy without gangrene: Secondary | ICD-10-CM

## 2021-01-07 DIAGNOSIS — E663 Overweight: Secondary | ICD-10-CM | POA: Diagnosis not present

## 2021-01-07 DIAGNOSIS — E7800 Pure hypercholesterolemia, unspecified: Secondary | ICD-10-CM | POA: Diagnosis not present

## 2021-01-07 DIAGNOSIS — E1165 Type 2 diabetes mellitus with hyperglycemia: Secondary | ICD-10-CM | POA: Diagnosis not present

## 2021-01-07 DIAGNOSIS — IMO0002 Reserved for concepts with insufficient information to code with codable children: Secondary | ICD-10-CM

## 2021-01-07 LAB — POCT GLYCOSYLATED HEMOGLOBIN (HGB A1C): Hemoglobin A1C: 7.2 % — AB (ref 4.0–5.6)

## 2021-01-07 MED ORDER — TRESIBA FLEXTOUCH 200 UNIT/ML ~~LOC~~ SOPN: PEN_INJECTOR | SUBCUTANEOUS | 3 refills | Status: DC

## 2021-01-07 MED ORDER — HUMALOG KWIKPEN 200 UNIT/ML ~~LOC~~ SOPN: PEN_INJECTOR | SUBCUTANEOUS | 3 refills | Status: DC

## 2021-01-07 NOTE — Telephone Encounter (Signed)
Patient called to advise that Free Style Libre 2 - is covered by insurance - needs to go to either [facility] or [facility] for fill  Ok to start Tresiba & Humalog - they are covered by insurance - RX needs to be be sent as 90 day RX to [facility]  Also patient advises that medications he was started on in 05/2019 when had COVID were Novolog & Levemir

## 2021-01-07 NOTE — Patient Instructions (Addendum)
Please continue:  - Glipizide 5 mg 15-30 min before b'fast but stop the dinnertime dose - Trulicity 3 mg weekly  Stop: - 70/30 insulin   Start: - Tresiba 32 units daily - Humalog 8-12 units 15 min before the main meals  The most common suppliers for the continuous glucose monitor (FreeStyle [place] 2) are:  [facility] [phone], extension [phone]  -[facility] [employer] [phone]  [facility] [place] [phone]    Please return in 4 months with your sugar log.

## 2021-01-07 NOTE — Addendum Note (Signed)
Addended by: Elta Guadeloupe on: 01/07/2021 11:47 AM   Modules accepted: Orders

## 2021-01-08 ENCOUNTER — Other Ambulatory Visit: Payer: Self-pay | Admitting: Internal Medicine

## 2021-01-08 ENCOUNTER — Ambulatory Visit (INDEPENDENT_AMBULATORY_CARE_PROVIDER_SITE_OTHER): Payer: Medicare Other | Admitting: Physician Assistant

## 2021-01-08 ENCOUNTER — Ambulatory Visit (HOSPITAL_COMMUNITY)
Admission: RE | Admit: 2021-01-08 | Discharge: 2021-01-08 | Disposition: A | Discharge status: Home / Self Care | Payer: Medicare Other | Source: Ambulatory Visit | Attending: Vascular Surgery | Admitting: Vascular Surgery

## 2021-01-08 VITALS — BP 145/89 | HR 89 | Temp 97.7°F | Resp 20 | Ht 73.5 in | Wt 245.0 lb

## 2021-01-08 DIAGNOSIS — I714 Abdominal aortic aneurysm, without rupture, unspecified: Secondary | ICD-10-CM

## 2021-01-08 MED ORDER — TRESIBA FLEXTOUCH 200 UNIT/ML ~~LOC~~ SOPN: PEN_INJECTOR | SUBCUTANEOUS | 3 refills | Status: DC

## 2021-01-08 MED ORDER — FREESTYLE LIBRE 2 SENSOR MISC: 1.0000 | 3 refills | Status: DC

## 2021-01-08 MED ORDER — HUMALOG KWIKPEN 200 UNIT/ML ~~LOC~~ SOPN: PEN_INJECTOR | SUBCUTANEOUS | 3 refills | Status: DC

## 2021-01-08 MED ORDER — FREESTYLE LIBRE 2 READER DEVI: 1.0000 | Freq: Every day | 0 refills | Status: DC

## 2021-01-08 NOTE — Telephone Encounter (Signed)
OK.  I ordered the insulins at [facility] and the CGM sensors + receiver at [place].

## 2021-01-08 NOTE — Progress Notes (Signed)
Office Note     CC:  follow up Requesting Provider:  Cyndi Bender, PA-C  HPI: [name] is a 75 y.o. (05-16-1945) male who presents for routine follow-up of AAA.  He is without complaints and has had no episodes of back or abdominal pain.  No claudication. Significant medical history includes DM, hypertension and CAD.  His wife accompanies him today.  3.6 cm infrarenal AAA found incidentally on CT and was evaluated by Dr. Donnetta Hutching in 2013.  He has been followed annually.  At last exam on 01/09/2020, the largest diameter was 4.2 cm.   He quit smoking in 2001. He takes rivaroxaban for atrial fibrillation. On statin therapy. A1c yesterday was 7.2 Past Medical History:  Diagnosis Date   AAA (abdominal aortic aneurysm) ([facility])    Allergy    Atrial fibrillation [facility]) October 2013   Xarelto started; duration unknown   Cataract    Chronic kidney disease    Stage III    Coronary artery disease    post bare-metal stent to LAD in 2001   Diabetes mellitus    type 2    Hypertension    Myocardial infarct [facility]) 2001   Obesity     Past Surgical History:  Procedure Laterality Date   CARDIAC CATHETERIZATION  2006   stable with primarily nonobstructive disease   CARDIOVERSION  03/27/2012   Procedure: CARDIOVERSION;  Surgeon: Carlena Bjornstad, MD;  Location: [facility] ENDOSCOPY;  Service: Cardiovascular;  Laterality: N/A;   COLOSTOMY TAKEDOWN N/A 03/08/2018   Procedure: LAPAROSCOPIC HARTMANNS REVERSAL;  Surgeon: Ileana Roup, MD;  [facility];  Service: General;  Laterality: N/A;   CORONARY ANGIOPLASTY WITH STENT PLACEMENT  2001   bare-metal stent to the LAD   CYSTOSCOPY W/ URETERAL STENT PLACEMENT Bilateral 03/08/2018   Procedure: CYSTOSCOPY WITH RETROGRADE AND BILATERAL URETERAL STENT PLACEMENT;  Surgeon: Lucas Mallow, MD;  [facility];  Service: Urology;  Laterality: Bilateral;   CYSTOSCOPY WITH STENT PLACEMENT Bilateral 06/03/2017   Procedure: CYSTOSCOPY WITH STENT  PLACEMENT;  Surgeon: Festus Aloe, MD;  [facility];  Service: Urology;  Laterality: Bilateral;   FLEXIBLE SIGMOIDOSCOPY N/A 03/08/2018   Procedure: FLEXIBLE SIGMOIDOSCOPY;  Surgeon: Ileana Roup, MD;  [facility];  Service: General;  Laterality: N/A;   HERNIA REPAIR  2006   LAPAROSCOPIC PARASTOMAL HERNIA N/A 03/08/2018   Procedure: PARASTOMAL HERNIA REPAIR;  Surgeon: Ileana Roup, MD;  [facility];  Service: General;  Laterality: N/A;   LAPAROSCOPIC SIGMOID COLECTOMY N/A 06/03/2017   Procedure: LAPAROSCOPIC  SIGMOID COLECTOMY, COLOSTOMY;  Surgeon: Ileana Roup, MD;  [facility];  Service: General;  Laterality: N/A;   SKIN GRAFT  1981    Social History   Socioeconomic History   Marital status: Married    Spouse name: Not on file   Number of children: Not on file   Years of education: Not on file   Highest education level: Not on file  Occupational History   Not on file  Tobacco Use   Smoking status: Former    Packs/day: 0.50    Years: 40.00    Pack years: 20.00    Types: Cigarettes, Pipe, Cigars    Quit date: 05/11/1999    Years since quitting: 21.6   Smokeless tobacco: Never  Vaping Use   Vaping Use: Never used  Substance and Sexual Activity   Alcohol use: No   Drug use: No   Sexual activity: Yes  Other Topics  Concern   Not on file  Social History Narrative   Married    Retired: [employer]   3 grown children   Social Determinants of Radio broadcast assistant Strain: Not on file  Food Insecurity: Not on file  Transportation Needs: Not on file  Physical Activity: Not on file  Stress: Not on file  Social Connections: Not on file  Intimate Partner Violence: Not on file   Family History  Problem Relation Age of Onset   Heart disease Father    Heart attack Father    ALS Mother    Diabetes Sister    Hypertension Sister    Heart failure Brother    Stroke Neg Hx     Current Outpatient Medications  Medication Sig  Dispense Refill   acetaminophen (TYLENOL) 500 MG tablet Take 1,000 mg by mouth every 6 (six) hours as needed for mild pain, fever or headache.     ascorbic acid (VITAMIN C) 500 MG tablet Take 1 tablet (500 mg total) by mouth daily. 30 tablet 0   atorvastatin (LIPITOR) 20 MG tablet Take 20 mg by mouth at bedtime.      glipiZIDE (GLUCOTROL) 5 MG tablet TAKE 1 TABLET (5 MG TOTAL) BY MOUTH 2 (TWO) TIMES DAILY BEFORE A MEAL. 180 tablet 3   HUMULIN 70/30 KWIKPEN (70-30) 100 UNIT/ML KwikPen INJECT SUBCUTANEOUSLY 25  UNITS BEFORE BREAKFAST AND  25 UNITS BEFORE DINNER 45 mL 3   insulin degludec (TRESIBA FLEXTOUCH) 200 UNIT/ML FlexTouch Pen Inject 32 units under skin daily 12 mL 3   insulin lispro (HUMALOG KWIKPEN) 200 UNIT/ML KwikPen Inject 8-12 units under skin 3x a day before meals 30 mL 3   insulin NPH-regular Human (70-30) 100 UNIT/ML injection Inject 32-36 Units into the skin in the morning and at bedtime.     metoprolol tartrate (LOPRESSOR) 25 MG tablet TAKE ONE-HALF TABLET BY  MOUTH TWICE DAILY 90 tablet 3   nitroGLYCERIN (NITROSTAT) 0.4 MG SL tablet Place 0.4 mg under the tongue every 5 (five) minutes as needed for chest pain.      omeprazole (PRILOSEC) 20 MG capsule Take 20 mg by mouth daily as needed (for acid reflux/indigestion).      RELION PEN NEEDLES 32G X 4 MM MISC USE 1 SYRINGE  TWICE DAILY AS NEEDED 123XX123 each 11   TRULICITY 3 0000000 SOPN INJECT THE CONTENTS OF 1  PEN ('3MG'$ ) SUBCUTANEOUSLY  WEEKLY AS DIRECTED 6 mL 3   XARELTO 20 MG TABS tablet TAKE 1 TABLET BY MOUTH EVERY DAY 90 tablet 1   No current facility-administered medications for this visit.    No Known Allergies   REVIEW OF SYSTEMS:   '[X]'$  denotes positive finding, '[ ]'$  denotes negative finding Cardiac  Comments:  Chest pain or chest pressure:    Shortness of breath upon exertion:    Short of breath when lying flat:    Irregular heart rhythm:        Vascular    Pain in calf, thigh, or hip brought on by ambulation:     Pain in feet at night that wakes you up from your sleep:     Blood clot in your veins:    Leg swelling:         Pulmonary    Oxygen at home:    Productive cough:     Wheezing:         Neurologic    Sudden weakness in arms or legs:     Sudden  numbness in arms or legs:     Sudden onset of difficulty speaking or slurred speech:    Temporary loss of vision in one eye:     Problems with dizziness:         Gastrointestinal    Blood in stool:     Vomited blood:         Genitourinary    Burning when urinating:     Blood in urine:        Psychiatric    Major depression:         Hematologic    Bleeding problems:    Problems with blood clotting too easily:        Skin    Rashes or ulcers:        Constitutional    Fever or chills:      PHYSICAL EXAMINATION:  Vitals:   01/08/21 0928  BP: (!) 145/89  Pulse: 89  Resp: 20  Temp: 97.7 F (36.5 C)  SpO2: 97%     General:  WDWN in NAD; vital signs documented above Gait: unaided; mild ataxia HENT: WNL, normocephalic Pulmonary: normal non-labored breathing , without Rales, rhonchi,  wheezing Cardiac: regular HR, without  Murmurs without carotid bruit Abdomen: soft, NT, no masses Skin: without rashes Vascular Exam/Pulses: 2+ bilateral DP and radial pulses Extremities: without ischemic changes, without Gangrene , without cellulitis; without open wounds;  Musculoskeletal: no muscle wasting or atrophy  Neurologic: A&O X 3;  No focal weakness or paresthesias are detected Psychiatric:  The pt has Normal affect.   Non-Invasive Vascular Imaging:   01/08/2021 Summary:  Abdominal Aorta: There is evidence of abnormal dilatation of the mid  Abdominal aorta. The largest aortic measurement is 4.2 cm. The largest  aortic diameter remains essentially unchanged compared to prior exam.  Previous diameter measurement was 4.2 cm  obtained on 01/09/2020.     *See table(s) above for measurements and observations.      Preliminary         ASSESSMENT/PLAN:: 75 y.o. male here for follow up for infrarenal abdominal aortic aneurysm.  Maximal aorta measurement today is 4.2 cm.  He is asymptomatic.  Right common iliac artery andl eft common iliac artery measure 1.7 cm   Stable size of infrarenal AAA.  We discussed continued control of blood pressure and blood glucose levels.  Seek immediate emergent medical attention for onset of acute abdominal or back pain.  Follow-up in 1 year with ultrasound.  Barbie Banner, PA-C [facility] [phone]  Clinic MD:   Scot Dock

## 2021-01-26 ENCOUNTER — Telehealth: Payer: Self-pay

## 2021-01-26 NOTE — Telephone Encounter (Signed)
[facility] Requesting Chart Notes. Fax number confirmed: [phone]

## 2021-01-27 NOTE — Telephone Encounter (Signed)
[name] [name] called re: [facility] told Patient that instead of receiving recent chart notes (see previous message), [facility] received another RX on 01/26/21.

## 2021-01-27 NOTE — Telephone Encounter (Signed)
Clinical notes routed via Epic to [place] at [phone]

## 2021-02-12 ENCOUNTER — Telehealth: Payer: Self-pay

## 2021-02-12 MED ORDER — HUMALOG KWIKPEN 200 UNIT/ML ~~LOC~~ SOPN: PEN_INJECTOR | SUBCUTANEOUS | 3 refills | Status: DC

## 2021-02-12 NOTE — Telephone Encounter (Signed)
Medical clarification request from [place] in regards to Humalog Kwikpen 200U/ML and Humulin Kwikpen 70/30 has been sent. As of 01/07/2021 visit, patient was advised by provider to stop Humulin and Start Humalog

## 2021-02-12 NOTE — Telephone Encounter (Signed)
Incoming Fax From [employer] requesting chart notes from most recent diabetic visit outlining beneficiary's blood glucose and insulin regimen has now been faxed. Fax Number: [phone]

## 2021-02-16 ENCOUNTER — Other Ambulatory Visit: Payer: Self-pay | Admitting: Cardiovascular Disease

## 2021-02-16 NOTE — Telephone Encounter (Signed)
Prescription refill request for Xarelto received.  Indication:Afib  Last office visit: 01/21/20 - Pt has scheduled appt with Dr McAlhany12/16/22 Weight: 111.1kg Age: 75 Scr: 1.50 (12/02/20) CrCl: 66.59ml.min  Appropriate dose and refill sent to requested pharmacy.

## 2021-02-19 ENCOUNTER — Other Ambulatory Visit: Payer: Self-pay | Admitting: Internal Medicine

## 2021-02-25 ENCOUNTER — Other Ambulatory Visit: Payer: Self-pay | Admitting: Internal Medicine

## 2021-03-23 ENCOUNTER — Ambulatory Visit: Payer: Medicare Other | Admitting: Cardiovascular Disease

## 2021-03-23 ENCOUNTER — Encounter: Payer: Self-pay | Admitting: Cardiovascular Disease

## 2021-03-23 ENCOUNTER — Other Ambulatory Visit: Payer: Self-pay

## 2021-03-23 VITALS — BP 110/88 | HR 74 | Ht 73.5 in | Wt 234.0 lb

## 2021-03-23 DIAGNOSIS — I10 Essential (primary) hypertension: Secondary | ICD-10-CM

## 2021-03-23 DIAGNOSIS — I4821 Permanent atrial fibrillation: Secondary | ICD-10-CM | POA: Diagnosis not present

## 2021-03-23 DIAGNOSIS — I2510 Atherosclerotic heart disease of native coronary artery without angina pectoris: Secondary | ICD-10-CM | POA: Diagnosis not present

## 2021-03-23 NOTE — Patient Instructions (Signed)
Medication Instructions:  Your physician recommends that you continue on your current medications as directed. Please refer to the Current Medication list given to you today.  *If you need a refill on your cardiac medications before your next appointment, please call your pharmacy*   Lab Work: None ordered   If you have labs (blood work) drawn today and your tests are completely normal, you will receive your results only by: [place] (if you have MyChart) OR A paper copy in the mail If you have any lab test that is abnormal or we need to change your treatment, we will call you to review the results.   Testing/Procedures: None ordered    Follow-Up: At [facility], you and your health needs are our priority.  As part of our continuing mission to provide you with exceptional heart care, we have created designated Provider Care Teams.  These Care Teams include your primary Cardiologist (physician) and Advanced Practice Providers (APPs -  Physician Assistants and Nurse Practitioners) who all work together to provide you with the care you need, when you need it.  We recommend signing up for the patient portal called "MyChart".  Sign up information is provided on this After Visit Summary.  MyChart is used to connect with patients for Virtual Visits (Telemedicine).  Patients are able to view lab/test results, encounter notes, upcoming appointments, etc.  Non-urgent messages can be sent to your provider as well.   To learn more about what you can do with MyChart, go to [email or web address].    Your next appointment:   12 month(s)  The format for your next appointment:   In Person  Provider:   Lauree Chandler, MD {    Other Instructions None

## 2021-03-23 NOTE — Progress Notes (Signed)
Chief Complaint  Patient presents with   Follow-up    CAD    History of Present Illness: 75 yo male with history of CAD, HTN, hyperlipidemia, persistent atrial fibrillation, AAA and DM who is here today for cardiac follow up. In 2001 he had an anterior STEMI treated with a bare-metal stent in the LAD. He underwent catheterization in 2006 and the stent was patent and there was an 80% narrowing in the diagonal branch which was treated medically. He was noted to be in atrial fibrillation in October 2013 and was started on Xarelto and DCCV was arranged on 03/27/12 with return to sinus rhythm. He has since converted back to atrial fibrillation and has been controlled on metoprolol.  AAA followed in [facility]. Last u/s September 2022 stable AAA at 4.2 cm. Echo July 2018 with normal LV size and function with no significant valve disease. 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 He had colostomy reversal in October 2019. He had Covid pneumonia in February 2021. Atrial fib with RVR while hospitalized.   He is here today for follow up. The patient denies any chest pain, dyspnea, palpitations, lower extremity edema, orthopnea, PND, dizziness, near syncope or syncope. He spends most days      Primary Care Physician: Cyndi Bender, PA-C  Past Medical History:  Diagnosis Date   AAA (abdominal aortic aneurysm)    Allergy    Atrial fibrillation [facility]) October 2013   Xarelto started; duration unknown   Cataract    Chronic kidney disease    Stage III    Coronary artery disease    post bare-metal stent to LAD in 2001   Diabetes mellitus    type 2    Hypertension    Myocardial infarct [facility]) 2001   Obesity     Past Surgical History:  Procedure Laterality Date   CARDIAC CATHETERIZATION  2006   stable with primarily nonobstructive disease   CARDIOVERSION  03/27/2012   Procedure: CARDIOVERSION;  Surgeon: Carlena Bjornstad, MD;  Location: [facility] ENDOSCOPY;  Service:  Cardiovascular;  Laterality: N/A;   COLOSTOMY TAKEDOWN N/A 03/08/2018   Procedure: LAPAROSCOPIC HARTMANNS REVERSAL;  Surgeon: Ileana Roup, MD;  Location: [facility] ORS;  Service: General;  Laterality: N/A;   CORONARY ANGIOPLASTY WITH STENT PLACEMENT  2001   bare-metal stent to the LAD   CYSTOSCOPY W/ URETERAL STENT PLACEMENT Bilateral 03/08/2018   Procedure: CYSTOSCOPY WITH RETROGRADE AND BILATERAL URETERAL STENT PLACEMENT;  Surgeon: Lucas Mallow, MD;  Location: [facility] ORS;  Service: Urology;  Laterality: Bilateral;   CYSTOSCOPY WITH STENT PLACEMENT Bilateral 06/03/2017   Procedure: CYSTOSCOPY WITH STENT PLACEMENT;  Surgeon: Festus Aloe, MD;  Location: [facility]S;  Service: Urology;  Laterality: Bilateral;   FLEXIBLE SIGMOIDOSCOPY N/A 03/08/2018   Procedure: FLEXIBLE SIGMOIDOSCOPY;  Surgeon: Ileana Roup, MD;  Location: [facility]S;  Service: General;  Laterality: N/A;   HERNIA REPAIR  2006   LAPAROSCOPIC PARASTOMAL HERNIA N/A 03/08/2018   Procedure: PARASTOMAL HERNIA REPAIR;  Surgeon: Ileana Roup, MD;  Location: [facility];  Service: General;  Laterality: N/A;   LAPAROSCOPIC SIGMOID COLECTOMY N/A 06/03/2017   Procedure: LAPAROSCOPIC  SIGMOID COLECTOMY, COLOSTOMY;  Surgeon: Ileana Roup, MD;  Location: [facility];  Service: General;  Laterality: N/A;   SKIN GRAFT  1981    Current Outpatient Medications  Medication Sig Dispense Refill   acetaminophen (TYLENOL) 500 MG tablet Take 1,000 mg by mouth every 6 (six) hours as  needed for mild pain, fever or headache.     atorvastatin (LIPITOR) 20 MG tablet Take 20 mg by mouth at bedtime.      Continuous Blood Gluc Receiver (FREESTYLE LIBRE 2 READER) DEVI 1 each by Does not apply route daily. 1 each 0   Continuous Blood Gluc Sensor (FREESTYLE LIBRE 2 SENSOR) MISC 1 each by Does not apply route every 14 (fourteen) days. 6 each 3   glipiZIDE (GLUCOTROL) 5 MG tablet TAKE 1 TABLET (5 MG TOTAL) BY MOUTH 2 (TWO) TIMES DAILY BEFORE A MEAL.  180 tablet 3   insulin degludec (TRESIBA FLEXTOUCH) 200 UNIT/ML FlexTouch Pen Inject 32 units under skin daily 12 mL 3   insulin lispro (HUMALOG KWIKPEN) 200 UNIT/ML KwikPen Inject 8-12 units under skin 3x a day before meals 30 mL 3   metoprolol tartrate (LOPRESSOR) 25 MG tablet TAKE ONE-HALF TABLET BY  MOUTH TWICE DAILY 90 tablet 3   nitroGLYCERIN (NITROSTAT) 0.4 MG SL tablet Place 0.4 mg under the tongue every 5 (five) minutes as needed for chest pain.      omeprazole (PRILOSEC) 20 MG capsule Take 20 mg by mouth daily as needed (for acid reflux/indigestion).      RELION PEN NEEDLES 32G X 4 MM MISC USE  TWICE DAILY WITH  INSULIN 235 each 0   TRULICITY 3 TI/1.4ER SOPN INJECT THE CONTENTS OF ONE  PEN SUBCUTANEOUSLY WEEKLY  AS DIRECTED 6 mL 3   XARELTO 20 MG TABS tablet TAKE 1 TABLET BY MOUTH EVERY DAY 90 tablet 1   ascorbic acid (VITAMIN C) 500 MG tablet Take 1 tablet (500 mg total) by mouth daily. (Patient not taking: Reported on 03/23/2021) 30 tablet 0   No current facility-administered medications for this visit.    No Known Allergies  Social History   Socioeconomic History   Marital status: Married    Spouse name: Not on file   Number of children: Not on file   Years of education: Not on file   Highest education level: Not on file  Occupational History   Not on file  Tobacco Use   Smoking status: Former    Packs/day: 0.50    Years: 40.00    Pack years: 20.00    Types: Cigarettes, Pipe, Cigars    Quit date: 05/11/1999    Years since quitting: 21.8   Smokeless tobacco: Never  Vaping Use   Vaping Use: Never used  Substance and Sexual Activity   Alcohol use: No   Drug use: No   Sexual activity: Yes  Other Topics Concern   Not on file  Social History Narrative   Married    Retired: [employer]   3 grown children   Social Determinants of Radio broadcast assistant Strain: Not on file  Food Insecurity: Not on file  Transportation Needs: Not on file  Physical Activity: Not on  file  Stress: Not on file  Social Connections: Not on file  Intimate Partner Violence: Not on file    Family History  Problem Relation Age of Onset   Heart disease Father    Heart attack Father    ALS Mother    Diabetes Sister    Hypertension Sister    Heart failure Brother    Stroke Neg Hx     Review of Systems:  As stated in the HPI and otherwise negative.   BP 110/88   Pulse 74   Ht 6' 1.5" (1.867 m)   Wt 234 lb (106.1 kg)  SpO2 98%   BMI 30.45 kg/m   Physical Examination:  General: Well developed, well nourished, NAD  HEENT: OP clear, mucus membranes moist  SKIN: warm, dry. No rashes. Neuro: No focal deficits  Musculoskeletal: Muscle strength 5/5 all ext  Psychiatric: Mood and affect normal  Neck: No JVD, no carotid bruits, no thyromegaly, no lymphadenopathy.  Lungs:opening crackles bases. no wheezes, rhonci, crackles Cardiovascular: Regular rate and rhythm. No murmurs, gallops or rubs. Abdomen:Soft. Bowel sounds present. Non-tender.  Extremities: No lower extremity edema. Pulses are 2 + in the bilateral DP/PT.  Echo 11/17/16: .- Left ventricle: The cavity size was normal. There was moderate   concentric hypertrophy. Systolic function was normal. The   estimated ejection fraction was in the range of 50% to 55%. Mild   hypokinesis of the inferoseptal and apical inferior myocardium. - Aortic valve: Transvalvular velocity was within the normal range.   There was no stenosis. There was no regurgitation. - Mitral valve: Transvalvular velocity was within the normal range.   There was no evidence for stenosis. There was no regurgitation. - Left atrium: The atrium was severely dilated. - Right ventricle: The cavity size was normal. Wall thickness was   normal. Systolic function was normal. - Right atrium: The atrium was severely dilated. - Atrial septum: No defect or patent foramen ovale was identified   by color flow Doppler. - Tricuspid valve: There was mild  regurgitation. - Pulmonary arteries: Systolic pressure was within the normal   range. PA peak pressure: 35 mm Hg (S).  EKG:  EKG is ordered today. The ekg ordered today demonstrates Atrial fibrillation, rate 74 bpm  Recent Labs: No results found for requested labs within last 8760 hours.   Lipid Panel Followed in primary care   Wt Readings from Last 3 Encounters:  03/23/21 234 lb (106.1 kg)  01/08/21 245 lb (111.1 kg)  01/07/21 246 lb (111.6 kg)     Other studies Reviewed: Additional studies/ records that were reviewed today include:  Review of the above records demonstrates:  Assessment and Plan:   1. CAD without angina: No chest pain. Continue statin and beta blocker. He has not been on ASA since he is on Xarelto. Lipids followed in primary care. Last LDL January 2022 near goal.   2. Atrial fibrillation, persistent: He is in atrial fib today, rate controlled. Continue beta blocker and Xarelto.       3. HYPERTENSION: BP is well controlled. No changes  Current medicines are reviewed at length with the patient today.  The patient does not have concerns regarding medicines.  The following changes have been made:  no change  Labs/ tests ordered today include:  No orders of the defined types were placed in this encounter.   Disposition:   F/U with me in 12 months  Signed, Lauree Chandler, MD 03/23/2021 9:25 AM    [facility] [address], [place], [place]  [address] Phone: [phone]; Fax: [phone]

## 2021-03-24 ENCOUNTER — Telehealth: Payer: Self-pay | Admitting: Internal Medicine

## 2021-03-24 MED ORDER — RELION PEN NEEDLES 32G X 4 MM MISC: 1 refills | Status: DC

## 2021-03-24 NOTE — Telephone Encounter (Signed)
Rx sent to preferred pharmacy.

## 2021-03-24 NOTE — Telephone Encounter (Signed)
Received a call from patients's wife requesting her husband gets four boxes of needles because his medication was increased. Send to [facility] in [place].

## 2021-04-24 ENCOUNTER — Ambulatory Visit: Payer: Medicare Other | Admitting: Cardiovascular Disease

## 2021-05-18 NOTE — Progress Notes (Signed)
Patient ID: [name], male   DOB: 11/10/1945, 76 y.o.   MRN: [id number]  This visit occurred during the SARS-CoV-2 public health emergency.  Safety protocols were in place, including screening questions prior to the visit, additional usage of staff PPE, and extensive cleaning of exam room while observing appropriate contact time as indicated for disinfecting solutions.   HPI: [name] is a 76 y.o.-year-old male, returning for f/u for DM2, dx 1995, insulin-dependent, uncontrolled, with complications (CKD stage 3, CAD - AMI, s/p stent LAD 2001). Last visit 4 months ago.  He is here with his wife, who is also my patient, and offers part of the history especially related to his blood sugars, insulin doses, and past medical history.  Interim history: No increased urination, blurry vision, nausea, chest pain.  He did gain weight since last visit over the holidays. He was diagnosed with pancreatic insufficiency by Dr. Connelly [place] Gong.  He continues to have occasional diarrhea.  Creon is very expensive, but they did go ahead and buy it and this is helping.  Since last visit, he was able to obtain a freestyle libre 2 CGM. He recently started to go to the gym with his wife.  Reviewed HbA1c levels: Lab Results  Component Value Date   HGBA1C 7.2 (A) 01/07/2021   HGBA1C 6.6 (A) 09/05/2020   HGBA1C 6.6 (A) 05/07/2020   He is on: - Trulicity 1.5  mg weekly >> 3 mg weekly  - Glipizide 5 mg 15-30 min before b'fast and dinner - Tresiba 32 units daily - Humalog 8-12 units 15 min before the main meals He was on the insulin 70/30 before admission for COVID-19 + pneumonia in 05/2019.  We stopped this in 12/2020. We tried low-dose metformin ER several times, but he could not tolerate it due to diarrhea >> now diarrhea is better He was on Tanzeum 50 mg weekly (started 09/2015) - >200$ per month! >> Trulicity 1.5 mg weekly He was previously on glipizide-metformin but could not tolerate it due to diarrhea  -stopped 08/2017  Pt checks his sugars >4x a day with his freestyle libre 2 CGM:   Prev.: - am:   104-172, 182, 215 >> 104-191, 241 >> 146-186 - 2h after b'fast: n/c >> 223 >> n/c >> 219 >> n/c >> 195 >> n/c - before lunch: 198, 215 >> n/c - 2h after lunch: n/c >> 162-202 >> 185-226 >> 166-226 >> 134-292 - before dinner: 67, 79-175, 187 >> 65, 121-209 >> 150-198, 244 - 2h after dinner: 196 >> n/c >> 179, 180 >> n/c - bedtime: 132, 150 >> n/c >> 179, 180 >> 181 >> n/c - nighttime:58 x2, 70, 71, 98, 182 >> 47, 56, 57, 100-173 Lowest sugar was 59 at bedtime ...  >> 63 >> 58 (diarrhea) >> 47; he does not have hypoglycemia awareness! Highest sugar was 241 >> 260 >> 226 >> 186  Pt's meals are: - Breakfast: eggs, toast, ham/sausage or cereals + milk - Lunch: sandwich, vegetables, meat - Dinner: seafood, meat, vegetables - Snacks: nabs  Reviewed previous labs: 05/22/2020: Lipids: 140/50/53/75 Glucose 131, BUN/creatinine 16/1.68, GFR 42  11/01/2019: Lipids: 128/52/49/67 Glucose 143, BUN/creatinine 16/1.48, GFR 46, CMP otherwise normal  04/30/2019: Lipids: 146/55/49/85 CMP normal, except BUN/creatinine 18/1.6, GFR 42.  His glucose was 84. PSA 0.6  04/26/2018: CMP normal with the exception of a slightly high glucose of 124, BUN/creatinine 19/1.5, GFR 46 and slightly higher chloride, of 107. Lipids: 147/58/47/88 CBC with slightly low red blood cells  of 4.08 (4.14-5.8), normal hemoglobin of 13.3 and slightly high MCV of 99 (79-97) PSA 0.8  04/13/2017: - Lipids: 124/65/39/72 - CMP normal, glucose 98, BUN/creatinine 10/1.3, GFR 55, potassium 3.4 (3.5-5.2), Ca 8.1 (8.6-10.2) - CBC with differential: low Hb 11.3 (13-17.7), low RBC 3.57 (4.14-5.8) - ferritin 167 - PSA 0.5  09/30/2015: - Hep C virus antibody <0.1 - Lipids: 126/68/44/68 - CMP normal, except glucose 209, BUN/creatinine 22/1.39, GFR 51, potassium 5.4 (3.5-5.2), CO2 17 (18-29) - CBC with differential normal - B12  vitamin 305 (28-786)  04/01/2015: - ACR 56.3 - CBC with differential normal - Lipids: 116/56/42/63 - CMP: Glucose 193, BUN/creatinine 21/1.23, GFR 60, LFTs normal - PSA 0.5    -He has CKD stage III.  -+ HL.  On Lipitor 20.  He is on Neurontin.  - last eye exam was on 06/05/2020: No DR  - nonumbness and tingling in his feet.   He has a history of diverticulitis (saw Dr. Mallarie Voorhies Gong).  He had a colectomy in 05/2017 and had to have a colostomy bag.  He was feeling better after his colectomy.  He had hernia repair surgery and reversal of his ostomy 03/08/2018.  He had [facility]-94 complicated with pneumonia in 05/2019.  At that time, he was admitted with A. fib with RVR and was treated with remdesivir, convalescent plasma, dexamethasone.   ROS: + see HPI  I reviewed pt's medications, allergies, PMH, social hx, family hx, and changes were documented in the history of present illness. Otherwise, unchanged from my initial visit note.  Past Medical History:  Diagnosis Date   AAA (abdominal aortic aneurysm)    Allergy    Atrial fibrillation [facility]) October 2013   Xarelto started; duration unknown   Cataract    Chronic kidney disease    Stage III    Coronary artery disease    post bare-metal stent to LAD in 2001   Diabetes mellitus    type 2    Hypertension    Myocardial infarct [facility]) 2001   Obesity    Past Surgical History:  Procedure Laterality Date   CARDIAC CATHETERIZATION  2006   stable with primarily nonobstructive disease   CARDIOVERSION  03/27/2012   Procedure: CARDIOVERSION;  Surgeon: Carlena Bjornstad, MD;  Location: [facility] ENDOSCOPY;  Service: Cardiovascular;  Laterality: N/A;   COLOSTOMY TAKEDOWN N/A 03/08/2018   Procedure: LAPAROSCOPIC HARTMANNS REVERSAL;  Surgeon: Ileana Roup, MD;  Location: [facility];  Service: General;  Laterality: N/A;   CORONARY ANGIOPLASTY WITH STENT PLACEMENT  2001   bare-metal stent to the LAD   CYSTOSCOPY W/ URETERAL STENT PLACEMENT Bilateral  03/08/2018   Procedure: CYSTOSCOPY WITH RETROGRADE AND BILATERAL URETERAL STENT PLACEMENT;  Surgeon: Lucas Mallow, MD;  Location: [facility];  Service: Urology;  Laterality: Bilateral;   CYSTOSCOPY WITH STENT PLACEMENT Bilateral 06/03/2017   Procedure: CYSTOSCOPY WITH STENT PLACEMENT;  Surgeon: Festus Aloe, MD;  Location: [facility];  Service: Urology;  Laterality: Bilateral;   FLEXIBLE SIGMOIDOSCOPY N/A 03/08/2018   Procedure: FLEXIBLE SIGMOIDOSCOPY;  Surgeon: Ileana Roup, MD;  [facility]: [facility];  Service: General;  Laterality: N/A;   HERNIA REPAIR  2006   LAPAROSCOPIC PARASTOMAL HERNIA N/A 03/08/2018   Procedure: PARASTOMAL HERNIA REPAIR;  Surgeon: Ileana Roup, MD;  Location: [facility];  Service: General;  Laterality: N/A;   LAPAROSCOPIC SIGMOID COLECTOMY N/A 06/03/2017   Procedure: LAPAROSCOPIC  SIGMOID COLECTOMY, COLOSTOMY;  Surgeon: Ileana Roup, MD;  Location: [facility];  Service: General;  Laterality:  N/A;   SKIN GRAFT  1981   Social History Main Topics   Smoking status: Former Smoker -- 0.50 packs/day for 40 years    Types: Cigarettes, Pipe, Cigars    Quit date: 05/11/1999   Smokeless tobacco: Never Used   Alcohol Use: No   Drug Use: No   Social History Narrative   Married    Retired: [employer]   3 grown children   Current Outpatient Medications on File Prior to Visit  Medication Sig Dispense Refill   acetaminophen (TYLENOL) 500 MG tablet Take 1,000 mg by mouth every 6 (six) hours as needed for mild pain, fever or headache.     ascorbic acid (VITAMIN C) 500 MG tablet Take 1 tablet (500 mg total) by mouth daily. (Patient not taking: Reported on 03/23/2021) 30 tablet 0   atorvastatin (LIPITOR) 20 MG tablet Take 20 mg by mouth at bedtime.      Continuous Blood Gluc Receiver (FREESTYLE LIBRE 2 READER) DEVI 1 each by Does not apply route daily. 1 each 0   Continuous Blood Gluc Sensor (FREESTYLE LIBRE 2 SENSOR) MISC 1 each by Does not apply route every 14 (fourteen)  days. 6 each 3   glipiZIDE (GLUCOTROL) 5 MG tablet TAKE 1 TABLET (5 MG TOTAL) BY MOUTH 2 (TWO) TIMES DAILY BEFORE A MEAL. 180 tablet 3   insulin degludec (TRESIBA FLEXTOUCH) 200 UNIT/ML FlexTouch Pen Inject 32 units under skin daily 12 mL 3   insulin lispro (HUMALOG KWIKPEN) 200 UNIT/ML KwikPen Inject 8-12 units under skin 3x a day before meals 30 mL 3   Insulin Pen Needle (RELION PEN NEEDLES) 32G X 4 MM MISC USE 4X DAILY WITH  INSULIN 400 each 1   metoprolol tartrate (LOPRESSOR) 25 MG tablet TAKE ONE-HALF TABLET BY  MOUTH TWICE DAILY 90 tablet 3   nitroGLYCERIN (NITROSTAT) 0.4 MG SL tablet Place 0.4 mg under the tongue every 5 (five) minutes as needed for chest pain.      omeprazole (PRILOSEC) 20 MG capsule Take 20 mg by mouth daily as needed (for acid reflux/indigestion).      TRULICITY 3 JA/2.5KN SOPN INJECT THE CONTENTS OF ONE  PEN SUBCUTANEOUSLY WEEKLY  AS DIRECTED 6 mL 3   XARELTO 20 MG TABS tablet TAKE 1 TABLET BY MOUTH EVERY DAY 90 tablet 1   No current facility-administered medications on file prior to visit.   No Known Allergies Family History  Problem Relation Age of Onset   Heart disease Father    Heart attack Father    ALS Mother    Diabetes Sister    Hypertension Sister    Heart failure Brother    Stroke Neg Hx    PE: BP 128/78 (BP Location: Right Arm, Patient Position: Sitting, Cuff Size: Normal)    Pulse 78    Ht 6' 1.5" (1.867 m)    Wt 227 lb 3.2 oz (103.1 kg)    SpO2 96%    BMI 29.57 kg/m   Wt Readings from Last 3 Encounters:  05/19/21 250 lb 6.4 oz (113.6 kg)  03/23/21 234 lb (106.1 kg)  01/08/21 245 lb (111.1 kg)   Constitutional: overweight, in NAD Eyes: PERRLA, EOMI, no exophthalmos ENT: moist mucous membranes, no thyromegaly, no cervical lymphadenopathy Cardiovascular: Irregularly irregular rhythm, RR, No MRG Respiratory: CTA B Musculoskeletal: no deformities, strength intact in all 4 Skin: moist, warm, no rashes Neurological: no tremor with outstretched  hands, DTR normal in all 4  ASSESSMENT: 1. DM2, insulin-dependent, uncontrolled, with complications -  CAD - CKD stage 3  2. HL  3.  Overweight  PLAN:  1. Patient with longstanding, previously uncontrolled type 2 diabetes, with initially improved control after adding a GLP-1 receptor agonist.  However, afterwards, sugars started to worsen and at last visit HbA1c was higher, at 7.2%.  He was having blood sugars in the 140s to 180s range before meals and during the night he occasionally had lower blood sugars in the 40s and 50s.  At that time, I suggested to switch from the premixed insulin regimen to a basal-bolus insulin regimen, which was more flexible.  We continued Trulicity and glipizide.  I also suggested a freestyle libre CGM and I advised him to contact his insurance to see which suppliers were covered. CGM interpretation: -At today's visit, we reviewed his CGM downloads: It appears that 84% of values are in target range (goal >70%), while 15% are higher than 180 (goal <25%), and 1% are lower than 70 (goal <4%).  The calculated average blood sugar is 145.  The projected HbA1c for the next 3 months (GMI) is 6.8%. -Reviewing the CGM trends, It appears that his sugars are mostly well controlled throughout the day, but they are dropping too low  Overnight.  Upon questioning, he is using a fixed dose of Humalog, 12 units, before every meal.  We discussed about the need to vary the dose between 8 to 12 units, depending on the size of the meal, but for dinner, I advised him not to take more than 10 units to avoid  hypoglycemia overnight.  If blood sugars continue to decrease after this, we may need to decrease the dose of Tresiba. -I advised him to: Patient Instructions  Please continue:  - Glipizide 5 mg 15-30 min before b'fast  - Trulicity 3 mg weekly - Tresiba 32 units daily - Humalog 8-12 units 15 min before the main meals (decrease the dose to 8-10 units before dinner)  Please return in  4 months.   - we checked his HbA1c: 7.1% (slightly lower) - advised to check sugars at different times of the day - 4x a day, rotating check times - advised for yearly eye exams >> he is UTD - return to clinic in 4 months  2. HL -Reviewed latest lipid panel from 05/2020: 140/50/53/75-LDL close to our target of less than 70, the rest of fractions at goal -He continues Lipitor 20 mg daily without side effect  3.  Overweight -We will continue his Trulicity 3 mg weekly, which should also help with weight loss -He previously lost 18 pounds after starting Trulicity -At last visit, he gained 4 pounds.  We discussed about cutting out the late night snack -At this visit, however, he gained 16 pounds in the last 2 months.  He just started to go to the gym.  Philemon Kingdom, MD PhD [facility] Endocrinology

## 2021-05-19 ENCOUNTER — Other Ambulatory Visit: Payer: Self-pay

## 2021-05-19 ENCOUNTER — Encounter: Payer: Self-pay | Admitting: Internal Medicine

## 2021-05-19 ENCOUNTER — Ambulatory Visit: Payer: Medicare Other | Admitting: Internal Medicine

## 2021-05-19 VITALS — BP 128/78 | HR 78 | Ht 73.5 in | Wt 250.4 lb

## 2021-05-19 DIAGNOSIS — E7800 Pure hypercholesterolemia, unspecified: Secondary | ICD-10-CM | POA: Diagnosis not present

## 2021-05-19 DIAGNOSIS — E663 Overweight: Secondary | ICD-10-CM

## 2021-05-19 DIAGNOSIS — E1165 Type 2 diabetes mellitus with hyperglycemia: Secondary | ICD-10-CM | POA: Diagnosis not present

## 2021-05-19 DIAGNOSIS — E1159 Type 2 diabetes mellitus with other circulatory complications: Secondary | ICD-10-CM | POA: Diagnosis not present

## 2021-05-19 LAB — POCT GLYCOSYLATED HEMOGLOBIN (HGB A1C): Hemoglobin A1C: 7.1 % — AB (ref 4.0–5.6)

## 2021-05-19 MED ORDER — FREESTYLE PRECISION NEO TEST VI STRP: ORAL_STRIP | 3 refills | Status: AC

## 2021-05-19 NOTE — Patient Instructions (Addendum)
Please continue:  - Glipizide 5 mg 15-30 min before b'fast  - Trulicity 3 mg weekly - Tresiba 32 units daily - Humalog 8-12 units 15 min before the main meals (decrease the dose to 8-10 units before dinner)  Please return in 4 months.

## 2021-06-01 LAB — HM DIABETES EYE EXAM

## 2021-06-03 ENCOUNTER — Encounter: Payer: Self-pay | Admitting: Internal Medicine

## 2021-06-26 ENCOUNTER — Other Ambulatory Visit: Payer: Self-pay | Admitting: Internal Medicine

## 2021-06-30 ENCOUNTER — Telehealth: Payer: Self-pay

## 2021-06-30 DIAGNOSIS — E1159 Type 2 diabetes mellitus with other circulatory complications: Secondary | ICD-10-CM

## 2021-06-30 DIAGNOSIS — E1165 Type 2 diabetes mellitus with hyperglycemia: Secondary | ICD-10-CM

## 2021-06-30 MED ORDER — TRULICITY 4.5 MG/0.5ML ~~LOC~~ SOAJ: 4.5000 mg | SUBCUTANEOUS | 0 refills | Status: DC

## 2021-06-30 NOTE — Telephone Encounter (Signed)
Pt lvm advising refill for Trulicity needed. Pharmacy on file did not have 3.0 MG dose available. Pt advised new dose sent to pharmacy.

## 2021-07-16 ENCOUNTER — Telehealth: Payer: Self-pay

## 2021-07-16 DIAGNOSIS — E1159 Type 2 diabetes mellitus with other circulatory complications: Secondary | ICD-10-CM

## 2021-07-16 MED ORDER — TRULICITY 4.5 MG/0.5ML ~~LOC~~ SOAJ: 4.5000 mg | SUBCUTANEOUS | 3 refills | Status: DC

## 2021-07-16 NOTE — Telephone Encounter (Signed)
Pt called to advise pt tolerated increased dose and requested a refill. Rx sent to preferred pharmacy.  ?

## 2021-08-14 ENCOUNTER — Other Ambulatory Visit: Payer: Self-pay | Admitting: Cardiovascular Disease

## 2021-08-14 NOTE — Telephone Encounter (Signed)
Xarelto 20 mg refill request received. Pt is 76 years old, weight- 113.6 kg, Crea- 1.5 on 12/02/20, last seen by Dr. Julianne Handler on 03/23/21, Diagnosis- afib, CrCl- 68.37; Dose is appropriate based on dosing criteria. Will send in refill to requested pharmacy.    ?

## 2021-09-10 ENCOUNTER — Other Ambulatory Visit: Payer: Self-pay | Admitting: Cardiovascular Disease

## 2021-09-16 ENCOUNTER — Ambulatory Visit: Payer: Medicare Other | Admitting: Internal Medicine

## 2021-09-16 ENCOUNTER — Encounter: Payer: Self-pay | Admitting: Internal Medicine

## 2021-09-16 VITALS — BP 120/82 | HR 82 | Ht 73.5 in | Wt 247.4 lb

## 2021-09-16 DIAGNOSIS — E663 Overweight: Secondary | ICD-10-CM

## 2021-09-16 DIAGNOSIS — E1165 Type 2 diabetes mellitus with hyperglycemia: Secondary | ICD-10-CM | POA: Diagnosis not present

## 2021-09-16 DIAGNOSIS — E7800 Pure hypercholesterolemia, unspecified: Secondary | ICD-10-CM | POA: Diagnosis not present

## 2021-09-16 DIAGNOSIS — E1159 Type 2 diabetes mellitus with other circulatory complications: Secondary | ICD-10-CM

## 2021-09-16 LAB — POCT GLYCOSYLATED HEMOGLOBIN (HGB A1C): Hemoglobin A1C: 7 % — AB (ref 4.0–5.6)

## 2021-09-16 NOTE — Progress Notes (Signed)
Patient ID: [name], male   DOB: [birth year], 76 y.o.   MRN: [id number] ? ?This visit occurred during the SARS-CoV-2 public health emergency.  Safety protocols were in place, including screening questions prior to the visit, additional usage of staff PPE, and extensive cleaning of exam room while observing appropriate contact time as indicated for disinfecting solutions.  ? ?HPI: ?[name] is a 76 y.o.-year-old male, returning for f/u for DM2, dx 1995, insulin-dependent, uncontrolled, with complications (CKD stage 3, CAD - AMI, s/p stent LAD 2001). Last visit 4 months ago.  He is here with his wife, who is also my patient, and offers part of the history especially related to his blood sugars, insulin doses, and past medical history. ? ?Interim history: ?No increased urination, blurry vision, nausea, chest pain.  Before last visit, he gained weight during the holidays. ?He was diagnosed with pancreatic insufficiency by Dr. Samora Jernberg Gong.  He continues to have occasional diarrhea.  Creon is very expensive, but they were able to obtain it and this is helping ? ?Reviewed HbA1c levels: ?Lab Results  ?Component Value Date  ? HGBA1C 7.1 (A) 05/19/2021  ? HGBA1C 7.2 (A) 01/07/2021  ? HGBA1C 6.6 (A) 09/05/2020  ? ?He is on: ?- Trulicity 1.5  mg weekly >> 3 >> 4.5 >> 3 mg weekly  ?- Glipizide 5 mg 15-30 min before b'fast and dinner >> 5 mg before b'fast  ?- Tresiba 32 units daily ?- Humalog 8-12 units 15 min before the main meals, but up to 10 units before dinner >> 6-10 units before meals ?He was on the insulin 70/30 before admission for COVID-19 + pneumonia in 05/2019.  We stopped this in 12/2020. ?We tried low-dose metformin ER several times, but he could not tolerate it due to diarrhea >> now diarrhea is better ?He was on Tanzeum 50 mg weekly (started 09/2015) - >200$ per month! >> Trulicity 1.5 mg weekly ?He was previously on glipizide-metformin but could not tolerate it due to diarrhea -stopped 08/2017 ? ?Pt checks  his sugars >4x a day with his freestyle libre 2 CGM: ? ? ?Previously: ? ? ?Lowest sugar was 59 at bedtime ...  >> 63 >> 58 (diarrhea) >> 47 >> 60s; he does not have hypoglycemia awareness! ?Highest sugar was 241 >> 260 >> 226 >> 186 >> 259. ? ?Pt's meals are: ?- Breakfast: eggs, toast, ham/sausage or cereals + milk ?- Lunch: sandwich, vegetables, meat ?- Dinner: seafood, meat, vegetables ?- Snacks: nabs ? ?Reviewed previous labs: ?05/22/2020: ?Lipids: 140/50/53/75 ?Glucose 131, BUN/creatinine 16/1.68, GFR 42 ? ?11/01/2019: ?Lipids: 128/52/49/67 ?Glucose 143, BUN/creatinine 16/1.48, GFR 46, CMP otherwise normal ? ?04/30/2019: ?Lipids: 146/55/49/85 ?CMP normal, except BUN/creatinine 18/1.6, GFR 42.  His glucose was 84. ?PSA 0.6 ? ?04/26/2018: ?CMP normal with the exception of a slightly high glucose of 124, BUN/creatinine 19/1.5, GFR 46 and slightly higher chloride, of 107. ?Lipids: 147/58/47/88 ?CBC with slightly low red blood cells of 4.08 (4.14-5.8), normal hemoglobin of 13.3 and slightly high MCV of 99 (79-97) ?PSA 0.8 ? ?04/13/2017: ?- Lipids: 124/65/39/72 ?- CMP normal, glucose 98, BUN/creatinine 10/1.3, GFR 55, potassium 3.4 (3.5-5.2), Ca 8.1 (8.6-10.2) ?- CBC with differential: low Hb 11.3 (13-17.7), low RBC 3.57 (4.14-5.8) ?- ferritin 167 ?- PSA 0.5 ? ?09/30/2015: ?- Hep C virus antibody <0.1 ?- Lipids: 126/68/44/68 ?- CMP normal, except glucose 209, BUN/creatinine 22/1.39, GFR 51, potassium 5.4 (3.5-5.2), CO2 17 (18-29) ?- CBC with differential normal ?- B12 vitamin 305 (54-627) ? ?04/01/2015: ?- ACR 56.3 ?-  CBC with differential normal ?- Lipids: 116/56/42/63 ?- CMP: Glucose 193, BUN/creatinine 21/1.23, GFR 60, LFTs normal ?- PSA 0.5   ? ?-He has CKD stage III. ? ?-+ HL.  On Lipitor 20. ? ?He is on Neurontin. ? ?- last eye exam was on 05/2021: No DR ? ?- nonumbness and tingling in his feet.  ? ?He has a history of diverticulitis (saw Dr. Connery Shiffler Gong).  He had a colectomy in 05/2017 and had to have a colostomy bag.   He was feeling better after his colectomy.  He had hernia repair surgery and reversal of his ostomy 03/08/2018. ? ?He had KYHCW-23 complicated with pneumonia in 05/2019.  At that time, he was admitted with A. fib with RVR and was treated with remdesivir, convalescent plasma, dexamethasone.  ? ?ROS: ?+ see HPI ? ?I reviewed pt's medications, allergies, PMH, social hx, family hx, and changes were documented in the history of present illness. Otherwise, unchanged from my initial visit note. ? ?Past Medical History:  ?Diagnosis Date  ? AAA (abdominal aortic aneurysm)   ? Allergy   ? Atrial fibrillation [facility]) October 2013  ? Xarelto started; duration unknown  ? Cataract   ? Chronic kidney disease   ? Stage III   ? Coronary artery disease   ? post bare-metal stent to LAD in 2001  ? Diabetes mellitus   ? type 2   ? Hypertension   ? Myocardial infarct [facility]) 2001  ? Obesity   ? ?Past Surgical History:  ?Procedure Laterality Date  ? CARDIAC CATHETERIZATION  2006  ? stable with primarily nonobstructive disease  ? CARDIOVERSION  03/27/2012  ? Procedure: CARDIOVERSION;  Surgeon: Carlena Bjornstad, MD;  Location: [place];  Service: Cardiovascular;  Laterality: N/A;  ? COLOSTOMY TAKEDOWN N/A 03/08/2018  ? Procedure: LAPAROSCOPIC HARTMANNS REVERSAL;  Surgeon: Ileana Roup, MD;  [facility];  Service: General;  Laterality: N/A;  ? CORONARY ANGIOPLASTY WITH STENT PLACEMENT  2001  ? bare-metal stent to the LAD  ? CYSTOSCOPY W/ URETERAL STENT PLACEMENT Bilateral 03/08/2018  ? Procedure: CYSTOSCOPY WITH RETROGRADE AND BILATERAL URETERAL STENT PLACEMENT;  Surgeon: Lucas Mallow, MD;  [facility];  Service: Urology;  Laterality: Bilateral;  ? CYSTOSCOPY WITH STENT PLACEMENT Bilateral 06/03/2017  ? Procedure: CYSTOSCOPY WITH STENT PLACEMENT;  Surgeon: Festus Aloe, MD;  [facility];  Service: Urology;  Laterality: Bilateral;  ? FLEXIBLE SIGMOIDOSCOPY N/A 03/08/2018  ? Procedure: FLEXIBLE SIGMOIDOSCOPY;   Surgeon: Ileana Roup, MD;  [facility];  Service: General;  Laterality: N/A;  ? HERNIA REPAIR  2006  ? LAPAROSCOPIC PARASTOMAL HERNIA N/A 03/08/2018  ? Procedure: PARASTOMAL HERNIA REPAIR;  Surgeon: Ileana Roup, MD;  [facility];  Service: General;  Laterality: N/A;  ? LAPAROSCOPIC SIGMOID COLECTOMY N/A 06/03/2017  ? Procedure: LAPAROSCOPIC  SIGMOID COLECTOMY, COLOSTOMY;  Surgeon: Ileana Roup, MD;  [facility];  Service: General;  Laterality: N/A;  ? SKIN GRAFT  1981  ? ?Social History Main Topics  ? Smoking status: Former Smoker -- 0.50 packs/day for 40 years  ?  Types: Cigarettes, Pipe, Cigars  ?  Quit date: 05/11/1999  ? Smokeless tobacco: Never Used  ? Alcohol Use: No  ? Drug Use: No  ? ?Social History Narrative  ? Married   ? Retired: [employer]  ? 3 grown children  ? ?Current Outpatient Medications on File Prior to Visit  ?Medication Sig Dispense Refill  ? acetaminophen (TYLENOL) 500 MG tablet Take 1,000 mg  by mouth every 6 (six) hours as needed for mild pain, fever or headache.    ? ascorbic acid (VITAMIN C) 500 MG tablet Take 1 tablet (500 mg total) by mouth daily. (Patient not taking: Reported on 03/23/2021) 30 tablet 0  ? atorvastatin (LIPITOR) 20 MG tablet Take 20 mg by mouth at bedtime.     ? Continuous Blood Gluc Receiver (FREESTYLE LIBRE 2 READER) DEVI 1 each by Does not apply route daily. 1 each 0  ? Continuous Blood Gluc Sensor (FREESTYLE LIBRE 2 SENSOR) MISC 1 each by Does not apply route every 14 (fourteen) days. 6 each 3  ? Dulaglutide (TRULICITY) 4.5 LS/9.3TD SOPN Inject 4.5 mg into the skin once a week. 2 mL 3  ? glipiZIDE (GLUCOTROL) 5 MG tablet TAKE 1 TABLET (5 MG TOTAL) BY MOUTH 2 (TWO) TIMES DAILY BEFORE A MEAL. 180 tablet 3  ? glucose blood (FREESTYLE PRECISION NEO TEST) test strip Use as instructed to check blood sugar 3 times daily 300 each 3  ? insulin degludec (TRESIBA FLEXTOUCH) 200 UNIT/ML FlexTouch Pen INJECT 32 UNITS  SUBCUTANEOUSLY DAILY 9 mL 5  ?  insulin lispro (HUMALOG KWIKPEN) 200 UNIT/ML KwikPen Inject 8-12 units under skin 3x a day before meals 30 mL 3  ? Insulin Pen Needle (RELION PEN NEEDLES) 32G X 4 MM MISC USE 4X DAILY WITH  INSULIN 4

## 2021-09-16 NOTE — Patient Instructions (Addendum)
Please continue:  ?- Glipizide 5 mg 15-30 min before b'fast  ?- Trulicity 3 mg weekly ? ?Please decrease: ?- Tresiba 28 units daily ? ?Decrease: ?- Humalog 6-10 units 15 min before the main meals (decrease the dose to a maximum of 8 units before dinner) ? ?Please return in 4 months.  ?

## 2021-12-27 ENCOUNTER — Other Ambulatory Visit: Payer: Self-pay | Admitting: Internal Medicine

## 2022-01-01 ENCOUNTER — Other Ambulatory Visit: Payer: Self-pay | Admitting: Internal Medicine

## 2022-01-26 ENCOUNTER — Ambulatory Visit: Payer: Medicare Other | Admitting: Internal Medicine

## 2022-01-26 ENCOUNTER — Encounter: Payer: Self-pay | Admitting: Internal Medicine

## 2022-01-26 VITALS — BP 120/78 | HR 77 | Ht 73.5 in | Wt 249.8 lb

## 2022-01-26 DIAGNOSIS — E1159 Type 2 diabetes mellitus with other circulatory complications: Secondary | ICD-10-CM

## 2022-01-26 DIAGNOSIS — E1165 Type 2 diabetes mellitus with hyperglycemia: Secondary | ICD-10-CM

## 2022-01-26 DIAGNOSIS — E663 Overweight: Secondary | ICD-10-CM | POA: Diagnosis not present

## 2022-01-26 DIAGNOSIS — E7800 Pure hypercholesterolemia, unspecified: Secondary | ICD-10-CM

## 2022-01-26 LAB — POCT GLYCOSYLATED HEMOGLOBIN (HGB A1C): Hemoglobin A1C: 7.8 % — AB (ref 4.0–5.6)

## 2022-01-26 NOTE — Progress Notes (Signed)
Patient ID: MARCELLO TUZZOLINO, male   DOB: 06-Jan-1946, 76 y.o.   MRN: [id number]  HPI: JADRIEN NARINE is a 76 y.o.-year-old male, returning for f/u for DM2, dx 1995, insulin-dependent, uncontrolled, with complications (CKD stage 3, CAD - AMI, s/p stent LAD 2001). Last visit 4 months ago.  He is here with his wife, who is also my patient, and offers part of the history especially related to his blood sugars, insulin doses, and past medical history.  Interim history: No increased urination, blurry vision, nausea, chest pain.   He was diagnosed with pancreatic insufficiency by Dr. Siena Poehler Gong.  He continues to have occasional diarrhea. He has a broken tooth >> will have this pulled this afternoon. Sugars were higher.  He is preparing to see [facility].  Reviewed HbA1c levels: Lab Results  Component Value Date   HGBA1C 7.0 (A) 09/16/2021   HGBA1C 7.1 (A) 05/19/2021   HGBA1C 7.2 (A) 01/07/2021   He is on: - Trulicity 1.5  mg weekly >> 3 >> 4.5 >> 3 mg weekly  - Glipizide 5 mg 15-30 min before b'fast and dinner >> 5 mg before b'fast but he tells me that when the sugars are high, he also takes this at night! - Tresiba 32 >> 28 units daily - Humalog 8-12 units 15 min before the main meals, but up to 10 units before dinner >> 6-10 units before meals (up to 8 units before dinner) He was on the insulin 70/30 before admission for COVID-19 + pneumonia in 05/2019.  We stopped this in 12/2020. We tried low-dose metformin ER several times, but he could not tolerate it due to diarrhea >> now diarrhea is better He was on Tanzeum 50 mg weekly (started 09/2015) - >200$ per month! >> Trulicity 1.5 mg weekly He was previously on glipizide-metformin but could not tolerate it due to diarrhea -stopped 08/2017  Pt checks his sugars >4x a day with his freestyle libre 2 CGM:   Previously:   Previously:   Lowest sugar was 58 (diarrhea) >> 47 >> 60s >> 50s; he does not have hypoglycemia awareness! Highest  sugar was 226 >> 186 >> 259 .>> 250s Pt's meals are: - Breakfast: eggs, toast, ham/sausage or cereals + milk - Lunch: sandwich, vegetables, meat - Dinner: seafood, meat, vegetables - Snacks: nabs  Reviewed previous labs: 12/01/2021: Lipids: 130/62/44/73  Glucose 134, BUN/creatinine 22/1.74, GFR 40  05/22/2020: Lipids: 140/50/53/75 Glucose 131, BUN/creatinine 16/1.68, GFR 42  11/01/2019: Lipids: 128/52/49/67 Glucose 143, BUN/creatinine 16/1.48, GFR 46, CMP otherwise normal  04/30/2019: Lipids: 146/55/49/85 CMP normal, except BUN/creatinine 18/1.6, GFR 42.  His glucose was 84. PSA 0.6  04/26/2018: CMP normal with the exception of a slightly high glucose of 124, BUN/creatinine 19/1.5, GFR 46 and slightly higher chloride, of 107. Lipids: 147/58/47/88 CBC with slightly low red blood cells of 4.08 (4.14-5.8), normal hemoglobin of 13.3 and slightly high MCV of 99 (79-97) PSA 0.8  04/13/2017: - Lipids: 124/65/39/72 - CMP normal, glucose 98, BUN/creatinine 10/1.3, GFR 55, potassium 3.4 (3.5-5.2), Ca 8.1 (8.6-10.2) - CBC with differential: low Hb 11.3 (13-17.7), low RBC 3.57 (4.14-5.8) - ferritin 167 - PSA 0.5  09/30/2015: - Hep C virus antibody <0.1 - Lipids: 126/68/44/68 - CMP normal, except glucose 209, BUN/creatinine 22/1.39, GFR 51, potassium 5.4 (3.5-5.2), CO2 17 (18-29) - CBC with differential normal - B12 vitamin 305 (65-784)  04/01/2015: - ACR 56.3 - CBC with differential normal - Lipids: 116/56/42/63 - CMP: Glucose 193, BUN/creatinine 21/1.23, GFR 60, LFTs normal -  PSA 0.5    -He has CKD stage III.  -+ HL.  On Lipitor 20.  He is on Neurontin.  - last eye exam was on 05/2021: No DR  - nonumbness and tingling in his feet.   He has a history of diverticulitis (saw Dr. [place] Gong).  He had a colectomy in 05/2017 and had to have a colostomy bag.  He was feeling better after his colectomy.  He had hernia repair surgery and reversal of his ostomy 03/08/2018. He had  BHALP-37 complicated with pneumonia in 05/2019.  At that time, he was admitted with A. fib with RVR and was treated with remdesivir, convalescent plasma, dexamethasone.   ROS: + see HPI  I reviewed pt's medications, allergies, PMH, social hx, family hx, and changes were documented in the history of present illness. Otherwise, unchanged from my initial visit note.  Past Medical History:  Diagnosis Date   AAA (abdominal aortic aneurysm) ([place])    Allergy    Atrial fibrillation [facility]) October 2013   Xarelto started; duration unknown   Cataract    Chronic kidney disease    Stage III    Coronary artery disease    post bare-metal stent to LAD in 2001   Diabetes mellitus    type 2    Hypertension    Myocardial infarct [facility]) 2001   Obesity    Past Surgical History:  Procedure Laterality Date   CARDIAC CATHETERIZATION  2006   stable with primarily nonobstructive disease   CARDIOVERSION  03/27/2012   Procedure: CARDIOVERSION;  Surgeon: Carlena Bjornstad, MD;  Location: [facility];  Service: Cardiovascular;  Laterality: N/A;   COLOSTOMY TAKEDOWN N/A 03/08/2018   Procedure: LAPAROSCOPIC HARTMANNS REVERSAL;  Surgeon: Ileana Roup, MD;  [facility];  Service: General;  Laterality: N/A;   CORONARY ANGIOPLASTY WITH STENT PLACEMENT  2001   bare-metal stent to the LAD   CYSTOSCOPY W/ URETERAL STENT PLACEMENT Bilateral 03/08/2018   Procedure: CYSTOSCOPY WITH RETROGRADE AND BILATERAL URETERAL STENT PLACEMENT;  Surgeon: Lucas Mallow, MD;  [facility];  Service: Urology;  Laterality: Bilateral;   CYSTOSCOPY WITH STENT PLACEMENT Bilateral 06/03/2017   Procedure: CYSTOSCOPY WITH STENT PLACEMENT;  Surgeon: Festus Aloe, MD;  [facility];  Service: Urology;  Laterality: Bilateral;   FLEXIBLE SIGMOIDOSCOPY N/A 03/08/2018   Procedure: FLEXIBLE SIGMOIDOSCOPY;  Surgeon: Ileana Roup, MD;  [facility];  Service: General;  Laterality: N/A;   HERNIA REPAIR  2006    LAPAROSCOPIC PARASTOMAL HERNIA N/A 03/08/2018   Procedure: PARASTOMAL HERNIA REPAIR;  Surgeon: Ileana Roup, MD;  [facility];  Service: General;  Laterality: N/A;   LAPAROSCOPIC SIGMOID COLECTOMY N/A 06/03/2017   Procedure: LAPAROSCOPIC  SIGMOID COLECTOMY, COLOSTOMY;  Surgeon: Ileana Roup, MD;  [facility];  Service: General;  Laterality: N/A;   SKIN GRAFT  1981   Social History Main Topics   Smoking status: Former Smoker -- 0.50 packs/day for 40 years    Types: Cigarettes, Pipe, Cigars    Quit date: 05/11/1999   Smokeless tobacco: Never Used   Alcohol Use: No   Drug Use: No   Social History Narrative   Married    Retired: [employer]   3 grown children   Current Outpatient Medications on File Prior to Visit  Medication Sig Dispense Refill   acetaminophen (TYLENOL) 500 MG tablet Take 1,000 mg by mouth every 6 (six) hours as needed for mild pain, fever or headache.  ascorbic acid (VITAMIN C) 500 MG tablet Take 1 tablet (500 mg total) by mouth daily. (Patient not taking: Reported on 03/23/2021) 30 tablet 0   atorvastatin (LIPITOR) 20 MG tablet Take 20 mg by mouth at bedtime.      Continuous Blood Gluc Receiver (FREESTYLE LIBRE 2 READER) DEVI 1 each by Does not apply route daily. 1 each 0   Continuous Blood Gluc Sensor (FREESTYLE LIBRE 2 SENSOR) MISC 1 each by Does not apply route every 14 (fourteen) days. 6 each 3   Dulaglutide (TRULICITY) 4.5 PY/1.9JK SOPN Inject 4.5 mg into the skin once a week. 2 mL 3   glipiZIDE (GLUCOTROL) 5 MG tablet TAKE 1 TABLET (5 MG TOTAL) BY MOUTH 2 (TWO) TIMES DAILY BEFORE A MEAL. 180 tablet 3   glucose blood (FREESTYLE PRECISION NEO TEST) test strip Use as instructed to check blood sugar 3 times daily 300 each 3   insulin degludec (TRESIBA FLEXTOUCH) 200 UNIT/ML FlexTouch Pen INJECT 32 UNITS  SUBCUTANEOUSLY DAILY 9 mL 5   insulin lispro (HUMALOG KWIKPEN) 200 UNIT/ML KwikPen Inject 8-12 units under skin 3x a day before meals 30 mL 3    Insulin Pen Needle (RELION PEN NEEDLES) 32G X 4 MM MISC USE 4 TIMES DAILY WITH  INSULIN 400 each 0   metoprolol tartrate (LOPRESSOR) 25 MG tablet TAKE ONE-HALF TABLET BY  MOUTH TWICE DAILY 90 tablet 1   nitroGLYCERIN (NITROSTAT) 0.4 MG SL tablet Place 0.4 mg under the tongue every 5 (five) minutes as needed for chest pain.      omeprazole (PRILOSEC) 20 MG capsule Take 20 mg by mouth daily as needed (for acid reflux/indigestion).      TRULICITY 3 DT/2.6ZT SOPN INJECT THE CONTENTS OF ONE  PEN SUBCUTANEOUSLY WEEKLY  AS DIRECTED 6 mL 3   XARELTO 20 MG TABS tablet TAKE 1 TABLET BY MOUTH EVERY DAY 90 tablet 1   No current facility-administered medications on file prior to visit.   No Known Allergies Family History  Problem Relation Age of Onset   Heart disease Father    Heart attack Father    ALS Mother    Diabetes Sister    Hypertension Sister    Heart failure Brother    Stroke Neg Hx    PE: BP 120/78 (BP Location: Right Arm, Patient Position: Sitting, Cuff Size: Normal)   Pulse 77   Ht 6' 1.5" (1.867 m)   Wt 249 lb 12.8 oz (113.3 kg)   SpO2 96%   BMI 32.51 kg/m   Wt Readings from Last 3 Encounters:  01/26/22 249 lb 12.8 oz (113.3 kg)  09/16/21 247 lb 6.4 oz (112.2 kg)  05/19/21 250 lb 6.4 oz (113.6 kg)   Constitutional: overweight, in NAD Eyes: EOMI, no exophthalmos ENT: no thyromegaly, no cervical lymphadenopathy Cardiovascular: Irregularly irregular rhythm, RR, No MRG Respiratory: CTA B Musculoskeletal: no deformities Skin: warm, no rashes Neurological: no tremor with outstretched hands  ASSESSMENT: 1. DM2, insulin-dependent, uncontrolled, with complications - CAD - CKD stage 3  2. HL  3.  Overweight  PLAN:  1. Patient with longstanding, previously uncontrolled type 2 diabetes, with initially improved control after adding a GLP-1 receptor agonist, but afterwards with worse control and weight gain.  At last visit, HbA1c was 7.0%, lower.  At that time, sugars were  increasing slightly after meals but they were decreasing after dinner.  To decrease the risk of blood sugars dropping during the night and also throughout the day, I advised him to  decrease both Tresiba and also Humalog dose before dinner. CGM interpretation: -At today's visit, we reviewed his CGM downloads: It appears that 65% of values are in target range (goal >70%), while 33% are higher than 180 (goal <25%), and 2% are lower than 70 (goal <4%).  The calculated average blood sugar is 157.  The projected HbA1c for the next 3 months (GMI) is 7.1%. -Reviewing the CGM trends, sugars are increasing after breakfast and then slowly decreasing with most of the blood sugars at target after dinner.  During the night, he occasionally has lower blood sugars, 170s.  Upon questioning when sugars are higher at night, he takes another glipizide tablet to lower these.  I strongly advised him against this practice, which can lead to overnight hypoglycemia.  Also, to help with the post breakfast hyperglycemia, I advised him to increase his Humalog slightly before this meal.  Otherwise, we will continue the rest of the regimen. -I advised him to: Patient Instructions  Please continue:  - Glipizide 5 mg 15-30 min before b'fast (No Glipizide at night!) - Trulicity 3 mg weekly - Tresiba 28 units daily  Change: - Humalog  12-15 before b'fast 6-10 before lunch 8 before dinner  Please return in 4 months.   - we checked his HbA1c: 7.8% (higher) - advised to check sugars at different times of the day - 4x a day, rotating check times - advised for yearly eye exams >> he is UTD - return to clinic in 3-4 months  2. HL -Reviewed lipid panel from 11/2021: 130/62/44/73 -LDL was above our target of less than 55 due to history of cardiovascular disease, the rest of the fractions at goal -He continues on Lipitor 20 mg daily without side effects  3.  Overweight -He previously had good results in terms of weight loss with  Trulicity, losing 18 pounds after starting it but gaining most of it back.  Before last visit he lost 3 pounds. -He continues on Trulicity 3 mg weekly due to the lack of availability at the pharmacy for the higher dose. -He gained 2 lbs since last visit  Philemon Kingdom, MD PhD [facility] Endocrinology

## 2022-01-26 NOTE — Patient Instructions (Addendum)
Please continue:  - Glipizide 5 mg 15-30 min before b'fast (No Glipizide at night!) - Trulicity 3 mg weekly - Tresiba 28 units daily  Change: - Humalog  12-15 before b'fast 6-10 before lunch 8 before dinner  Please return in 4 months.

## 2022-01-27 ENCOUNTER — Telehealth: Payer: Self-pay

## 2022-01-27 NOTE — Telephone Encounter (Signed)
Inbound call from DME supplier requesting form be completed and faxed with clinical notes. DME supplies ordered via [employer] through online portal.

## 2022-02-06 ENCOUNTER — Other Ambulatory Visit: Payer: Self-pay | Admitting: Internal Medicine

## 2022-02-13 ENCOUNTER — Other Ambulatory Visit: Payer: Self-pay | Admitting: Cardiovascular Disease

## 2022-02-13 DIAGNOSIS — I480 Paroxysmal atrial fibrillation: Secondary | ICD-10-CM

## 2022-02-15 NOTE — Telephone Encounter (Signed)
Prescription refill request for Xarelto received.  Indication: Afib  Last office visit: 03/23/21 [name])  Weight: 113.3kg Age: 76 Scr: 1.74 (12/01/21 via [facility])  CrCl: 57.79m/min  Appropriate dose and refill sent to requested pharmacy.

## 2022-02-26 ENCOUNTER — Other Ambulatory Visit: Payer: Self-pay | Admitting: Cardiovascular Disease

## 2022-03-16 ENCOUNTER — Other Ambulatory Visit: Payer: Self-pay | Admitting: *Deleted

## 2022-03-16 DIAGNOSIS — I714 Abdominal aortic aneurysm, without rupture, unspecified: Secondary | ICD-10-CM

## 2022-03-17 ENCOUNTER — Ambulatory Visit: Payer: Medicare Other | Admitting: Physician Assistant

## 2022-03-17 ENCOUNTER — Ambulatory Visit (HOSPITAL_COMMUNITY)
Admission: RE | Admit: 2022-03-17 | Discharge: 2022-03-17 | Disposition: A | Discharge status: Home / Self Care | Payer: Medicare Other | Source: Ambulatory Visit | Attending: Vascular Surgery | Admitting: Vascular Surgery

## 2022-03-17 ENCOUNTER — Other Ambulatory Visit: Payer: Self-pay

## 2022-03-17 VITALS — BP 127/87 | HR 79 | Temp 97.9°F | Resp 20 | Ht 73.5 in | Wt 247.6 lb

## 2022-03-17 DIAGNOSIS — I714 Abdominal aortic aneurysm, without rupture, unspecified: Secondary | ICD-10-CM | POA: Diagnosis present

## 2022-03-17 DIAGNOSIS — I713 Abdominal aortic aneurysm, ruptured, unspecified: Secondary | ICD-10-CM

## 2022-03-17 NOTE — Progress Notes (Signed)
Office Note     CC:  follow up Requesting Provider:  Cyndi Bender, PA-C  HPI: [name] is a 76 y.o. (1946/02/05) male who presents for surveillance of abdominal aortic aneurysm as well as bilateral common iliac artery aneurysms.  1 year ago AAA measured 4.1 cm.  Common iliac aneurysms measured 1.7 cm.  He denies any new or changing abdominal or back pain.  He is ambulatory without any claudication.  He denies any rest pain or tissue loss of bilateral lower extremities.  He is on Eliquis for atrial fibrillation.  He takes Plavix daily.  Past medical history significant for CAD with history of coronary stenting in 2001.  He also has chronic kidney disease stage III as well as type 2 diabetes mellitus.  He denies tobacco use.   Past Medical History:  Diagnosis Date   AAA (abdominal aortic aneurysm) ([place])    Allergy    Atrial fibrillation [facility]) October 2013   Xarelto started; duration unknown   Cataract    Chronic kidney disease    Stage III    Coronary artery disease    post bare-metal stent to LAD in 2001   Diabetes mellitus    type 2    Hypertension    Myocardial infarct [facility] [place]) 2001   Obesity     Past Surgical History:  Procedure Laterality Date   CARDIAC CATHETERIZATION  2006   stable with primarily nonobstructive disease   CARDIOVERSION  03/27/2012   Procedure: CARDIOVERSION;  Surgeon: Carlena Bjornstad, MD;  Location: [facility] ENDOSCOPY;  Service: Cardiovascular;  Laterality: N/A;   COLOSTOMY TAKEDOWN N/A 03/08/2018   Procedure: LAPAROSCOPIC HARTMANNS REVERSAL;  Surgeon: Ileana Roup, MD;  Location: [facility];  Service: General;  Laterality: N/A;   CORONARY ANGIOPLASTY WITH STENT PLACEMENT  2001   bare-metal stent to the LAD   CYSTOSCOPY W/ URETERAL STENT PLACEMENT Bilateral 03/08/2018   Procedure: CYSTOSCOPY WITH RETROGRADE AND BILATERAL URETERAL STENT PLACEMENT;  Surgeon: Lucas Mallow, MD;  Location: [facility];  Service: Urology;  Laterality: Bilateral;   CYSTOSCOPY  WITH STENT PLACEMENT Bilateral 06/03/2017   Procedure: CYSTOSCOPY WITH STENT PLACEMENT;  Surgeon: Festus Aloe, MD;  Location: [facility];  Service: Urology;  Laterality: Bilateral;   FLEXIBLE SIGMOIDOSCOPY N/A 03/08/2018   Procedure: FLEXIBLE SIGMOIDOSCOPY;  Surgeon: Ileana Roup, MD;  Location: [facility];  Service: General;  Laterality: N/A;   HERNIA REPAIR  2006   LAPAROSCOPIC PARASTOMAL HERNIA N/A 03/08/2018   Procedure: PARASTOMAL HERNIA REPAIR;  Surgeon: Ileana Roup, MD;  Location: [facility];  Service: General;  Laterality: N/A;   LAPAROSCOPIC SIGMOID COLECTOMY N/A 06/03/2017   Procedure: LAPAROSCOPIC  SIGMOID COLECTOMY, COLOSTOMY;  Surgeon: Ileana Roup, MD;  Location: [facility];  Service: General;  Laterality: N/A;   SKIN GRAFT  1981    Social History   Socioeconomic History   Marital status: Married    Spouse name: Not on file   Number of children: Not on file   Years of education: Not on file   Highest education level: Not on file  Occupational History   Not on file  Tobacco Use   Smoking status: Former    Packs/day: 0.50    Years: 40.00    Total pack years: 20.00    Types: Cigarettes, Pipe, Cigars    Quit date: 05/11/1999    Years since quitting: 22.8    Passive exposure: Never   Smokeless tobacco: Never  Vaping Use   Vaping Use:  Never used  Substance and Sexual Activity   Alcohol use: No   Drug use: No   Sexual activity: Yes  Other Topics Concern   Not on file  Social History Narrative   Married    Retired: [employer]   3 grown children   Social Determinants of Radio broadcast assistant Strain: Not on file  Food Insecurity: Not on file  Transportation Needs: Not on file  Physical Activity: Not on file  Stress: Not on file  Social Connections: Not on file  Intimate Partner Violence: Not on file    Family History  Problem Relation Age of Onset   Heart disease Father    Heart attack Father    ALS Mother    Diabetes Sister    Hypertension  Sister    Heart failure Brother    Stroke Neg Hx     Current Outpatient Medications  Medication Sig Dispense Refill   acetaminophen (TYLENOL) 500 MG tablet Take 1,000 mg by mouth every 6 (six) hours as needed for mild pain, fever or headache.     ascorbic acid (VITAMIN C) 500 MG tablet Take 1 tablet (500 mg total) by mouth daily. 30 tablet 0   atorvastatin (LIPITOR) 20 MG tablet Take 20 mg by mouth at bedtime.      Continuous Blood Gluc Receiver (FREESTYLE LIBRE 2 READER) DEVI 1 each by Does not apply route daily. 1 each 0   Continuous Blood Gluc Sensor (FREESTYLE LIBRE 2 SENSOR) MISC 1 each by Does not apply route every 14 (fourteen) days. 6 each 3   Dulaglutide (TRULICITY) 4.5 GQ/6.7YP SOPN Inject 4.5 mg into the skin once a week. 2 mL 3   glipiZIDE (GLUCOTROL) 5 MG tablet TAKE 1 TABLET (5 MG TOTAL) BY MOUTH 2 (TWO) TIMES DAILY BEFORE A MEAL. 180 tablet 3   glucose blood (FREESTYLE PRECISION NEO TEST) test strip Use as instructed to check blood sugar 3 times daily 300 each 3   insulin degludec (TRESIBA FLEXTOUCH) 200 UNIT/ML FlexTouch Pen INJECT 32 UNITS  SUBCUTANEOUSLY DAILY 9 mL 5   insulin lispro (HUMALOG KWIKPEN) 200 UNIT/ML KwikPen Inject 8-12 units under skin 3x a day before meals 30 mL 3   Insulin Pen Needle (RELION PEN NEEDLES) 32G X 4 MM MISC USE 4 TIMES DAILY WITH  INSULIN 400 each 0   metoprolol tartrate (LOPRESSOR) 25 MG tablet TAKE ONE-HALF TABLET BY MOUTH  TWICE DAILY 90 tablet 0   nitroGLYCERIN (NITROSTAT) 0.4 MG SL tablet Place 0.4 mg under the tongue every 5 (five) minutes as needed for chest pain.      omeprazole (PRILOSEC) 20 MG capsule Take 20 mg by mouth daily as needed (for acid reflux/indigestion).      pantoprazole (PROTONIX) 40 MG tablet Take 40 mg by mouth daily.     TRULICITY 3 PJ/0.9TO SOPN INJECT THE CONTENTS OF ONE  PEN SUBCUTANEOUSLY WEEKLY  AS DIRECTED 6 mL 3   XARELTO 20 MG TABS tablet TAKE 1 TABLET BY MOUTH EVERY DAY 90 tablet 1   No current  facility-administered medications for this visit.    No Known Allergies   REVIEW OF SYSTEMS:   '[X]'$  denotes positive finding, '[ ]'$  denotes negative finding Cardiac  Comments:  Chest pain or chest pressure:    Shortness of breath upon exertion:    Short of breath when lying flat:    Irregular heart rhythm:        Vascular    Pain in calf, thigh,  or hip brought on by ambulation:    Pain in feet at night that wakes you up from your sleep:     Blood clot in your veins:    Leg swelling:         Pulmonary    Oxygen at home:    Productive cough:     Wheezing:         Neurologic    Sudden weakness in arms or legs:     Sudden numbness in arms or legs:     Sudden onset of difficulty speaking or slurred speech:    Temporary loss of vision in one eye:     Problems with dizziness:         Gastrointestinal    Blood in stool:     Vomited blood:         Genitourinary    Burning when urinating:     Blood in urine:        Psychiatric    Major depression:         Hematologic    Bleeding problems:    Problems with blood clotting too easily:        Skin    Rashes or ulcers:        Constitutional    Fever or chills:      PHYSICAL EXAMINATION:  Vitals:   03/17/22 0826  BP: 127/87  Pulse: 79  Resp: 20  Temp: 97.9 F (36.6 C)  TempSrc: Temporal  SpO2: 96%  Weight: 247 lb 9.6 oz (112.3 kg)  Height: 6' 1.5" (1.867 m)    General:  WDWN in NAD; vital signs documented above Gait: Not observed HENT: WNL, normocephalic Pulmonary: normal non-labored breathing , without Rales, rhonchi,  wheezing Cardiac: regular HR Abdomen: soft, NT, no masses Skin: without rashes Vascular Exam/Pulses: Symmetrical 2+ DP and femoral pulses Extremities: without ischemic changes, without Gangrene , without cellulitis; without open wounds;  Musculoskeletal: no muscle wasting or atrophy  Neurologic: A&O X 3;  No focal weakness or paresthesias are detected Psychiatric:  The pt has Normal  affect.   Non-Invasive Vascular Imaging:   AAA increased in size from 4.18 to 5.1 cm Common iliac artery aneurysms increased in size from 1.7 to 2.5 cm    ASSESSMENT/PLAN:: 76 y.o. male here for surveillance of AAA and bilateral common iliac artery aneurysms  -Patient has no new or changing abdominal or back pain; benign abdominal exam -Duplex demonstrates an increase in about 1 cm in size over the past year for abdominal aortic aneurysm.  Left common iliac artery aneurysm also increased in size from 1.7 to 2.5 cm.  Given the current size as well as rate of change, patient will have CT imaging in preparation for possible repair.  He has CKD stage III so CT abdomen and pelvis will be without contrast.  He will follow-up to review results with Dr. Donzetta Matters in the next few weeks.  Patient is agreeable to this plan.   Dagoberto Ligas, PA-C [facility] [phone]  Clinic MD:   Donzetta Matters

## 2022-03-18 ENCOUNTER — Other Ambulatory Visit: Payer: Self-pay

## 2022-03-18 MED ORDER — RELION PEN NEEDLES 32G X 4 MM MISC: 1 refills | Status: DC

## 2022-03-24 ENCOUNTER — Ambulatory Visit
Admission: RE | Admit: 2022-03-24 | Discharge: 2022-03-24 | Disposition: A | Discharge status: Home / Self Care | Payer: Medicare Other | Source: Ambulatory Visit | Attending: Vascular Surgery | Admitting: Vascular Surgery

## 2022-03-24 DIAGNOSIS — I713 Abdominal aortic aneurysm, ruptured, unspecified: Secondary | ICD-10-CM | POA: Diagnosis present

## 2022-03-24 DIAGNOSIS — I714 Abdominal aortic aneurysm, without rupture, unspecified: Secondary | ICD-10-CM | POA: Diagnosis present

## 2022-04-07 ENCOUNTER — Ambulatory Visit: Payer: Medicare Other | Admitting: Vascular Surgery

## 2022-04-07 VITALS — BP 118/75 | HR 76 | Temp 97.9°F | Resp 20 | Ht 73.5 in | Wt 247.0 lb

## 2022-04-07 DIAGNOSIS — I7143 Infrarenal abdominal aortic aneurysm, without rupture: Secondary | ICD-10-CM

## 2022-04-07 NOTE — Progress Notes (Signed)
Patient ID: [name], male   DOB: [birth year], 76 y.o.   MRN: [id number]  Reason for Consult: Follow-up   Referred by Cyndi Bender, PA-C  Subjective:     HPI:  [name] is a 76 y.o. male known history of abdominal aortic aneurysm.  No new back or abdominal pain.  Currently on Plavix has chronic kidney disease underwent CT scan for follow-up.  He is anticoagulated for atrial fibrillation.  Past Medical History:  Diagnosis Date   AAA (abdominal aortic aneurysm) ([place])    Allergy    Atrial fibrillation [facility]) October 2013   Xarelto started; duration unknown   Cataract    Chronic kidney disease    Stage III    Coronary artery disease    post bare-metal stent to LAD in 2001   Diabetes mellitus    type 2    Hypertension    Myocardial infarct ([place]) 2001   Obesity    Family History  Problem Relation Age of Onset   Heart disease Father    Heart attack Father    ALS Mother    Diabetes Sister    Hypertension Sister    Heart failure Brother    Stroke Neg Hx    Past Surgical History:  Procedure Laterality Date   CARDIAC CATHETERIZATION  2006   stable with primarily nonobstructive disease   CARDIOVERSION  03/27/2012   Procedure: CARDIOVERSION;  Surgeon: Carlena Bjornstad, MD;  Location: [facility] ENDOSCOPY;  Service: Cardiovascular;  Laterality: N/A;   COLOSTOMY TAKEDOWN N/A 03/08/2018   Procedure: LAPAROSCOPIC HARTMANNS REVERSAL;  Surgeon: Ileana Roup, MD;  Location: [facility];  Service: General;  Laterality: N/A;   CORONARY ANGIOPLASTY WITH STENT PLACEMENT  2001   bare-metal stent to the LAD   CYSTOSCOPY W/ URETERAL STENT PLACEMENT Bilateral 03/08/2018   Procedure: CYSTOSCOPY WITH RETROGRADE AND BILATERAL URETERAL STENT PLACEMENT;  Surgeon: Lucas Mallow, MD;  Location: [facility];  Service: Urology;  Laterality: Bilateral;   CYSTOSCOPY WITH STENT PLACEMENT Bilateral 06/03/2017   Procedure: CYSTOSCOPY WITH STENT PLACEMENT;  Surgeon: Festus Aloe, MD;  Location:  [facility];  Service: Urology;  Laterality: Bilateral;   FLEXIBLE SIGMOIDOSCOPY N/A 03/08/2018   Procedure: FLEXIBLE SIGMOIDOSCOPY;  Surgeon: Ileana Roup, MD;  Location: [facility];  Service: General;  Laterality: N/A;   HERNIA REPAIR  2006   LAPAROSCOPIC PARASTOMAL HERNIA N/A 03/08/2018   Procedure: PARASTOMAL HERNIA REPAIR;  Surgeon: Ileana Roup, MD;  [facility];  Service: General;  Laterality: N/A;   LAPAROSCOPIC SIGMOID COLECTOMY N/A 06/03/2017   Procedure: LAPAROSCOPIC  SIGMOID COLECTOMY, COLOSTOMY;  Surgeon: Ileana Roup, MD;  Location: [facility];  Service: General;  Laterality: N/A;   SKIN GRAFT  1981    Short Social History:  Social History   Tobacco Use   Smoking status: Former    Packs/day: 0.50    Years: 40.00    Total pack years: 20.00    Types: Cigarettes, Pipe, Cigars    Quit date: 05/11/1999    Years since quitting: 22.9    Passive exposure: Never   Smokeless tobacco: Never  Substance Use Topics   Alcohol use: No    No Known Allergies  Current Outpatient Medications  Medication Sig Dispense Refill   acetaminophen (TYLENOL) 500 MG tablet Take 1,000 mg by mouth every 6 (six) hours as needed for mild pain, fever or headache.     ascorbic acid (VITAMIN C) 500 MG tablet Take 1 tablet (500  mg total) by mouth daily. 30 tablet 0   atorvastatin (LIPITOR) 20 MG tablet Take 20 mg by mouth at bedtime.      Continuous Blood Gluc Receiver (FREESTYLE LIBRE 2 READER) DEVI 1 each by Does not apply route daily. 1 each 0   Continuous Blood Gluc Sensor (FREESTYLE LIBRE 2 SENSOR) MISC 1 each by Does not apply route every 14 (fourteen) days. 6 each 3   Dulaglutide (TRULICITY) 4.5 ZO/1.0RU SOPN Inject 4.5 mg into the skin once a week. 2 mL 3   glipiZIDE (GLUCOTROL) 5 MG tablet TAKE 1 TABLET (5 MG TOTAL) BY MOUTH 2 (TWO) TIMES DAILY BEFORE A MEAL. 180 tablet 3   glucose blood (FREESTYLE PRECISION NEO TEST) test strip Use as instructed to check blood sugar 3 times daily  300 each 3   insulin degludec (TRESIBA FLEXTOUCH) 200 UNIT/ML FlexTouch Pen INJECT 32 UNITS  SUBCUTANEOUSLY DAILY 9 mL 5   insulin lispro (HUMALOG KWIKPEN) 200 UNIT/ML KwikPen Inject 8-12 units under skin 3x a day before meals 30 mL 3   Insulin Pen Needle (RELION PEN NEEDLES) 32G X 4 MM MISC USE 4 TIMES DAILY WITH  INSULIN 400 each 1   metoprolol tartrate (LOPRESSOR) 25 MG tablet TAKE ONE-HALF TABLET BY MOUTH  TWICE DAILY 90 tablet 0   nitroGLYCERIN (NITROSTAT) 0.4 MG SL tablet Place 0.4 mg under the tongue every 5 (five) minutes as needed for chest pain.      omeprazole (PRILOSEC) 20 MG capsule Take 20 mg by mouth daily as needed (for acid reflux/indigestion).      pantoprazole (PROTONIX) 40 MG tablet Take 40 mg by mouth daily.     TRULICITY 3 EA/5.4UJ SOPN INJECT THE CONTENTS OF ONE  PEN SUBCUTANEOUSLY WEEKLY  AS DIRECTED 6 mL 3   XARELTO 20 MG TABS tablet TAKE 1 TABLET BY MOUTH EVERY DAY 90 tablet 1   No current facility-administered medications for this visit.    Review of Systems  Constitutional:  Constitutional negative. HENT: HENT negative.  Eyes: Eyes negative.  Respiratory: Respiratory negative.  Cardiovascular: Cardiovascular negative.  GI: Gastrointestinal negative.  Musculoskeletal: Musculoskeletal negative.  Skin: Skin negative.  Neurological: Neurological negative. Hematologic: Hematologic/lymphatic negative.  Psychiatric: Psychiatric negative.        Objective:  Objective   Vitals:   04/07/22 1507  BP: 118/75  Pulse: 76  Resp: 20  Temp: 97.9 F (36.6 C)  SpO2: 94%  Weight: 247 lb (112 kg)  Height: 6' 1.5" (1.867 m)   Body mass index is 32.15 kg/m.  Physical Exam Constitutional:      Appearance: He is obese.  HENT:     Head: Normocephalic.     Nose: Nose normal.  Eyes:     Pupils: Pupils are equal, round, and reactive to light.  Cardiovascular:     Pulses:          Popliteal pulses are 2+ on the right side and 3+ on the left side.  Pulmonary:      Effort: Pulmonary effort is normal.  Abdominal:     General: Abdomen is flat.     Palpations: Abdomen is soft. There is no mass.  Musculoskeletal:     Right lower leg: No edema.     Left lower leg: No edema.  Skin:    General: Skin is warm and dry.     Capillary Refill: Capillary refill takes less than 2 seconds.  Neurological:     General: No focal deficit present.  Mental Status: He is alert.  Psychiatric:        Mood and Affect: Mood normal.     Data: CT IMPRESSION: 1. 4.7 cm infrarenal abdominal aortic aneurysm, previously 4.4 cm in 2018. Recommend follow-up CT/MR every 6 months and vascular consultation. This recommendation follows ACR consensus guidelines: White Paper of the ACR Incidental Findings Committee II on Vascular Findings. J Am Coll Radiol 2013; 10:789-794. 2. 2.1 cm right and 2 cm left common iliac artery aneurysms. 3. Colonic diverticulosis.     Assessment/Plan:    76 year old male now with 4.7 cm infrarenal abdominal arctic aneurysm.  We reviewed his CT scan together today.  We discussed the signs symptoms of rupture as well as his risks currently being less than 5% annually.  Plan will be to repeat ultrasound in 6 months which we can correlate to his most recent CT scan if there is enlargement we can consider CT scan again in 6 months after that unless it is measuring far greater than 5.1 cm which it did last ultrasound.  He does have strong popliteal pulses but these were checked for aneurysm a few years ago and were noted to be normal in size.     Waynetta Sandy MD [facility]

## 2022-04-14 ENCOUNTER — Other Ambulatory Visit: Payer: Self-pay

## 2022-04-14 DIAGNOSIS — I7143 Infrarenal abdominal aortic aneurysm, without rupture: Secondary | ICD-10-CM

## 2022-04-21 NOTE — Progress Notes (Signed)
This encounter was created in error - please disregard.

## 2022-04-25 ENCOUNTER — Other Ambulatory Visit: Payer: Self-pay | Admitting: Internal Medicine

## 2022-04-26 ENCOUNTER — Telehealth: Payer: Self-pay | Admitting: Internal Medicine

## 2022-04-26 DIAGNOSIS — E1159 Type 2 diabetes mellitus with other circulatory complications: Secondary | ICD-10-CM

## 2022-04-26 MED ORDER — HUMALOG KWIKPEN 200 UNIT/ML ~~LOC~~ SOPN: PEN_INJECTOR | SUBCUTANEOUS | 3 refills | Status: DC

## 2022-04-26 NOTE — Telephone Encounter (Signed)
RX now sent to preferred pharmacy.  

## 2022-04-26 NOTE — Telephone Encounter (Signed)
MEDICATION: HumaLOG KwikPen insulin lispro (HUMALOG KWIKPEN) 200 UNIT/ML KwikPen  PHARMACY:    [place], [place] (Ph: [phone]  But [facility] wants [facility] use [facility] since out of medication [id number] [place]Ph: [phone]  HAS THE PATIENT CONTACTED THEIR PHARMACY?  Yes  IS THIS A 90 DAY SUPPLY : Yes  IS PATIENT OUT OF MEDICATION: Yes  IF NOT; HOW MUCH IS LEFT:   LAST APPOINTMENT DATE: '@9'$ /19/2023  NEXT APPOINTMENT DATE:'@1'$ /23/2024  DO WE HAVE YOUR PERMISSION TO LEAVE A DETAILED MESSAGE?:  OTHER COMMENTS: Patient's spouse states that they would like 1 refill sent [facility]   [id number] [phone]  Because he is completely out of medication since yesterday   **Let patient know to contact pharmacy at the end of the day to make sure medication is ready. **  ** Please notify patient to allow 48-72 hours to process**  **Encourage patient to contact the pharmacy for refills or [facility] can [facility]

## 2022-04-29 ENCOUNTER — Other Ambulatory Visit: Payer: Self-pay | Admitting: Cardiovascular Disease

## 2022-05-04 ENCOUNTER — Other Ambulatory Visit: Payer: Self-pay | Admitting: Internal Medicine

## 2022-05-21 ENCOUNTER — Other Ambulatory Visit: Payer: Self-pay | Admitting: Cardiovascular Disease

## 2022-06-01 ENCOUNTER — Encounter: Payer: Self-pay | Admitting: Internal Medicine

## 2022-06-01 ENCOUNTER — Ambulatory Visit: Payer: Medicare Other | Admitting: Internal Medicine

## 2022-06-01 VITALS — BP 120/76 | HR 102 | Ht 73.5 in | Wt 244.0 lb

## 2022-06-01 DIAGNOSIS — E663 Overweight: Secondary | ICD-10-CM

## 2022-06-01 DIAGNOSIS — E7800 Pure hypercholesterolemia, unspecified: Secondary | ICD-10-CM

## 2022-06-01 DIAGNOSIS — E1165 Type 2 diabetes mellitus with hyperglycemia: Secondary | ICD-10-CM

## 2022-06-01 DIAGNOSIS — E1159 Type 2 diabetes mellitus with other circulatory complications: Secondary | ICD-10-CM

## 2022-06-01 LAB — POCT GLYCOSYLATED HEMOGLOBIN (HGB A1C): Hemoglobin A1C: 7 % — AB (ref 4.0–5.6)

## 2022-06-01 NOTE — Progress Notes (Signed)
Patient ID: [name], male   DOB: 03-Jun-1945, 77 y.o.   MRN: [id number]  HPI: [name] is a 77 y.o.-year-old male, returning for f/u for DM2, dx 1995, insulin-dependent, uncontrolled, with complications (CKD stage 3, CAD - AMI, s/p stent LAD 2001). Last visit 4 months ago.  He is here with his wife, who is also my patient, and offers part of the history especially related to his blood sugars, insulin doses, and past medical history.  Interim history: No increased urination, blurry vision, nausea, chest pain.   He was diagnosed with pancreatic insufficiency by Dr. Icyss Skog Gong.  He continues to have occasional diarrhea. He is currently investigated for AAA -increased from 4.4 cm in 2018 to 4.7 cm now (CT scan from 03/25/2022). He sees Design[employer], fashion/cl[employer].  Reviewed HbA1c levels: Lab Results  Component Value Date   HGBA1C 7.8 (A) 01/26/2022   HGBA1C 7.0 (A) 09/16/2021   HGBA1C 7.1 (A) 05/19/2021   He is on: - Trulicity 1.5  mg weekly >> 3 >> 4.5 >> 3 mg weekly  - Glipizide 5 mg 15-30 min before b'fast and dinner >> 5 mg before b'fast- Tresiba 32 >> 28 units daily - Humalog >> 12-15 before b'fast 6-10 before lunch 8 >> 4-8 before dinner He was on the insulin 70/30 before admission for COVID-19 + pneumonia in 05/2019.  We stopped this in 12/2020. We tried low-dose metformin ER several times, but he could not tolerate it due to diarrhea >> now diarrhea is better He was on Tanzeum 50 mg weekly (started 09/2015) - >200$ per month! >> Trulicity 1.5 mg weekly He was previously on glipizide-metformin but could not tolerate it due to diarrhea -stopped 08/2017  Pt checks his sugars >4x a day with his freestyle libre 2 CGM:  Previously:   Previously:   Lowest sugar was 47 >> 60s >> 50s >> 60-70s; he does not have hypoglycemia awareness! Highest sugar was 259 >> 250s >> 178. Pt's meals are: - Breakfast: eggs, toast, ham/sausage or cereals + milk - Lunch: sandwich, vegetables, meat -  Dinner: seafood, meat, vegetables - Snacks: nabs  Reviewed previous labs: 12/01/2021: Lipids: 130/62/44/73  Glucose 134, BUN/creatinine 22/1.74, GFR 40  05/22/2020: Lipids: 140/50/53/75 Glucose 131, BUN/creatinine 16/1.68, GFR 42  11/01/2019: Lipids: 128/52/49/67 Glucose 143, BUN/creatinine 16/1.48, GFR 46, CMP otherwise normal  04/30/2019: Lipids: 146/55/49/85 CMP normal, except BUN/creatinine 18/1.6, GFR 42.  His glucose was 84. PSA 0.6  04/26/2018: CMP normal with the exception of a slightly high glucose of 124, BUN/creatinine 19/1.5, GFR 46 and slightly higher chloride, of 107. Lipids: 147/58/47/88 CBC with slightly low red blood cells of 4.08 (4.14-5.8), normal hemoglobin of 13.3 and slightly high MCV of 99 (79-97) PSA 0.8  04/13/2017: - Lipids: 124/65/39/72 - CMP normal, glucose 98, BUN/creatinine 10/1.3, GFR 55, potassium 3.4 (3.5-5.2), Ca 8.1 (8.6-10.2) - CBC with differential: low Hb 11.3 (13-17.7), low RBC 3.57 (4.14-5.8) - ferritin 167 - PSA 0.5  09/30/2015: - Hep C virus antibody <0.1 - Lipids: 126/68/44/68 - CMP normal, except glucose 209, BUN/creatinine 22/1.39, GFR 51, potassium 5.4 (3.5-5.2), CO2 17 (18-29) - CBC with differential normal - B12 vitamin 305 (50-539)  04/01/2015: - ACR 56.3 - CBC with differential normal - Lipids: 116/56/42/63 - CMP: Glucose 193, BUN/creatinine 21/1.23, GFR 60, LFTs normal - PSA 0.5    -He has CKD stage III.  -+ HL.  On Lipitor 20.  He is on Neurontin.  - last eye exam was on 05/2021: No DR.   -  no numbness and tingling in his feet.  Last foot exam 01/26/2022.  He has a history of diverticulitis (saw Dr. Lavanda Nevels Gong).  He had a colectomy in 05/2017 and had to have a colostomy bag.  He was feeling better after his colectomy.  He had hernia repair surgery and reversal of his ostomy 03/08/2018. He had HALPF-79 complicated with pneumonia in 05/2019.  At that time, he was admitted with A. fib with RVR and was treated with  remdesivir, convalescent plasma, dexamethasone.   ROS: + see HPI  I reviewed pt's medications, allergies, PMH, social hx, family hx, and changes were documented in the history of present illness. Otherwise, unchanged from my initial visit note.  Past Medical History:  Diagnosis Date   AAA (abdominal aortic aneurysm) ([place])    Allergy    Atrial fibrillation [facility]) October 2013   Xarelto started; duration unknown   Cataract    Chronic kidney disease    Stage III    Coronary artery disease    post bare-metal stent to LAD in 2001   Diabetes mellitus    type 2    Hypertension    Myocardial infarct [facility]) 2001   Obesity    Past Surgical History:  Procedure Laterality Date   CARDIAC CATHETERIZATION  2006   stable with primarily nonobstructive disease   CARDIOVERSION  03/27/2012   Procedure: CARDIOVERSION;  Surgeon: Carlena Bjornstad, MD;  Location: [facility] ENDOSCOPY;  Service: Cardiovascular;  Laterality: N/A;   COLOSTOMY TAKEDOWN N/A 03/08/2018   Procedure: LAPAROSCOPIC HARTMANNS REVERSAL;  Surgeon: Ileana Roup, MD;  Location: [facility] ORS;  Service: General;  Laterality: N/A;   CORONARY ANGIOPLASTY WITH STENT PLACEMENT  2001   bare-metal stent to the LAD   CYSTOSCOPY W/ URETERAL STENT PLACEMENT Bilateral 03/08/2018   Procedure: CYSTOSCOPY WITH RETROGRADE AND BILATERAL URETERAL STENT PLACEMENT;  Surgeon: Lucas Mallow, MD;  Location: [facility] ORS;  Service: Urology;  Laterality: Bilateral;   CYSTOSCOPY WITH STENT PLACEMENT Bilateral 06/03/2017   Procedure: CYSTOSCOPY WITH STENT PLACEMENT;  Surgeon: Festus Aloe, MD;  Location: [facility] ORS;  Service: Urology;  Laterality: Bilateral;   FLEXIBLE SIGMOIDOSCOPY N/A 03/08/2018   Procedure: FLEXIBLE SIGMOIDOSCOPY;  Surgeon: Ileana Roup, MD;  Location: [facility] ORS;  Service: General;  Laterality: N/A;   HERNIA REPAIR  2006   LAPAROSCOPIC PARASTOMAL HERNIA N/A 03/08/2018   Procedure: PARASTOMAL HERNIA REPAIR;  Surgeon: Ileana Roup, MD;   Location: [facility] ORS;  Service: General;  Laterality: N/A;   LAPAROSCOPIC SIGMOID COLECTOMY N/A 06/03/2017   Procedure: LAPAROSCOPIC  SIGMOID COLECTOMY, COLOSTOMY;  Surgeon: Ileana Roup, MD;  Location: [facility] ORS;  Service: General;  Laterality: N/A;   SKIN GRAFT  1981   Social History Main Topics   Smoking status: Former Smoker -- 0.50 packs/day for 40 years    Types: Cigarettes, Pipe, Cigars    Quit date: 05/11/1999   Smokeless tobacco: Never Used   Alcohol Use: No   Drug Use: No   Social History Narrative   Married    Retired: [employer]   3 grown children   Current Outpatient Medications on File Prior to Visit  Medication Sig Dispense Refill   acetaminophen (TYLENOL) 500 MG tablet Take 1,000 mg by mouth every 6 (six) hours as needed for mild pain, fever or headache.     ascorbic acid (VITAMIN C) 500 MG tablet Take 1 tablet (500 mg total) by mouth daily. 30 tablet 0   atorvastatin (LIPITOR) 20 MG tablet Take 20 mg  by mouth at bedtime.      Continuous Blood Gluc Receiver (FREESTYLE LIBRE 2 READER) DEVI 1 each by Does not apply route daily. 1 each 0   Continuous Blood Gluc Sensor (FREESTYLE LIBRE 2 SENSOR) MISC 1 each by Does not apply route every 14 (fourteen) days. 6 each 3   Dulaglutide (TRULICITY) 4.5 UY/4.0HK SOPN Inject 4.5 mg into the skin once a week. 2 mL 3   glipiZIDE (GLUCOTROL) 5 MG tablet TAKE 1 TABLET (5 MG TOTAL) BY MOUTH 2 (TWO) TIMES DAILY BEFORE A MEAL. 180 tablet 3   glucose blood (FREESTYLE PRECISION NEO TEST) test strip Use as instructed to check blood sugar 3 times daily 300 each 3   insulin degludec (TRESIBA FLEXTOUCH) 200 UNIT/ML FlexTouch Pen INJECT 32 UNITS SUBCUTANEOUSLY  DAILY 18 mL 1   insulin lispro (HUMALOG KWIKPEN) 200 UNIT/ML KwikPen INJECT 8 TO 12 UNITS  SUBCUTANEOUSLY 3 TIMES  DAILY BEFORE MEALS 18 mL 3   Insulin Pen Needle (RELION PEN NEEDLES) 32G X 4 MM MISC USE 4 TIMES DAILY WITH  INSULIN 400 each 1   metoprolol tartrate (LOPRESSOR) 25 MG tablet Take  0.5 tablets (12.5 mg total) by mouth 2 (two) times daily. Pt.needs to schedule an appt.with Dr. Angelena Form in order to receive anymore refills. Thank You. 1st Attempt. 30 tablet 0   nitroGLYCERIN (NITROSTAT) 0.4 MG SL tablet Place 0.4 mg under the tongue every 5 (five) minutes as needed for chest pain.      omeprazole (PRILOSEC) 20 MG capsule Take 20 mg by mouth daily as needed (for acid reflux/indigestion).      pantoprazole (PROTONIX) 40 MG tablet Take 40 mg by mouth daily.     TRULICITY 3 VQ/2.5ZD SOPN INJECT THE CONTENTS OF ONE  PEN SUBCUTANEOUSLY WEEKLY  AS DIRECTED 6 mL 3   XARELTO 20 MG TABS tablet TAKE 1 TABLET BY MOUTH EVERY DAY 90 tablet 1   No current facility-administered medications on file prior to visit.   No Known Allergies Family History  Problem Relation Age of Onset   Heart disease Father    Heart attack Father    ALS Mother    Diabetes Sister    Hypertension Sister    Heart failure Brother    Stroke Neg Hx    PE: BP 120/76 (BP Location: Right Arm, Patient Position: Sitting, Cuff Size: Normal)   Pulse (!) 102   Ht 6' 1.5" (1.867 m)   Wt 244 lb (110.7 kg)   SpO2 96%   BMI 31.76 kg/m   Wt Readings from Last 3 Encounters:  06/01/22 244 lb (110.7 kg)  04/07/22 247 lb (112 kg)  03/17/22 247 lb 9.6 oz (112.3 kg)   Constitutional: overweight, in NAD Eyes: EOMI, no exophthalmos ENT: no thyromegaly, no cervical lymphadenopathy Cardiovascular: Irregularly irregular rhythm, tachycardia, No MRG Respiratory: CTA B Musculoskeletal: no deformities Skin: no rashes Neurological: no tremor with outstretched hands  ASSESSMENT: 1. DM2, insulin-dependent, uncontrolled, with complications - CAD - CKD stage 3  2. HL  3.  Overweight  PLAN:  1. Patient with longstanding, previously uncontrolled type 2 diabetes, with initially improved control after adding a GLP-1 receptor agonist, but afterwards with deteriorating control and weight gain.  At last visit, HbA1c was higher,  at 7.8%, increased from 7.0% previously.  At that time, sugars were increasing after breakfast and then slowly decreasing with most of the blood sugars at target after dinner.  During the night, he will occasionally have lower blood sugars  in the 70s upon questioning, he was taking the glipizide dose if sugars are higher at night, in an effort to bring down down.  I strongly advised him against this practice, which could lead to overnight hypoglycemia.  I advised him to increase his Humalog dose before breakfast, but otherwise continue the rest of the regimen. CGM interpretation: -At today's visit, we reviewed his CGM downloads: It appears that 88% of values are in target range (goal >70%), while 11% are higher than 180 (goal <25%), and 1% are lower than 70 (goal <4%).  The calculated average blood sugar is 137.  The projected HbA1c for the next 3 months ([facility]MI) is 6.6%. -Reviewing the CGM trends, sugars appear to be much better controlled than at last visit, only occasionally increasing after lunch, but otherwise with the vast majority of his blood sugars at goal.  It seems like this regimen is working well for him so for now I advised him to continue it.  He is using a lower dose of Humalog occasionally before dinner when sugars are lower, which we will continue. -I advised him to: Patient Instructions  Please continue:  - Glipizide 5 mg 15-30 min before b'fast  - Trulicity 3 mg weekly - Tresiba 28 units daily - Humalog  12-15 before b'fast 6-10 before lunch 4-8 before dinner  Please return in 4 months.   - we checked his HbA1c: 7.0% (better) - advised to check sugars at different times of the day - 4x a day, rotating check times - advised for yearly eye exams >> he is UTD - return to clinic in 4 months  2. HL -Reviewed latest lipid panel from 11/2021: 130/62/44/73 -LDL was above our target of less than 55 due to history of cardiovascular disease, the rest of the fractions at goal -He  continues on Lipitor 20 mg daily without side effects  3.  Overweight -He continues on Trulicity 3 mg weekly, tolerated well.  We could not use the 4.5 mg weekly dose due to unavailability at his pharmacy -He previously had good results in terms of weight loss with Trulicity, losing 18 pounds after starting it but gaining most of it back.  Before last 2 visits, he gained 2 pounds.  Philemon Kingdom, MD PhD [facility] Endocrinology

## 2022-06-01 NOTE — Patient Instructions (Signed)
Please continue:  - Glipizide 5 mg 15-30 min before b'fast  - Trulicity 3 mg weekly - Tresiba 28 units daily - Humalog  12-15 before b'fast 6-10 before lunch 4-8 before dinner  Please return in 4 months.

## 2022-06-03 LAB — HM DIABETES EYE EXAM

## 2022-06-04 ENCOUNTER — Encounter: Payer: Self-pay | Admitting: Internal Medicine

## 2022-06-11 ENCOUNTER — Other Ambulatory Visit: Payer: Self-pay | Admitting: Cardiovascular Disease

## 2022-06-18 ENCOUNTER — Other Ambulatory Visit: Payer: Self-pay | Admitting: Cardiovascular Disease

## 2022-08-16 ENCOUNTER — Other Ambulatory Visit: Payer: Self-pay | Admitting: Cardiovascular Disease

## 2022-08-16 DIAGNOSIS — I480 Paroxysmal atrial fibrillation: Secondary | ICD-10-CM

## 2022-08-16 NOTE — Telephone Encounter (Signed)
Prescription refill request for Xarelto received.  Indication: Last office visit: [name], 03/23/2021 Weight: 110.7 kg  Age: 77 yo  Scr: 1.71, 06/10/2022 CrCl:  58 ml/min   Pt is overdue for an office visit. Msg sent to schedulers.

## 2022-08-16 NOTE — Telephone Encounter (Signed)
Pt scheduled appt with Robin Searing on 09/28/22. Refill sent to last until upcoming appt to prevent any missed doses.

## 2022-09-27 NOTE — Progress Notes (Unsigned)
Office Visit    Patient Name: [name] Date of Encounter: 09/28/2022  Primary Care Provider:  Lonie Peak, PA-C Primary Cardiologist:  Verne Carrow, MD Primary Electrophysiologist: None   Past Medical History    Past Medical History:  Diagnosis Date   AAA (abdominal aortic aneurysm) [facility])    Allergy    Atrial fibrillation [facility]) October 2013   Xarelto started; duration unknown   Cataract    Chronic kidney disease    Stage III    Coronary artery disease    post bare-metal stent to LAD in 2001   Diabetes mellitus    type 2    Hypertension    Myocardial infarct [facility]) 2001   Obesity    Past Surgical History:  Procedure Laterality Date   CARDIAC CATHETERIZATION  2006   stable with primarily nonobstructive disease   CARDIOVERSION  03/27/2012   Procedure: CARDIOVERSION;  Surgeon: Luis Abed, MD;  Location: [facility] ENDOSCOPY;  Service: Cardiovascular;  Laterality: N/A;   COLOSTOMY TAKEDOWN N/A 03/08/2018   Procedure: LAPAROSCOPIC HARTMANNS REVERSAL;  Surgeon: Andria Meuse, MD;  Location: [facility] ORS;  Service: General;  Laterality: N/A;   CORONARY ANGIOPLASTY WITH STENT PLACEMENT  2001   bare-metal stent to the LAD   CYSTOSCOPY W/ URETERAL STENT PLACEMENT Bilateral 03/08/2018   Procedure: CYSTOSCOPY WITH RETROGRADE AND BILATERAL URETERAL STENT PLACEMENT;  Surgeon: Crista Elliot, MD;  Location: [facility] ORS;  Service: Urology;  Laterality: Bilateral;   CYSTOSCOPY WITH STENT PLACEMENT Bilateral 06/03/2017   Procedure: CYSTOSCOPY WITH STENT PLACEMENT;  Surgeon: Jerilee Field, MD;  Location: [facility]S;  Service: Urology;  Laterality: Bilateral;   FLEXIBLE SIGMOIDOSCOPY N/A 03/08/2018   Procedure: FLEXIBLE SIGMOIDOSCOPY;  Surgeon: Andria Meuse, MD;  Location: [facility] ORS;  Service: General;  Laterality: N/A;   HERNIA REPAIR  2006   LAPAROSCOPIC PARASTOMAL HERNIA N/A 03/08/2018   Procedure: PARASTOMAL HERNIA REPAIR;  Surgeon: Andria Meuse, MD;   Location: [facility]S;  Service: General;  Laterality: N/A;   LAPAROSCOPIC SIGMOID COLECTOMY N/A 06/03/2017   Procedure: LAPAROSCOPIC  SIGMOID COLECTOMY, COLOSTOMY;  Surgeon: Andria Meuse, MD;  Location: [facility]S;  Service: General;  Laterality: N/A;   SKIN GRAFT  1981    Allergies  No Known Allergies   History of Present Illness    [name]  is a 77 year old male with a PMH of CAD s/p anterior wall MI 2001 treated with BMS to LAD, repeat LHC 2006 with 80% narrowing of diagonal branch treated medically, HTN, HLD, atrial fibrillation, DM type II, AAA who presents today for overdue follow-up.  [name] was initially followed by Dr. Dickie La and is currently followed by Dr. Clifton James since 2015.  He has a history as noted above for CAD with BMS to LAD due to anterior MI in 2001.  He was diagnosed with atrial fibrillation in 2013 and started on Xarelto with rate control achieved with metoprolol.  He was seen in 2019 for complaint of chest pain and underwent Lexiscan Myoview prior to undergoing and completing surgical procedure.  Results showed intermediate risk with medium defect from previous MI.  He underwent no further testing at that time.  He was diagnosed with a AAA and is currently followed by [facility] with most recent visit in 2023 with CT scan showing 4.7 cm infrarenal abdominal aneurysm.  He was diagnosed with AF with RVR in 06/2019 and went into AF with RVR which required hospitalization.  He was  last seen by Dr. Clifton James on 03/23/2021 and was doing well with no complaints of chest pain.  He was maintaining rate control and blood pressure was well-controlled.   [name] presents today with his wife for 1 year follow-up.  Since last being seen in the office patient reports that he has been doing well with no new cardiac complaints since his previous visit.  During today's visit EKG showed rate atrial fibrillation at 68 bpm with blood pressure controlled at 114/80.  Patient is compliant with his  medication and denies any adverse reactions.  He reports some bruising with Xarelto but denies any inappropriate bleeding.  He is euvolemic on examination today.  He stays very active and works in his yard mowing and picking up sticks and branches.  He lives on 2 acres and walks 2 miles per day.  He follows a mostly heart healthy diet and denies any inappropriate sodium use.  He is scheduled to have his follow-up CT of the abdomen next Thursday in [place].  Patient denies chest pain, palpitations, dyspnea, PND, orthopnea, nausea, vomiting, dizziness, syncope, edema, weight gain, or early satiety.   Home Medications    Current Outpatient Medications  Medication Sig Dispense Refill   acetaminophen (TYLENOL) 500 MG tablet Take 1,000 mg by mouth every 6 (six) hours as needed for mild pain, fever or headache.     ascorbic acid (VITAMIN C) 500 MG tablet Take 1 tablet (500 mg total) by mouth daily. 30 tablet 0   atorvastatin (LIPITOR) 20 MG tablet Take 20 mg by mouth at bedtime.      Continuous Blood Gluc Receiver (FREESTYLE LIBRE 2 READER) DEVI 1 each by Does not apply route daily. 1 each 0   Continuous Blood Gluc Sensor (FREESTYLE LIBRE 2 SENSOR) MISC 1 each by Does not apply route every 14 (fourteen) days. 6 each 3   glipiZIDE (GLUCOTROL) 5 MG tablet TAKE 1 TABLET (5 MG TOTAL) BY MOUTH 2 (TWO) TIMES DAILY BEFORE A MEAL. 180 tablet 3   glucose blood (FREESTYLE PRECISION NEO TEST) test strip Use as instructed to check blood sugar 3 times daily 300 each 3   insulin degludec (TRESIBA FLEXTOUCH) 200 UNIT/ML FlexTouch Pen INJECT 32 UNITS SUBCUTANEOUSLY  DAILY 18 mL 1   insulin lispro (HUMALOG KWIKPEN) 200 UNIT/ML KwikPen INJECT 8 TO 12 UNITS  SUBCUTANEOUSLY 3 TIMES  DAILY BEFORE MEALS 18 mL 3   Insulin Pen Needle (RELION PEN NEEDLES) 32G X 4 MM MISC USE 4 TIMES DAILY WITH  INSULIN 400 each 1   lipase/protease/amylase (CREON) 36000 UNITS CPEP capsule Take 36,000 Units by mouth daily at 6 (six) AM. 2 caps at  every meal, 1 cap at snack time (noon)     metoprolol tartrate (LOPRESSOR) 25 MG tablet Take 0.5 tablets (12.5 mg total) by mouth 2 (two) times daily. PLEASE CONTACT OUR OFFICE TO SCHEDULE AN OVERDUE VISIT WITH DR. Clifton James FOR ANY FUTURE REFILLS. [phone]. THANK YOU. THIRD AND FINAL ATTEMPT. 15 tablet 0   nitroGLYCERIN (NITROSTAT) 0.4 MG SL tablet Place 0.4 mg under the tongue every 5 (five) minutes as needed for chest pain.      omeprazole (PRILOSEC) 20 MG capsule Take 20 mg by mouth daily as needed (for acid reflux/indigestion).      pantoprazole (PROTONIX) 40 MG tablet Take 40 mg by mouth daily.     rivaroxaban (XARELTO) 20 MG TABS tablet TAKE 1 TABLET BY MOUTH EVERY DAY 90 tablet 0   TRULICITY 3 MG/0.5ML SOPN INJECT  THE CONTENTS OF ONE  PEN SUBCUTANEOUSLY WEEKLY  AS DIRECTED 6 mL 3   No current facility-administered medications for this visit.     Review of Systems  Please see the history of present illness.    (+) Bruising (+) Shortness of breath with heavy exertion  All other systems reviewed and are otherwise negative except as noted above.  Physical Exam    Wt Readings from Last 3 Encounters:  09/28/22 247 lb (112 kg)  06/01/22 244 lb (110.7 kg)  04/07/22 247 lb (112 kg)   VS: Vitals:   09/28/22 0921  BP: 114/80  Pulse: 68  SpO2: 98%  ,Body mass index is 33.5 kg/m.  Constitutional:      Appearance: Healthy appearance. Not in distress.  Neck:     Vascular: JVD normal.  Pulmonary:     Effort: Pulmonary effort is normal.     Breath sounds: No wheezing. No rales. Diminished in the bases Cardiovascular:     Irregularly irregular rhythm. Normal S1. Normal S2.      Murmurs: There is no murmur.  Edema:    Peripheral edema absent.  Abdominal:     Palpations: Abdomen is soft non tender. There is no hepatomegaly.  Skin:    General: Skin is warm and dry.  Neurological:     General: No focal deficit present.     Mental Status: Alert and oriented to person, place  and time.     Cranial Nerves: Cranial nerves are intact.  EKG/LABS/ Recent Cardiac Studies    ECG personally reviewed by me today -atrial fibrillation/flutter with rate of 68 bpm and incomplete RBBB with no acute changes consistent with previous EKG.  Cardiac Studies & Procedures     STRESS TESTS  MYOCARDIAL PERFUSION IMAGING 12/21/2017  Narrative  Nuclear stress EF: 50%. There is dyskinesis anteroapical segment.  There was no ST segment deviation noted during stress.  Defect 1: There is a medium defect of moderate severity present in the mid anteroseptal, apical anterior and apical septal location.  Findings consistent with prior myocardial infarction with peri-infarct ischemia.  This is an intermediate risk study.  Donato Schultz, MD   ECHOCARDIOGRAM  ECHOCARDIOGRAM COMPLETE 11/17/2016  Narrative *[facility]* [address] [place], [place] [address] [phone]  ------------------------------------------------------------------- Transthoracic Echocardiography  Patient:    Aubin, Mccullick MR #:       [id number] Study Date: 11/17/2016 Gender:     M Age:        32 Height:     185.4 cm Weight:     110.4 kg BSA:        2.41 m^2 Pt. Status: Room:  SONOGRAPHER  Shawnie Dapper, Christopher REFERRING    Verne Carrow PERFORMING   [facility], Outpatient ATTENDING    Chilton Si, MD  cc:  ------------------------------------------------------------------- LV EF: 50% -   55%  ------------------------------------------------------------------- Indications:      I48.1 Persistent atrial fibrillation I25.10 Coronary artery disease involving native coronary artery.  ------------------------------------------------------------------- History:   PMH:   Atrial fibrillation.  Risk factors:  Diabetes mellitus.  ------------------------------------------------------------------- Study Conclusions  - Left ventricle: The cavity size  was normal. There was moderate concentric hypertrophy. Systolic function was normal. The estimated ejection fraction was in the range of 50% to 55%. Mild hypokinesis of the inferoseptal and apical inferior myocardium. - Aortic valve: Transvalvular velocity was within the normal range. There was no stenosis. There was no regurgitation. - Mitral valve: Transvalvular  velocity was within the normal range. There was no evidence for stenosis. There was no regurgitation. - Left atrium: The atrium was severely dilated. - Right ventricle: The cavity size was normal. Wall thickness was normal. Systolic function was normal. - Right atrium: The atrium was severely dilated. - Atrial septum: No defect or patent foramen ovale was identified by color flow Doppler. - Tricuspid valve: There was mild regurgitation. - Pulmonary arteries: Systolic pressure was within the normal range. PA peak pressure: 35 mm Hg (S).  ------------------------------------------------------------------- Study data:  Comparison was made to the study of 03/02/2012.  Study status:  Routine.  Procedure:  The patient reported no pain pre or post test. Transthoracic echocardiography. Image quality was adequate. The study was technically difficult, as a result of body habitus. Intravenous contrast (Definity) was administered.  Study completion:  There were no complications.          Transthoracic echocardiography.  M-mode, complete 2D, spectral Doppler, and color Doppler.  Birthdate:  Patient birthdate: [birth year].  Age:  Patient is 77 yr old.  Sex:  Gender: male.    BMI: 32.1 kg/m^2.  Blood pressure:     142/97  Patient status:  Outpatient.  Study date: Study date: 11/17/2016. Study time: 02:04 PM.  Location:  [facility]  -------------------------------------------------------------------  ------------------------------------------------------------------- Left ventricle:  The cavity size was normal. There was  moderate concentric hypertrophy. Systolic function was normal. The estimated ejection fraction was in the range of 50% to 55%.  Regional wall motion abnormalities:  Mild hypokinesis of the inferoseptal and apical inferior myocardium. The study was not technically sufficient to allow evaluation of LV diastolic dysfunction due to atrial fibrillation.  ------------------------------------------------------------------- Aortic valve:   Trileaflet; normal thickness leaflets. Mobility was not restricted.  Doppler:  Transvalvular velocity was within the normal range. There was no stenosis. There was no regurgitation.  ------------------------------------------------------------------- Aorta:  Aortic root: The aortic root was normal in size.  ------------------------------------------------------------------- Mitral valve:   Structurally normal valve.   Mobility was not restricted.  Doppler:  Transvalvular velocity was within the normal range. There was no evidence for stenosis. There was no regurgitation.  ------------------------------------------------------------------- Left atrium:  The atrium was severely dilated.  ------------------------------------------------------------------- Atrial septum:  No defect or patent foramen ovale was identified by color flow Doppler.  ------------------------------------------------------------------- Right ventricle:  The cavity size was normal. Wall thickness was normal. Systolic function was normal.  ------------------------------------------------------------------- Pulmonic valve:    Structurally normal valve.   Cusp separation was normal.  Doppler:  Transvalvular velocity was within the normal range. There was no evidence for stenosis. There was no regurgitation.  ------------------------------------------------------------------- Tricuspid valve:   Structurally normal valve.    Doppler: Transvalvular velocity was within the normal  range. There was mild regurgitation.  ------------------------------------------------------------------- Pulmonary artery:   The main pulmonary artery was normal-sized. Systolic pressure was within the normal range.  ------------------------------------------------------------------- Right atrium:  The atrium was severely dilated.  ------------------------------------------------------------------- Pericardium:  There was no pericardial effusion.  ------------------------------------------------------------------- Systemic veins: Inferior vena cava: The vessel was normal in size. The respirophasic diameter changes were blunted (< 50%), consistent with normal central venous pressure.  ------------------------------------------------------------------- Measurements  Left ventricle                         Value        Reference LV ID, ED, PLAX chordal        (L)     42    mm  43 - 52 LV ID, ES, PLAX chordal                30    mm     23 - 38 LV fx shortening, PLAX chordal         29    %      >=29 LV PW thickness, ED                    14    mm     ---------- IVS/LV PW ratio, ED            (H)     1.5          <=1.3  Ventricular septum                     Value        Reference IVS thickness, ED                      21    mm     ----------  LVOT                                   Value        Reference LVOT ID, S                             23    mm     ---------- LVOT area                              4.15  cm^2   ----------  Aorta                                  Value        Reference Aortic root ID, ED                     37    mm     ----------  Left atrium                            Value        Reference LA ID, A-P, ES                         49    mm     ---------- LA ID/bsa, A-P                         2.03  cm/m^2 <=2.2 LA volume, S                           126   ml     ---------- LA volume/bsa, S                       52.2  ml/m^2 ---------- LA volume, ES, 1-p  A4C                 120   ml     ---------- LA volume/bsa, ES, 1-p  A4C             49.7  ml/m^2 ---------- LA volume, ES, 1-p A2C                 133   ml     ---------- LA volume/bsa, ES, 1-p A2C             55.1  ml/m^2 ----------  Pulmonary arteries                     Value        Reference PA pressure, S, DP             (H)     35    mm Hg  <=30  Tricuspid valve                        Value        Reference Tricuspid regurg peak velocity         259   cm/s   ---------- Tricuspid peak RV-RA gradient          27    mm Hg  ----------  Right atrium                           Value        Reference RA ID, S-I, ES, A4C            (H)     69.3  mm     34 - 49 RA area, ES, A4C               (H)     29    cm^2   8.3 - 19.5 RA volume, ES, A/L                     107   ml     ---------- RA volume/bsa, ES, A/L                 44.3  ml/m^2 ----------  Systemic veins                         Value        Reference Estimated CVP                          8     mm Hg  ----------  Right ventricle                        Value        Reference RV pressure, S, DP             (H)     35    mm Hg  <=30  Legend: (L)  and  (H)  mark values outside specified reference range.  ------------------------------------------------------------------- Prepared and Electronically Authenticated by  Chilton Si, MD 2018-07-11T18:39:48             Risk Assessment/Calculations:    CHA2DS2-VASc Score = 5   This indicates a 7.2% annual risk of stroke. The patient's score is based upon: CHF History: 0 HTN History: 1 Diabetes History: 1 Stroke History: 0 Vascular Disease History: 1 Age Score: 2 Gender Score: 0           Lab Results  Component Value Date  WBC 5.6 06/16/2019   HGB 12.2 (L) 06/16/2019   HCT 35.5 (L) 06/16/2019   MCV 98.3 06/16/2019   PLT 142 (L) 06/16/2019   Lab Results  Component Value Date   CREATININE 1.32 (H) 06/16/2019   BUN 29 (H) 06/16/2019   NA 135 06/16/2019   K  4.6 06/16/2019   CL 102 06/16/2019   CO2 19 (L) 06/16/2019   Lab Results  Component Value Date   ALT 44 06/16/2019   AST 42 (H) 06/16/2019   ALKPHOS 50 06/16/2019   BILITOT 0.8 06/16/2019   Lab Results  Component Value Date   CHOL 115 06/14/2019   HDL 35 (L) 06/14/2019   LDLCALC 67 06/14/2019   TRIG 67 06/14/2019   CHOLHDL 3.3 06/14/2019    Lab Results  Component Value Date   HGBA1C 7.0 (A) 06/01/2022     Assessment & Plan    1.  Coronary artery disease: -s/p anterior MI in 2001 treated with BMS x 1 to LAD with repeat heart cath in 2006 showing 80% stenosed diagonal treated medically.  He underwent a Lexiscan Myoview in 2019 -Today patient reports no recurrent chest pain or cardiovascular complaints. -Patient advised to continue GDMT with Lipitor 20 mg daily, Lopressor 12.5 mg twice daily and currently not on ASA 81 mg due to Xarelto.  2.  Persistent atrial fibrillation: -Patient previously diagnosed in 2013 with rate controlled and Xarelto for anticoagulation. -Today patient is in atrial for flutter/fibrillation with controlled rate of 60 bpm. -Patient's creatinine clearance is 58 mL a minute -Continue metoprolol 12.5 mg twice daily and Xarelto 20 mg daily -CHA2DS2-VASc Score = 5 [CHF History: 0, HTN History: 1, Diabetes History: 1, Stroke History: 0, Vascular Disease History: 1, Age Score: 2, Gender Score: 0].  Therefore, the patient's annual risk of stroke is 7.2 %.      3.  Essential hypertension: -Patient's blood pressure today was well-controlled at 114/80 -Continue metoprolol 12.5 mg twice daily  4.  Hyperlipidemia: -Patient's last LDL cholesterol was controlled and at goal of 66 -Continue Lipitor 20 mg daily  5.  History of AAA: -Currently followed by [facility] with most recent CT completed 03/2022 with 4.7 cm infrarenal aneurysm which was previously 4.4 cm in 2018.  He was recommended to have CT/MRI every 6 months and vascular consultation. -Patient is scheduled to  have CT of the abdomen completed next week. -Continue GDMT with Lipitor 20 mg and metoprolol 12.5 mg twice daily.  Disposition: Follow-up with Verne Carrow, MD or APP in 12 months    Medication Adjustments/Labs and Tests Ordered: Current medicines are reviewed at length with the patient today.  Concerns regarding medicines are outlined above.   Signed, Napoleon Form, Leodis Rains, NP 09/28/2022, 10:02 AM [facility]

## 2022-09-28 ENCOUNTER — Encounter: Payer: Self-pay | Admitting: Nurse Practitioner

## 2022-09-28 ENCOUNTER — Ambulatory Visit: Payer: Medicare Other | Attending: Nurse Practitioner | Admitting: Nurse Practitioner

## 2022-09-28 VITALS — BP 114/80 | HR 68 | Ht 72.0 in | Wt 247.0 lb

## 2022-09-28 DIAGNOSIS — I714 Abdominal aortic aneurysm, without rupture, unspecified: Secondary | ICD-10-CM

## 2022-09-28 DIAGNOSIS — I10 Essential (primary) hypertension: Secondary | ICD-10-CM

## 2022-09-28 DIAGNOSIS — I4819 Other persistent atrial fibrillation: Secondary | ICD-10-CM | POA: Diagnosis not present

## 2022-09-28 DIAGNOSIS — I2510 Atherosclerotic heart disease of native coronary artery without angina pectoris: Secondary | ICD-10-CM

## 2022-09-28 DIAGNOSIS — E7800 Pure hypercholesterolemia, unspecified: Secondary | ICD-10-CM | POA: Diagnosis not present

## 2022-09-28 NOTE — Patient Instructions (Signed)
Medication Instructions:  Your physician recommends that you continue on your current medications as directed. Please refer to the Current Medication list given to you today. *If you need a refill on your cardiac medications before your next appointment, please call your pharmacy*   Lab Work: None Ordered   Testing/Procedures: None Ordered   Follow-Up: At [facility], you and your health needs are our priority.  As part of our continuing mission to provide you with exceptional heart care, we have created designated Provider Care Teams.  These Care Teams include your primary Cardiologist (physician) and Advanced Practice Providers (APPs -  Physician Assistants and Nurse Practitioners) who all work together to provide you with the care you need, when you need it.  We recommend signing up for the patient portal called "MyChart".  Sign up information is provided on this After Visit Summary.  MyChart is used to connect with patients for Virtual Visits (Telemedicine).  Patients are able to view lab/test results, encounter notes, upcoming appointments, etc.  Non-urgent messages can be sent to your provider as well.   To learn more about what you can do with MyChart, go to [email or web address].    Your next appointment:   12 month(s)  Provider:   Verne Carrow, MD     Other Instructions

## 2022-10-05 ENCOUNTER — Ambulatory Visit: Payer: Medicare Other | Admitting: Internal Medicine

## 2022-10-05 ENCOUNTER — Encounter: Payer: Self-pay | Admitting: Internal Medicine

## 2022-10-05 VITALS — BP 118/82 | HR 75 | Ht 72.0 in | Wt 241.0 lb

## 2022-10-05 DIAGNOSIS — Z7985 Long-term (current) use of injectable non-insulin antidiabetic drugs: Secondary | ICD-10-CM

## 2022-10-05 DIAGNOSIS — E1165 Type 2 diabetes mellitus with hyperglycemia: Secondary | ICD-10-CM

## 2022-10-05 DIAGNOSIS — Z7984 Long term (current) use of oral hypoglycemic drugs: Secondary | ICD-10-CM | POA: Diagnosis not present

## 2022-10-05 DIAGNOSIS — E1159 Type 2 diabetes mellitus with other circulatory complications: Secondary | ICD-10-CM

## 2022-10-05 DIAGNOSIS — Z794 Long term (current) use of insulin: Secondary | ICD-10-CM

## 2022-10-05 DIAGNOSIS — E663 Overweight: Secondary | ICD-10-CM

## 2022-10-05 DIAGNOSIS — E119 Type 2 diabetes mellitus without complications: Secondary | ICD-10-CM

## 2022-10-05 DIAGNOSIS — E7800 Pure hypercholesterolemia, unspecified: Secondary | ICD-10-CM

## 2022-10-05 LAB — POCT GLYCOSYLATED HEMOGLOBIN (HGB A1C): Hemoglobin A1C: 7.6 % — AB (ref 4.0–5.6)

## 2022-10-05 NOTE — Progress Notes (Unsigned)
Office Note     CC:  follow up Requesting Provider:  Lonie Peak, PA-C  HPI: [name] is a 77 y.o. ([birth year]) male who presents for surveillance follow up of AAA. We have been following his AAA that was last measured at 4.7 cm. He has been without any associated back or abdominal pain.   He is managed on Xarelto for Atrial fibrillation. He is on a statin for hyperlipidemia. He is on BB for hypertension. He is a diabetic.    Past Medical History:  Diagnosis Date   AAA (abdominal aortic aneurysm) (HCC)    Allergy    Atrial fibrillation [facility]) October 2013   Xarelto started; duration unknown   Cataract    Chronic kidney disease    Stage III    Coronary artery disease    post bare-metal stent to LAD in 2001   Diabetes mellitus    type 2    Hypertension    Myocardial infarct [facility]) 2001   Obesity     Past Surgical History:  Procedure Laterality Date   CARDIAC CATHETERIZATION  2006   stable with primarily nonobstructive disease   CARDIOVERSION  03/27/2012   Procedure: CARDIOVERSION;  Surgeon: Luis Abed, MD;  Location: [facility] ENDOSCOPY;  Service: Cardiovascular;  Laterality: N/A;   COLOSTOMY TAKEDOWN N/A 03/08/2018   Procedure: LAPAROSCOPIC HARTMANNS REVERSAL;  Surgeon: Andria Meuse, MD;  Location: [facility] ORS;  Service: General;  Laterality: N/A;   CORONARY ANGIOPLASTY WITH STENT PLACEMENT  2001   bare-metal stent to the LAD   CYSTOSCOPY W/ URETERAL STENT PLACEMENT Bilateral 03/08/2018   Procedure: CYSTOSCOPY WITH RETROGRADE AND BILATERAL URETERAL STENT PLACEMENT;  Surgeon: Crista Elliot, MD;  Location: [facility];  Service: Urology;  Laterality: Bilateral;   CYSTOSCOPY WITH STENT PLACEMENT Bilateral 06/03/2017   Procedure: CYSTOSCOPY WITH STENT PLACEMENT;  Surgeon: Jerilee Field, MD;  Location: [facility];  Service: Urology;  Laterality: Bilateral;   FLEXIBLE SIGMOIDOSCOPY N/A 03/08/2018   Procedure: FLEXIBLE SIGMOIDOSCOPY;  Surgeon: Andria Meuse, MD;   Location: [facility];  Service: General;  Laterality: N/A;   HERNIA REPAIR  2006   LAPAROSCOPIC PARASTOMAL HERNIA N/A 03/08/2018   Procedure: PARASTOMAL HERNIA REPAIR;  Surgeon: Andria Meuse, MD;  Location: [facility];  Service: General;  Laterality: N/A;   LAPAROSCOPIC SIGMOID COLECTOMY N/A 06/03/2017   Procedure: LAPAROSCOPIC  SIGMOID COLECTOMY, COLOSTOMY;  Surgeon: Andria Meuse, MD;  Location: [facility];  Service: General;  Laterality: N/A;   SKIN GRAFT  1981    Social History   Socioeconomic History   Marital status: Married    Spouse name: Not on file   Number of children: Not on file   Years of education: Not on file   Highest education level: Not on file  Occupational History   Not on file  Tobacco Use   Smoking status: Former    Packs/day: 0.50    Years: 40.00    Additional pack years: 0.00    Total pack years: 20.00    Types: Cigarettes, Pipe, Cigars    Quit date: 05/11/1999    Years since quitting: 23.4    Passive exposure: Never   Smokeless tobacco: Never  Vaping Use   Vaping Use: Never used  Substance and Sexual Activity   Alcohol use: No   Drug use: No   Sexual activity: Yes  Other Topics Concern   Not on file  Social History Narrative   Married    Retired: [employer]  3 grown children   Social Determinants of Health   Financial Resource Strain: Not on file  Food Insecurity: Not on file  Transportation Needs: Not on file  Physical Activity: Not on file  Stress: Not on file  Social Connections: Not on file  Intimate Partner Violence: Not on file    Family History  Problem Relation Age of Onset   Heart disease Father    Heart attack Father    ALS Mother    Diabetes Sister    Hypertension Sister    Heart failure Brother    Stroke Neg Hx     Current Outpatient Medications  Medication Sig Dispense Refill   acetaminophen (TYLENOL) 500 MG tablet Take 1,000 mg by mouth every 6 (six) hours as needed for mild pain, fever or headache.     ascorbic  acid (VITAMIN C) 500 MG tablet Take 1 tablet (500 mg total) by mouth daily. 30 tablet 0   atorvastatin (LIPITOR) 20 MG tablet Take 20 mg by mouth at bedtime.      Continuous Blood Gluc Receiver (FREESTYLE LIBRE 2 READER) DEVI 1 each by Does not apply route daily. 1 each 0   Continuous Blood Gluc Sensor (FREESTYLE LIBRE 2 SENSOR) MISC 1 each by Does not apply route every 14 (fourteen) days. 6 each 3   glipiZIDE (GLUCOTROL) 5 MG tablet TAKE 1 TABLET (5 MG TOTAL) BY MOUTH 2 (TWO) TIMES DAILY BEFORE A MEAL. 180 tablet 3   glucose blood (FREESTYLE PRECISION NEO TEST) test strip Use as instructed to check blood sugar 3 times daily 300 each 3   insulin degludec (TRESIBA FLEXTOUCH) 200 UNIT/ML FlexTouch Pen INJECT 32 UNITS SUBCUTANEOUSLY  DAILY 18 mL 1   insulin lispro (HUMALOG KWIKPEN) 200 UNIT/ML KwikPen INJECT 8 TO 12 UNITS  SUBCUTANEOUSLY 3 TIMES  DAILY BEFORE MEALS 18 mL 3   Insulin Pen Needle (RELION PEN NEEDLES) 32G X 4 MM MISC USE 4 TIMES DAILY WITH  INSULIN 400 each 1   lipase/protease/amylase (CREON) 36000 UNITS CPEP capsule Take 36,000 Units by mouth daily at 6 (six) AM. 2 caps at every meal, 1 cap at snack time (noon)     metoprolol tartrate (LOPRESSOR) 25 MG tablet Take 0.5 tablets (12.5 mg total) by mouth 2 (two) times daily. PLEASE CONTACT OUR OFFICE TO SCHEDULE AN OVERDUE VISIT WITH DR. Clifton James FOR ANY FUTURE REFILLS. [phone]. THANK YOU. THIRD AND FINAL ATTEMPT. 15 tablet 0   nitroGLYCERIN (NITROSTAT) 0.4 MG SL tablet Place 0.4 mg under the tongue every 5 (five) minutes as needed for chest pain.      omeprazole (PRILOSEC) 20 MG capsule Take 20 mg by mouth daily as needed (for acid reflux/indigestion).      pantoprazole (PROTONIX) 40 MG tablet Take 40 mg by mouth daily.     rivaroxaban (XARELTO) 20 MG TABS tablet TAKE 1 TABLET BY MOUTH EVERY DAY 90 tablet 0   TRULICITY 3 MG/0.5ML SOPN INJECT THE CONTENTS OF ONE  PEN SUBCUTANEOUSLY WEEKLY  AS DIRECTED 6 mL 3   No current  facility-administered medications for this visit.    No Known Allergies   REVIEW OF SYSTEMS:  *** [X]  denotes positive finding, [ ]  denotes negative finding Cardiac  Comments:  Chest pain or chest pressure:    Shortness of breath upon exertion:    Short of breath when lying flat:    Irregular heart rhythm:        Vascular    Pain in calf, thigh, or hip  brought on by ambulation:    Pain in feet at night that wakes you up from your sleep:     Blood clot in your veins:    Leg swelling:         Pulmonary    Oxygen at home:    Productive cough:     Wheezing:         Neurologic    Sudden weakness in arms or legs:     Sudden numbness in arms or legs:     Sudden onset of difficulty speaking or slurred speech:    Temporary loss of vision in one eye:     Problems with dizziness:         Gastrointestinal    Blood in stool:     Vomited blood:         Genitourinary    Burning when urinating:     Blood in urine:        Psychiatric    Major depression:         Hematologic    Bleeding problems:    Problems with blood clotting too easily:        Skin    Rashes or ulcers:        Constitutional    Fever or chills:      PHYSICAL EXAMINATION:  There were no vitals filed for this visit.  General:  WDWN in NAD; vital signs documented above Gait: Not observed HENT: WNL, normocephalic Pulmonary: normal non-labored breathing , without Rales, rhonchi,  wheezing Cardiac: {Desc; regular/irreg:14544} HR Abdomen: soft, NT, no masses Skin: {With/Without:20273} rashes Vascular Exam/Pulses: *** Extremities: {With/Without:20273} ischemic changes, {With/Without:20273} Gangrene , {With/Without:20273} cellulitis; {With/Without:20273} open wounds;  Musculoskeletal: no muscle wasting or atrophy  Neurologic: A&O X 3*** Psychiatric:  The pt has {Desc; normal/abnormal:11317::"Normal"} affect.   Non-Invasive Vascular Imaging:   ***    ASSESSMENT/PLAN:: 77 y.o. male here for follow  up for ***   -***   Graceann Congress, PA-C [facility] [phone]  Clinic MD:   [facility]/ Randie Heinz

## 2022-10-05 NOTE — Progress Notes (Signed)
Patient ID: BRAEDAN VARIN, male   DOB: Mar 11, 1946, 77 y.o.   MRN: [id number]  HPI: TIMATHY MIXELL is a 77 y.o.-year-old male, returning for f/u for DM2, dx 1995, insulin-dependent, uncontrolled, with complications (CKD stage 3, CAD - AMI, s/p stent LAD 2001). Last visit 4 months ago.  He is here with his wife, who is also my patient, and offers part of the history especially related to his blood sugars, insulin doses, and past medical history.  Interim history: No increased urination, blurry vision, nausea, chest pain.   He was diagnosed with pancreatic insufficiency by Dr. Matthias Hughs.  He continues to have occasional diarrhea.  Reviewed HbA1c levels: Lab Results  Component Value Date   HGBA1C 7.0 (A) 06/01/2022   HGBA1C 7.8 (A) 01/26/2022   HGBA1C 7.0 (A) 09/16/2021   He is on: - Trulicity 1.5  mg weekly >> 3 >> 4.5 >> 3 mg weekly  - Glipizide 5 mg 15-30 min before b'fast and dinner >> 5 mg before b'fast >> 5 mg 2x a day - Tresiba 32 >> 28 units daily - Humalog >> 12-15 before b'fast >> 12 6-10 before lunch >> 0-8 8 >> 4-8 before dinner He was on the insulin 70/30 before admission for COVID-19 + pneumonia in 05/2019.  We stopped this in 12/2020. We tried low-dose metformin ER several times, but he could not tolerate it due to diarrhea >> now diarrhea is better He was on Tanzeum 50 mg weekly (started 09/2015) - >200$ per month! >> Trulicity 1.5 mg weekly He was previously on glipizide-metformin but could not tolerate it due to diarrhea -stopped 08/2017  Pt checks his sugars >4x a day with his freestyle libre 2 CGM:  Previously:  Previously:   Lowest sugar was 47 >> 60s >> 50s >> 60-70s >> 68; he does not have hypoglycemia awareness! Highest sugar was 259 >> 250s >> 178 >> 200. Pt's meals are: - Breakfast: eggs, toast, ham/sausage or cereals + milk - Lunch: sandwich, vegetables, meat - Dinner: seafood, meat, vegetables - Snacks: nabs  Reviewed previous  labs: 06/09/2022: Lipids: 122/50/44/66  Glucose 91, BUN/creatinine 20/1.71, GFR 41 Vitamin B12 299  12/01/2021: Lipids: 130/62/44/73  Glucose 134, BUN/creatinine 22/1.74, GFR 40  05/22/2020: Lipids: 140/50/53/75 Glucose 131, BUN/creatinine 16/1.68, GFR 42  11/01/2019: Lipids: 128/52/49/67 Glucose 143, BUN/creatinine 16/1.48, GFR 46, CMP otherwise normal  04/30/2019: Lipids: 146/55/49/85 CMP normal, except BUN/creatinine 18/1.6, GFR 42.  His glucose was 84. PSA 0.6  04/26/2018: CMP normal with the exception of a slightly high glucose of 124, BUN/creatinine 19/1.5, GFR 46 and slightly higher chloride, of 107. Lipids: 147/58/47/88 CBC with slightly low red blood cells of 4.08 (4.14-5.8), normal hemoglobin of 13.3 and slightly high MCV of 99 (79-97) PSA 0.8  04/13/2017: - Lipids: 124/65/39/72 - CMP normal, glucose 98, BUN/creatinine 10/1.3, GFR 55, potassium 3.4 (3.5-5.2), Ca 8.1 (8.6-10.2) - CBC with differential: low Hb 11.3 (13-17.7), low RBC 3.57 (4.14-5.8) - ferritin 167 - PSA 0.5  09/30/2015: - Hep C virus antibody <0.1 - Lipids: 126/68/44/68 - CMP normal, except glucose 209, BUN/creatinine 22/1.39, GFR 51, potassium 5.4 (3.5-5.2), CO2 17 (18-29) - CBC with differential normal - B12 vitamin 305 (40-981)  04/01/2015: - ACR 56.3 - CBC with differential normal - Lipids: 116/56/42/63 - CMP: Glucose 193, BUN/creatinine 21/1.23, GFR 60, LFTs normal - PSA 0.5    -He has CKD stage III.  -+ HL.  On Lipitor 20.  He is on Neurontin.  - last eye exam was on 06/03/2022:  No DR.   - no numbness and tingling in his feet.  Last foot exam here in clinic was on 01/26/2022.  However, he is also seen in [facility] podiatry, Dr. Theresia Bough and last foot exam was 07/15/2022 -reviewed his exam: Category Sub-Category Detail Notes  5-Neurological Neurological exam: loss of protective sensation via 10-gram monofilament., Soft touch appreciation is intact b/l. , Pinprick test on L4, L5, and S1  dermatomes reveals sharp/dull discrimination is intact b/l., 128 Hz tuning fork test reveals vibratory sensation is diminished b/l.  4-Musculoskeletal Musculoskeletal Exam: Patient exhibits normal range of motion of the subtalar, midtarsal, and first metatarsal phalangeal joint. Decreased range of motion of ankle joint with knee extended as compared with knee flexed. Muscle strength is 5 out of 5 with dorsiflexion, plantarflexion, inversion, and eversion  3-Vascular Vascular exam: Dorsalis pedis and posterior tibial pulses palpable bilaterally. Skin temperature is warm proximally to cool distal bilateral lower extremities. Capillary refill time is less than 3 seconds. There is minimal lower extremity edema noted bilaterally.  2-Dermatological Dermatological Exam: Skin is thin, shiny, and atrophic. No erythema, no purulence, or signs of infections evident to the skin at this time. There is no ecchymosis. There are no skin lesions noted.   SKIN: Annular lesion(s) noted Plantar right first metatarsophalangeal joint   NAILS: Left foot toenail(s) # 1-5 are mycotic , Right foot toenail(s) # 1-5 are mycotic  1-General General Appearance and Constitution: Patient is a pleasant, cooperative adult, well developed, well groomed, and in no acute distress., awake, alert, and oriented to time, place, and person.   Footwear Evaluation: Patient is wearing properly-sized, appropriate footwear, (Athletic shoes).  RFC CLASS FINDINGS CLASS B: Decrease/absence of hair growth, Nail thickening, Thin and shiny skin texture   CLASS C: Edema, Paresthesia, Burning   He has an AAA -increased from 4.4 cm in 2018 to 4.7 cm now (CT scan from 03/25/2022). He has a history of diverticulitis (saw Dr. Matthias Hughs).  He had a colectomy in 05/2017 and had to have a colostomy bag.  He was feeling better after his colectomy.  He had hernia repair surgery and reversal of his ostomy 03/08/2018. He had COVID-19 complicated with pneumonia in  05/2019.  At that time, he was admitted with A. fib with RVR and was treated with remdesivir, convalescent plasma, dexamethasone.   ROS: + see HPI  I reviewed pt's medications, allergies, PMH, social hx, family hx, and changes were documented in the history of present illness. Otherwise, unchanged from my initial visit note.  Past Medical History:  Diagnosis Date   AAA (abdominal aortic aneurysm) (HCC)    Allergy    Atrial fibrillation [facility]) October 2013   Xarelto started; duration unknown   Cataract    Chronic kidney disease    Stage III    Coronary artery disease    post bare-metal stent to LAD in 2001   Diabetes mellitus    type 2    Hypertension    Myocardial infarct [facility]) 2001   Obesity    Past Surgical History:  Procedure Laterality Date   CARDIAC CATHETERIZATION  2006   stable with primarily nonobstructive disease   CARDIOVERSION  03/27/2012   Procedure: CARDIOVERSION;  Surgeon: Luis Abed, MD;  Location: [facility] ENDOSCOPY;  Service: Cardiovascular;  Laterality: N/A;   COLOSTOMY TAKEDOWN N/A 03/08/2018   Procedure: LAPAROSCOPIC HARTMANNS REVERSAL;  Surgeon: Andria Meuse, MD;  Location: [facility];  Service: General;  Laterality: N/A;   CORONARY ANGIOPLASTY WITH STENT PLACEMENT  2001   bare-metal stent to the LAD   CYSTOSCOPY W/ URETERAL STENT PLACEMENT Bilateral 03/08/2018   Procedure: CYSTOSCOPY WITH RETROGRADE AND BILATERAL URETERAL STENT PLACEMENT;  Surgeon: Crista Elliot, MD;  Location: [facility];  Service: Urology;  Laterality: Bilateral;   CYSTOSCOPY WITH STENT PLACEMENT Bilateral 06/03/2017   Procedure: CYSTOSCOPY WITH STENT PLACEMENT;  Surgeon: Jerilee Field, MD;  Location: [facility];  Service: Urology;  Laterality: Bilateral;   FLEXIBLE SIGMOIDOSCOPY N/A 03/08/2018   Procedure: FLEXIBLE SIGMOIDOSCOPY;  Surgeon: Andria Meuse, MD;  Location: [facility];  Service: General;  Laterality: N/A;   HERNIA REPAIR  2006   LAPAROSCOPIC PARASTOMAL HERNIA N/A  03/08/2018   Procedure: PARASTOMAL HERNIA REPAIR;  Surgeon: Andria Meuse, MD;  Location: [facility];  Service: General;  Laterality: N/A;   LAPAROSCOPIC SIGMOID COLECTOMY N/A 06/03/2017   Procedure: LAPAROSCOPIC  SIGMOID COLECTOMY, COLOSTOMY;  Surgeon: Andria Meuse, MD;  Location: [facility];  Service: General;  Laterality: N/A;   SKIN GRAFT  1981   Social History Main Topics   Smoking status: Former Smoker -- 0.50 packs/day for 40 years    Types: Cigarettes, Pipe, Cigars    Quit date: 05/11/1999   Smokeless tobacco: Never Used   Alcohol Use: No   Drug Use: No   Social History Narrative   Married    Retired: [employer]   3 grown children   Current Outpatient Medications on File Prior to Visit  Medication Sig Dispense Refill   acetaminophen (TYLENOL) 500 MG tablet Take 1,000 mg by mouth every 6 (six) hours as needed for mild pain, fever or headache.     ascorbic acid (VITAMIN C) 500 MG tablet Take 1 tablet (500 mg total) by mouth daily. 30 tablet 0   atorvastatin (LIPITOR) 20 MG tablet Take 20 mg by mouth at bedtime.      Continuous Blood Gluc Receiver (FREESTYLE LIBRE 2 READER) DEVI 1 each by Does not apply route daily. 1 each 0   Continuous Blood Gluc Sensor (FREESTYLE LIBRE 2 SENSOR) MISC 1 each by Does not apply route every 14 (fourteen) days. 6 each 3   glipiZIDE (GLUCOTROL) 5 MG tablet TAKE 1 TABLET (5 MG TOTAL) BY MOUTH 2 (TWO) TIMES DAILY BEFORE A MEAL. 180 tablet 3   glucose blood (FREESTYLE PRECISION NEO TEST) test strip Use as instructed to check blood sugar 3 times daily 300 each 3   insulin degludec (TRESIBA FLEXTOUCH) 200 UNIT/ML FlexTouch Pen INJECT 32 UNITS SUBCUTANEOUSLY  DAILY 18 mL 1   insulin lispro (HUMALOG KWIKPEN) 200 UNIT/ML KwikPen INJECT 8 TO 12 UNITS  SUBCUTANEOUSLY 3 TIMES  DAILY BEFORE MEALS 18 mL 3   Insulin Pen Needle (RELION PEN NEEDLES) 32G X 4 MM MISC USE 4 TIMES DAILY WITH  INSULIN 400 each 1   lipase/protease/amylase (CREON) 36000 UNITS CPEP capsule  Take 36,000 Units by mouth daily at 6 (six) AM. 2 caps at every meal, 1 cap at snack time (noon)     metoprolol tartrate (LOPRESSOR) 25 MG tablet Take 0.5 tablets (12.5 mg total) by mouth 2 (two) times daily. PLEASE CONTACT OUR OFFICE TO SCHEDULE AN OVERDUE VISIT WITH DR. Clifton James FOR ANY FUTURE REFILLS. [phone]. THANK YOU. THIRD AND FINAL ATTEMPT. 15 tablet 0   nitroGLYCERIN (NITROSTAT) 0.4 MG SL tablet Place 0.4 mg under the tongue every 5 (five) minutes as needed for chest pain.      omeprazole (PRILOSEC) 20 MG capsule Take 20 mg by mouth daily as needed (  for acid reflux/indigestion).      pantoprazole (PROTONIX) 40 MG tablet Take 40 mg by mouth daily.     rivaroxaban (XARELTO) 20 MG TABS tablet TAKE 1 TABLET BY MOUTH EVERY DAY 90 tablet 0   TRULICITY 3 MG/0.5ML SOPN INJECT THE CONTENTS OF ONE  PEN SUBCUTANEOUSLY WEEKLY  AS DIRECTED 6 mL 3   No current facility-administered medications on file prior to visit.   No Known Allergies Family History  Problem Relation Age of Onset   Heart disease Father    Heart attack Father    ALS Mother    Diabetes Sister    Hypertension Sister    Heart failure Brother    Stroke Neg Hx    PE: BP 118/82 (BP Location: Left Arm, Patient Position: Sitting, Cuff Size: Normal)   Pulse 75   Ht 6' (1.829 m)   Wt 241 lb (109.3 kg)   SpO2 95%   BMI 32.69 kg/m   Wt Readings from Last 3 Encounters:  10/05/22 241 lb (109.3 kg)  09/28/22 247 lb (112 kg)  06/01/22 244 lb (110.7 kg)   Constitutional: overweight, in NAD Eyes: EOMI, no exophthalmos ENT: no thyromegaly, no cervical lymphadenopathy Cardiovascular: Irregularly irregular rhythm, regular rate, no MRG Respiratory: CTA B Musculoskeletal: no deformities Skin: no rashes Neurological: no tremor with outstretched hands  ASSESSMENT: 1. DM2, insulin-dependent, uncontrolled, with complications - CAD - CKD stage 3  2. HL  3.  Overweight  PLAN:  1. Patient with longstanding, previously  uncontrolled type 2 diabetes, with initially improved control after adding a GLP-1 receptor agonist, but afterwards with deteriorating control and weight gain.  However, at last visit, sugars appears to be much better controlled than at previous visit, only occasionally higher after lunch but otherwise with the majority of his blood sugars within the target range.  We did not change his regimen.  HbA1c was 7.0%, lower. CGM interpretation: -At today's visit, we reviewed his CGM downloads: It appears that 66% of values are in target range (goal >70%), while 34% are higher than 180 (goal <25%), and 0% are lower than 70 (goal <4%).  The calculated average blood sugar is 171.  The projected HbA1c for the next 3 months (GMI) is 7.4%. -Reviewing the CGM trends, sugars appear to be well-controlled in the evening and overnight, without lows despite adding back glipizide before dinner.  However, the increase significantly after breakfast and remain elevated only decreasing closer to dinner.  He occasionally eats lunches out and may forget the insulin, however, at today's visit we discussed that he needs to take a higher dose of Humalog before breakfast.  I also advised him to look at breakfast and see which foods may increase his blood sugars and try to reduce them.  Otherwise, we can continue the current regimen. -I advised him to: Patient Instructions  Please continue:  - Glipizide 5 mg 15-30 min before b'fast and 5 mg before dinner - Trulicity 3 mg weekly - Tresiba 28 units daily  Increase: - Humalog  14-16 before b'fast 6-10 before lunch 4-8 before dinner  Please return in 4 months.   - we checked his HbA1c: 7.6% (higher) - advised to check sugars at different times of the day - 4x a day, rotating check times - advised for yearly eye exams >> he is UTD - return to clinic in 4 months  2. HL -Reviewed latest lipid panel from 05/2022: Fractions at goal except for LDL slightly higher than our goal of  less than 55 due to history of cardiovascular disease -He continues Lipitor 20 mg daily without side effects  3.  Overweight -He continues Trulicity 3 mg weekly, tolerated well.  We could not use the 4.5 mg dose as it was not available at his pharmacy -He previously had good results in terms of weight loss with Trulicity, losing 18 pounds after starting it but gaining most of it back afterwards -She lost 3 pounds since last visit  Carlus Pavlov, MD PhD [facility] Endocrinology

## 2022-10-05 NOTE — Patient Instructions (Addendum)
Please continue:  - Glipizide 5 mg 15-30 min before b'fast and 5 mg before dinner - Trulicity 3 mg weekly - Tresiba 28 units daily  Increase: - Humalog  14-16 before b'fast 6-10 before lunch 4-8 before dinner  Please return in 4 months.

## 2022-10-06 ENCOUNTER — Ambulatory Visit (HOSPITAL_COMMUNITY)
Admission: RE | Admit: 2022-10-06 | Discharge: 2022-10-06 | Disposition: A | Discharge status: Home / Self Care | Payer: Medicare Other | Source: Ambulatory Visit | Attending: Vascular Surgery | Admitting: Vascular Surgery

## 2022-10-06 ENCOUNTER — Ambulatory Visit: Payer: Medicare Other | Admitting: Physician Assistant

## 2022-10-06 ENCOUNTER — Encounter: Payer: Self-pay | Admitting: Physician Assistant

## 2022-10-06 VITALS — BP 132/79 | HR 74 | Temp 97.8°F | Wt 243.0 lb

## 2022-10-06 DIAGNOSIS — I7143 Infrarenal abdominal aortic aneurysm, without rupture: Secondary | ICD-10-CM | POA: Insufficient documentation

## 2022-10-12 ENCOUNTER — Other Ambulatory Visit: Payer: Self-pay

## 2022-10-12 DIAGNOSIS — I7143 Infrarenal abdominal aortic aneurysm, without rupture: Secondary | ICD-10-CM

## 2022-10-13 ENCOUNTER — Encounter: Payer: Self-pay | Admitting: Urology

## 2022-10-13 ENCOUNTER — Ambulatory Visit: Payer: Medicare Other | Admitting: Urology

## 2022-10-13 VITALS — BP 153/79 | HR 111 | Ht 72.0 in | Wt 247.0 lb

## 2022-10-13 DIAGNOSIS — R361 Hematospermia: Secondary | ICD-10-CM

## 2022-10-13 DIAGNOSIS — N529 Male erectile dysfunction, unspecified: Secondary | ICD-10-CM

## 2022-10-13 NOTE — Progress Notes (Signed)
I, Duke Salvia, acting as a Neurosurgeon for Riki Altes, MD., have documented all relevant documentation on the behalf of Riki Altes, MD, as directed by  Riki Altes, MD while in the presence of Riki Altes, MD.   10/13/2022 4:59 PM   Gwyndolyn Saxon 1945-06-18 [id number]  Referring provider: Lonie Peak, PA-C [address] [place],  [place] [address]  Chief Complaint  Patient presents with   New Patient (Initial Visit)    HPI: AQIB STOCK is a 77 y.o. male referred for evaluation of hematospermia.  No referring notes were sent. History pbtained from patient and his wife. 2 months ago, he noted blood in his semen, which was described as rust-colored in appearance. No gross hematuria. He apparently had a negative urinalysis and was given empiric antibiotics. He had mild left hemiscrotal pain at the time this occurred. PSA February 2024 was 0.5. UA-he was unable to give a specimen today.   PMH: Past Medical History:  Diagnosis Date   AAA (abdominal aortic aneurysm) (HCC)    Allergy    Atrial fibrillation [facility]) October 2013   Xarelto started; duration unknown   Cataract    Chronic kidney disease    Stage III    Coronary artery disease    post bare-metal stent to LAD in 2001   Diabetes mellitus    type 2    Hypertension    Myocardial infarct [facility]) 2001   Obesity     Surgical History: Past Surgical History:  Procedure Laterality Date   CARDIAC CATHETERIZATION  2006   stable with primarily nonobstructive disease   CARDIOVERSION  03/27/2012   Procedure: CARDIOVERSION;  Surgeon: Luis Abed, MD;  Location: [facility] ENDOSCOPY;  Service: Cardiovascular;  Laterality: N/A;   COLOSTOMY TAKEDOWN N/A 03/08/2018   Procedure: LAPAROSCOPIC HARTMANNS REVERSAL;  Surgeon: Andria Meuse, MD;  Location: [facility]S;  Service: General;  Laterality: N/A;   CORONARY ANGIOPLASTY WITH STENT PLACEMENT  2001   bare-metal stent to the LAD   CYSTOSCOPY W/ URETERAL STENT  PLACEMENT Bilateral 03/08/2018   Procedure: CYSTOSCOPY WITH RETROGRADE AND BILATERAL URETERAL STENT PLACEMENT;  Surgeon: Crista Elliot, MD;  Location: [facility];  Service: Urology;  Laterality: Bilateral;   CYSTOSCOPY WITH STENT PLACEMENT Bilateral 06/03/2017   Procedure: CYSTOSCOPY WITH STENT PLACEMENT;  Surgeon: Jerilee Field, MD;  Location: [facility];  Service: Urology;  Laterality: Bilateral;   FLEXIBLE SIGMOIDOSCOPY N/A 03/08/2018   Procedure: FLEXIBLE SIGMOIDOSCOPY;  Surgeon: Andria Meuse, MD;  Location: [facility];  Service: General;  Laterality: N/A;   HERNIA REPAIR  2006   LAPAROSCOPIC PARASTOMAL HERNIA N/A 03/08/2018   Procedure: PARASTOMAL HERNIA REPAIR;  Surgeon: Andria Meuse, MD;  Location: [facility];  Service: General;  Laterality: N/A;   LAPAROSCOPIC SIGMOID COLECTOMY N/A 06/03/2017   Procedure: LAPAROSCOPIC  SIGMOID COLECTOMY, COLOSTOMY;  Surgeon: Andria Meuse, MD;  Location: [facility];  Service: General;  Laterality: N/A;   SKIN GRAFT  1981    Home Medications:  Allergies as of 10/13/2022   No Known Allergies      Medication List        Accurate as of October 13, 2022  4:59 PM. If you have any questions, ask your nurse or doctor.          acetaminophen 500 MG tablet Commonly known as: TYLENOL Take 1,000 mg by mouth every 6 (six) hours as needed for mild pain, fever or headache.  ascorbic acid 500 MG tablet Commonly known as: VITAMIN C Take 1 tablet (500 mg total) by mouth daily.   atorvastatin 20 MG tablet Commonly known as: LIPITOR Take 20 mg by mouth at bedtime.   Creon 36000 UNITS Cpep capsule Generic drug: lipase/protease/amylase Take 36,000 Units by mouth daily at 6 (six) AM. 2 caps at every meal, 1 cap at snack time (noon)   FreeStyle Libre 2 Reader Bowers 1 each by Does not apply route daily.   FreeStyle Libre 2 Sensor Misc 1 each by Does not apply route every 14 (fourteen) days.   FreeStyle Precision Neo Test test strip Generic  drug: glucose blood Use as instructed to check blood sugar 3 times daily   glipiZIDE 5 MG tablet Commonly known as: GLUCOTROL TAKE 1 TABLET (5 MG TOTAL) BY MOUTH 2 (TWO) TIMES DAILY BEFORE A MEAL.   HumaLOG KwikPen 200 UNIT/ML KwikPen Generic drug: insulin lispro INJECT 8 TO 12 UNITS  SUBCUTANEOUSLY 3 TIMES  DAILY BEFORE MEALS   metoprolol tartrate 25 MG tablet Commonly known as: LOPRESSOR Take 0.5 tablets (12.5 mg total) by mouth 2 (two) times daily. PLEASE CONTACT OUR OFFICE TO SCHEDULE AN OVERDUE VISIT WITH DR. Clifton James FOR ANY FUTURE REFILLS. [phone]. THANK YOU. THIRD AND FINAL ATTEMPT.   nitroGLYCERIN 0.4 MG SL tablet Commonly known as: NITROSTAT Place 0.4 mg under the tongue every 5 (five) minutes as needed for chest pain.   pantoprazole 40 MG tablet Commonly known as: PROTONIX Take 40 mg by mouth daily.   ReliOn Pen Needles 32G X 4 MM Misc Generic drug: Insulin Pen Needle USE 4 TIMES DAILY WITH  INSULIN   Evaristo Bury FlexTouch 200 UNIT/ML FlexTouch Pen Generic drug: insulin degludec INJECT 32 UNITS SUBCUTANEOUSLY  DAILY   Trulicity 3 MG/0.5ML Sopn Generic drug: Dulaglutide INJECT THE CONTENTS OF ONE  PEN SUBCUTANEOUSLY WEEKLY  AS DIRECTED   Xarelto 20 MG Tabs tablet Generic drug: rivaroxaban TAKE 1 TABLET BY MOUTH EVERY DAY        Family History: Family History  Problem Relation Age of Onset   Heart disease Father    Heart attack Father    ALS Mother    Diabetes Sister    Hypertension Sister    Heart failure Brother    Stroke Neg Hx     Social History:  reports that he quit smoking about 23 years ago. His smoking use included cigarettes, pipe, and cigars. He has a 20.00 pack-year smoking history. He has never been exposed to tobacco smoke. He has never used smokeless tobacco. He reports that he does not drink alcohol and does not use drugs.   Physical Exam: BP (!) 153/79   Pulse (!) 111   Ht 6' (1.829 m)   Wt 247 lb (112 kg)   BMI 33.50 kg/m    Constitutional:  Alert and oriented, No acute distress. HEENT: [place] AT, moist mucus membranes.  Trachea midline, no masses. Cardiovascular: No clubbing, cyanosis, or edema. Respiratory: Normal respiratory effort, no increased work of breathing. GI: Abdomen is soft, nontender, nondistended, no abdominal masses GU: Prostate 50 g smooth without nodules. Skin: No rashes, bruises or suspicious lesions. Neurologic: Grossly intact, no focal deficits, moving all 4 extremities. Psychiatric: Normal mood and affect.    Assessment & Plan:    1. Hematospermia We discussed hematospermia is a common condition and most likely secondary to BPH. His DRE is benign and PSA is normal. UA by history was negative, all indicating no significant damage/extremely low chance  of significant pathology. We will request his referring notes/labs. They were instructged to call for recurring hematospermia or gross hematuria.  2. Erectile Dysfunction He also complained of difficulty achieving and maintaining erection. We discussed PDE5 inhibitors. Nitrostat is on his med list. However, he states he has never taken it. He would be a condidate for a PDE5 inhibitor trial. However, he wanted to think over and will call back if he desires an RX. He was instructed he should not take nitroglycerin within 24 hours of taking a PDE5 inihibitor.  [facility] [address], [address] [place], [place] [address] [phone]

## 2022-10-14 ENCOUNTER — Encounter: Payer: Self-pay | Admitting: Urology

## 2022-10-30 ENCOUNTER — Other Ambulatory Visit: Payer: Self-pay | Admitting: Internal Medicine

## 2022-11-05 ENCOUNTER — Other Ambulatory Visit: Payer: Self-pay | Admitting: Internal Medicine

## 2022-11-10 ENCOUNTER — Other Ambulatory Visit: Payer: Self-pay | Admitting: Cardiovascular Disease

## 2022-11-12 ENCOUNTER — Other Ambulatory Visit: Payer: Self-pay | Admitting: Cardiovascular Disease

## 2022-11-12 DIAGNOSIS — I480 Paroxysmal atrial fibrillation: Secondary | ICD-10-CM

## 2022-11-12 NOTE — Telephone Encounter (Signed)
Prescription refill request for Xarelto received.  Indication:AFIB Last office visit:5/24 Weight:112 kg Age:77 Scr:1.71  2/24 CrCl:57.31  ml/min  Prescription refilled

## 2022-11-15 ENCOUNTER — Telehealth: Payer: Self-pay

## 2022-11-15 MED ORDER — SEMAGLUTIDE(0.25 OR 0.5MG/DOS) 2 MG/3ML ~~LOC~~ SOPN: 0.5000 mg | PEN_INJECTOR | SUBCUTANEOUS | 1 refills | Status: DC

## 2022-11-15 NOTE — Telephone Encounter (Signed)
LMTRC  JMiller,RMA 

## 2022-11-15 NOTE — Telephone Encounter (Signed)
We can send a prescription for the 0.5 mg of Ozempic to his pharmacy.  Please try to send 3 pens with 1 refill.

## 2022-11-15 NOTE — Telephone Encounter (Signed)
Pt wife called stating that Trulicity 3mg  is still on Back order, she wants to know can we switch him to something else.

## 2022-11-15 NOTE — Telephone Encounter (Signed)
Ozempic has been sent to [facility] per Wife request

## 2022-11-16 NOTE — Telephone Encounter (Signed)
Pt wife has been notified

## 2023-01-01 ENCOUNTER — Other Ambulatory Visit: Payer: Self-pay | Admitting: Internal Medicine

## 2023-01-16 ENCOUNTER — Other Ambulatory Visit: Payer: Self-pay | Admitting: Internal Medicine

## 2023-01-18 ENCOUNTER — Telehealth: Payer: Self-pay | Admitting: *Deleted

## 2023-01-18 NOTE — Telephone Encounter (Signed)
Pharmacy please advise on holding Xarelto prior to Colonoscopy scheduled for 02/08/2023. Thank you.

## 2023-01-18 NOTE — Telephone Encounter (Signed)
   Pre-operative Risk Assessment    Patient Name: [name]  DOB: [birth year] MRN: [id number]      Request for Surgical Clearance    Procedure:   Colonoscopy  Date of Surgery:  Clearance 02/08/23                                 Surgeon:  Dr. Bernette Redbird Surgeon's Group or Practice Name:  [facility] Phone number:  [phone] Fax number:  [phone]   Type of Clearance Requested:   - Medical  - Pharmacy:  Hold Rivaroxaban (Xarelto) Not Indicated   Type of Anesthesia:   Propofol   Additional requests/questions:    Signed, Emmit Pomfret   01/18/2023, 12:17 PM

## 2023-01-19 ENCOUNTER — Telehealth: Payer: Self-pay | Admitting: *Deleted

## 2023-01-19 NOTE — Telephone Encounter (Signed)
Patient with diagnosis of Afib on Xarelto  for anticoagulation.    Procedure: Colonoscopy  Date of procedure: 02/08/23   CHA2DS2-VASc Score = 5   This indicates a 7.2% annual risk of stroke. The patient's score is based upon: CHF History: 0 HTN History: 1 Diabetes History: 1 Stroke History: 0 Vascular Disease History: 1 Age Score: 2 Gender Score: 0     CrCl 50 mL/min Platelet count 152 (06/09/2022)    Per office protocol, patient can hold Xarelto  for 1-2 days prior to procedure.     **This guidance is not considered finalized until pre-operative APP has relayed final recommendations.**

## 2023-01-19 NOTE — Telephone Encounter (Signed)
   Name: [name]  DOB: 04-May-1946  MRN: [id number]  Primary Cardiologist: Verne Carrow, MD   Preoperative team, please contact this patient and set up a phone call appointment for further preoperative risk assessment. Please obtain consent and complete medication review. Thank you for your help.  I confirm that guidance regarding antiplatelet and oral anticoagulation therapy has been completed and, if necessary, noted below.  Patient with diagnosis of Afib on Xarelto  for anticoagulation.     Procedure: Colonoscopy  Date of procedure: 02/08/23     CHA2DS2-VASc Score = 5   This indicates a 7.2% annual risk of stroke. The patient's score is based upon: CHF History: 0 HTN History: 1 Diabetes History: 1 Stroke History: 0 Vascular Disease History: 1 Age Score: 2 Gender Score: 0       CrCl 50 mL/min Platelet count 152 (06/09/2022)       Per office protocol, patient can hold Xarelto  for 1-2 days prior to procedure.    Ronney Asters, NP 01/19/2023, 9:26 AM [facility]

## 2023-01-19 NOTE — Telephone Encounter (Signed)
S/w both the pt and his wife as the pt is HOH. Pt has been scheduled for tele pre op appt 02/02/23. Med rec and consent are done.

## 2023-01-19 NOTE — Telephone Encounter (Signed)
S/w both the pt and his wife as the pt is HOH. Pt has been scheduled for tele pre op appt 02/02/23. Med rec and consent are done.     Patient Consent for Virtual Visit        TEYO LONGTON has provided verbal consent on 01/19/2023 for a virtual visit (video or telephone).   CONSENT FOR VIRTUAL VISIT FOR:  Gwyndolyn Saxon  By participating in this virtual visit I agree to the following:  I hereby voluntarily request, consent and authorize [facility] and its employed or contracted physicians, physician assistants, nurse practitioners or other licensed health care professionals (the Practitioner), to provide me with telemedicine health care services (the "Services") as deemed necessary by the treating Practitioner. I acknowledge and consent to receive the Services by the Practitioner via telemedicine. I understand that the telemedicine visit will involve communicating with the Practitioner through live audiovisual communication technology and the disclosure of certain medical information by electronic transmission. I acknowledge that I have been given the opportunity to request an in-person assessment or other available alternative prior to the telemedicine visit and am voluntarily participating in the telemedicine visit.  I understand that I have the right to withhold or withdraw my consent to the use of telemedicine in the course of my care at any time, without affecting my right to future care or treatment, and that the Practitioner or I may terminate the telemedicine visit at any time. I understand that I have the right to inspect all information obtained and/or recorded in the course of the telemedicine visit and may receive copies of available information for a reasonable fee.  I understand that some of the potential risks of receiving the Services via telemedicine include:  Delay or interruption in medical evaluation due to technological equipment failure or disruption; Information  transmitted may not be sufficient (e.g. poor resolution of images) to allow for appropriate medical decision making by the Practitioner; and/or  In rare instances, security protocols could fail, causing a breach of personal health information.  Furthermore, I acknowledge that it is my responsibility to provide information about my medical history, conditions and care that is complete and accurate to the best of my ability. I acknowledge that Practitioner's advice, recommendations, and/or decision may be based on factors not within their control, such as incomplete or inaccurate data provided by me or distortions of diagnostic images or specimens that may result from electronic transmissions. I understand that the practice of medicine is not an exact science and that Practitioner makes no warranties or guarantees regarding treatment outcomes. I acknowledge that a copy of this consent can be made available to me via my patient portal [facility] MyChart), or I can request a printed copy by calling the office of [facility].    I understand that my insurance will be billed for this visit.   I have read or had this consent read to me. I understand the contents of this consent, which adequately explains the benefits and risks of the Services being provided via telemedicine.  I have been provided ample opportunity to ask questions regarding this consent and the Services and have had my questions answered to my satisfaction. I give my informed consent for the services to be provided through the use of telemedicine in my medical care

## 2023-02-02 ENCOUNTER — Ambulatory Visit: Payer: Medicare Other | Attending: Cardiology | Admitting: Physician Assistant

## 2023-02-02 VITALS — BP 123/83 | HR 85

## 2023-02-02 DIAGNOSIS — Z01810 Encounter for preprocedural cardiovascular examination: Secondary | ICD-10-CM

## 2023-02-02 NOTE — Progress Notes (Signed)
Virtual Visit via Telephone Note   Because of [name]'s co-morbid illnesses, he is at least at moderate risk for complications without adequate follow up.  This format is felt to be most appropriate for this patient at this time.  The patient did not have access to video technology/had technical difficulties with video requiring transitioning to audio format only (telephone).  All issues noted in this document were discussed and addressed.  No physical exam could be performed with this format.  Please refer to the patient's chart for his consent to telehealth for [facility].  Evaluation Performed:  Preoperative cardiovascular risk assessment _____________   Date:  02/02/2023   Patient ID:  DERLY HODGKISS, DOB August 12, 1945, MRN [id number] Patient Location:  Home Provider location:   Office  Primary Care Provider:  Lonie Peak, PA-C Primary Cardiologist:  Verne Carrow, MD  Chief Complaint / Patient Profile   77 y.o. y/o male with a h/o CAD s/p anterior wall MI 2001 treated with BMS to LAD, residual diagonal stenosis in 2006 managed medically, HTN, HLD, persistent atrial fibrillation managed with rate control strategy, DM, AAA (4.2cm by duplex 09/2022 managed by [facility]) who is pending colonoscopy and presents today for telephonic preoperative cardiovascular risk assessment.  History of Present Illness    TORETTO TOPPING is a 77 y.o. male who presents via audio/video conferencing for a telehealth visit today.  Pt was last seen in cardiology clinic on 09/28/22 by Robin Searing, NP.  At that time MURIEL OHM was doing well. Last stress test 2019 was reviewed by Dr. Clifton James felt to reflect scar but no ischemia, managed medically. The patient is now pending procedure as outlined above. Since his last visit, he reports he has been doing very well. He remains active in the yard, mowing, walking, picking up sticks. No new chest pain or unusual dyspnea. Wife assisted with visit  as well as Mr. Blais is hard of hearing.  Past Medical History    Past Medical History:  Diagnosis Date   AAA (abdominal aortic aneurysm) (HCC)    Allergy    Atrial fibrillation [facility]) October 2013   Xarelto started; duration unknown   Cataract    Chronic kidney disease    Stage III    Coronary artery disease    post bare-metal stent to LAD in 2001   Diabetes mellitus    type 2    Hypertension    Myocardial infarct [facility]) 2001   Obesity    Past Surgical History:  Procedure Laterality Date   CARDIAC CATHETERIZATION  2006   stable with primarily nonobstructive disease   CARDIOVERSION  03/27/2012   Procedure: CARDIOVERSION;  Surgeon: Luis Abed, MD;  Location: [facility] ENDOSCOPY;  Service: Cardiovascular;  Laterality: N/A;   COLOSTOMY TAKEDOWN N/A 03/08/2018   Procedure: LAPAROSCOPIC HARTMANNS REVERSAL;  Surgeon: Andria Meuse, MD;  Location[facility];  Service: General;  Laterality: N/A;   CORONARY ANGIOPLASTY WITH STENT PLACEMENT  2001   bare-metal stent to the LAD   CYSTOSCOPY W/ URETERAL STENT PLACEMENT Bilateral 03/08/2018   Procedure: CYSTOSCOPY WITH RETROGRADE AND BILATERAL URETERAL STENT PLACEMENT;  Surgeon: Crista Elliot, MD;  [facility];  Service: Urology;  Laterality: Bilateral;   CYSTOSCOPY WITH STENT PLACEMENT Bilateral 06/03/2017   Procedure: CYSTOSCOPY WITH STENT PLACEMENT;  Surgeon: Jerilee Field, MD;  Location[facility];  Service: Urology;  Laterality: Bilateral;   FLEXIBLE SIGMOIDOSCOPY N/A 03/08/2018   Procedure: FLEXIBLE SIGMOIDOSCOPY;  Surgeon: Cliffton Asters,  Stephanie Coup, MD;  Location: [facility];  Service: General;  Laterality: N/A;   HERNIA REPAIR  2006   LAPAROSCOPIC PARASTOMAL HERNIA N/A 03/08/2018   Procedure: PARASTOMAL HERNIA REPAIR;  Surgeon: Andria Meuse, MD;  [facility];  Service: General;  Laterality: N/A;   LAPAROSCOPIC SIGMOID COLECTOMY N/A 06/03/2017   Procedure: LAPAROSCOPIC  SIGMOID COLECTOMY, COLOSTOMY;  Surgeon: Andria Meuse, MD;  [facility];  Service: General;  Laterality: N/A;   SKIN GRAFT  1981    Allergies  No Known Allergies  Home Medications    Prior to Admission medications   Medication Sig Start Date End Date Taking? Authorizing Provider  acetaminophen (TYLENOL) 500 MG tablet Take 1,000 mg by mouth every 6 (six) hours as needed for mild pain, fever or headache.    [provider]  ascorbic acid (VITAMIN C) 500 MG tablet Take 1 tablet (500 mg total) by mouth daily. 06/16/19   Drema Dallas, MD  atorvastatin (LIPITOR) 20 MG tablet Take 20 mg by mouth at bedtime.  03/26/14   [provider]  Continuous Blood Gluc Receiver (FREESTYLE LIBRE 2 READER) DEVI 1 each by Does not apply route daily. 01/08/21   Carlus Pavlov, MD  Continuous Blood Gluc Sensor (FREESTYLE LIBRE 2 SENSOR) MISC 1 each by Does not apply route every 14 (fourteen) days. 01/08/21   Carlus Pavlov, MD  glipiZIDE (GLUCOTROL) 5 MG tablet TAKE 1 TABLET (5 MG TOTAL) BY MOUTH TWICE A DAY BEFORE MEALS 01/03/23   Carlus Pavlov, MD  glucose blood (FREESTYLE PRECISION NEO TEST) test strip Use as instructed to check blood sugar 3 times daily 05/19/21   Carlus Pavlov, MD  insulin lispro (HUMALOG KWIKPEN) 200 UNIT/ML KwikPen INJECT 8 TO 12 UNITS  SUBCUTANEOUSLY 3 TIMES  DAILY BEFORE MEALS 04/26/22   Carlus Pavlov, MD  Insulin Pen Needle (RELION PEN NEEDLES) 32G X 4 MM MISC USE  4 TIMES DAILY WITH  INSULIN 10/31/22   Carlus Pavlov, MD  lipase/protease/amylase (CREON) 36000 UNITS CPEP capsule Take 36,000 Units by mouth daily at 6 (six) AM. 2 caps at every meal, 1 cap at snack time (noon)    [provider]  metoprolol tartrate (LOPRESSOR) 25 MG tablet TAKE ONE-HALF TABLET BY MOUTH  TWICE DAILY 11/10/22   Kathleene Hazel, MD  nitroGLYCERIN (NITROSTAT) 0.4 MG SL tablet Place 0.4 mg under the tongue every 5 (five) minutes as needed for chest pain.     [provider]  OZEMPIC, 0.25 OR  0.5 MG/DOSE, 2 MG/3ML SOPN INJECT SUBCUTANEOUSLY 0.5 MG  EVERY WEEK 01/17/23   Carlus Pavlov, MD  pantoprazole (PROTONIX) 40 MG tablet Take 40 mg by mouth daily. 02/16/22   [provider]  rivaroxaban (XARELTO) 20 MG TABS tablet TAKE 1 TABLET BY MOUTH EVERY DAY 11/12/22   Kathleene Hazel, MD  TRESIBA FLEXTOUCH 200 UNIT/ML FlexTouch Pen INJECT 32 UNITS SUBCUTANEOUSLY  DAILY 11/08/22   Carlus Pavlov, MD  TRULICITY 3 MG/0.5ML [facility] INJECT THE CONTENTS OF ONE  PEN SUBCUTANEOUSLY WEEKLY  AS DIRECTED Patient not taking: Reported on 01/19/2023 02/08/22   Carlus Pavlov, MD    Physical Exam    Vital Signs:  BP 123/83   Pulse 85    Given telephonic nature of communication, physical exam is limited. AAOx3. NAD. Normal affect.  Speech and respirations are unlabored.  Accessory Clinical Findings    None  Assessment & Plan    1.  Preoperative Cardiovascular Risk Assessment: RCRI 2 points  indicating 10.1 % 30-day risk of death, MI, or cardiac arrest. The patient affirms he has been doing well without any new cardiac symptoms. They are able to achieve over 4 METS without cardiac limitations. Therefore, based on ACC/AHA guidelines, the patient would be at acceptable risk for the planned procedure without further cardiovascular testing. The patient was advised that if he develops new symptoms prior to surgery to contact our office to arrange for a follow-up visit, and he verbalized understanding.  Per pharmD, per office protocol, patient can hold Xarelto for 1-2 days prior to procedure. Will defer to surgical team to review recommendation and relay final instruction to patient - wife also reports she tried to get on the [facility] portal to download instructions so will need final procedure instructions called to her.  A copy of this note will be routed to requesting surgeon.  Time:   Today, I have spent 7 minutes with the patient with telehealth technology discussing medical history,  symptoms, and management plan.     Laurann Montana, PA-C  02/02/2023, 9:40 AM

## 2023-02-09 ENCOUNTER — Encounter: Payer: Self-pay | Admitting: Internal Medicine

## 2023-02-09 ENCOUNTER — Telehealth: Payer: Self-pay

## 2023-02-09 ENCOUNTER — Ambulatory Visit: Payer: Medicare Other | Admitting: Internal Medicine

## 2023-02-09 VITALS — BP 118/80 | HR 74 | Resp 20 | Ht 72.0 in | Wt 241.8 lb

## 2023-02-09 DIAGNOSIS — Z794 Long term (current) use of insulin: Secondary | ICD-10-CM

## 2023-02-09 DIAGNOSIS — E1159 Type 2 diabetes mellitus with other circulatory complications: Secondary | ICD-10-CM

## 2023-02-09 DIAGNOSIS — E1165 Type 2 diabetes mellitus with hyperglycemia: Secondary | ICD-10-CM | POA: Diagnosis not present

## 2023-02-09 DIAGNOSIS — E7800 Pure hypercholesterolemia, unspecified: Secondary | ICD-10-CM | POA: Diagnosis not present

## 2023-02-09 DIAGNOSIS — Z7984 Long term (current) use of oral hypoglycemic drugs: Secondary | ICD-10-CM | POA: Diagnosis not present

## 2023-02-09 DIAGNOSIS — E663 Overweight: Secondary | ICD-10-CM

## 2023-02-09 DIAGNOSIS — Z7985 Long-term (current) use of injectable non-insulin antidiabetic drugs: Secondary | ICD-10-CM

## 2023-02-09 LAB — POCT GLYCOSYLATED HEMOGLOBIN (HGB A1C): Hemoglobin A1C: 7.3 % — AB (ref 4.0–5.6)

## 2023-02-09 NOTE — Progress Notes (Signed)
Patient ID: [name], male   DOB: 08/28/1945, 77 y.o.   MRN: [id number]  HPI: [name] is a 77 y.o.-year-old male, returning for f/u for DM2, dx 1995, insulin-dependent, uncontrolled, with complications (CKD stage 3, CAD - AMI, s/p stent LAD 2001). Last visit 4 months ago.  He is here with his wife, who is also my patient, and offers part of the history especially related to his blood sugars, insulin doses, and past medical history.  Interim history: No increased urination, blurry vision, nausea, chest pain.   He was diagnosed with pancreatic insufficiency by Dr. Matthias Hughs - on Creon.  He continues to have occasional diarrhea.  He had a colonoscopy yesterday.  Reviewed HbA1c levels: Lab Results  Component Value Date   HGBA1C 7.6 (A) 10/05/2022   HGBA1C 7.0 (A) 06/01/2022   HGBA1C 7.8 (A) 01/26/2022   He is on: - Trulicity 1.5  mg weekly >> 3 >> 4.5 >> 3 mg weekly >> Ozempic 0.5 mg weekly - Glipizide 5 mg 15-30 min before b'fast and dinner >> 5 mg before b'fast >> 5 mg 2x a day - Tresiba 32 >> 28 units daily - Humalog >> 12-15 before b'fast >> 12 >> 14-16 6-10 before lunch >> 0-8 8 >> 4-8 before dinner He was on the insulin 70/30 before admission for COVID-19 + pneumonia in 05/2019.  We stopped this in 12/2020. We tried low-dose metformin ER several times, but he could not tolerate it due to diarrhea >> now diarrhea is better He was on Tanzeum 50 mg weekly (started 09/2015) - >200$ per month! >> Trulicity 1.5 mg weekly He was previously on glipizide-metformin but could not tolerate it due to diarrhea -stopped 08/2017  Pt checks his sugars >4x a day with his freestyle libre 2 CGM:  Previously:  Previously:   Lowest sugar was 47 >> ... 68; he does not have hypoglycemia awareness! Highest sugar was 259 >> ... 200 >> 200s  Pt's meals are: - Breakfast: eggs, toast, ham/sausage or cereals + milk >> frosted flakes with milk 1-2 bowls - Lunch: sandwich, vegetables, meat -  Dinner: seafood, meat, vegetables - Snacks: nabs  Reviewed previous labs: 06/09/2022: Lipids: 122/50/44/66  Glucose 91, BUN/creatinine 20/1.71, GFR 41 Vitamin B12 299  12/01/2021: Lipids: 130/62/44/73  Glucose 134, BUN/creatinine 22/1.74, GFR 40  05/22/2020: Lipids: 140/50/53/75 Glucose 131, BUN/creatinine 16/1.68, GFR 42  11/01/2019: Lipids: 128/52/49/67 Glucose 143, BUN/creatinine 16/1.48, GFR 46, CMP otherwise normal  04/30/2019: Lipids: 146/55/49/85 CMP normal, except BUN/creatinine 18/1.6, GFR 42.  His glucose was 84. PSA 0.6  04/26/2018: CMP normal with the exception of a slightly high glucose of 124, BUN/creatinine 19/1.5, GFR 46 and slightly higher chloride, of 107. Lipids: 147/58/47/88 CBC with slightly low red blood cells of 4.08 (4.14-5.8), normal hemoglobin of 13.3 and slightly high MCV of 99 (79-97) PSA 0.8  04/13/2017: - Lipids: 124/65/39/72 - CMP normal, glucose 98, BUN/creatinine 10/1.3, GFR 55, potassium 3.4 (3.5-5.2), Ca 8.1 (8.6-10.2) - CBC with differential: low Hb 11.3 (13-17.7), low RBC 3.57 (4.14-5.8) - ferritin 167 - PSA 0.5  09/30/2015: - Hep C virus antibody <0.1 - Lipids: 126/68/44/68 - CMP normal, except glucose 209, BUN/creatinine 22/1.39, GFR 51, potassium 5.4 (3.5-5.2), CO2 17 (18-29) - CBC with differential normal - B12 vitamin 305 (20-254)  04/01/2015: - ACR 56.3 - CBC with differential normal - Lipids: 116/56/42/63 - CMP: Glucose 193, BUN/creatinine 21/1.23, GFR 60, LFTs normal - PSA 0.5    -He has CKD stage III.  -+ HL.  On Lipitor 20.  He is on Neurontin.  - last eye exam was on 06/03/2022: No DR.   - no numbness and tingling in his feet.  Last foot exam here in clinic was on 01/26/2022.  However, he is also seen in [facility], Dr. Theresia Bough and last foot exam was 07/15/2022 -reviewed his exam: Category Sub-Category Detail Notes  5-Neurological Neurological exam: loss of protective sensation via 10-gram monofilament., Soft  touch appreciation is intact b/l. , Pinprick test on L4, L5, and S1 dermatomes reveals sharp/dull discrimination is intact b/l., 128 Hz tuning fork test reveals vibratory sensation is diminished b/l.  4-Musculoskeletal Musculoskeletal Exam: Patient exhibits normal range of motion of the subtalar, midtarsal, and first metatarsal phalangeal joint. Decreased range of motion of ankle joint with knee extended as compared with knee flexed. Muscle strength is 5 out of 5 with dorsiflexion, plantarflexion, inversion, and eversion  3-Vascular Vascular exam: Dorsalis pedis and posterior tibial pulses palpable bilaterally. Skin temperature is warm proximally to cool distal bilateral lower extremities. Capillary refill time is less than 3 seconds. There is minimal lower extremity edema noted bilaterally.  2-Dermatological Dermatological Exam: Skin is thin, shiny, and atrophic. No erythema, no purulence, or signs of infections evident to the skin at this time. There is no ecchymosis. There are no skin lesions noted.   SKIN: Annular lesion(s) noted Plantar right first metatarsophalangeal joint   NAILS: Left foot toenail(s) # 1-5 are mycotic , Right foot toenail(s) # 1-5 are mycotic  1-General General Appearance and Constitution: Patient is a pleasant, cooperative adult, well developed, well groomed, and in no acute distress., awake, alert, and oriented to time, place, and person.   Footwear Evaluation: Patient is wearing properly-sized, appropriate footwear, (Athletic shoes).  RFC CLASS FINDINGS CLASS B: Decrease/absence of hair growth, Nail thickening, Thin and shiny skin texture   CLASS C: Edema, Paresthesia, Burning   He has an AAA -increased from 4.4 cm in 2018 to 4.7 cm now (CT scan from 03/25/2022). He has a history of diverticulitis (saw Dr. Matthias Hughs).  He had a colectomy in 05/2017 and had to have a colostomy bag.  He was feeling better after his colectomy.  He had hernia repair surgery and reversal of his  ostomy 03/08/2018. He had COVID-19 complicated with pneumonia in 05/2019.  At that time, he was admitted with A. fib with RVR and was treated with remdesivir, convalescent plasma, dexamethasone.   ROS: + see HPI  I reviewed pt's medications, allergies, PMH, social hx, family hx, and changes were documented in the history of present illness. Otherwise, unchanged from my initial visit note.  Past Medical History:  Diagnosis Date   AAA (abdominal aortic aneurysm) (HCC)    Allergy    Atrial fibrillation [facility]) October 2013   Xarelto started; duration unknown   Cataract    Chronic kidney disease    Stage III    Coronary artery disease    post bare-metal stent to LAD in 2001   Diabetes mellitus    type 2    Hypertension    Myocardial infarct [facility]) 2001   Obesity    Past Surgical History:  Procedure Laterality Date   CARDIAC CATHETERIZATION  2006   stable with primarily nonobstructive disease   CARDIOVERSION  03/27/2012   Procedure: CARDIOVERSION;  Surgeon: Luis Abed, MD;  Location: [facility] ENDOSCOPY;  Service: Cardiovascular;  Laterality: N/A;   COLOSTOMY TAKEDOWN N/A 03/08/2018   Procedure: LAPAROSCOPIC HARTMANNS REVERSAL;  Surgeon: Andria Meuse, MD;  Location: [facility] ORS;  Service: General;  Laterality: N/A;   CORONARY ANGIOPLASTY WITH STENT PLACEMENT  2001   bare-metal stent to the LAD   CYSTOSCOPY W/ URETERAL STENT PLACEMENT Bilateral 03/08/2018   Procedure: CYSTOSCOPY WITH RETROGRADE AND BILATERAL URETERAL STENT PLACEMENT;  Surgeon: Crista Elliot, MD;  Location: [facility] ORS;  Service: Urology;  Laterality: Bilateral;   CYSTOSCOPY WITH STENT PLACEMENT Bilateral 06/03/2017   Procedure: CYSTOSCOPY WITH STENT PLACEMENT;  Surgeon: Jerilee Field, MD;  Location: [facility] ORS;  Service: Urology;  Laterality: Bilateral;   FLEXIBLE SIGMOIDOSCOPY N/A 03/08/2018   Procedure: FLEXIBLE SIGMOIDOSCOPY;  Surgeon: Andria Meuse, MD;  Location: [facility] ORS;  Service: General;  Laterality: N/A;    HERNIA REPAIR  2006   LAPAROSCOPIC PARASTOMAL HERNIA N/A 03/08/2018   Procedure: PARASTOMAL HERNIA REPAIR;  Surgeon: Andria Meuse, MD;  Location: [facility] ORS;  Service: General;  Laterality: N/A;   LAPAROSCOPIC SIGMOID COLECTOMY N/A 06/03/2017   Procedure: LAPAROSCOPIC  SIGMOID COLECTOMY, COLOSTOMY;  Surgeon: Andria Meuse, MD;  Location: [facility] ORS;  Service: General;  Laterality: N/A;   SKIN GRAFT  1981   Social History Main Topics   Smoking status: Former Smoker -- 0.50 packs/day for 40 years    Types: Cigarettes, Pipe, Cigars    Quit date: 05/11/1999   Smokeless tobacco: Never Used   Alcohol Use: No   Drug Use: No   Social History Narrative   Married    Retired: [employer]   3 grown children   Current Outpatient Medications on File Prior to Visit  Medication Sig Dispense Refill   acetaminophen (TYLENOL) 500 MG tablet Take 1,000 mg by mouth every 6 (six) hours as needed for mild pain, fever or headache.     ascorbic acid (VITAMIN C) 500 MG tablet Take 1 tablet (500 mg total) by mouth daily. 30 tablet 0   atorvastatin (LIPITOR) 20 MG tablet Take 20 mg by mouth at bedtime.      Continuous Blood Gluc Receiver (FREESTYLE LIBRE 2 READER) DEVI 1 each by Does not apply route daily. 1 each 0   Continuous Blood Gluc Sensor (FREESTYLE LIBRE 2 SENSOR) MISC 1 each by Does not apply route every 14 (fourteen) days. 6 each 3   glipiZIDE (GLUCOTROL) 5 MG tablet TAKE 1 TABLET (5 MG TOTAL) BY MOUTH TWICE A DAY BEFORE MEALS 180 tablet 1   glucose blood (FREESTYLE PRECISION NEO TEST) test strip Use as instructed to check blood sugar 3 times daily 300 each 3   insulin lispro (HUMALOG KWIKPEN) 200 UNIT/ML KwikPen INJECT 8 TO 12 UNITS  SUBCUTANEOUSLY 3 TIMES  DAILY BEFORE MEALS 18 mL 3   Insulin Pen Needle (RELION PEN NEEDLES) 32G X 4 MM MISC USE  4 TIMES DAILY WITH  INSULIN 400 each 1   lipase/protease/amylase (CREON) 36000 UNITS CPEP capsule Take 36,000 Units by mouth daily at 6 (six) AM. 2 caps at  every meal, 1 cap at snack time (noon)     metoprolol tartrate (LOPRESSOR) 25 MG tablet TAKE ONE-HALF TABLET BY MOUTH  TWICE DAILY 180 tablet 2   nitroGLYCERIN (NITROSTAT) 0.4 MG SL tablet Place 0.4 mg under the tongue every 5 (five) minutes as needed for chest pain.      OZEMPIC, 0.25 OR 0.5 MG/DOSE, 2 MG/3ML SOPN INJECT SUBCUTANEOUSLY 0.5 MG  EVERY WEEK 9 mL 3   pantoprazole (PROTONIX) 40 MG tablet Take 40 mg by mouth daily.     rivaroxaban (XARELTO) 20 MG TABS tablet TAKE 1  TABLET BY MOUTH EVERY DAY 90 tablet 1   TRESIBA FLEXTOUCH 200 UNIT/ML FlexTouch Pen INJECT 32 UNITS SUBCUTANEOUSLY  DAILY 18 mL 3   TRULICITY 3 MG/0.5ML SOPN INJECT THE CONTENTS OF ONE  PEN SUBCUTANEOUSLY WEEKLY  AS DIRECTED (Patient not taking: Reported on 01/19/2023) 6 mL 3   No current facility-administered medications on file prior to visit.   No Known Allergies Family History  Problem Relation Age of Onset   Heart disease Father    Heart attack Father    ALS Mother    Diabetes Sister    Hypertension Sister    Heart failure Brother    Stroke Neg Hx    PE: BP 118/80 (BP Location: Right Arm, Patient Position: Sitting, Cuff Size: Large)   Pulse 74   Resp 20   Ht 6' (1.829 m)   Wt 241 lb 12.8 oz (109.7 kg)   SpO2 97%   BMI 32.79 kg/m   Wt Readings from Last 10 Encounters:  02/09/23 241 lb 12.8 oz (109.7 kg)  10/13/22 247 lb (112 kg)  10/06/22 243 lb (110.2 kg)  10/05/22 241 lb (109.3 kg)  09/28/22 247 lb (112 kg)  06/01/22 244 lb (110.7 kg)  04/07/22 247 lb (112 kg)  03/17/22 247 lb 9.6 oz (112.3 kg)  01/26/22 249 lb 12.8 oz (113.3 kg)  09/16/21 247 lb 6.4 oz (112.2 kg)   Constitutional: overweight, in NAD Eyes: EOMI, no exophthalmos ENT: no thyromegaly, no cervical lymphadenopathy Cardiovascular: Irregularly irregular rhythm, regular rate, no MRG Respiratory: CTA B Musculoskeletal: no deformities Skin: no rashes Neurological: no tremor with outstretched hands  ASSESSMENT: 1. DM2,  insulin-dependent, uncontrolled, with complications - CAD - CKD stage 3  2. HL  3.  Overweight  PLAN:  1. Patient with longstanding, previously uncontrolled type 2 diabetes, with initially improved control after adding a GLP-1 receptor agonist, but afterwards with deteriorating control and weight gain.  At last visit HbA1c was higher, at 7.6%.  Sugars are well-controlled in the evening and overnight, without lows, despite adding back glipizide before dinner.  However, they were increasing significantly after breakfast and remained elevated until closer to dinner.  He occasionally was eating lunches out and was forgetting the insulin but at last visit I also advised him to take a higher dose of Humalog before breakfast.  In 11/2022, due to lack of availability of Trulicity, I recommended Ozempic 0.5 mg weekly. CGM interpretation: -At today's visit, we reviewed his CGM downloads: It appears that 59% of values are in target range (goal >70%), while 31% are higher than 180 (goal <25%), and 0% are lower than 70 (goal <4%).  The calculated average blood sugar is 161.  The projected HbA1c for the next 3 months (GMI) is 7.2%. -Reviewing the CGM trends, sugars appear to be excellent overnight but they increase significantly after breakfast, peaking around 1 to 2 PM, after which they improved.  Upon questioning, he is taking 16 units of Humalog in the morning, however, he is eating a very high carb highly processed breakfast, 1-2 bowls of frosted flakes, for breakfast.  I strongly advised him to stop this and improve breakfast.  Given examples of healthier foods.  For now, I did not suggest a change in regimen. -Given patient assistance program form  to obtain Ozempic from [employer]. -I advised him to: Patient Instructions  Please continue:  - Glipizide 5 mg 15-30 min before b'fast and 5 mg before dinner - Ozempic 0.5 mg weekly - Evaristo Bury  28 units daily - Humalog  14-16 before b'fast 6-10 before  lunch 4-8 before dinner   Try to improve breakfast. Include fiber.    Try to fill out the patient assistant program paperwork for [employer].  Please return in 4 months.   - we checked his HbA1c: 7.3% (lower) - advised to check sugars at different times of the day - 4x a day, rotating check times - advised for yearly eye exams >> he is UTD - return to clinic in 3-4 months  2. HL -Reviewed latest lipid panel from 05/2022: Fractions at goal except for LDL slightly higher than our goal of less than 55 -He continues on Lipitor 20 mg daily without side effects  3.  Overweight -He was not able to continue Trulicity 3 mg weekly due to lack of availability so in 11/2022, we changed to Ozempic 0.5 mg weekly. -he lost 3 pounds before last visit and lost 2 more pounds since then  Carlus Pavlov, MD PhD [facility]

## 2023-02-09 NOTE — Telephone Encounter (Signed)
Samples of this drug (Ozempic) were given to the patient, quantity 2, Lot Numbers [id number], [id number], expiration date 03/09/24 for both samples

## 2023-02-09 NOTE — Patient Instructions (Addendum)
Please continue:  - Glipizide 5 mg 15-30 min before b'fast and 5 mg before dinner - Ozempic 0.5 mg weekly - Tresiba 28 units daily - Humalog  14-16 before b'fast 6-10 before lunch 4-8 before dinner   Try to improve breakfast. Include fiber.    Try to fill out the patient assistant program paperwork for [employer].  Please return in 4 months.

## 2023-02-09 NOTE — Addendum Note (Signed)
Addended by: Tera Partridge on: 02/09/2023 10:49 AM   Modules accepted: Orders

## 2023-03-30 ENCOUNTER — Ambulatory Visit: Payer: Medicare Other | Admitting: Physician Assistant

## 2023-03-30 ENCOUNTER — Other Ambulatory Visit (HOSPITAL_COMMUNITY): Payer: Medicare Other

## 2023-03-30 ENCOUNTER — Ambulatory Visit (HOSPITAL_COMMUNITY)
Admission: RE | Admit: 2023-03-30 | Discharge: 2023-03-30 | Disposition: A | Discharge status: Home / Self Care | Payer: Medicare Other | Source: Ambulatory Visit | Attending: Vascular Surgery | Admitting: Vascular Surgery

## 2023-03-30 ENCOUNTER — Other Ambulatory Visit: Payer: Self-pay | Admitting: Internal Medicine

## 2023-03-30 VITALS — BP 120/82 | HR 79 | Temp 98.7°F | Ht 72.0 in | Wt 240.4 lb

## 2023-03-30 DIAGNOSIS — E1165 Type 2 diabetes mellitus with hyperglycemia: Secondary | ICD-10-CM

## 2023-03-30 DIAGNOSIS — I714 Abdominal aortic aneurysm, without rupture, unspecified: Secondary | ICD-10-CM

## 2023-03-30 DIAGNOSIS — I7143 Infrarenal abdominal aortic aneurysm, without rupture: Secondary | ICD-10-CM | POA: Insufficient documentation

## 2023-03-30 NOTE — Progress Notes (Addendum)
HISTORY AND PHYSICAL     CC:  follow up. Requesting Provider:  Lonie Peak, PA-C  HPI: This is a 77 y.o. male who is here today for follow up for AAA.    Pt was last seen 10/06/2022 and at that time, he was not having any abdominal or back pain, claudication or rest pain or tissue loss.  He was getting 2 miles of steps daily.    The pt returns today for follow up.  He is doing well without complaints.  He denies any claudication or rest pain.  He has not had any abdominal or back pain.   He is on Xarelto for afib.  He is compliant with his statin.  His BP is well controlled.   The pt is on a statin for cholesterol management.    The pt is not on an aspirin.    Other AC:  Xarelto The pt is on BB for hypertension.  The pt is  on medication diabetes. Tobacco hx:  former  Pt does  have family hx of AAA with an uncle.  He has 3 children ages 30-54.  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  Past Medical History:  Diagnosis Date   AAA (abdominal aortic aneurysm) (HCC)    Allergy    Atrial fibrillation [facility]) October 2013   Xarelto started; duration unknown   Cataract    Chronic kidney disease    Stage III    Coronary artery disease    post bare-metal stent to LAD in 2001   Diabetes mellitus    type 2    Hypertension    Myocardial infarct [facility]) 2001   Obesity     Past Surgical History:  Procedure Laterality Date   CARDIAC CATHETERIZATION  2006   stable with primarily nonobstructive disease   CARDIOVERSION  03/27/2012   Procedure: CARDIOVERSION;  Surgeon: Luis Abed, MD;  Location: [facility] ENDOSCOPY;  Service: Cardiovascular;  Laterality: N/A;   COLOSTOMY TAKEDOWN N/A 03/08/2018   Procedure: LAPAROSCOPIC HARTMANNS REVERSAL;  Surgeon: Andria Meuse, MD;  Location: [facility] ORS;  Service: General;  Laterality: N/A;   CORONARY ANGIOPLASTY WITH STENT PLACEMENT  2001   bare-metal stent to the LAD   CYSTOSCOPY W/ URETERAL STENT PLACEMENT Bilateral 03/08/2018   Procedure: CYSTOSCOPY WITH  RETROGRADE AND BILATERAL URETERAL STENT PLACEMENT;  Surgeon: Crista Elliot, MD;  Location: [facility];  Service: Urology;  Laterality: Bilateral;   CYSTOSCOPY WITH STENT PLACEMENT Bilateral 06/03/2017   Procedure: CYSTOSCOPY WITH STENT PLACEMENT;  Surgeon: Jerilee Field, MD;  Location: [facility];  Service: Urology;  Laterality: Bilateral;   FLEXIBLE SIGMOIDOSCOPY N/A 03/08/2018   Procedure: FLEXIBLE SIGMOIDOSCOPY;  Surgeon: Andria Meuse, MD;  Location: [facility];  Service: General;  Laterality: N/A;   HERNIA REPAIR  2006   LAPAROSCOPIC PARASTOMAL HERNIA N/A 03/08/2018   Procedure: PARASTOMAL HERNIA REPAIR;  Surgeon: Andria Meuse, MD;  Location: [facility];  Service: General;  Laterality: N/A;   LAPAROSCOPIC SIGMOID COLECTOMY N/A 06/03/2017   Procedure: LAPAROSCOPIC  SIGMOID COLECTOMY, COLOSTOMY;  Surgeon: Andria Meuse, MD;  Location: [facility];  Service: General;  Laterality: N/A;   SKIN GRAFT  1981    No Known Allergies  Current Outpatient Medications  Medication Sig Dispense Refill   acetaminophen (TYLENOL) 500 MG tablet Take 1,000 mg by mouth every 6 (six) hours as needed for mild pain, fever or headache.     ascorbic acid (VITAMIN C) 500 MG tablet Take 1 tablet (500  mg total) by mouth daily. 30 tablet 0   atorvastatin (LIPITOR) 20 MG tablet Take 20 mg by mouth at bedtime.      Continuous Blood Gluc Receiver (FREESTYLE LIBRE 2 READER) DEVI 1 each by Does not apply route daily. 1 each 0   Continuous Blood Gluc Sensor (FREESTYLE LIBRE 2 SENSOR) MISC 1 each by Does not apply route every 14 (fourteen) days. 6 each 3   glipiZIDE (GLUCOTROL) 5 MG tablet TAKE 1 TABLET (5 MG TOTAL) BY MOUTH TWICE A DAY BEFORE MEALS 180 tablet 1   glucose blood (FREESTYLE PRECISION NEO TEST) test strip Use as instructed to check blood sugar 3 times daily 300 each 3   insulin lispro (HUMALOG KWIKPEN) 200 UNIT/ML KwikPen INJECT 8 TO 12 UNITS  SUBCUTANEOUSLY 3 TIMES  DAILY BEFORE MEALS 18 mL 3   Insulin  Pen Needle (RELION PEN NEEDLES) 32G X 4 MM MISC USE  4 TIMES DAILY WITH  INSULIN 400 each 1   lipase/protease/amylase (CREON) 36000 UNITS CPEP capsule Take 36,000 Units by mouth daily at 6 (six) AM. 2 caps at every meal, 1 cap at snack time (noon)     metoprolol tartrate (LOPRESSOR) 25 MG tablet TAKE ONE-HALF TABLET BY MOUTH  TWICE DAILY 180 tablet 2   nitroGLYCERIN (NITROSTAT) 0.4 MG SL tablet Place 0.4 mg under the tongue every 5 (five) minutes as needed for chest pain.      OZEMPIC, 0.25 OR 0.5 MG/DOSE, 2 MG/3ML SOPN INJECT SUBCUTANEOUSLY 0.5 MG  EVERY WEEK 9 mL 3   pantoprazole (PROTONIX) 40 MG tablet Take 40 mg by mouth daily.     rivaroxaban (XARELTO) 20 MG TABS tablet TAKE 1 TABLET BY MOUTH EVERY DAY 90 tablet 1   TRESIBA FLEXTOUCH 200 UNIT/ML FlexTouch Pen INJECT 32 UNITS SUBCUTANEOUSLY  DAILY 18 mL 3   No current facility-administered medications for this visit.    Family History  Problem Relation Age of Onset   Heart disease Father    Heart attack Father    ALS Mother    Diabetes Sister    Hypertension Sister    Heart failure Brother    Stroke Neg Hx     Social History   Socioeconomic History   Marital status: Married    Spouse name: Not on file   Number of children: Not on file   Years of education: Not on file   Highest education level: Not on file  Occupational History   Not on file  Tobacco Use   Smoking status: Former    Current packs/day: 0.00    Average packs/day: 0.5 packs/day for 40.0 years (20.0 ttl pk-yrs)    Types: Cigarettes, Pipe, Cigars    Start date: 05/11/1959    Quit date: 05/11/1999    Years since quitting: 23.9    Passive exposure: Never   Smokeless tobacco: Never  Vaping Use   Vaping status: Never Used  Substance and Sexual Activity   Alcohol use: No   Drug use: No   Sexual activity: Yes  Other Topics Concern   Not on file  Social History Narrative   Married    Retired: [employer]   3 grown children   Social Determinants of Manufacturing engineer Strain: Not on file  Food Insecurity: Not on file  Transportation Needs: Not on file  Physical Activity: Not on file  Stress: Not on file  Social Connections: Not on file  Intimate Partner Violence: Not on file  REVIEW OF SYSTEMS:   [X]  denotes positive finding, [ ]  denotes negative finding Cardiac  Comments:  Chest pain or chest pressure:    Shortness of breath upon exertion:    Short of breath when lying flat:    Irregular heart rhythm: x       Vascular    Pain in calf, thigh, or hip brought on by ambulation:    Pain in feet at night that wakes you up from your sleep:     Blood clot in your veins:    Leg swelling:         Pulmonary    Oxygen at home:    Productive cough:     Wheezing:         Neurologic    Sudden weakness in arms or legs:     Sudden numbness in arms or legs:     Sudden onset of difficulty speaking or slurred speech:    Temporary loss of vision in one eye:     Problems with dizziness:         Gastrointestinal    Blood in stool:     Vomited blood:         Genitourinary    Burning when urinating:     Blood in urine:        Psychiatric    Major depression:         Hematologic    Bleeding problems:    Problems with blood clotting too easily:        Skin    Rashes or ulcers:        Constitutional    Fever or chills:      PHYSICAL EXAMINATION:  Today's Vitals   03/30/23 0833  BP: 120/82  Pulse: 79  Temp: 98.7 F (37.1 C)  TempSrc: Temporal  SpO2: 95%  Weight: 240 lb 6.4 oz (109 kg)  Height: 6' (1.829 m)   Body mass index is 32.6 kg/m.   General:  WDWN in NAD; vital signs documented above Gait: Not observed HENT: WNL, normocephalic Pulmonary: normal non-labored breathing  Cardiac: irregular HR, without carotid bruits Abdomen: soft, NT; aortic pulse is not palpable Skin: without rashes Vascular Exam/Pulses:  Right Left  Radial 2+ (normal) 2+ (normal)  Popliteal Unable to palpate Unable to palpate   DP 2+ (normal) 2+ (normal)   Extremities: without ischemic changes, without Gangrene , without cellulitis; without open wounds Musculoskeletal: no muscle wasting or atrophy  Neurologic: A&O X 3;  No focal weakness or paresthesias are detected Psychiatric:  The pt has Normal affect.   Non-Invasive Vascular Imaging:   AAA Arterial duplex on 03/30/2023: Abdominal Aorta: There is evidence of abnormal dilatation of the mid Abdominal aorta. The largest aortic measurement is 4.6 cm. The largest aortic diameter has decreased compared to prior exam. Previous diameter measurement was 5.1 cm obtained on  03/17/22.   Previous AAA arterial duplex on 10/06/2022: Abdominal Aorta Findings:  +-----------+-------+----------+----------+---------+--------+--------+  Location  AP (cm)Trans (cm)PSV (cm/s)Waveform ThrombusComments  +-----------+-------+----------+----------+---------+--------+--------+  Proximal  2.55             46                                   +-----------+-------+----------+----------+---------+--------+--------+  Mid       4.21   4.10      70                                   +-----------+-------+----------+----------+---------+--------+--------+  Distal    2.25   2.54      78                                   +-----------+-------+----------+----------+---------+--------+--------+  RT CIA Prox1.3    1.5       134       triphasic                  +-----------+-------+----------+----------+---------+--------+--------+  LT CIA Prox1.6    1.8       105       triphasic                  +-----------+-------+----------+----------+---------+--------+--------+    ASSESSMENT/PLAN:: 78 y.o. male here for follow up for AAA  -pt doing well today without abdominal or back pain.  He does not have claudication or rest pain and his DP pulses are easily palpable.  His BP is well controlled.  -his AAA results are 4.6cm today, which is essentially  unchanged from CT scan at last visit.  Discussed with pt we will bring him back in 6 months for repeat study to make sure the measurements are consistent.   -he did have popliteal duplex in 2019 that did not reveal any evidence of popliteal aneurysms. -discussed that if he develops severe sudden back or abdominal pain, he should call 911 to get to [facility]. -discussed with him that his children should be checked for AAA given familial component that can be associated with this.  He and his wife expressed understanding.   Doreatha Massed, [facility] [phone]  Clinic MD:   Randie Heinz

## 2023-03-30 NOTE — Telephone Encounter (Signed)
Humalog Insulin refill request complete

## 2023-04-05 ENCOUNTER — Other Ambulatory Visit: Payer: Self-pay

## 2023-04-05 DIAGNOSIS — I714 Abdominal aortic aneurysm, without rupture, unspecified: Secondary | ICD-10-CM

## 2023-05-08 ENCOUNTER — Other Ambulatory Visit: Payer: Self-pay | Admitting: Cardiovascular Disease

## 2023-05-08 DIAGNOSIS — I480 Paroxysmal atrial fibrillation: Secondary | ICD-10-CM

## 2023-05-09 NOTE — Telephone Encounter (Signed)
Prescription refill request for Xarelto received.  Indication:afib Last office visit:5/24 Weight:109  kg Age:77 Scr:1.71  2/24 CrCl:55.77  ml/min  Prescription refilled

## 2023-06-15 ENCOUNTER — Encounter: Payer: Self-pay | Admitting: Internal Medicine

## 2023-06-15 ENCOUNTER — Ambulatory Visit: Payer: Medicare Other | Admitting: Internal Medicine

## 2023-06-15 VITALS — BP 120/64 | HR 88 | Ht 72.0 in | Wt 229.8 lb

## 2023-06-15 DIAGNOSIS — E7800 Pure hypercholesterolemia, unspecified: Secondary | ICD-10-CM

## 2023-06-15 DIAGNOSIS — Z7985 Long-term (current) use of injectable non-insulin antidiabetic drugs: Secondary | ICD-10-CM | POA: Diagnosis not present

## 2023-06-15 DIAGNOSIS — Z7984 Long term (current) use of oral hypoglycemic drugs: Secondary | ICD-10-CM

## 2023-06-15 DIAGNOSIS — E663 Overweight: Secondary | ICD-10-CM | POA: Diagnosis not present

## 2023-06-15 DIAGNOSIS — E1165 Type 2 diabetes mellitus with hyperglycemia: Secondary | ICD-10-CM

## 2023-06-15 DIAGNOSIS — Z794 Long term (current) use of insulin: Secondary | ICD-10-CM

## 2023-06-15 DIAGNOSIS — E1159 Type 2 diabetes mellitus with other circulatory complications: Secondary | ICD-10-CM

## 2023-06-15 LAB — POCT GLYCOSYLATED HEMOGLOBIN (HGB A1C): Hemoglobin A1C: 6.6 % — AB (ref 4.0–5.6)

## 2023-06-15 MED ORDER — TRESIBA FLEXTOUCH 200 UNIT/ML ~~LOC~~ SOPN: PEN_INJECTOR | SUBCUTANEOUS | Status: DC

## 2023-06-15 NOTE — Patient Instructions (Addendum)
 Please continue:  - Glipizide  5 mg 15-30 min before b'fast  - Ozempic  0.5 mg weekly - Humalog   14-16 before b'fast 6-10 before lunch 4-8 before dinner   Reduce: - Tresiba  22 units daily  Try to improve breakfast. Include fiber.   Please return in 4 months.

## 2023-06-15 NOTE — Progress Notes (Signed)
 Patient ID: [name], male   DOB: [birth year], 78 y.o.   MRN: [id number]  HPI: [name] is a 78 y.o.-year-old male, returning for f/u for DM2, dx 1995, insulin -dependent, uncontrolled, with complications (CKD stage 3, CAD - AMI, s/p stent LAD 2001). Last visit 4 months ago.  He is here with his wife, who is also my patient, and offers part of the history especially related to his blood sugars, insulin  doses, and past medical history.  Interim history: No increased urination, blurry vision, nausea, chest pain.   He was diagnosed with pancreatic insufficiency by Dr. Donnald - on Creon.  He continues to have occasional diarrhea.  Since last visit, he was more active, with his wife being unable to move much due to her gout. He lost weight and sugars improved.  Reviewed HbA1c levels: Lab Results  Component Value Date   HGBA1C 7.3 (A) 02/09/2023   HGBA1C 7.6 (A) 10/05/2022   HGBA1C 7.0 (A) 06/01/2022   He is on: - Trulicity  1.5  mg weekly >> 3 >> 4.5 >> 3 mg weekly >> Ozempic  0.5 mg weekly - Glipizide  5 mg 15-30 min before b'fast and dinner >> 5 mg before b'fast >> 5 mg 2x a day >> 5 mg before breakfast only - Tresiba  32 >> 28 units daily - Humalog  >> 12-15 before b'fast >> 12 >> 14-16 6-10 before lunch >> 0-8 8 >> 4-8 before dinner He was on the insulin  70/30 before admission for COVID-19 + pneumonia in 05/2019.  We stopped this in 12/2020. We tried low-dose metformin  ER several times, but he could not tolerate it due to diarrhea >> now diarrhea is better He was on Tanzeum  50 mg weekly (started 09/2015) - >200$ per month! >> Trulicity  1.5 mg weekly He was previously on glipizide -metformin  but could not tolerate it due to diarrhea -stopped 08/2017  Pt checks his sugars >4x a day with his freestyle libre 2 CGM:  Previously:  Previously:   Lowest sugar was 47 >> ... 68 >> 60s >> 51; he does not have hypoglycemia awareness! Highest sugar was 259 >> ... 200 >> 200s >>  280s  Pt's meals are: - Breakfast: eggs, toast, ham/sausage or cereals + milk >> still frosted flakes with milk 1-2 bowls; grits - Lunch: sandwich, vegetables, meat - Dinner: seafood, meat, vegetables - Snacks: nabs  Reviewed previous labs: 06/09/2022: Lipids: 122/50/44/66  Glucose 91, BUN/creatinine 20/1.71, GFR 41 Vitamin B12 299  12/01/2021: Lipids: 130/62/44/73  Glucose 134, BUN/creatinine 22/1.74, GFR 40  05/22/2020: Lipids: 140/50/53/75 Glucose 131, BUN/creatinine 16/1.68, GFR 42  -He has CKD stage III.  -+ HL.  On Lipitor 20.  He is on Neurontin .  - last eye exam was on 06/03/2022: No DR.   - no numbness and tingling in his feet. He is also seen [facility]. Last foot exam: 04/14/2023.  He has an AAA -increased from 4.4 cm in 2018 to 4.7 cm now (CT scan from 03/25/2022). He has a history of diverticulitis (saw Dr. Donnald).  He had a colectomy in 05/2017 and had to have a colostomy bag.  He was feeling better after his colectomy.  He had hernia repair surgery and reversal of his ostomy 03/08/2018. He had COVID-19 complicated with pneumonia in 05/2019.  At that time, he was admitted with A. fib with RVR and was treated with remdesivir , convalescent plasma, dexamethasone .   ROS: + see HPI  I reviewed pt's medications, allergies, PMH, social hx, family hx, and changes were  documented in the history of present illness. Otherwise, unchanged from my initial visit note.  Past Medical History:  Diagnosis Date   AAA (abdominal aortic aneurysm) (HCC)    Allergy    Atrial fibrillation [facility]) October 2013   Xarelto  started; duration unknown   Cataract    Chronic kidney disease    Stage III    Coronary artery disease    post bare-metal stent to LAD in 2001   Diabetes mellitus    type 2    Hypertension    Myocardial infarct [facility]) 2001   Obesity    Past Surgical History:  Procedure Laterality Date   CARDIAC CATHETERIZATION  2006   stable with primarily  nonobstructive disease   CARDIOVERSION  03/27/2012   Procedure: CARDIOVERSION;  Surgeon: Reyes JONETTA Forget, MD;  Location: [facility] ENDOSCOPY;  Service: Cardiovascular;  Laterality: N/A;   COLOSTOMY TAKEDOWN N/A 03/08/2018   Procedure: LAPAROSCOPIC HARTMANNS REVERSAL;  Surgeon: Teresa Lonni HERO, MD;  Location: [facility];  Service: General;  Laterality: N/A;   CORONARY ANGIOPLASTY WITH STENT PLACEMENT  2001   bare-metal stent to the LAD   CYSTOSCOPY W/ URETERAL STENT PLACEMENT Bilateral 03/08/2018   Procedure: CYSTOSCOPY WITH RETROGRADE AND BILATERAL URETERAL STENT PLACEMENT;  Surgeon: Carolee Sherwood JONETTA DOUGLAS, MD;  Location: [facility];  Service: Urology;  Laterality: Bilateral;   CYSTOSCOPY WITH STENT PLACEMENT Bilateral 06/03/2017   Procedure: CYSTOSCOPY WITH STENT PLACEMENT;  Surgeon: Nieves Cough, MD;  Location: [facility];  Service: Urology;  Laterality: Bilateral;   FLEXIBLE SIGMOIDOSCOPY N/A 03/08/2018   Procedure: FLEXIBLE SIGMOIDOSCOPY;  Surgeon: Teresa Lonni HERO, MD;  Location: [facility];  Service: General;  Laterality: N/A;   HERNIA REPAIR  2006   LAPAROSCOPIC PARASTOMAL HERNIA N/A 03/08/2018   Procedure: PARASTOMAL HERNIA REPAIR;  Surgeon: Teresa Lonni HERO, MD;  Location: [facility];  Service: General;  Laterality: N/A;   LAPAROSCOPIC SIGMOID COLECTOMY N/A 06/03/2017   Procedure: LAPAROSCOPIC  SIGMOID COLECTOMY, COLOSTOMY;  Surgeon: Teresa Lonni HERO, MD;  Location: [facility];  Service: General;  Laterality: N/A;   SKIN GRAFT  1981   Social History Main Topics   Smoking status: Former Smoker -- 0.50 packs/day for 40 years    Types: Cigarettes, Pipe, Cigars    Quit date: 05/11/1999   Smokeless tobacco: Never Used   Alcohol Use: No   Drug Use: No   Social History Narrative   Married    Retired: [employer]   3 grown children   Current Outpatient Medications on File Prior to Visit  Medication Sig Dispense Refill   acetaminophen  (TYLENOL ) 500 MG tablet Take 1,000 mg by mouth every 6 (six) hours as  needed for mild pain, fever or headache.     ascorbic acid  (VITAMIN C) 500 MG tablet Take 1 tablet (500 mg total) by mouth daily. 30 tablet 0   atorvastatin  (LIPITOR) 20 MG tablet Take 20 mg by mouth at bedtime.      Continuous Blood Gluc Receiver (FREESTYLE LIBRE 2 READER) DEVI 1 each by Does not apply route daily. 1 each 0   Continuous Blood Gluc Sensor (FREESTYLE LIBRE 2 SENSOR) MISC 1 each by Does not apply route every 14 (fourteen) days. 6 each 3   glipiZIDE  (GLUCOTROL ) 5 MG tablet TAKE 1 TABLET (5 MG TOTAL) BY MOUTH TWICE A DAY BEFORE MEALS 180 tablet 1   glucose blood (FREESTYLE PRECISION NEO TEST) test strip Use as instructed to check blood sugar 3 times daily 300 each 3   HUMALOG  [facility]  200 UNIT/ML KwikPen INJECT 8 TO 12 UNITS  SUBCUTANEOUSLY 3 TIMES DAILY  BEFORE MEALS 18 mL 3   Insulin  Pen Needle (RELION PEN NEEDLES) 32G X 4 MM MISC USE  4 TIMES DAILY WITH  INSULIN  400 each 1   lipase/protease/amylase (CREON) 36000 UNITS CPEP capsule Take 36,000 Units by mouth daily at 6 (six) AM. 2 caps at every meal, 1 cap at snack time (noon)     metoprolol  tartrate (LOPRESSOR ) 25 MG tablet TAKE ONE-HALF TABLET BY MOUTH  TWICE DAILY 180 tablet 2   nitroGLYCERIN  (NITROSTAT ) 0.4 MG SL tablet Place 0.4 mg under the tongue every 5 (five) minutes as needed for chest pain.      OZEMPIC , 0.25 OR 0.5 MG/DOSE, 2 MG/3ML SOPN INJECT SUBCUTANEOUSLY 0.5 MG  EVERY WEEK 9 mL 3   pantoprazole  (PROTONIX ) 40 MG tablet Take 40 mg by mouth daily.     TRESIBA  FLEXTOUCH 200 UNIT/ML FlexTouch Pen INJECT 32 UNITS SUBCUTANEOUSLY  DAILY 18 mL 3   XARELTO  20 MG TABS tablet TAKE 1 TABLET BY MOUTH EVERY DAY 90 tablet 1   No current facility-administered medications on file prior to visit.   No Known Allergies Family History  Problem Relation Age of Onset   Heart disease Father    Heart attack Father    ALS Mother    Diabetes Sister    Hypertension Sister    Heart failure Brother    Stroke Neg Hx    PE: BP 120/64    Pulse 88   Ht 6' (1.829 m)   Wt 229 lb 12.8 oz (104.2 kg)   SpO2 95%   BMI 31.17 kg/m   Wt Readings from Last 10 Encounters:  06/15/23 229 lb 12.8 oz (104.2 kg)  03/30/23 240 lb 6.4 oz (109 kg)  02/09/23 241 lb 12.8 oz (109.7 kg)  10/13/22 247 lb (112 kg)  10/06/22 243 lb (110.2 kg)  10/05/22 241 lb (109.3 kg)  09/28/22 247 lb (112 kg)  06/01/22 244 lb (110.7 kg)  04/07/22 247 lb (112 kg)  03/17/22 247 lb 9.6 oz (112.3 kg)   Constitutional: overweight, in NAD Eyes: EOMI, no exophthalmos ENT: no thyromegaly, no cervical lymphadenopathy Cardiovascular: Irregularly irregular rhythm, regular rate, no MRG Respiratory: CTA B Musculoskeletal: no deformities Skin: no rashes Neurological: no tremor with outstretched hands  ASSESSMENT: 1. DM2, insulin -dependent, uncontrolled, with complications - CAD - CKD stage 3  2. HL  3.  Overweight  PLAN:  1. Patient with longstanding, previously on type 2 diabetes, with initially improved control after adding the GLP-1 receptor agonist, with deteriorating control and weight gain.  At last visit, sugars again started to improve and HbA1c was 7.3%, lower.  Sugars were excellent overnight but increasing significantly after breakfast and peaking around 1 to 2 PM, after which they improved again.  Upon questioning, he was still taking 16 units of Humalog  in the morning, however, he was eating a very high carb and highly processed breakfast, 1-2 bowls of frosted flakes and I advised him to stop this and improve breakfast.  I gave him examples of healthier foods.  We did not change the regimen at that time but I did recommend to obtain Ozempic  from [employer] patient assistance program. CGM interpretation: -At today's visit, we reviewed his CGM downloads: It appears that 87%.  Regular of values are in target range (goal >70%), while 12% are higher than 180 (goal <25%), and 1% are lower than 70 (goal <4%).  The calculated  average blood sugar is 135.  The  projected HbA1c for the next 3 months (GMI) is 6.5%. -Reviewing the CGM trends, sugars are fluctuating within the target range with only occasional hyperglycemic exceptions mostly after breakfast and lunch.  Upon questioning, he is still having frosted flakes for breakfast and we discussed about changing this.  He had great his diet but he had problems obtaining his partial denture afterwards. -He does mention that he stopped the glipizide  before the energy to low blood sugars during the night.  Will continue without glipizide  at night. I also advised him to try to reduce the dose of Tresiba  as his sugars are trending low overnight.  Otherwise, we can continue the current regimen. -I advised him to: Patient Instructions  Please continue:  - Glipizide  5 mg 15-30 min before b'fast  - Ozempic  0.5 mg weekly - Humalog   14-16 before b'fast 6-10 before lunch 4-8 before dinner   Reduce: - Tresiba  22 units daily  Try to improve breakfast. Include fiber.   Please return in 4 months.   - we checked his HbA1c: 6.6% (lower) - advised to check sugars at different times of the day - 4x a day, rotating check times - advised for yearly eye exams >> he is due  - return to clinic in 4 months  2. HL - Reviewed latest lipid panel from 05/2022: Fractions at goal except for LDL slightly higher than our goal of less than 55 - Continues the statin (Lipitor 20 mg daily) without side effects.  3.  Overweight -He is currently on Ozempic  0.5 mg weekly, previously Trulicity  3 mg weekly, which she could not continue due to lack of availability at the pharmacy. -At last visit I recommended the patient assistance program for NovoNordisk for Ozempic , but he did not pursue this. -Lost 5 pounds before the last 2 visits combined -At today's visit, he lost approximately 13 pounds since last visit due to increased activity while taking care of his wife.  Lela Fendt, MD PhD [facility]

## 2023-06-27 ENCOUNTER — Telehealth: Payer: Self-pay | Admitting: Internal Medicine

## 2023-06-27 NOTE — Telephone Encounter (Signed)
MEDICATION:  FreeStyle Libre 2 Sensor Continuous Blood Gluc Sensor (FREESTYLE LIBRE 2 SENSOR) MISC  PHARMACY:    [email or web address] - [place], [place] - [phone] (Ph: [phone])    HAS THE PATIENT CONTACTED THEIR PHARMACY?  Yes  IS THIS A 90 DAY SUPPLY : Yes  IS PATIENT OUT OF MEDICATION: No  IF NOT; HOW MUCH IS LEFT: 1  LAST APPOINTMENT DATE: @2 /09/2023  NEXT APPOINTMENT DATE:@6 /13/2025  DO WE HAVE YOUR PERMISSION TO LEAVE A DETAILED MESSAGE?: Yes  OTHER COMMENTS:    **Let patient know to contact pharmacy at the end of the day to make sure medication is ready. **  ** Please notify patient to allow 48-72 hours to process**  **Encourage patient to contact the pharmacy for refills or they can request refills through [facility]**

## 2023-06-29 ENCOUNTER — Other Ambulatory Visit: Payer: Self-pay | Admitting: Internal Medicine

## 2023-06-29 NOTE — Telephone Encounter (Signed)
Order submitted on [employer].

## 2023-09-28 ENCOUNTER — Ambulatory Visit: Payer: Medicare Other

## 2023-09-28 ENCOUNTER — Other Ambulatory Visit (HOSPITAL_COMMUNITY): Payer: Medicare Other

## 2023-10-05 ENCOUNTER — Ambulatory Visit: Payer: Medicare Other

## 2023-10-05 ENCOUNTER — Ambulatory Visit (HOSPITAL_COMMUNITY)
Admission: RE | Admit: 2023-10-05 | Discharge: 2023-10-05 | Disposition: A | Discharge status: Home / Self Care | Payer: Medicare Other | Source: Ambulatory Visit | Attending: Vascular Surgery | Admitting: Vascular Surgery

## 2023-10-05 VITALS — BP 123/80 | HR 89 | Temp 98.2°F | Ht 72.0 in | Wt 230.8 lb

## 2023-10-05 DIAGNOSIS — I714 Abdominal aortic aneurysm, without rupture, unspecified: Secondary | ICD-10-CM | POA: Insufficient documentation

## 2023-10-05 NOTE — Progress Notes (Signed)
 Office Note     CC:  follow up Requesting Provider:  Aloha Arnold, PA-C  HPI: [name] is a 78 y.o. ([birth year]) male who presents for surveillance of abdominal aortic aneurysm.  He denies any new or changing abdominal or back pain.  He is ambulatory with a cane.  He also denies any tissue changes to bilateral lower extremities.  He is on Xarelto  for atrial fibrillation.  He is a former smoker.  He is accompanied today by his wife.   Past Medical History:  Diagnosis Date   AAA (abdominal aortic aneurysm) (HCC)    Allergy    Atrial fibrillation [facility]) October 2013   Xarelto  started; duration unknown   Cataract    Chronic kidney disease    Stage III    Coronary artery disease    post bare-metal stent to LAD in 2001   Diabetes mellitus    type 2    Hypertension    Myocardial infarct [facility]) 2001   Obesity     Past Surgical History:  Procedure Laterality Date   CARDIAC CATHETERIZATION  2006   stable with primarily nonobstructive disease   CARDIOVERSION  03/27/2012   Procedure: CARDIOVERSION;  Surgeon: Deena Farrier, MD;  Location: [facility] ENDOSCOPY;  Service: Cardiovascular;  Laterality: N/A;   COLOSTOMY TAKEDOWN N/A 03/08/2018   Procedure: LAPAROSCOPIC HARTMANNS REVERSAL;  Surgeon: Melvenia Stabs, MD;  Location: [facility] ORS;  Service: General;  Laterality: N/A;   CORONARY ANGIOPLASTY WITH STENT PLACEMENT  2001   bare-metal stent to the LAD   CYSTOSCOPY W/ URETERAL STENT PLACEMENT Bilateral 03/08/2018   Procedure: CYSTOSCOPY WITH RETROGRADE AND BILATERAL URETERAL STENT PLACEMENT;  Surgeon: Samson Croak, MD;  Location: [facility] ORS;  Service: Urology;  Laterality: Bilateral;   CYSTOSCOPY WITH STENT PLACEMENT Bilateral 06/03/2017   Procedure: CYSTOSCOPY WITH STENT PLACEMENT;  Surgeon: Christina Coyer, MD;  Location: [facility]S;  Service: Urology;  Laterality: Bilateral;   FLEXIBLE SIGMOIDOSCOPY N/A 03/08/2018   Procedure: FLEXIBLE SIGMOIDOSCOPY;  Surgeon: Melvenia Stabs,  MD;  Location: [facility]S;  Service: General;  Laterality: N/A;   HERNIA REPAIR  2006   LAPAROSCOPIC PARASTOMAL HERNIA N/A 03/08/2018   Procedure: PARASTOMAL HERNIA REPAIR;  Surgeon: Melvenia Stabs, MD;  Location: [facility];  Service: General;  Laterality: N/A;   LAPAROSCOPIC SIGMOID COLECTOMY N/A 06/03/2017   Procedure: LAPAROSCOPIC  SIGMOID COLECTOMY, COLOSTOMY;  Surgeon: Melvenia Stabs, MD;  Location: [facility];  Service: General;  Laterality: N/A;   SKIN GRAFT  1981    Social History   Socioeconomic History   Marital status: Married    Spouse name: Not on file   Number of children: Not on file   Years of education: Not on file   Highest education level: Not on file  Occupational History   Not on file  Tobacco Use   Smoking status: Former    Current packs/day: 0.00    Average packs/day: 0.5 packs/day for 40.0 years (20.0 ttl pk-yrs)    Types: Cigarettes, Pipe, Cigars    Start date: 05/11/1959    Quit date: 05/11/1999    Years since quitting: 24.4    Passive exposure: Never   Smokeless tobacco: Never  Vaping Use   Vaping status: Never Used  Substance and Sexual Activity   Alcohol use: No   Drug use: No   Sexual activity: Yes  Other Topics Concern   Not on file  Social History Narrative   Married    Retired: [employer]  3 grown children   Social Drivers of Corporate investment banker Strain: Not on file  Food Insecurity: Not on file  Transportation Needs: Not on file  Physical Activity: Not on file  Stress: Not on file  Social Connections: Not on file  Intimate Partner Violence: Not on file    Family History  Problem Relation Age of Onset   Heart disease Father    Heart attack Father    ALS Mother    Diabetes Sister    Hypertension Sister    Heart failure Brother    Stroke Neg Hx     Current Outpatient Medications  Medication Sig Dispense Refill   acetaminophen  (TYLENOL ) 500 MG tablet Take 1,000 mg by mouth every 6 (six) hours as needed for mild pain, fever  or headache.     ascorbic acid  (VITAMIN C) 500 MG tablet Take 1 tablet (500 mg total) by mouth daily. 30 tablet 0   atorvastatin  (LIPITOR) 20 MG tablet Take 20 mg by mouth at bedtime.      Continuous Blood Gluc Receiver (FREESTYLE LIBRE 2 READER) DEVI 1 each by Does not apply route daily. 1 each 0   Continuous Blood Gluc Sensor (FREESTYLE LIBRE 2 SENSOR) MISC 1 each by Does not apply route every 14 (fourteen) days. 6 each 3   glipiZIDE  (GLUCOTROL ) 5 MG tablet TAKE 1 TABLET (5 MG TOTAL) BY MOUTH TWICE A DAY BEFORE MEALS 180 tablet 1   glucose blood (FREESTYLE PRECISION NEO TEST) test strip Use as instructed to check blood sugar 3 times daily 300 each 3   HUMALOG  KWIKPEN 200 UNIT/ML KwikPen INJECT 8 TO 12 UNITS  SUBCUTANEOUSLY 3 TIMES DAILY  BEFORE MEALS 18 mL 3   insulin  degludec (TRESIBA  FLEXTOUCH) 200 UNIT/ML FlexTouch Pen INJECT 22-24 UNITS  SUBCUTANEOUSLY DAILY     Insulin  Pen Needle (RELION PEN NEEDLES) 32G X 4 MM MISC USE  4 TIMES DAILY WITH  INSULIN  400 each 1   lipase/protease/amylase (CREON) 36000 UNITS CPEP capsule Take 36,000 Units by mouth daily at 6 (six) AM. 2 caps at every meal, 1 cap at snack time (noon)     metoprolol  tartrate (LOPRESSOR ) 25 MG tablet TAKE ONE-HALF TABLET BY MOUTH  TWICE DAILY 180 tablet 2   nitroGLYCERIN  (NITROSTAT ) 0.4 MG SL tablet Place 0.4 mg under the tongue every 5 (five) minutes as needed for chest pain.      OZEMPIC , 0.25 OR 0.5 MG/DOSE, 2 MG/3ML SOPN INJECT SUBCUTANEOUSLY 0.5 MG  EVERY WEEK 9 mL 3   pantoprazole  (PROTONIX ) 40 MG tablet Take 40 mg by mouth daily.     XARELTO  20 MG TABS tablet TAKE 1 TABLET BY MOUTH EVERY DAY 90 tablet 1   No current facility-administered medications for this visit.    No Known Allergies   REVIEW OF SYSTEMS:   [X]  denotes positive finding, [ ]  denotes negative finding Cardiac  Comments:  Chest pain or chest pressure:    Shortness of breath upon exertion:    Short of breath when lying flat:    Irregular heart  rhythm:        Vascular    Pain in calf, thigh, or hip brought on by ambulation:    Pain in feet at night that wakes you up from your sleep:     Blood clot in your veins:    Leg swelling:         Pulmonary    Oxygen at home:    Productive cough:  Wheezing:         Neurologic    Sudden weakness in arms or legs:     Sudden numbness in arms or legs:     Sudden onset of difficulty speaking or slurred speech:    Temporary loss of vision in one eye:     Problems with dizziness:         Gastrointestinal    Blood in stool:     Vomited blood:         Genitourinary    Burning when urinating:     Blood in urine:        Psychiatric    Major depression:         Hematologic    Bleeding problems:    Problems with blood clotting too easily:        Skin    Rashes or ulcers:        Constitutional    Fever or chills:      PHYSICAL EXAMINATION:  Vitals:   10/05/23 0840  BP: 123/80  Pulse: 89  Temp: 98.2 F (36.8 C)  TempSrc: Temporal  SpO2: 95%  Weight: 230 lb 12.8 oz (104.7 kg)  Height: 6' (1.829 m)    General:  WDWN in NAD; vital signs documented above Gait: Not observed HENT: WNL, normocephalic Pulmonary: normal non-labored breathing  Cardiac: regular HR Abdomen: soft, NT, no masses Skin: without rashes Extremities: without ischemic changes, without Gangrene , without cellulitis; without open wounds;  Musculoskeletal: no muscle wasting or atrophy  Neurologic: A&O X 3 Psychiatric:  The pt has Normal affect.   Non-Invasive Vascular Imaging:   4.6cm AAA    ASSESSMENT/PLAN:: 78 y.o. male here for follow up for surveillance of abdominal aortic aneurysm  Mr. Castagna is subjectively doing well.  He does not have any new or changing abdominal or back pain.  AAA duplex demonstrates a stable AAA measuring 4.6 cm.  We will repeat duplex in 1 year.  He will follow regularly with his PCP for management of chronic medical conditions including hypertension and  hyperlipidemia.  He will notify the office with any questions or concerns.   Cordie Deters, PA-C [facility] [phone]  Clinic MD:   Vikki Graves

## 2023-10-21 ENCOUNTER — Ambulatory Visit: Payer: Medicare Other | Admitting: Internal Medicine

## 2023-10-21 ENCOUNTER — Encounter: Payer: Self-pay | Admitting: Internal Medicine

## 2023-10-21 VITALS — BP 112/60 | HR 73 | Ht 72.0 in | Wt 230.0 lb

## 2023-10-21 DIAGNOSIS — E1165 Type 2 diabetes mellitus with hyperglycemia: Secondary | ICD-10-CM | POA: Diagnosis not present

## 2023-10-21 DIAGNOSIS — E1159 Type 2 diabetes mellitus with other circulatory complications: Secondary | ICD-10-CM

## 2023-10-21 DIAGNOSIS — Z7985 Long-term (current) use of injectable non-insulin antidiabetic drugs: Secondary | ICD-10-CM

## 2023-10-21 DIAGNOSIS — E7800 Pure hypercholesterolemia, unspecified: Secondary | ICD-10-CM | POA: Diagnosis not present

## 2023-10-21 DIAGNOSIS — Z794 Long term (current) use of insulin: Secondary | ICD-10-CM

## 2023-10-21 DIAGNOSIS — E663 Overweight: Secondary | ICD-10-CM

## 2023-10-21 DIAGNOSIS — Z7984 Long term (current) use of oral hypoglycemic drugs: Secondary | ICD-10-CM

## 2023-10-21 LAB — POCT GLYCOSYLATED HEMOGLOBIN (HGB A1C): Hemoglobin A1C: 6.8 % — AB (ref 4.0–5.6)

## 2023-10-21 MED ORDER — TRESIBA FLEXTOUCH 200 UNIT/ML ~~LOC~~ SOPN: PEN_INJECTOR | SUBCUTANEOUS | Status: DC

## 2023-10-21 NOTE — Patient Instructions (Addendum)
 Please change: - Glipizide  5 mg 15-30 min before b'fast  - Ozempic  0.5 mg weekly - Humalog  15 min before meals 14-16 before b'fast 6-10 before lunch 4 before a large dinner  - Tresiba  18 units daily  Please return in 4 months.

## 2023-10-21 NOTE — Progress Notes (Signed)
 Patient ID: [name], male   DOB: 1946-02-16, 78 y.o.   MRN: [id number]  HPI: [name] is a 78 y.o.-year-old male, returning for f/u for DM2, dx 1995, insulin -dependent, uncontrolled, with complications (CKD stage 3, CAD - AMI, s/p stent LAD 2001). Last visit 4 months ago.  He is here with his wife, who is also my patient, and offers part of the history especially related to his blood sugars, insulin  doses, and past medical history.  Interim history: No increased urination, blurry vision, nausea, chest pain.   He was diagnosed with pancreatic insufficiency by Dr. Dellis Fermo - on Creon.  He continues to have occasional diarrhea.  Before last visit, he was more active, with his wife being unable to move much due to her gout. He had lost weight and sugars improved.  Reviewed HbA1c levels: Lab Results  Component Value Date   HGBA1C 6.6 (A) 06/15/2023   HGBA1C 7.3 (A) 02/09/2023   HGBA1C 7.6 (A) 10/05/2022   He is on: - Trulicity  1.5  mg weekly >> 3 >> 4.5 >> 3 mg weekly >> Ozempic  0.5 mg weekly - Glipizide  5 mg 15-30 min before b'fast and dinner >> 5 mg before b'fast >> 5 mg 2x a day >> 5 mg before breakfast only - Tresiba  32 >> 28 >> 22 units daily - Humalog  >> 12-15 before b'fast >> 12 >> 14-16 6-10 before lunch >> 0-8 >> 6-10 (but not taking it when eating out 8 >> 4-8 >> 4-6 before dinner He was on the insulin  70/30 before admission for COVID-19 + pneumonia in 05/2019.  We stopped this in 12/2020. We tried low-dose metformin  ER several times, but he could not tolerate it due to diarrhea >> now diarrhea is better He was on Tanzeum  50 mg weekly (started 09/2015) - >200$ per month! >> Trulicity  1.5 mg weekly He was previously on glipizide -metformin  but could not tolerate it due to diarrhea -stopped 08/2017  Pt checks his sugars >4x a day with his freestyle libre 2 CGM:  Previously:  Previously:   Lowest sugar was 47 >> ... 68 >> 60s >> 51 >> 55; he does not have hypoglycemia  awareness! Highest sugar was 259 >> ... 200 >> 200s >> 280s >> 200s  Pt's meals are: - Breakfast: eggs, toast, ham/sausage or cereals + milk >> still frosted flakes with milk 1-2 bowls; grits - Lunch: sandwich, vegetables, meat - Dinner: seafood, meat, vegetables - Snacks: nabs  Reviewed previous labs: 06/14/2023:  Lipids: 148/53/58/78 Glucose 82, BUN/creatinine 19/1.58, GFR 45  06/09/2022: Lipids: 122/50/44/66  Glucose 91, BUN/creatinine 20/1.71, GFR 41 Vitamin B12 299  12/01/2021: Lipids: 130/62/44/73  Glucose 134, BUN/creatinine 22/1.74, GFR 40  05/22/2020: Lipids: 140/50/53/75 Glucose 131, BUN/creatinine 16/1.68, GFR 42  -He has CKD stage III.  -+ HL.  On Lipitor 20.  He is on Neurontin .  - last eye exam was on 2025: No DR.   - no numbness and tingling in his feet. He is also seen [facility]. Last foot exam: By Dr. Chanetta Comes ([facility]) on 10/18/2023.  He has an AAA -increased from 4.4 cm in 2018 to 4.7 cm now (CT scan from 03/25/2022). He has a history of diverticulitis (saw Dr. Dellis Fermo).  He had a colectomy in 05/2017 and had to have a colostomy bag.  He was feeling better after his colectomy.  He had hernia repair surgery and reversal of his ostomy 03/08/2018. He had COVID-19 complicated with pneumonia in 05/2019.  At that time,  he was admitted with A. fib with RVR and was treated with remdesivir , convalescent plasma, dexamethasone .   ROS: + see HPI  I reviewed pt's medications, allergies, PMH, social hx, family hx, and changes were documented in the history of present illness. Otherwise, unchanged from my initial visit note.  Past Medical History:  Diagnosis Date   AAA (abdominal aortic aneurysm) (HCC)    Allergy    Atrial fibrillation [facility]) October 2013   Xarelto  started; duration unknown   Cataract    Chronic kidney disease    Stage III    Coronary artery disease    post bare-metal stent to LAD in 2001   Diabetes mellitus    type 2     Hypertension    Myocardial infarct [facility]) 2001   Obesity    Past Surgical History:  Procedure Laterality Date   CARDIAC CATHETERIZATION  2006   stable with primarily nonobstructive disease   CARDIOVERSION  03/27/2012   Procedure: CARDIOVERSION;  Surgeon: Deena Farrier, MD;  Location: [facility] ENDOSCOPY;  Service: Cardiovascular;  Laterality: N/A;   COLOSTOMY TAKEDOWN N/A 03/08/2018   Procedure: LAPAROSCOPIC HARTMANNS REVERSAL;  Surgeon: Melvenia Stabs, MD;  Location: [facility];  Service: General;  Laterality: N/A;   CORONARY ANGIOPLASTY WITH STENT PLACEMENT  2001   bare-metal stent to the LAD   CYSTOSCOPY W/ URETERAL STENT PLACEMENT Bilateral 03/08/2018   Procedure: CYSTOSCOPY WITH RETROGRADE AND BILATERAL URETERAL STENT PLACEMENT;  Surgeon: Samson Croak, MD;  Location: [facility];  Service: Urology;  Laterality: Bilateral;   CYSTOSCOPY WITH STENT PLACEMENT Bilateral 06/03/2017   Procedure: CYSTOSCOPY WITH STENT PLACEMENT;  Surgeon: Christina Coyer, MD;  Location: [facility];  Service: Urology;  Laterality: Bilateral;   FLEXIBLE SIGMOIDOSCOPY N/A 03/08/2018   Procedure: FLEXIBLE SIGMOIDOSCOPY;  Surgeon: Melvenia Stabs, MD;  Location: [facility];  Service: General;  Laterality: N/A;   HERNIA REPAIR  2006   LAPAROSCOPIC PARASTOMAL HERNIA N/A 03/08/2018   Procedure: PARASTOMAL HERNIA REPAIR;  Surgeon: Melvenia Stabs, MD;  Location: [facility];  Service: General;  Laterality: N/A;   LAPAROSCOPIC SIGMOID COLECTOMY N/A 06/03/2017   Procedure: LAPAROSCOPIC  SIGMOID COLECTOMY, COLOSTOMY;  Surgeon: Melvenia Stabs, MD;  Location: [facility];  Service: General;  Laterality: N/A;   SKIN GRAFT  1981   Social History Main Topics   Smoking status: Former Smoker -- 0.50 packs/day for 40 years    Types: Cigarettes, Pipe, Cigars    Quit date: 05/11/1999   Smokeless tobacco: Never Used   Alcohol Use: No   Drug Use: No   Social History Narrative   Married    Retired: [employer]   3 grown children    Current Outpatient Medications on File Prior to Visit  Medication Sig Dispense Refill   acetaminophen  (TYLENOL ) 500 MG tablet Take 1,000 mg by mouth every 6 (six) hours as needed for mild pain, fever or headache.     ascorbic acid  (VITAMIN C) 500 MG tablet Take 1 tablet (500 mg total) by mouth daily. 30 tablet 0   atorvastatin  (LIPITOR) 20 MG tablet Take 20 mg by mouth at bedtime.      Continuous Blood Gluc Receiver (FREESTYLE LIBRE 2 READER) DEVI 1 each by Does not apply route daily. 1 each 0   Continuous Blood Gluc Sensor (FREESTYLE LIBRE 2 SENSOR) MISC 1 each by Does not apply route every 14 (fourteen) days. 6 each 3   glipiZIDE  (GLUCOTROL ) 5 MG tablet TAKE 1 TABLET (5 MG TOTAL) BY  MOUTH TWICE A DAY BEFORE MEALS 180 tablet 1   glucose blood (FREESTYLE PRECISION NEO TEST) test strip Use as instructed to check blood sugar 3 times daily 300 each 3   HUMALOG  KWIKPEN 200 UNIT/ML KwikPen INJECT 8 TO 12 UNITS  SUBCUTANEOUSLY 3 TIMES DAILY  BEFORE MEALS 18 mL 3   insulin  degludec (TRESIBA  FLEXTOUCH) 200 UNIT/ML FlexTouch Pen INJECT 22-24 UNITS  SUBCUTANEOUSLY DAILY     Insulin  Pen Needle (RELION PEN NEEDLES) 32G X 4 MM MISC USE  4 TIMES DAILY WITH  INSULIN  400 each 1   lipase/protease/amylase (CREON) 36000 UNITS CPEP capsule Take 36,000 Units by mouth daily at 6 (six) AM. 2 caps at every meal, 1 cap at snack time (noon)     metoprolol  tartrate (LOPRESSOR ) 25 MG tablet TAKE ONE-HALF TABLET BY MOUTH  TWICE DAILY 180 tablet 2   nitroGLYCERIN  (NITROSTAT ) 0.4 MG SL tablet Place 0.4 mg under the tongue every 5 (five) minutes as needed for chest pain.      OZEMPIC , 0.25 OR 0.5 MG/DOSE, 2 MG/3ML SOPN INJECT SUBCUTANEOUSLY 0.5 MG  EVERY WEEK 9 mL 3   pantoprazole  (PROTONIX ) 40 MG tablet Take 40 mg by mouth daily.     XARELTO  20 MG TABS tablet TAKE 1 TABLET BY MOUTH EVERY DAY 90 tablet 1   No current facility-administered medications on file prior to visit.   No Known Allergies Family History  Problem  Relation Age of Onset   Heart disease Father    Heart attack Father    ALS Mother    Diabetes Sister    Hypertension Sister    Heart failure Brother    Stroke Neg Hx    PE: BP 112/60   Pulse 73   Ht 6' (1.829 m)   Wt 230 lb (104.3 kg)   SpO2 97%   BMI 31.19 kg/m   Wt Readings from Last 10 Encounters:  10/21/23 230 lb (104.3 kg)  10/05/23 230 lb 12.8 oz (104.7 kg)  06/15/23 229 lb 12.8 oz (104.2 kg)  03/30/23 240 lb 6.4 oz (109 kg)  02/09/23 241 lb 12.8 oz (109.7 kg)  10/13/22 247 lb (112 kg)  10/06/22 243 lb (110.2 kg)  10/05/22 241 lb (109.3 kg)  09/28/22 247 lb (112 kg)  06/01/22 244 lb (110.7 kg)   Constitutional: overweight, in NAD Eyes: EOMI, no exophthalmos ENT: no thyromegaly, no cervical lymphadenopathy Cardiovascular: RRR, no MRG Respiratory: CTA B Musculoskeletal: no deformities Skin: no rashes Neurological: no tremor with outstretched hands  ASSESSMENT: 1. DM2, insulin -dependent, uncontrolled, with complications - CAD - CKD stage 3  2. HL  3.  Overweight  PLAN:  1. Patient with longstanding, uncontrolled type 2 diabetes, with improved control after adding a GLP-1 receptor agonist to his basal-insulin  regimen and is sulfonylurea.  We tried to stop sulfonylurea in the past with increasing blood sugars so he is back on it.  At last visit sugars are fluctuating within the target range with only occasional hyperglycemic exceptions mostly after breakfast and lunch.  He was still having prostate flakes for breakfast and we discussed about changing this.  I advised him to reduce the Tresiba  dose as his sugars were trending low overnight.  We did not change the rest of the regimen.  HbA1c at that time was lower, at 6.6%. CGM interpretation: -At today's visit, we reviewed his CGM downloads: It appears that 82% of values are in target range (goal >70%), while 17% are higher than 180 (goal <25%), and  1% are lower than 70 (goal <4%).  The calculated average blood sugar  is 142.  The projected HbA1c for the next 3 months (GMI) is 6.7%. -Reviewing the CGM trends, sugars continue to decrease overnight, but with less lows compared to before.  They are increasing then after breakfast and lunch.  Upon questioning, he is still eating cereals in the morning so he needs a high dose of Humalog  before this meal.  For lunch, he may eat out, in which case he is not taking insulin  before the meal.  We discussed about taking his pens with him and still inject insulin  before the meal.  Will not change the dose with lunch.  After dinner, however, sugars are excellent but they dropped lower throughout the night so for now I advised him to only use insulin  before a larger dinner and to reduce his Tresiba  dose further. -Upon questioning, patient was still using glipizide  twice a day until recently (misunderstood instructions to only use it in the morning at last visit), but he ended up stopping the dose at night again due to low blood sugars overnight.  I strongly advised him not to restart it at night. -I advised him to: Patient Instructions  Please change: - Glipizide  5 mg 15-30 min before b'fast  - Ozempic  0.5 mg weekly - Humalog  15 min before meals 14-16 before b'fast 6-10 before lunch 4 before a large dinner  - Tresiba  18 units daily  Please return in 4 months.   - we checked his HbA1c: 6.8% (slightly higher) - advised to check sugars at different times of the day - 4x a day, rotating check times - advised for yearly eye exams >> he is UTD - will check an ACR today - return to clinic in 4 months  2. HL -Latest lipid panel reviewed from 06/2023: 148/53/58/78 -fractions at goal with the exception of an LDL higher than our goal of less than 55 -He continues on Lipitor 20 mg daily without side effects  3.  Overweight - Currently on Ozempic , which should also help with weight loss.  He was not able to continue Trulicity  due to lack of availability at the pharmacy. - I  previously recommended the patient assistance program for NovoNordisk for Ozempic , but he did not pursue this. - Before last visit, he lost approximately 13 pounds since last visit due to increased activity while taking care of his wife. Weight is approximately stable since then.  Emilie Harden, MD PhD [facility]

## 2023-10-21 NOTE — Addendum Note (Signed)
 Addended by: Vernon Goodpasture on: 10/21/2023 01:13 PM   Modules accepted: Orders

## 2023-10-22 ENCOUNTER — Ambulatory Visit: Payer: Self-pay | Admitting: Internal Medicine

## 2023-10-22 LAB — MICROALBUMIN / CREATININE URINE RATIO
Creatinine, Urine: 160 mg/dL (ref 20–320)
Microalb Creat Ratio: 24 mg/g{creat} (ref <30)
Microalb, Ur: 3.8 mg/dL

## 2023-10-31 ENCOUNTER — Other Ambulatory Visit: Payer: Self-pay | Admitting: Internal Medicine

## 2023-11-01 ENCOUNTER — Other Ambulatory Visit: Payer: Self-pay | Admitting: Cardiovascular Disease

## 2023-11-01 DIAGNOSIS — I480 Paroxysmal atrial fibrillation: Secondary | ICD-10-CM

## 2023-11-01 NOTE — Telephone Encounter (Signed)
 Xarelto  20mg  refill request received. Pt is 78 years old, weight-104.3kg, Crea-1.58 on 06/14/23 via [employer] [employer] [facility], last seen by Raphael Bring on 02/02/23, Diagnosis-Afib, CrCl-56.84 mL/min; Dose is appropriate based on dosing criteria. Will send in refill to requested pharmacy.

## 2023-11-25 ENCOUNTER — Other Ambulatory Visit: Payer: Self-pay | Admitting: Cardiovascular Disease

## 2023-12-02 ENCOUNTER — Other Ambulatory Visit: Payer: Self-pay | Admitting: Internal Medicine

## 2023-12-05 NOTE — Telephone Encounter (Signed)
 Refill request complete

## 2023-12-11 ENCOUNTER — Other Ambulatory Visit: Payer: Self-pay | Admitting: Internal Medicine

## 2023-12-11 NOTE — Progress Notes (Unsigned)
 No chief complaint on file.   History of Present Illness: 78 yo male with history of CAD, HTN, hyperlipidemia, persistent atrial fibrillation, AAA and DM who is here today for cardiac follow up. In 2001 he had an anterior STEMI treated with a bare-metal stent in the LAD. He underwent catheterization in 2006 and the stent was patent and there was an 80% narrowing in the diagonal branch which was treated medically. He was noted to be in atrial fibrillation in October 2013 and was started on Xarelto  and DCCV was arranged on 03/27/12 with return to sinus rhythm. He has since converted back to atrial fibrillation and has been rate controlled on metoprolol .  AAA followed in [facility]. Last u/s May 2025 with 4.6 cm AAA. Echo July 2018 with normal LV size and function with no significant valve disease. 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 He had colostomy reversal in October 2019. He had Covid pneumonia in February 2021. Atrial fib with RVR while hospitalized.   He is here today for follow up. The patient denies any chest pain, dyspnea, palpitations, lower extremity edema, orthopnea, PND, dizziness, near syncope or syncope.     Primary Care Physician: Montey Lot, PA-C  Past Medical History:  Diagnosis Date   AAA (abdominal aortic aneurysm) [facility])    Allergy    Atrial fibrillation [facility]) October 2013   Xarelto  started; duration unknown   Cataract    Chronic kidney disease    Stage III    Coronary artery disease    post bare-metal stent to LAD in 2001   Diabetes mellitus    type 2    Hypertension    Myocardial infarct [facility]) 2001   Obesity     Past Surgical History:  Procedure Laterality Date   CARDIAC CATHETERIZATION  2006   stable with primarily nonobstructive disease   CARDIOVERSION  03/27/2012   Procedure: CARDIOVERSION;  Surgeon: Reyes JONETTA Forget, MD;  Location: [facility] ENDOSCOPY;  Service: Cardiovascular;  Laterality: N/A;   COLOSTOMY TAKEDOWN N/A  03/08/2018   Procedure: LAPAROSCOPIC HARTMANNS REVERSAL;  Surgeon: Teresa Lonni HERO, MD;  Location: [facility] ORS;  Service: General;  Laterality: N/A;   CORONARY ANGIOPLASTY WITH STENT PLACEMENT  2001   bare-metal stent to the LAD   CYSTOSCOPY W/ URETERAL STENT PLACEMENT Bilateral 03/08/2018   Procedure: CYSTOSCOPY WITH RETROGRADE AND BILATERAL URETERAL STENT PLACEMENT;  Surgeon: Carolee Sherwood JONETTA DOUGLAS, MD;  Location: [facility] ORS;  Service: Urology;  Laterality: Bilateral;   CYSTOSCOPY WITH STENT PLACEMENT Bilateral 06/03/2017   Procedure: CYSTOSCOPY WITH STENT PLACEMENT;  Surgeon: Nieves Cough, MD;  Location: [facility];  Service: Urology;  Laterality: Bilateral;   FLEXIBLE SIGMOIDOSCOPY N/A 03/08/2018   Procedure: FLEXIBLE SIGMOIDOSCOPY;  Surgeon: Teresa Lonni HERO, MD;  Location: [facility];  Service: General;  Laterality: N/A;   HERNIA REPAIR  2006   LAPAROSCOPIC PARASTOMAL HERNIA N/A 03/08/2018   Procedure: PARASTOMAL HERNIA REPAIR;  Surgeon: Teresa Lonni HERO, MD;  Location: [facility];  Service: General;  Laterality: N/A;   LAPAROSCOPIC SIGMOID COLECTOMY N/A 06/03/2017   Procedure: LAPAROSCOPIC  SIGMOID COLECTOMY, COLOSTOMY;  Surgeon: Teresa Lonni HERO, MD;  Location: [facility];  Service: General;  Laterality: N/A;   SKIN GRAFT  1981    Current Outpatient Medications  Medication Sig Dispense Refill   acetaminophen  (TYLENOL ) 500 MG tablet Take 1,000 mg by mouth every 6 (six) hours as needed for mild pain, fever or headache.     ascorbic acid  (  VITAMIN C) 500 MG tablet Take 1 tablet (500 mg total) by mouth daily. 30 tablet 0   atorvastatin  (LIPITOR) 20 MG tablet Take 20 mg by mouth at bedtime.  (Patient not taking: Reported on 10/21/2023)     Continuous Blood Gluc Receiver (FREESTYLE LIBRE 2 READER) DEVI 1 each by Does not apply route daily. 1 each 0   Continuous Blood Gluc Sensor (FREESTYLE LIBRE 2 SENSOR) MISC 1 each by Does not apply route every 14 (fourteen) days. 6 each 3   glipiZIDE  (GLUCOTROL ) 5  MG tablet Take 1 tablet (5 mg total) by mouth daily before breakfast. 180 tablet 1   glucose blood (FREESTYLE PRECISION NEO TEST) test strip Use as instructed to check blood sugar 3 times daily 300 each 3   HUMALOG  KWIKPEN 200 UNIT/ML KwikPen INJECT 8 TO 12 UNITS  SUBCUTANEOUSLY 3 TIMES DAILY  BEFORE MEALS (Patient taking differently: INJECT 12 TO 16 UNITS  SUBCUTANEOUSLY 3 TIMES  DAILY BEFORE MEALS) 18 mL 3   insulin  degludec (TRESIBA  FLEXTOUCH) 200 UNIT/ML FlexTouch Pen INJECT 18 UNITS SUBCUTANEOUSLY  DAILY 18 mL 0   Insulin  Pen Needle (RELION PEN NEEDLES) 32G X 4 MM MISC USE  4 TIMES DAILY WITH  INSULIN  400 each 1   lipase/protease/amylase (CREON) 36000 UNITS CPEP capsule Take 36,000 Units by mouth daily at 6 (six) AM. 2 caps at every meal, 1 cap at snack time (noon)     metoprolol  tartrate (LOPRESSOR ) 25 MG tablet TAKE ONE-HALF TABLET BY MOUTH  TWICE DAILY 90 tablet 0   nitroGLYCERIN  (NITROSTAT ) 0.4 MG SL tablet Place 0.4 mg under the tongue every 5 (five) minutes as needed for chest pain.      OZEMPIC , 0.25 OR 0.5 MG/DOSE, 2 MG/3ML SOPN INJECT SUBCUTANEOUSLY 0.5 MG  EVERY WEEK 9 mL 3   pantoprazole  (PROTONIX ) 40 MG tablet Take 40 mg by mouth daily.     XARELTO  20 MG TABS tablet TAKE 1 TABLET BY MOUTH EVERY DAY 90 tablet 1   No current facility-administered medications for this visit.    No Known Allergies  Social History   Socioeconomic History   Marital status: Married    Spouse name: Not on file   Number of children: Not on file   Years of education: Not on file   Highest education level: Not on file  Occupational History   Not on file  Tobacco Use   Smoking status: Former    Current packs/day: 0.00    Average packs/day: 0.5 packs/day for 40.0 years (20.0 ttl pk-yrs)    Types: Cigarettes, Pipe, Cigars    Start date: 05/11/1959    Quit date: 05/11/1999    Years since quitting: 24.6    Passive exposure: Never   Smokeless tobacco: Never  Vaping Use   Vaping status: Never Used   Substance and Sexual Activity   Alcohol use: No   Drug use: No   Sexual activity: Yes  Other Topics Concern   Not on file  Social History Narrative   Married    Retired: [employer]   3 grown children   Social Drivers of Corporate investment banker Strain: Not on file  Food Insecurity: Not on file  Transportation Needs: Not on file  Physical Activity: Not on file  Stress: Not on file  Social Connections: Not on file  Intimate Partner Violence: Not on file    Family History  Problem Relation Age of Onset   Heart disease Father    Heart attack  Father    ALS Mother    Diabetes Sister    Hypertension Sister    Heart failure Brother    Stroke Neg Hx     Review of Systems:  As stated in the HPI and otherwise negative.   There were no vitals taken for this visit.  Physical Examination: General: Well developed, well nourished, NAD  HEENT: OP clear, mucus membranes moist  SKIN: warm, dry. No rashes. Neuro: No focal deficits  Musculoskeletal: Muscle strength 5/5 all ext  Psychiatric: Mood and affect normal  Neck: No JVD, no carotid bruits, no thyromegaly, no lymphadenopathy.  Lungs:Clear bilaterally, no wheezes, rhonci, crackles Cardiovascular: Regular rate and rhythm. No murmurs, gallops or rubs. Abdomen:Soft. Bowel sounds present. Non-tender.  Extremities: No lower extremity edema. Pulses are 2 + in the bilateral DP/PT.  EKG:  EKG is *** ordered today. The ekg ordered today demonstrates   Recent Labs: No results found for requested labs within last 365 days.   Lipid Panel Followed in primary care   Wt Readings from Last 3 Encounters:  10/21/23 230 lb (104.3 kg)  10/05/23 230 lb 12.8 oz (104.7 kg)  06/15/23 229 lb 12.8 oz (104.2 kg)     Assessment and Plan:   1. CAD without angina: No chest pain. Continue beat blocker and statin. He has not been on ASA since he is on Xarelto . Lipids followed in primary care. Last LDL ***  2. Atrial fibrillation, persistent: Rate  controlled atrial fib today. Continue Lopressor  and Xarelto . CHADS VASC score 5.        3. HYPERTENSION: BP is controlled. Continue current therapy   Labs/ tests ordered today include: No orders of the defined types were placed in this encounter.  Disposition:   F/U with me in 12 months  Signed, Lonni Cash, MD 12/11/2023 4:39 PM    [facility] [address] [place], [place], [place]  [address] Phone: [phone]; Fax: [phone]

## 2023-12-12 ENCOUNTER — Encounter: Payer: Self-pay | Admitting: Cardiovascular Disease

## 2023-12-12 ENCOUNTER — Ambulatory Visit: Attending: Cardiovascular Disease | Admitting: Cardiovascular Disease

## 2023-12-12 VITALS — BP 110/65 | HR 72 | Resp 16 | Ht 72.0 in | Wt 230.7 lb

## 2023-12-12 DIAGNOSIS — I10 Essential (primary) hypertension: Secondary | ICD-10-CM | POA: Diagnosis not present

## 2023-12-12 DIAGNOSIS — I714 Abdominal aortic aneurysm, without rupture, unspecified: Secondary | ICD-10-CM | POA: Diagnosis not present

## 2023-12-12 DIAGNOSIS — I2510 Atherosclerotic heart disease of native coronary artery without angina pectoris: Secondary | ICD-10-CM

## 2023-12-12 DIAGNOSIS — I4819 Other persistent atrial fibrillation: Secondary | ICD-10-CM | POA: Diagnosis not present

## 2023-12-12 NOTE — Patient Instructions (Signed)
 Medication Instructions:  No changes *If you need a refill on your cardiac medications before your next appointment, please call your pharmacy*  Lab Work: none If you have labs (blood work) drawn today and your tests are completely normal, you will receive your results only by: MyChart Message (if you have MyChart) OR A paper copy in the mail If you have any lab test that is abnormal or we need to change your treatment, we will call you to review the results.  Testing/Procedures: none  Follow-Up: At [facility], you and your health needs are our priority.  As part of our continuing mission to provide you with exceptional heart care, our providers are all part of one team.  This team includes your primary Cardiologist (physician) and Advanced Practice Providers or APPs (Physician Assistants and Nurse Practitioners) who all work together to provide you with the care you need, when you need it.  Your next appointment:   12 month(s)  Provider:   Antoinette Batman, MD

## 2023-12-14 LAB — LAB REPORT - SCANNED: EGFR: 36

## 2024-02-14 ENCOUNTER — Other Ambulatory Visit: Payer: Self-pay | Admitting: Cardiovascular Disease

## 2024-02-14 ENCOUNTER — Other Ambulatory Visit: Payer: Self-pay | Admitting: Internal Medicine

## 2024-02-28 ENCOUNTER — Encounter: Payer: Self-pay | Admitting: Internal Medicine

## 2024-02-28 ENCOUNTER — Ambulatory Visit: Admitting: Internal Medicine

## 2024-02-28 VITALS — BP 118/60 | HR 78 | Ht 72.0 in | Wt 230.4 lb

## 2024-02-28 DIAGNOSIS — E1165 Type 2 diabetes mellitus with hyperglycemia: Secondary | ICD-10-CM | POA: Diagnosis not present

## 2024-02-28 DIAGNOSIS — E663 Overweight: Secondary | ICD-10-CM | POA: Diagnosis not present

## 2024-02-28 DIAGNOSIS — Z7985 Long-term (current) use of injectable non-insulin antidiabetic drugs: Secondary | ICD-10-CM

## 2024-02-28 DIAGNOSIS — Z794 Long term (current) use of insulin: Secondary | ICD-10-CM | POA: Diagnosis not present

## 2024-02-28 DIAGNOSIS — E1159 Type 2 diabetes mellitus with other circulatory complications: Secondary | ICD-10-CM | POA: Diagnosis not present

## 2024-02-28 DIAGNOSIS — E7800 Pure hypercholesterolemia, unspecified: Secondary | ICD-10-CM

## 2024-02-28 DIAGNOSIS — Z7984 Long term (current) use of oral hypoglycemic drugs: Secondary | ICD-10-CM | POA: Diagnosis not present

## 2024-02-28 LAB — POCT GLYCOSYLATED HEMOGLOBIN (HGB A1C): Hemoglobin A1C: 7.3 % — AB (ref 4.0–5.6)

## 2024-02-28 NOTE — Progress Notes (Signed)
 Patient ID: [name], male   DOB: 1945/05/21, 78 y.o.   MRN: [id number]  HPI: [name] is a 78 y.o.-year-old male, returning for f/u for DM2, dx 1995, insulin -dependent, uncontrolled, with complications (CKD stage 3, CAD - AMI, s/p stent LAD 2001). Last visit 4 months ago.  He is here with his wife, who is also my patient, and offers part of the history especially related to his blood sugars, insulin  doses, and past medical history.  Interim history: No increased urination, blurry vision, nausea, chest pain.   He was diagnosed with pancreatic insufficiency by Dr. Donnald - on Creon.  He continues to have occasional diarrhea.  Reviewed HbA1c levels: Lab Results  Component Value Date   HGBA1C 6.8 (A) 10/21/2023   HGBA1C 6.6 (A) 06/15/2023   HGBA1C 7.3 (A) 02/09/2023   He is on: - Trulicity  1.5  mg weekly >> 3 >> 4.5 >> 3 mg weekly >> Ozempic  0.5 mg weekly - Glipizide  5 mg 15-30 min before b'fast and dinner >> 5 mg before b'fast >> 5 mg 2x a day >> 5 mg before breakfast only >> but he takes it 5 mg 2x a day - Tresiba  32 >> 28 >> 22 >> 18 units daily - Humalog  >> 12-15 before b'fast >> 12 >> 14-16 >> 16 6-10 before lunch >> 0-8 >> 6-10 (but not taking it when eating out) - may skip lunch 8 >> 4-8 >> 4-6 >> 4 >> but taking 4-10 before dinner He was on the insulin  70/30 before admission for COVID-19 + pneumonia in 05/2019.  We stopped this in 12/2020. We tried low-dose metformin  ER several times, but he could not tolerate it due to diarrhea >> now diarrhea is better He was on Tanzeum  50 mg weekly (started 09/2015) - >200$ per month! >> Trulicity  1.5 mg weekly He was previously on glipizide -metformin  but could not tolerate it due to diarrhea -stopped 08/2017  Pt checks his sugars >4x a day with his freestyle libre 2 CGM:  Previously:  Previously:   Lowest sugar was 47 >> ...  51 >> 55 >> 69; he does not have hypoglycemia awareness! Highest sugar was  280s >> 200s >>  200s.  Pt's meals are: - Breakfast: eggs, toast, ham/sausage or cereals + milk >> still frosted flakes with milk 1-2 bowls; grits - Lunch: sandwich, vegetables, meat - Dinner: seafood, meat, vegetables - Snacks: nabs  Reviewed previous labs: 12/13/2023:  Lipids: 138/53/48/78  Glucose 64, BUN/creatinine 21/1.88, GFR 36  06/14/2023:  Lipids: 148/53/58/78 Glucose 82, BUN/creatinine 19/1.58, GFR 45  06/09/2022: Lipids: 122/50/44/66  Glucose 91, BUN/creatinine 20/1.71, GFR 41 Vitamin B12 299  12/01/2021: Lipids: 130/62/44/73  Glucose 134, BUN/creatinine 22/1.74, GFR 40  05/22/2020: Lipids: 140/50/53/75 Glucose 131, BUN/creatinine 16/1.68, GFR 42  -He has CKD stage III.  -+ HL.  On Lipitor 20.  He is on Neurontin .  - last eye exam was on 2025: No DR.   - no numbness and tingling in his feet. He is also seen [facility]. Last foot exam: By Dr. Dulcie ([facility]) on 10/18/2023.  He has an AAA -increased from 4.4 cm in 2018 to 4.7 cm now (CT scan from 03/25/2022). He has a history of diverticulitis (saw Dr. Donnald).  He had a colectomy in 05/2017 and had to have a colostomy bag.  He was feeling better after his colectomy.  He had hernia repair surgery and reversal of his ostomy 03/08/2018. He had COVID-19 complicated with pneumonia in 05/2019.  At that time, he was admitted with A. fib with RVR and was treated with remdesivir , convalescent plasma, dexamethasone .   ROS: + see HPI  I reviewed pt's medications, allergies, PMH, social hx, family hx, and changes were documented in the history of present illness. Otherwise, unchanged from my initial visit note.  Past Medical History:  Diagnosis Date   AAA (abdominal aortic aneurysm)    Allergy    Atrial fibrillation [facility]) October 2013   Xarelto  started; duration unknown   Cataract    Chronic kidney disease    Stage III    Coronary artery disease    post bare-metal stent to LAD in 2001   Diabetes mellitus     type 2    Hypertension    Myocardial infarct [facility]) 2001   Obesity    Past Surgical History:  Procedure Laterality Date   CARDIAC CATHETERIZATION  2006   stable with primarily nonobstructive disease   CARDIOVERSION  03/27/2012   Procedure: CARDIOVERSION;  Surgeon: Richard JONETTA Forget, MD;  Location: [facility] ENDOSCOPY;  Service: Cardiovascular;  Laterality: N/A;   COLOSTOMY TAKEDOWN N/A 03/08/2018   Procedure: LAPAROSCOPIC HARTMANNS REVERSAL;  Surgeon: Richard Lonni HERO, MD;  Location: [facility];  Service: General;  Laterality: N/A;   CORONARY ANGIOPLASTY WITH STENT PLACEMENT  2001   bare-metal stent to the LAD   CYSTOSCOPY W/ URETERAL STENT PLACEMENT Bilateral 03/08/2018   Procedure: CYSTOSCOPY WITH RETROGRADE AND BILATERAL URETERAL STENT PLACEMENT;  Surgeon: Richard Sherwood JONETTA DOUGLAS, MD;  Location: [facility];  Service: Urology;  Laterality: Bilateral;   CYSTOSCOPY WITH STENT PLACEMENT Bilateral 06/03/2017   Procedure: CYSTOSCOPY WITH STENT PLACEMENT;  Surgeon: Richard Cough, MD;  Location: [facility];  Service: Urology;  Laterality: Bilateral;   FLEXIBLE SIGMOIDOSCOPY N/A 03/08/2018   Procedure: FLEXIBLE SIGMOIDOSCOPY;  Surgeon: Richard Lonni HERO, MD;  Location: [facility];  Service: General;  Laterality: N/A;   HERNIA REPAIR  2006   LAPAROSCOPIC PARASTOMAL HERNIA N/A 03/08/2018   Procedure: PARASTOMAL HERNIA REPAIR;  Surgeon: Richard Lonni HERO, MD;  Location: [facility];  Service: General;  Laterality: N/A;   LAPAROSCOPIC SIGMOID COLECTOMY N/A 06/03/2017   Procedure: LAPAROSCOPIC  SIGMOID COLECTOMY, COLOSTOMY;  Surgeon: Richard Lonni HERO, MD;  Location: [facility];  Service: General;  Laterality: N/A;   SKIN GRAFT  1981   Social History Main Topics   Smoking status: Former Smoker -- 0.50 packs/day for 40 years    Types: Cigarettes, Pipe, Cigars    Quit date: 05/11/1999   Smokeless tobacco: Never Used   Alcohol Use: No   Drug Use: No   Social History Narrative   Married    Retired: [employer]   3 grown  children   Current Outpatient Medications on File Prior to Visit  Medication Sig Dispense Refill   acetaminophen  (TYLENOL ) 500 MG tablet Take 1,000 mg by mouth every 6 (six) hours as needed for mild pain, fever or headache.     ascorbic acid  (VITAMIN C) 500 MG tablet Take 1 tablet (500 mg total) by mouth daily. 30 tablet 0   atorvastatin  (LIPITOR) 20 MG tablet Take 20 mg by mouth at bedtime.  (Patient not taking: Reported on 12/12/2023)     Continuous Blood Gluc Receiver (FREESTYLE LIBRE 2 READER) DEVI 1 each by Does not apply route daily. 1 each 0   Continuous Blood Gluc Sensor (FREESTYLE LIBRE 2 SENSOR) MISC 1 each by Does not apply route every 14 (fourteen) days. 6 each 3   glipiZIDE  (GLUCOTROL ) 5 MG  tablet Take 1 tablet (5 mg total) by mouth daily before breakfast. 180 tablet 1   glucose blood (FREESTYLE PRECISION NEO TEST) test strip Use as instructed to check blood sugar 3 times daily 300 each 3   HUMALOG  KWIKPEN 200 UNIT/ML KwikPen INJECT 8 TO 12 UNITS  SUBCUTANEOUSLY 3 TIMES DAILY  BEFORE MEALS (Patient taking differently: INJECT 12 TO 16 UNITS  SUBCUTANEOUSLY 3 TIMES  DAILY BEFORE MEALS) 18 mL 3   insulin  degludec (TRESIBA  FLEXTOUCH) 200 UNIT/ML FlexTouch Pen INJECT 18 UNITS SUBCUTANEOUSLY  DAILY 18 mL 0   Insulin  Pen Needle (RELION PEN NEEDLES) 32G X 4 MM MISC USE 4 TIMES DAILY WITH  INSULIN  200 each 5   lipase/protease/amylase (CREON) 36000 UNITS CPEP capsule Take 36,000 Units by mouth daily at 6 (six) AM. 2 caps at every meal, 1 cap at snack time (noon)     metoprolol  tartrate (LOPRESSOR ) 25 MG tablet TAKE ONE-HALF TABLET BY MOUTH  TWICE DAILY 90 tablet 3   nitroGLYCERIN  (NITROSTAT ) 0.4 MG SL tablet Place 0.4 mg under the tongue every 5 (five) minutes as needed for chest pain.      OZEMPIC , 0.25 OR 0.5 MG/DOSE, 2 MG/3ML SOPN INJECT SUBCUTANEOUSLY 0.5 MG  EVERY WEEK 9 mL 3   pantoprazole  (PROTONIX ) 40 MG tablet Take 40 mg by mouth daily.     XARELTO  20 MG TABS tablet TAKE 1 TABLET BY  MOUTH EVERY DAY 90 tablet 1   No current facility-administered medications on file prior to visit.   No Known Allergies Family History  Problem Relation Age of Onset   ALS Mother    Heart disease Father    Heart attack Father    Diabetes Sister    Hypertension Sister    Heart failure Brother    Stroke Neg Hx    PE: BP 118/60   Pulse 78   Ht 6' (1.829 m)   Wt 230 lb 6.4 oz (104.5 kg)   SpO2 95%   BMI 31.25 kg/m   Wt Readings from Last 10 Encounters:  02/28/24 230 lb 6.4 oz (104.5 kg)  12/12/23 230 lb 11.2 oz (104.6 kg)  10/21/23 230 lb (104.3 kg)  10/05/23 230 lb 12.8 oz (104.7 kg)  06/15/23 229 lb 12.8 oz (104.2 kg)  03/30/23 240 lb 6.4 oz (109 kg)  02/09/23 241 lb 12.8 oz (109.7 kg)  10/13/22 247 lb (112 kg)  10/06/22 243 lb (110.2 kg)  10/05/22 241 lb (109.3 kg)   Constitutional: overweight, in NAD Eyes: EOMI, no exophthalmos ENT: no thyromegaly, no cervical lymphadenopathy Cardiovascular: RRR, no MRG Respiratory: CTA B Musculoskeletal: no deformities Skin: no rashes Neurological: no tremor with outstretched hands  ASSESSMENT: 1. DM2, insulin -dependent, uncontrolled, with complications - CAD - CKD stage 3  2. HL  3.  Overweight  PLAN:  1. Patient with longstanding, uncontrolled, type 2 diabetes, with improved control after adding the GLP-1 receptor agonist and his basal/bolus insulin  regimen.  He continues on sulfonylurea, also.  We did try to stop this but sugars increased so he restarted it.  At last visit, he still had a decrease in blood sugars overnight, but with less lows compared to before.  They were increasing after breakfast and lunch.  He was still eating cereals in the morning so he needs a high dose of Humalog  before this meal.  He was occasionally eating lunch out and was not taking insulin  before the meal.  We discussed about taking the pens with him so he could  bolus before the meal.  I advised him to stop the glipizide  at night and only  continue with the morning dose, we also decreased the dose of Tresiba  and the dose of Humalog  before dinner to avoid dropping his blood sugars too low at night.  HbA1c at last visit was 6.8%, slightly higher than before CGM interpretation: -At today's visit, we reviewed his CGM downloads: It appears that 76% of values are in target range (goal >70%), while 24% are higher than 180 (goal <25%), and 0% are lower than 70 (goal <4%).  The calculated average blood sugar is 156.  The projected HbA1c for the next 3 months (GMI) is 7.0%. -Reviewing the CGM trends, sugars appear to be improving overnight and then increasing after every meal, but particularly after lunch.  Upon questioning, he is either eating a smaller lunch and not bolusing for it, or eating out and not taking his insulin  with him.  We discussed that if he is eating lunch, he needs to bolus for this.  I advised him to use between 6-10 units of insulin  for this meal.  For dinner, he is taking too much insulin , between 4 and 10 units.  I advised him not to take more than 6 units to avoid drops in blood sugars overnight.  He is still taking the glipizide  twice a day, with a second dose before dinner despite repeated advised to try not to take it in the evening.  For now, since he does not have any more low blood sugars, we will continue this. -I advised him to: Patient Instructions  Please change: - Glipizide  5 mg 2x a day 15-30 min before meals - Ozempic  0.5 mg weekly - Humalog  15 min before meals 16 before b'fast 6-10 before lunch 4-6 before dinner  - Tresiba  18 units daily  Please return in 4 months.   - we checked his HbA1c: 7.3% (higher) - advised to check sugars at different times of the day - 4x a day, rotating check times - advised for yearly eye exams >> he is UTD - return to clinic in 4 months  2. HL - Reviewed latest lipid panel from 12/13/2023: 138/53/48/78 -fractions at goal with the exception of an LDL higher than our goal  of less than 55  - He continues on Lipitor 20 mg daily without side effect  3.  Overweight - Currently on Ozempic , which should also help with weight loss.  He was not able to continue Trulicity  due to lack of availability at the pharmacy. - I previously recommended the patient assistance program for NovoNordisk for Ozempic , but he did not pursue this. - Weight was stable at last visit and remains stable today  Lela Fendt, MD PhD [facility] Endocrinology

## 2024-02-28 NOTE — Patient Instructions (Addendum)
 Please change: - Glipizide  5 mg 2x a day 15-30 min before meals - Ozempic  0.5 mg weekly - Humalog  15 min before meals 16 before b'fast 6-10 before lunch 4-6 before dinner  - Tresiba  18 units daily  Please return in 4 months.

## 2024-03-24 ENCOUNTER — Other Ambulatory Visit: Payer: Self-pay | Admitting: Internal Medicine

## 2024-04-11 ENCOUNTER — Telehealth: Payer: Self-pay

## 2024-04-11 DIAGNOSIS — E1159 Type 2 diabetes mellitus with other circulatory complications: Secondary | ICD-10-CM

## 2024-04-11 MED ORDER — FREESTYLE LIBRE 3 READER DEVI: 0 refills | Status: AC

## 2024-04-11 MED ORDER — FREESTYLE LIBRE 3 PLUS SENSOR MISC: 1.0000 | 3 refills | Status: AC

## 2024-04-11 NOTE — Telephone Encounter (Signed)
 Patient called stating that the DME company has changed from [place] to [employer].   Fax number:[phone]

## 2024-04-20 ENCOUNTER — Other Ambulatory Visit: Payer: Self-pay | Admitting: Cardiovascular Disease

## 2024-04-20 DIAGNOSIS — I480 Paroxysmal atrial fibrillation: Secondary | ICD-10-CM

## 2024-04-20 NOTE — Telephone Encounter (Signed)
 Prescription refill request for Xarelto  received.  Indication: a fib Last office visit: 12/12/23 Weight: 230# Age: 78 Scr: 1.88 [facility] 12/14/23 CrCl: 48 ml/min  Contacted patient and spoke with wife to let him know we are reducing Xarelto  to 15mg  once daily

## 2024-04-24 ENCOUNTER — Other Ambulatory Visit: Payer: Self-pay | Admitting: Internal Medicine

## 2024-04-24 DIAGNOSIS — E1159 Type 2 diabetes mellitus with other circulatory complications: Secondary | ICD-10-CM

## 2024-04-24 NOTE — Telephone Encounter (Signed)
 RX addended to reflect most recent notes

## 2024-05-31 LAB — OPHTHALMOLOGY REPORT-SCANNED

## 2024-07-03 ENCOUNTER — Ambulatory Visit: Admitting: Internal Medicine
